# Patient Record
Sex: Male | Born: 1990
Health system: Southern US, Community
[De-identification: ages and names within clinical notes are randomized; demographics above are authoritative.]

## PROBLEM LIST (undated history)

## (undated) DIAGNOSIS — E785 Hyperlipidemia, unspecified: Secondary | ICD-10-CM

## (undated) DIAGNOSIS — Z7982 Long term (current) use of aspirin: Secondary | ICD-10-CM

## (undated) DIAGNOSIS — E119 Type 2 diabetes mellitus without complications: Secondary | ICD-10-CM

## (undated) DIAGNOSIS — J45909 Unspecified asthma, uncomplicated: Secondary | ICD-10-CM

## (undated) DIAGNOSIS — I213 ST elevation (STEMI) myocardial infarction of unspecified site: Secondary | ICD-10-CM

## (undated) DIAGNOSIS — I514 Myocarditis, unspecified: Secondary | ICD-10-CM

## (undated) DIAGNOSIS — I428 Other cardiomyopathies: Secondary | ICD-10-CM

## (undated) DIAGNOSIS — N184 Chronic kidney disease, stage 4 (severe): Secondary | ICD-10-CM

## (undated) DIAGNOSIS — I5032 Chronic diastolic (congestive) heart failure: Secondary | ICD-10-CM

## (undated) DIAGNOSIS — D509 Iron deficiency anemia, unspecified: Secondary | ICD-10-CM

## (undated) DIAGNOSIS — I4510 Unspecified right bundle-branch block: Secondary | ICD-10-CM

## (undated) DIAGNOSIS — I5020 Unspecified systolic (congestive) heart failure: Secondary | ICD-10-CM

## (undated) DIAGNOSIS — Z6841 Body Mass Index (BMI) 40.0 and over, adult: Secondary | ICD-10-CM

## (undated) DIAGNOSIS — N182 Chronic kidney disease, stage 2 (mild): Secondary | ICD-10-CM

## (undated) DIAGNOSIS — I472 Ventricular tachycardia, unspecified: Secondary | ICD-10-CM

## (undated) DIAGNOSIS — I2510 Atherosclerotic heart disease of native coronary artery without angina pectoris: Secondary | ICD-10-CM

## (undated) DIAGNOSIS — I10 Essential (primary) hypertension: Secondary | ICD-10-CM

## (undated) DIAGNOSIS — Z8709 Personal history of other diseases of the respiratory system: Secondary | ICD-10-CM

## (undated) HISTORY — DX: Chronic diastolic (congestive) heart failure: I50.32

## (undated) HISTORY — DX: Body Mass Index (BMI) 40.0 and over, adult: Z684

## (undated) HISTORY — DX: Atherosclerotic heart disease of native coronary artery without angina pectoris: I25.10

## (undated) HISTORY — PX: CARDIAC CATHETERIZATION: SHX172

## (undated) HISTORY — DX: Unspecified systolic (congestive) heart failure: I50.20

## (undated) HISTORY — DX: Ventricular tachycardia, unspecified: I47.20

## (undated) HISTORY — DX: Iron deficiency anemia, unspecified: D50.9

## (undated) HISTORY — DX: Other cardiomyopathies: I42.8

## (undated) HISTORY — DX: Chronic kidney disease, stage 2 (mild): N18.2

## (undated) HISTORY — DX: Chronic kidney disease, stage 4 (severe): N18.4

## (undated) HISTORY — DX: Morbid (severe) obesity due to excess calories: E66.01

## (undated) HISTORY — DX: Myocarditis, unspecified: I51.4

## (undated) HISTORY — DX: Personal history of other diseases of the respiratory system: Z87.09

---
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
## 2021-01-12 ENCOUNTER — Encounter: Payer: Self-pay | Admitting: Emergency Medicine

## 2021-01-12 ENCOUNTER — Inpatient Hospital Stay: Payer: HRSA Program

## 2021-01-12 ENCOUNTER — Emergency Department: Payer: HRSA Program

## 2021-01-12 ENCOUNTER — Inpatient Hospital Stay
Admission: EM | Admit: 2021-01-12 | Discharge: 2021-02-03 | DRG: 207 | Disposition: A | Discharge status: Rehabilitation Facility | Payer: HRSA Program | Attending: Internal Medicine | Admitting: Internal Medicine

## 2021-01-12 DIAGNOSIS — J15211 Pneumonia due to Methicillin susceptible Staphylococcus aureus: Secondary | ICD-10-CM | POA: Diagnosis present

## 2021-01-12 DIAGNOSIS — R7989 Other specified abnormal findings of blood chemistry: Secondary | ICD-10-CM

## 2021-01-12 DIAGNOSIS — R34 Anuria and oliguria: Secondary | ICD-10-CM | POA: Diagnosis not present

## 2021-01-12 DIAGNOSIS — E870 Hyperosmolality and hypernatremia: Secondary | ICD-10-CM | POA: Diagnosis present

## 2021-01-12 DIAGNOSIS — J1282 Pneumonia due to coronavirus disease 2019: Secondary | ICD-10-CM | POA: Diagnosis present

## 2021-01-12 DIAGNOSIS — E66813 Obesity, class 3: Secondary | ICD-10-CM | POA: Diagnosis present

## 2021-01-12 DIAGNOSIS — I2119 ST elevation (STEMI) myocardial infarction involving other coronary artery of inferior wall: Secondary | ICD-10-CM | POA: Diagnosis not present

## 2021-01-12 DIAGNOSIS — D649 Anemia, unspecified: Secondary | ICD-10-CM | POA: Diagnosis not present

## 2021-01-12 DIAGNOSIS — E1110 Type 2 diabetes mellitus with ketoacidosis without coma: Secondary | ICD-10-CM | POA: Diagnosis present

## 2021-01-12 DIAGNOSIS — I472 Ventricular tachycardia, unspecified: Secondary | ICD-10-CM

## 2021-01-12 DIAGNOSIS — U071 COVID-19: Principal | ICD-10-CM | POA: Diagnosis present

## 2021-01-12 DIAGNOSIS — K567 Ileus, unspecified: Secondary | ICD-10-CM

## 2021-01-12 DIAGNOSIS — J80 Acute respiratory distress syndrome: Secondary | ICD-10-CM | POA: Diagnosis present

## 2021-01-12 DIAGNOSIS — G9341 Metabolic encephalopathy: Secondary | ICD-10-CM | POA: Diagnosis present

## 2021-01-12 DIAGNOSIS — I5031 Acute diastolic (congestive) heart failure: Secondary | ICD-10-CM | POA: Diagnosis present

## 2021-01-12 DIAGNOSIS — J9690 Respiratory failure, unspecified, unspecified whether with hypoxia or hypercapnia: Secondary | ICD-10-CM

## 2021-01-12 DIAGNOSIS — Z452 Encounter for adjustment and management of vascular access device: Secondary | ICD-10-CM

## 2021-01-12 DIAGNOSIS — E785 Hyperlipidemia, unspecified: Secondary | ICD-10-CM | POA: Diagnosis present

## 2021-01-12 DIAGNOSIS — G928 Other toxic encephalopathy: Secondary | ICD-10-CM | POA: Diagnosis present

## 2021-01-12 DIAGNOSIS — E1100 Type 2 diabetes mellitus with hyperosmolarity without nonketotic hyperglycemic-hyperosmolar coma (NKHHC): Secondary | ICD-10-CM

## 2021-01-12 DIAGNOSIS — J45901 Unspecified asthma with (acute) exacerbation: Secondary | ICD-10-CM | POA: Diagnosis present

## 2021-01-12 DIAGNOSIS — J9601 Acute respiratory failure with hypoxia: Secondary | ICD-10-CM

## 2021-01-12 DIAGNOSIS — I213 ST elevation (STEMI) myocardial infarction of unspecified site: Secondary | ICD-10-CM

## 2021-01-12 DIAGNOSIS — A4189 Other specified sepsis: Secondary | ICD-10-CM | POA: Diagnosis present

## 2021-01-12 DIAGNOSIS — Z833 Family history of diabetes mellitus: Secondary | ICD-10-CM | POA: Diagnosis not present

## 2021-01-12 DIAGNOSIS — N179 Acute kidney failure, unspecified: Secondary | ICD-10-CM

## 2021-01-12 DIAGNOSIS — A4101 Sepsis due to Methicillin susceptible Staphylococcus aureus: Secondary | ICD-10-CM | POA: Diagnosis present

## 2021-01-12 DIAGNOSIS — E1111 Type 2 diabetes mellitus with ketoacidosis with coma: Secondary | ICD-10-CM

## 2021-01-12 DIAGNOSIS — R6521 Severe sepsis with septic shock: Secondary | ICD-10-CM | POA: Diagnosis present

## 2021-01-12 DIAGNOSIS — A419 Sepsis, unspecified organism: Secondary | ICD-10-CM

## 2021-01-12 DIAGNOSIS — Z4659 Encounter for fitting and adjustment of other gastrointestinal appliance and device: Secondary | ICD-10-CM

## 2021-01-12 DIAGNOSIS — R778 Other specified abnormalities of plasma proteins: Secondary | ICD-10-CM

## 2021-01-12 DIAGNOSIS — E1010 Type 1 diabetes mellitus with ketoacidosis without coma: Secondary | ICD-10-CM | POA: Diagnosis present

## 2021-01-12 DIAGNOSIS — E669 Obesity, unspecified: Secondary | ICD-10-CM

## 2021-01-12 DIAGNOSIS — Z6841 Body Mass Index (BMI) 40.0 and over, adult: Secondary | ICD-10-CM | POA: Diagnosis not present

## 2021-01-12 DIAGNOSIS — R269 Unspecified abnormalities of gait and mobility: Secondary | ICD-10-CM | POA: Diagnosis not present

## 2021-01-12 DIAGNOSIS — E876 Hypokalemia: Secondary | ICD-10-CM | POA: Diagnosis not present

## 2021-01-12 DIAGNOSIS — E1165 Type 2 diabetes mellitus with hyperglycemia: Secondary | ICD-10-CM

## 2021-01-12 DIAGNOSIS — R1915 Other abnormal bowel sounds: Secondary | ICD-10-CM

## 2021-01-12 DIAGNOSIS — E0811 Diabetes mellitus due to underlying condition with ketoacidosis with coma: Secondary | ICD-10-CM

## 2021-01-12 HISTORY — DX: Unspecified asthma, uncomplicated: J45.909

## 2021-01-12 HISTORY — DX: COVID-19: U07.1

## 2021-01-12 LAB — BASIC METABOLIC PANEL
Anion gap: 28 — ABNORMAL HIGH (ref 5–15)
Anion gap: 27 — ABNORMAL HIGH (ref 5–15)
Anion gap: 15 (ref 5–15)
BUN: 79 mg/dL — ABNORMAL HIGH (ref 6–20)
BUN: 78 mg/dL — ABNORMAL HIGH (ref 6–20)
BUN: 75 mg/dL — ABNORMAL HIGH (ref 6–20)
CO2: 16 mmol/L — ABNORMAL LOW (ref 22–32)
CO2: 17 mmol/L — ABNORMAL LOW (ref 22–32)
CO2: 24 mmol/L (ref 22–32)
Calcium: 8.7 mg/dL — ABNORMAL LOW (ref 8.9–10.3)
Calcium: 8.4 mg/dL — ABNORMAL LOW (ref 8.9–10.3)
Calcium: 8.2 mg/dL — ABNORMAL LOW (ref 8.9–10.3)
Chloride: 112 mmol/L — ABNORMAL HIGH (ref 98–111)
Chloride: 111 mmol/L (ref 98–111)
Chloride: 119 mmol/L — ABNORMAL HIGH (ref 98–111)
Creatinine, Ser: 3.11 mg/dL — ABNORMAL HIGH (ref 0.61–1.24)
Creatinine, Ser: 3.18 mg/dL — ABNORMAL HIGH (ref 0.61–1.24)
Creatinine, Ser: 3.42 mg/dL — ABNORMAL HIGH (ref 0.61–1.24)
GFR, Estimated: 27 mL/min — ABNORMAL LOW (ref >60)
GFR, Estimated: 26 mL/min — ABNORMAL LOW (ref >60)
GFR, Estimated: 24 mL/min — ABNORMAL LOW (ref >60)
Glucose, Bld: 790 mg/dL (ref 70–99)
Glucose, Bld: 755 mg/dL (ref 70–99)
Glucose, Bld: 336 mg/dL — ABNORMAL HIGH (ref 70–99)
Potassium: 5.8 mmol/L — ABNORMAL HIGH (ref 3.5–5.1)
Potassium: 5.3 mmol/L — ABNORMAL HIGH (ref 3.5–5.1)
Potassium: 4.6 mmol/L (ref 3.5–5.1)
Sodium: 156 mmol/L — ABNORMAL HIGH (ref 135–145)
Sodium: 155 mmol/L — ABNORMAL HIGH (ref 135–145)
Sodium: 158 mmol/L — ABNORMAL HIGH (ref 135–145)

## 2021-01-12 LAB — CBC WITH DIFFERENTIAL/PLATELET
Abs Immature Granulocytes: 0.13 10*3/uL — ABNORMAL HIGH (ref 0.00–0.07)
Abs Immature Granulocytes: 0.15 10*3/uL — ABNORMAL HIGH (ref 0.00–0.07)
Basophils Absolute: 0 10*3/uL (ref 0.0–0.1)
Basophils Absolute: 0 10*3/uL (ref 0.0–0.1)
Basophils Relative: 0 %
Basophils Relative: 0 %
Eosinophils Absolute: 0 10*3/uL (ref 0.0–0.5)
Eosinophils Absolute: 0 10*3/uL (ref 0.0–0.5)
Eosinophils Relative: 0 %
Eosinophils Relative: 0 %
HCT: 53.9 % — ABNORMAL HIGH (ref 39.0–52.0)
HCT: 53.5 % — ABNORMAL HIGH (ref 39.0–52.0)
Hemoglobin: 15.8 g/dL (ref 13.0–17.0)
Hemoglobin: 15.3 g/dL (ref 13.0–17.0)
Immature Granulocytes: 1 %
Immature Granulocytes: 1 %
Lymphocytes Relative: 6 %
Lymphocytes Relative: 5 %
Lymphs Abs: 1 10*3/uL (ref 0.7–4.0)
Lymphs Abs: 0.9 10*3/uL (ref 0.7–4.0)
MCH: 25.5 pg — ABNORMAL LOW (ref 26.0–34.0)
MCH: 25.4 pg — ABNORMAL LOW (ref 26.0–34.0)
MCHC: 29.3 g/dL — ABNORMAL LOW (ref 30.0–36.0)
MCHC: 28.6 g/dL — ABNORMAL LOW (ref 30.0–36.0)
MCV: 87.1 fL (ref 80.0–100.0)
MCV: 88.9 fL (ref 80.0–100.0)
Monocytes Absolute: 2.1 10*3/uL — ABNORMAL HIGH (ref 0.1–1.0)
Monocytes Absolute: 2 10*3/uL — ABNORMAL HIGH (ref 0.1–1.0)
Monocytes Relative: 12 %
Monocytes Relative: 11 %
Neutro Abs: 14.1 10*3/uL — ABNORMAL HIGH (ref 1.7–7.7)
Neutro Abs: 14.9 10*3/uL — ABNORMAL HIGH (ref 1.7–7.7)
Neutrophils Relative %: 81 %
Neutrophils Relative %: 83 %
Platelets: 447 10*3/uL — ABNORMAL HIGH (ref 150–400)
Platelets: 441 10*3/uL — ABNORMAL HIGH (ref 150–400)
RBC: 6.19 MIL/uL — ABNORMAL HIGH (ref 4.22–5.81)
RBC: 6.02 MIL/uL — ABNORMAL HIGH (ref 4.22–5.81)
RDW: 13.8 % (ref 11.5–15.5)
RDW: 13.9 % (ref 11.5–15.5)
WBC: 17.3 10*3/uL — ABNORMAL HIGH (ref 4.0–10.5)
WBC: 17.9 10*3/uL — ABNORMAL HIGH (ref 4.0–10.5)
nRBC: 0 % (ref 0.0–0.2)
nRBC: 0 % (ref 0.0–0.2)

## 2021-01-12 LAB — COMPREHENSIVE METABOLIC PANEL
ALT: 14 U/L (ref 0–44)
AST: 18 U/L (ref 15–41)
Albumin: 2.5 g/dL — ABNORMAL LOW (ref 3.5–5.0)
Alkaline Phosphatase: 78 U/L (ref 38–126)
Anion gap: 29 — ABNORMAL HIGH (ref 5–15)
BUN: 74 mg/dL — ABNORMAL HIGH (ref 6–20)
CO2: 15 mmol/L — ABNORMAL LOW (ref 22–32)
Calcium: 8.6 mg/dL — ABNORMAL LOW (ref 8.9–10.3)
Chloride: 108 mmol/L (ref 98–111)
Creatinine, Ser: 3.06 mg/dL — ABNORMAL HIGH (ref 0.61–1.24)
GFR, Estimated: 27 mL/min — ABNORMAL LOW (ref >60)
Glucose, Bld: 802 mg/dL (ref 70–99)
Potassium: 5.7 mmol/L — ABNORMAL HIGH (ref 3.5–5.1)
Sodium: 152 mmol/L — ABNORMAL HIGH (ref 135–145)
Total Bilirubin: 1.8 mg/dL — ABNORMAL HIGH (ref 0.3–1.2)
Total Protein: 7.5 g/dL (ref 6.5–8.1)

## 2021-01-12 LAB — GLUCOSE, CAPILLARY
Glucose-Capillary: 402 mg/dL — ABNORMAL HIGH (ref 70–99)
Glucose-Capillary: 432 mg/dL — ABNORMAL HIGH (ref 70–99)
Glucose-Capillary: 375 mg/dL — ABNORMAL HIGH (ref 70–99)
Glucose-Capillary: 444 mg/dL — ABNORMAL HIGH (ref 70–99)
Glucose-Capillary: 324 mg/dL — ABNORMAL HIGH (ref 70–99)
Glucose-Capillary: 274 mg/dL — ABNORMAL HIGH (ref 70–99)
Glucose-Capillary: 229 mg/dL — ABNORMAL HIGH (ref 70–99)
Glucose-Capillary: 230 mg/dL — ABNORMAL HIGH (ref 70–99)

## 2021-01-12 LAB — BLOOD GAS, ARTERIAL
Acid-base deficit: 3.2 mmol/L — ABNORMAL HIGH (ref 0.0–2.0)
Bicarbonate: 19.9 mmol/L — ABNORMAL LOW (ref 20.0–28.0)
FIO2: 0.8
MECHVT: 550 mL
O2 Saturation: 90.9 %
PEEP: 5 cmH2O
Patient temperature: 37
RATE: 25 resp/min
pCO2 arterial: 30 mmHg — ABNORMAL LOW (ref 32.0–48.0)
pH, Arterial: 7.43 (ref 7.350–7.450)
pO2, Arterial: 59 mmHg — ABNORMAL LOW (ref 83.0–108.0)

## 2021-01-12 LAB — CBG MONITORING, ED
Glucose-Capillary: >600 mg/dL (ref 70–99)
Glucose-Capillary: >600 mg/dL (ref 70–99)
Glucose-Capillary: >600 mg/dL (ref 70–99)
Glucose-Capillary: >600 mg/dL (ref 70–99)
Glucose-Capillary: >600 mg/dL (ref 70–99)
Glucose-Capillary: >600 mg/dL (ref 70–99)
Glucose-Capillary: 502 mg/dL (ref 70–99)
Glucose-Capillary: 523 mg/dL (ref 70–99)

## 2021-01-12 LAB — LACTIC ACID, PLASMA
Lactic Acid, Venous: 3.3 mmol/L (ref 0.5–1.9)
Lactic Acid, Venous: 3.6 mmol/L (ref 0.5–1.9)
Lactic Acid, Venous: 3.9 mmol/L (ref 0.5–1.9)

## 2021-01-12 LAB — C-REACTIVE PROTEIN: CRP: 11.1 mg/dL — ABNORMAL HIGH (ref <1.0)

## 2021-01-12 LAB — PROTIME-INR
INR: 1.2 (ref 0.8–1.2)
Prothrombin Time: 14.3 seconds (ref 11.4–15.2)

## 2021-01-12 LAB — HEMOGLOBIN A1C
Hgb A1c MFr Bld: 13.6 % — ABNORMAL HIGH (ref 4.8–5.6)
Mean Plasma Glucose: 343.62 mg/dL

## 2021-01-12 LAB — BLOOD GAS, VENOUS
Acid-base deficit: 12.7 mmol/L — ABNORMAL HIGH (ref 0.0–2.0)
Bicarbonate: 13.9 mmol/L — ABNORMAL LOW (ref 20.0–28.0)
O2 Saturation: 84.6 %
Patient temperature: 37
pCO2, Ven: 34 mmHg — ABNORMAL LOW (ref 44.0–60.0)
pH, Ven: 7.22 — ABNORMAL LOW (ref 7.250–7.430)
pO2, Ven: 60 mmHg — ABNORMAL HIGH (ref 32.0–45.0)

## 2021-01-12 LAB — OSMOLALITY
Osmolality: 404 mOsm/kg (ref 275–295)
Osmolality: 402 mOsm/kg (ref 275–295)

## 2021-01-12 LAB — POC SARS CORONAVIRUS 2 AG -  ED: SARS Coronavirus 2 Ag: NEGATIVE

## 2021-01-12 LAB — BETA-HYDROXYBUTYRIC ACID
Beta-Hydroxybutyric Acid: 6.49 mmol/L — ABNORMAL HIGH (ref 0.05–0.27)
Beta-Hydroxybutyric Acid: 0.47 mmol/L — ABNORMAL HIGH (ref 0.05–0.27)

## 2021-01-12 LAB — SARS CORONAVIRUS 2 BY RT PCR (HOSPITAL ORDER, PERFORMED IN ~~LOC~~ HOSPITAL LAB): SARS Coronavirus 2: POSITIVE — AB

## 2021-01-12 LAB — MRSA PCR SCREENING: MRSA by PCR: NEGATIVE

## 2021-01-12 LAB — PROCALCITONIN: Procalcitonin: 0.73 ng/mL

## 2021-01-12 LAB — APTT: aPTT: 28 seconds (ref 24–36)

## 2021-01-12 LAB — FIBRIN DERIVATIVES D-DIMER (ARMC ONLY): Fibrin derivatives D-dimer (ARMC): 771.18 ng/mL (FEU) — ABNORMAL HIGH (ref 0.00–499.00)

## 2021-01-12 MED ORDER — INSULIN ASPART 100 UNIT/ML ~~LOC~~ SOLN
20.0000 [IU] | Freq: Once | SUBCUTANEOUS | Status: AC
  Administered 2021-01-12: 20 [IU] via SUBCUTANEOUS
  Filled 2021-01-12: qty 1

## 2021-01-12 MED ORDER — ONDANSETRON HCL 4 MG/2ML IJ SOLN
4.0000 mg | Freq: Four times a day (QID) | INTRAMUSCULAR | Status: DC | PRN
  Administered 2021-01-12: 4 mg via INTRAVENOUS
  Filled 2021-01-12: qty 2

## 2021-01-12 MED ORDER — HYDROCOD POLST-CPM POLST ER 10-8 MG/5ML PO SUER: 5.0000 mL | Freq: Two times a day (BID) | ORAL | Status: DC | PRN

## 2021-01-12 MED ORDER — INSULIN REGULAR(HUMAN) IN NACL 100-0.9 UT/100ML-% IV SOLN
INTRAVENOUS | Status: DC
  Administered 2021-01-12: 15 [IU]/h via INTRAVENOUS

## 2021-01-12 MED ORDER — ETOMIDATE 2 MG/ML IV SOLN
40.0000 mg | Freq: Once | INTRAVENOUS | Status: AC
  Administered 2021-01-12: 40 mg via INTRAVENOUS

## 2021-01-12 MED ORDER — ONDANSETRON HCL 4 MG/2ML IJ SOLN
4.0000 mg | Freq: Four times a day (QID) | INTRAMUSCULAR | Status: DC | PRN
  Administered 2021-01-18: 4 mg via INTRAVENOUS

## 2021-01-12 MED ORDER — GUAIFENESIN-DM 100-10 MG/5ML PO SYRP: 10.0000 mL | ORAL_SOLUTION | ORAL | Status: DC | PRN

## 2021-01-12 MED ORDER — SODIUM CHLORIDE 0.9 % IV SOLN: 1.0000 g | INTRAVENOUS | Status: DC

## 2021-01-12 MED ORDER — ZINC SULFATE 220 (50 ZN) MG PO CAPS
220.0000 mg | ORAL_CAPSULE | Freq: Every day | ORAL | Status: DC
  Filled 2021-01-12: qty 1

## 2021-01-12 MED ORDER — INSULIN REGULAR BOLUS VIA INFUSION
20.0000 [IU] | Freq: Once | INTRAVENOUS | Status: DC
  Filled 2021-01-12: qty 20

## 2021-01-12 MED ORDER — DOCUSATE SODIUM 50 MG/5ML PO LIQD
100.0000 mg | Freq: Two times a day (BID) | ORAL | Status: DC
  Administered 2021-01-12 – 2021-01-19 (×6): 100 mg
  Filled 2021-01-12 (×11): qty 10

## 2021-01-12 MED ORDER — PROPOFOL 1000 MG/100ML IV EMUL
INTRAVENOUS | Status: AC
  Administered 2021-01-12: 10 ug/kg/min via INTRAVENOUS
  Filled 2021-01-12: qty 100

## 2021-01-12 MED ORDER — POLYETHYLENE GLYCOL 3350 17 G PO PACK
17.0000 g | PACK | Freq: Every day | ORAL | Status: DC
  Administered 2021-01-13 – 2021-01-21 (×5): 17 g
  Filled 2021-01-12 (×5): qty 1

## 2021-01-12 MED ORDER — DEXTROSE IN LACTATED RINGERS 5 % IV SOLN: INTRAVENOUS | Status: DC

## 2021-01-12 MED ORDER — ASCORBIC ACID 500 MG PO TABS
500.0000 mg | ORAL_TABLET | Freq: Every day | ORAL | Status: DC
  Administered 2021-01-12: 500 mg via ORAL
  Filled 2021-01-12: qty 1

## 2021-01-12 MED ORDER — ONDANSETRON HCL 4 MG PO TABS
4.0000 mg | ORAL_TABLET | Freq: Four times a day (QID) | ORAL | Status: DC | PRN
Start: 2021-01-12 — End: 2021-02-03

## 2021-01-12 MED ORDER — ACETAMINOPHEN 325 MG PO TABS: 325.0000 mg | ORAL_TABLET | Freq: Four times a day (QID) | ORAL | Status: AC | PRN

## 2021-01-12 MED ORDER — FAMOTIDINE IN NACL 20-0.9 MG/50ML-% IV SOLN
20.0000 mg | Freq: Two times a day (BID) | INTRAVENOUS | Status: DC
  Filled 2021-01-12: qty 50

## 2021-01-12 MED ORDER — FENTANYL BOLUS VIA INFUSION
50.0000 ug | INTRAVENOUS | Status: DC | PRN
  Administered 2021-01-14 – 2021-01-20 (×6): 50 ug via INTRAVENOUS
  Filled 2021-01-12: qty 50

## 2021-01-12 MED ORDER — MIDAZOLAM HCL 2 MG/2ML IJ SOLN
2.0000 mg | INTRAMUSCULAR | Status: DC | PRN
  Administered 2021-01-14: 16:00:00 2 mg via INTRAVENOUS
  Filled 2021-01-12 (×3): qty 2

## 2021-01-12 MED ORDER — DEXAMETHASONE SODIUM PHOSPHATE 10 MG/ML IJ SOLN
6.0000 mg | INTRAMUSCULAR | Status: DC
  Filled 2021-01-12: qty 0.6

## 2021-01-12 MED ORDER — DEXTROSE 50 % IV SOLN: 0.0000 mL | INTRAVENOUS | Status: DC | PRN

## 2021-01-12 MED ORDER — SODIUM CHLORIDE 0.9 % IV SOLN
250.0000 mL | INTRAVENOUS | Status: DC | PRN
  Administered 2021-01-17 – 2021-01-19 (×3): 250 mL via INTRAVENOUS

## 2021-01-12 MED ORDER — SODIUM CHLORIDE 0.9% FLUSH: 3.0000 mL | INTRAVENOUS | Status: DC | PRN

## 2021-01-12 MED ORDER — METHYLPREDNISOLONE SODIUM SUCC 40 MG IJ SOLR
40.0000 mg | Freq: Two times a day (BID) | INTRAMUSCULAR | Status: DC
  Administered 2021-01-12 – 2021-01-17 (×11): 40 mg via INTRAVENOUS
  Filled 2021-01-12 (×12): qty 1

## 2021-01-12 MED ORDER — LACTATED RINGERS IV BOLUS: 3000.0000 mL | Freq: Once | INTRAVENOUS | Status: DC

## 2021-01-12 MED ORDER — INSULIN REGULAR HUMAN 100 UNIT/ML IJ SOLN: 20.0000 [IU] | INTRAMUSCULAR | Status: DC

## 2021-01-12 MED ORDER — SODIUM CHLORIDE 0.9 % IV SOLN: 100.0000 mg | Freq: Every day | INTRAVENOUS | Status: DC

## 2021-01-12 MED ORDER — LACTATED RINGERS IV SOLN: INTRAVENOUS | Status: DC

## 2021-01-12 MED ORDER — FREE WATER
200.0000 mL | Status: DC
  Administered 2021-01-13 (×2): 200 mL

## 2021-01-12 MED ORDER — SODIUM CHLORIDE 0.9 % IV SOLN: Freq: Once | INTRAVENOUS | Status: AC

## 2021-01-12 MED ORDER — FENTANYL 2500MCG IN NS 250ML (10MCG/ML) PREMIX INFUSION
0.0000 ug/h | INTRAVENOUS | Status: DC
  Administered 2021-01-12: 50 ug/h via INTRAVENOUS
  Administered 2021-01-13 – 2021-01-15 (×6): 200 ug/h via INTRAVENOUS
  Administered 2021-01-16: 20:00:00 350 ug/h via INTRAVENOUS
  Administered 2021-01-16: 03:00:00 200 ug/h via INTRAVENOUS
  Administered 2021-01-16: 350 ug/h via INTRAVENOUS
  Administered 2021-01-17: 19:00:00 325 ug/h via INTRAVENOUS
  Administered 2021-01-17 – 2021-01-18 (×5): 350 ug/h via INTRAVENOUS
  Administered 2021-01-19: 325 ug/h via INTRAVENOUS
  Administered 2021-01-19: 12:00:00 200 ug/h via INTRAVENOUS
  Administered 2021-01-19: 325 ug/h via INTRAVENOUS
  Administered 2021-01-20: 350 ug/h via INTRAVENOUS
  Administered 2021-01-20: 300 ug/h via INTRAVENOUS
  Administered 2021-01-20: 325 ug/h via INTRAVENOUS
  Administered 2021-01-21 – 2021-01-22 (×5): 350 ug/h via INTRAVENOUS
  Administered 2021-01-23: 300 ug/h via INTRAVENOUS
  Administered 2021-01-23: 350 ug/h via INTRAVENOUS
  Filled 2021-01-12 (×30): qty 250

## 2021-01-12 MED ORDER — ONDANSETRON HCL 4 MG PO TABS: 4.0000 mg | ORAL_TABLET | Freq: Four times a day (QID) | ORAL | Status: DC | PRN

## 2021-01-12 MED ORDER — HEPARIN SODIUM (PORCINE) 5000 UNIT/ML IJ SOLN
5000.0000 [IU] | Freq: Three times a day (TID) | INTRAMUSCULAR | Status: DC
  Administered 2021-01-12: 5000 [IU] via SUBCUTANEOUS
  Filled 2021-01-12: qty 1

## 2021-01-12 MED ORDER — ORAL CARE MOUTH RINSE
15.0000 mL | OROMUCOSAL | Status: DC
  Administered 2021-01-12 – 2021-01-23 (×104): 15 mL via OROMUCOSAL

## 2021-01-12 MED ORDER — SODIUM CHLORIDE 0.9% FLUSH
3.0000 mL | Freq: Two times a day (BID) | INTRAVENOUS | Status: DC
  Administered 2021-01-12 – 2021-01-22 (×20): 3 mL via INTRAVENOUS

## 2021-01-12 MED ORDER — LACTATED RINGERS IV BOLUS
1000.0000 mL | Freq: Once | INTRAVENOUS | Status: AC
  Administered 2021-01-12: 1000 mL via INTRAVENOUS

## 2021-01-12 MED ORDER — METHYLPREDNISOLONE SODIUM SUCC 1000 MG IJ SOLR
1.0000 mg/kg | Freq: Two times a day (BID) | INTRAMUSCULAR | Status: DC
  Filled 2021-01-12 (×3): qty 1.28

## 2021-01-12 MED ORDER — SODIUM CHLORIDE 0.9 % IV SOLN
500.0000 mg | INTRAVENOUS | Status: DC
  Administered 2021-01-12: 500 mg via INTRAVENOUS
  Filled 2021-01-12: qty 500

## 2021-01-12 MED ORDER — CHLORHEXIDINE GLUCONATE 0.12% ORAL RINSE (MEDLINE KIT)
15.0000 mL | Freq: Two times a day (BID) | OROMUCOSAL | Status: DC
  Administered 2021-01-12 – 2021-01-23 (×21): 15 mL via OROMUCOSAL

## 2021-01-12 MED ORDER — ROCURONIUM BROMIDE 50 MG/5ML IV SOLN
150.0000 mg | Freq: Once | INTRAVENOUS | Status: AC
  Administered 2021-01-12: 150 mg via INTRAVENOUS
  Filled 2021-01-12: qty 15

## 2021-01-12 MED ORDER — ACETAMINOPHEN 325 MG PO TABS
650.0000 mg | ORAL_TABLET | ORAL | Status: DC | PRN
  Administered 2021-01-12 – 2021-01-14 (×5): 650 mg via ORAL
  Filled 2021-01-12 (×5): qty 2

## 2021-01-12 MED ORDER — HEPARIN SODIUM (PORCINE) 5000 UNIT/ML IJ SOLN: 5000.0000 [IU] | Freq: Three times a day (TID) | INTRAMUSCULAR | Status: DC

## 2021-01-12 MED ORDER — DOCUSATE SODIUM 100 MG PO CAPS
100.0000 mg | ORAL_CAPSULE | Freq: Two times a day (BID) | ORAL | Status: DC | PRN
  Administered 2021-01-20: 100 mg via ORAL
  Filled 2021-01-12: qty 1

## 2021-01-12 MED ORDER — FAMOTIDINE IN NACL 20-0.9 MG/50ML-% IV SOLN
20.0000 mg | Freq: Two times a day (BID) | INTRAVENOUS | Status: DC
  Administered 2021-01-12 – 2021-01-14 (×4): 20 mg via INTRAVENOUS
  Filled 2021-01-12 (×5): qty 50

## 2021-01-12 MED ORDER — CHLORHEXIDINE GLUCONATE CLOTH 2 % EX PADS
6.0000 | MEDICATED_PAD | Freq: Every day | CUTANEOUS | Status: DC
  Administered 2021-01-13 – 2021-02-03 (×21): 6 via TOPICAL

## 2021-01-12 MED ORDER — FENTANYL CITRATE (PF) 100 MCG/2ML IJ SOLN
50.0000 ug | Freq: Once | INTRAMUSCULAR | Status: AC
  Administered 2021-01-12: 50 ug via INTRAVENOUS

## 2021-01-12 MED ORDER — INSULIN REGULAR(HUMAN) IN NACL 100-0.9 UT/100ML-% IV SOLN
INTRAVENOUS | Status: DC
  Administered 2021-01-12: 5 [IU]/h via INTRAVENOUS
  Filled 2021-01-12: qty 100

## 2021-01-12 MED ORDER — SODIUM CHLORIDE 0.9 % IV SOLN
2.0000 g | INTRAVENOUS | Status: AC
  Administered 2021-01-13 – 2021-01-16 (×4): 2 g via INTRAVENOUS
  Filled 2021-01-12 (×4): qty 2

## 2021-01-12 MED ORDER — DEXTROSE 50 % IV SOLN
0.0000 mL | INTRAVENOUS | Status: DC | PRN
Start: 2021-01-12 — End: 2021-01-12

## 2021-01-12 MED ORDER — INSULIN REGULAR(HUMAN) IN NACL 100-0.9 UT/100ML-% IV SOLN: INTRAVENOUS | Status: DC

## 2021-01-12 MED ORDER — SODIUM CHLORIDE 0.9 % IV SOLN
200.0000 mg | Freq: Once | INTRAVENOUS | Status: AC
  Administered 2021-01-12: 200 mg via INTRAVENOUS
  Filled 2021-01-12: qty 200

## 2021-01-12 MED ORDER — POLYETHYLENE GLYCOL 3350 17 G PO PACK: 17.0000 g | PACK | Freq: Every day | ORAL | Status: DC | PRN

## 2021-01-12 MED ORDER — ACETAMINOPHEN 325 MG PO TABS: 325.0000 mg | ORAL_TABLET | Freq: Four times a day (QID) | ORAL | Status: DC | PRN

## 2021-01-12 MED ORDER — PREDNISONE 20 MG PO TABS: 50.0000 mg | ORAL_TABLET | Freq: Every day | ORAL | Status: DC

## 2021-01-12 MED ORDER — INSULIN REGULAR(HUMAN) IN NACL 100-0.9 UT/100ML-% IV SOLN
INTRAVENOUS | Status: DC
  Administered 2021-01-13: 4.8 [IU]/h via INTRAVENOUS
  Administered 2021-01-13: 15 [IU]/h via INTRAVENOUS
  Administered 2021-01-14: 13 [IU]/h via INTRAVENOUS
  Administered 2021-01-14: 10 [IU]/h via INTRAVENOUS
  Administered 2021-01-14: 14 [IU]/h via INTRAVENOUS
  Administered 2021-01-15: 15 [IU]/h via INTRAVENOUS
  Filled 2021-01-12 (×8): qty 100

## 2021-01-12 MED ORDER — PROPOFOL 1000 MG/100ML IV EMUL
0.0000 ug/kg/min | INTRAVENOUS | Status: DC
  Administered 2021-01-12 – 2021-01-13 (×4): 50 ug/kg/min via INTRAVENOUS
  Administered 2021-01-13: 35 ug/kg/min via INTRAVENOUS
  Administered 2021-01-13 (×2): 50 ug/kg/min via INTRAVENOUS
  Administered 2021-01-13: 40 ug/kg/min via INTRAVENOUS
  Administered 2021-01-13: 50 ug/kg/min via INTRAVENOUS
  Administered 2021-01-13: 35 ug/kg/min via INTRAVENOUS
  Administered 2021-01-13: 45 ug/kg/min via INTRAVENOUS
  Administered 2021-01-13 – 2021-01-14 (×3): 35 ug/kg/min via INTRAVENOUS
  Administered 2021-01-14: 45 ug/kg/min via INTRAVENOUS
  Administered 2021-01-14: 35 ug/kg/min via INTRAVENOUS
  Administered 2021-01-14: 50 ug/kg/min via INTRAVENOUS
  Administered 2021-01-14: 6 ug/kg/min via INTRAVENOUS
  Administered 2021-01-14: 35 ug/kg/min via INTRAVENOUS
  Administered 2021-01-14: 50 ug/kg/min via INTRAVENOUS
  Administered 2021-01-15 (×2): 45 ug/kg/min via INTRAVENOUS
  Administered 2021-01-15: 40 ug/kg/min via INTRAVENOUS
  Administered 2021-01-15 (×2): 45 ug/kg/min via INTRAVENOUS
  Administered 2021-01-15: 50 ug/kg/min via INTRAVENOUS
  Administered 2021-01-15 (×2): 45 ug/kg/min via INTRAVENOUS
  Administered 2021-01-16: 50 ug/kg/min via INTRAVENOUS
  Administered 2021-01-16: 40 ug/kg/min via INTRAVENOUS
  Administered 2021-01-16 (×2): 50 ug/kg/min via INTRAVENOUS
  Administered 2021-01-16: 10 ug/kg/min via INTRAVENOUS
  Administered 2021-01-16: 50 ug/kg/min via INTRAVENOUS
  Filled 2021-01-12 (×2): qty 100
  Filled 2021-01-12: qty 200
  Filled 2021-01-12 (×12): qty 100
  Filled 2021-01-12 (×2): qty 200
  Filled 2021-01-12 (×2): qty 100
  Filled 2021-01-12: qty 200
  Filled 2021-01-12 (×11): qty 100

## 2021-01-12 MED ORDER — NOREPINEPHRINE 4 MG/250ML-% IV SOLN
0.0000 ug/min | INTRAVENOUS | Status: DC
  Administered 2021-01-12: 2 ug/min via INTRAVENOUS

## 2021-01-12 MED ORDER — LACTATED RINGERS IV BOLUS
20.0000 mL/kg | Freq: Once | INTRAVENOUS | Status: AC
  Administered 2021-01-12: 1000 mL via INTRAVENOUS

## 2021-01-12 MED ORDER — SODIUM CHLORIDE 0.9 % IV SOLN
2.0000 g | INTRAVENOUS | Status: DC
  Administered 2021-01-12: 2 g via INTRAVENOUS
  Filled 2021-01-12: qty 20

## 2021-01-12 MED ORDER — ALBUTEROL SULFATE HFA 108 (90 BASE) MCG/ACT IN AERS
2.0000 | INHALATION_SPRAY | RESPIRATORY_TRACT | Status: DC
  Filled 2021-01-12: qty 6.7

## 2021-01-12 MED ORDER — SODIUM CHLORIDE 0.9 % IV SOLN
500.0000 mg | INTRAVENOUS | Status: AC
  Administered 2021-01-13 – 2021-01-16 (×4): 500 mg via INTRAVENOUS
  Filled 2021-01-12 (×3): qty 500
  Filled 2021-01-12: qty 131.58

## 2021-01-12 MED ORDER — MIDAZOLAM HCL 2 MG/2ML IJ SOLN: 2.0000 mg | INTRAMUSCULAR | Status: DC | PRN

## 2021-01-12 MED ORDER — ONDANSETRON HCL 4 MG/2ML IJ SOLN
4.0000 mg | Freq: Four times a day (QID) | INTRAMUSCULAR | Status: DC | PRN
  Administered 2021-01-24 – 2021-01-27 (×2): 4 mg via INTRAVENOUS
  Filled 2021-01-12 (×3): qty 2

## 2021-01-12 MED ORDER — LACTATED RINGERS IV BOLUS
3000.0000 mL | Freq: Once | INTRAVENOUS | Status: AC
  Administered 2021-01-12: 3000 mL via INTRAVENOUS

## 2021-01-12 MED ORDER — HEPARIN SODIUM (PORCINE) 5000 UNIT/ML IJ SOLN
5000.0000 [IU] | Freq: Three times a day (TID) | INTRAMUSCULAR | Status: DC
  Administered 2021-01-12 – 2021-01-19 (×22): 5000 [IU] via SUBCUTANEOUS
  Filled 2021-01-12 (×22): qty 1

## 2021-01-12 MED ORDER — DEXTROSE 50 % IV SOLN
0.0000 mL | INTRAVENOUS | Status: DC | PRN
  Filled 2021-01-12: qty 50

## 2021-01-12 NOTE — ED Notes (Signed)
fsbs 402

## 2021-01-12 NOTE — ED Notes (Signed)
No rate change of insulin per Endo tool

## 2021-01-12 NOTE — ED Notes (Signed)
CBG reading high, continue with 5units/hr per endo tool.
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
## 2021-01-12 NOTE — ED Notes (Signed)
Blood glucose per lab report is 790. Dr. Sedalia Muta aware.

## 2021-01-12 NOTE — ED Notes (Signed)
md in with pt again.  medsin fusing  Sinus tach on monitor.  og in place  Foley in place.

## 2021-01-12 NOTE — ED Notes (Signed)
Report called to sarah rn icu nurse 

## 2021-01-12 NOTE — ED Notes (Signed)
Per Stephaine RN, CBG at 1334  Was HH and endo tool ordered 5U which was no change

## 2021-01-12 NOTE — ED Notes (Signed)
Dr cox in room with pt.  Verbal order of insulin 15u/hr stat on insulin drip now.  Rate changed with robin rn to confirm

## 2021-01-12 NOTE — Progress Notes (Signed)
eLink Physician-Brief Progress Note Patient Name: WHITLEY STRYCHARZ DOB: January 13, 1991 MRN: [id number]   Date of Service  01/12/2021  HPI/Events of Note  NEW admit evaluation:  Notes, labs, meds reviewed.  30 yo morbidly obese male with ASTHMA now with progressive multiorgan failure with COVID 19 pneumonia and ARDS with severe acidosis and severe hypoxia with ARDS with acute renal failure. New onset DKA. AKI. Hypernatremia. Encephalopathy.  2/1 Emergently intubated earlier.  Camera: Febrile 101.  MAP 69, sinus tachy 115, 96% sats. Appear comfortable on Vent. Fent 200/propofol 50, Insuline 7 units/hr.  In synchrony on Vent. PIP < 30. No auto peep.  WBC 17.9 K, pro cal low 0.7, LA 3.6 PH 7.22 ( VBG).  CxR film seen: rotated film. ET in Place. Bi basal air space. Left > right. Hypernatremic. Cr 3.18,    eICU Interventions  - Lung protective ventilation, wean VT and rate as tolerated to prevent auto peep and barotrauma - VAP bundle. - BG goals < 180. Follow DKA  protocol on Insuline gtt - follow LA. - cytokine markers: 771. INR 1.2. on SQ Heparin for now. If worsening consider CTPA if Creatinine normalizes. - consider Baricitnib or Toci if worsening. ID help as needed. - on steroids/nebs/antibiotics. Anti virals.      Intervention Category Major Interventions: Respiratory failure - evaluation and management Evaluation Type: New Patient Evaluation  Ranee Gosselin 01/12/2021, 7:31 PM

## 2021-01-12 NOTE — ED Notes (Signed)
No change  Per END tool for insulin administration.

## 2021-01-12 NOTE — ED Notes (Signed)
fsbs 432

## 2021-01-12 NOTE — ED Notes (Signed)
Dr. Sedalia Muta at bedside trouble shooting Endo tool. Endo Too was changed to DKA, and still ordered 5 Units. Endo tool was ended and started again with the DKA. Mertie Moores RN at bedside and took report.

## 2021-01-12 NOTE — ED Provider Notes (Signed)
[facility] Emergency Department Provider Note    Event Date/Time   First MD Initiated Contact with Patient 01/12/21 [id number]     (approximate)  I have reviewed the triage vital signs and the nursing notes.   HISTORY  Chief Complaint Shortness of Breath and Hyperglycemia  Level V Caveat:  AMS - critically ill  HPI Erik [name] is a 30 y.o. male reported history of diabetes but does not see primary care doctor presents to the ER for several days of generalized malaise worsening shortness of breath fevers chills decreased p.o. intake.  Patient arrives ill-appearing hypoxic on room air down to the mid 80s requiring supplemental oxygen.  CBG 600.  Patient states he does not take insulin.  Does appear to be having coo small respirations.    Past Medical History:  Diagnosis Date  . Asthma    No family history on file.  There are no problems to display for this patient.     Prior to Admission medications   Not on File    Allergies Patient has no known allergies.    Social History Social History   Tobacco Use  . Smoking status: Never Smoker  . Smokeless tobacco: Never Used  Substance Use Topics  . Alcohol use: Never  . Drug use: Never    Review of Systems Patient denies headaches, rhinorrhea, blurry vision, numbness, shortness of breath, chest pain, edema, cough, abdominal pain, nausea, vomiting, diarrhea, dysuria, fevers, rashes or hallucinations unless otherwise stated above in HPI. ____________________________________________   PHYSICAL EXAM:  VITAL SIGNS: Vitals:   01/12/21 0923 01/12/21 1045  BP:  112/72  Pulse: (!) 124 (!) 125  Resp: (!) 27 (!) 30  Temp:    SpO2: 93% 94%    Constitutional: ill appearing, drowsy, protecting airway Eyes: Conjunctivae are normal.  Head: Atraumatic. Nose: No congestion/rhinnorhea. Mouth/Throat: Mucous membranes are dry.   Neck: No stridor. Painless ROM.  Cardiovascular: tachycardic rate,  regular rhythm. Grossly normal heart sounds.  Good peripheral circulation. Respiratory:  Tachypnea with diminished BS throughout 2/2 body habitus Gastrointestinal: Soft and nontender. No distention. No abdominal bruits. No CVA tenderness. Genitourinary: deferred Musculoskeletal: No lower extremity tenderness nor edema.  No joint effusions. Neurologic:   No gross focal neurologic deficits are appreciated. No facial droop Skin:  Skin is warm, dry and intact. No rash noted. Psychiatric: unable to assess  ____________________________________________   LABS (all labs ordered are listed, but only abnormal results are displayed)  Results for orders placed or performed during the hospital encounter of 01/12/21 (from the past 24 hour(s))  CBG monitoring, ED     Status: Abnormal   Collection Time: 01/12/21  8:28 AM  Result Value Ref Range   Glucose-Capillary >600 (HH) 70 - 99 mg/dL   Comment 1 Notify RN    Comment 2 Document in Chart   CBC with Differential     Status: Abnormal   Collection Time: 01/12/21  8:39 AM  Result Value Ref Range   WBC 17.3 (H) 4.0 - 10.5 K/uL   RBC 6.19 (H) 4.22 - 5.81 MIL/uL   Hemoglobin 15.8 13.0 - 17.0 g/dL   HCT 14.4 (H) 81.8 - 56.3 %   MCV 87.1 80.0 - 100.0 fL   MCH 25.5 (L) 26.0 - 34.0 pg   MCHC 29.3 (L) 30.0 - 36.0 g/dL   RDW 14.9 70.2 - 63.7 %   Platelets 447 (H) 150 - 400 K/uL   nRBC 0.0 0.0 - 0.2 %  Neutrophils Relative % 81 %   Neutro Abs 14.1 (H) 1.7 - 7.7 K/uL   Lymphocytes Relative 6 %   Lymphs Abs 1.0 0.7 - 4.0 K/uL   Monocytes Relative 12 %   Monocytes Absolute 2.1 (H) 0.1 - 1.0 K/uL   Eosinophils Relative 0 %   Eosinophils Absolute 0.0 0.0 - 0.5 K/uL   Basophils Relative 0 %   Basophils Absolute 0.0 0.0 - 0.1 K/uL   Immature Granulocytes 1 %   Abs Immature Granulocytes 0.13 (H) 0.00 - 0.07 K/uL  Blood gas, venous     Status: Abnormal   Collection Time: 01/12/21  8:39 AM  Result Value Ref Range   pH, Ven 7.22 (L) 7.250 - 7.430   pCO2,  Ven 34 (L) 44.0 - 60.0 mmHg   pO2, Ven 60.0 (H) 32.0 - 45.0 mmHg   Bicarbonate 13.9 (L) 20.0 - 28.0 mmol/L   Acid-base deficit 12.7 (H) 0.0 - 2.0 mmol/L   O2 Saturation 84.6 %   Patient temperature 37.0    Collection site VENOUS    Sample type VENOUS   Lactic acid, plasma     Status: Abnormal   Collection Time: 01/12/21  9:02 AM  Result Value Ref Range   Lactic Acid, Venous 3.3 (HH) 0.5 - 1.9 mmol/L  Protime-INR     Status: None   Collection Time: 01/12/21  9:02 AM  Result Value Ref Range   Prothrombin Time SPECIMEN CLOTTED 11.4 - 15.2 seconds   INR SPECIMEN CLOTTED 0.8 - 1.2  POC SARS Coronavirus 2 Ag-ED - Nasal Swab     Status: None   Collection Time: 01/12/21  9:17 AM  Result Value Ref Range   SARS Coronavirus 2 Ag Negative Negative  SARS Coronavirus 2 by RT PCR (hospital order, performed in [facility] hospital lab) Nasopharyngeal Nasopharyngeal Swab     Status: Abnormal   Collection Time: 01/12/21  9:36 AM   Specimen: Nasopharyngeal Swab  Result Value Ref Range   SARS Coronavirus 2 POSITIVE (A) NEGATIVE  Comprehensive metabolic panel     Status: Abnormal   Collection Time: 01/12/21 10:09 AM  Result Value Ref Range   Sodium 152 (H) 135 - 145 mmol/L   Potassium 5.7 (H) 3.5 - 5.1 mmol/L   Chloride 108 98 - 111 mmol/L   CO2 15 (L) 22 - 32 mmol/L   Glucose, Bld 802 (HH) 70 - 99 mg/dL   BUN 74 (H) 6 - 20 mg/dL   Creatinine, Ser 1.61 (H) 0.61 - 1.24 mg/dL   Calcium 8.6 (L) 8.9 - 10.3 mg/dL   Total Protein 7.5 6.5 - 8.1 g/dL   Albumin 2.5 (L) 3.5 - 5.0 g/dL   AST 18 15 - 41 U/L   ALT 14 0 - 44 U/L   Alkaline Phosphatase 78 38 - 126 U/L   Total Bilirubin 1.8 (H) 0.3 - 1.2 mg/dL   GFR, Estimated 27 (L) >60 mL/min   Anion gap 29 (H) 5 - 15  Protime-INR     Status: None   Collection Time: 01/12/21 10:09 AM  Result Value Ref Range   Prothrombin Time 14.3 11.4 - 15.2 seconds   INR 1.2 0.8 - 1.2  APTT     Status: None   Collection Time: 01/12/21 10:09 AM  Result Value Ref  Range   aPTT 28 24 - 36 seconds   ____________________________________________  EKG My review and personal interpretation at Time: 8:27   Indication: ams  Rate: 120  Rhythm:  sinus Axis: rightward Other: nonspecific st abn, poor r wave progerssion ____________________________________________  RADIOLOGY  I personally reviewed all radiographic images ordered to evaluate for the above acute complaints and reviewed radiology reports and findings.  These findings were personally discussed with the patient.  Please see medical record for radiology report.  ____________________________________________   PROCEDURES  Procedure(s) performed:  .Critical Care Performed by: Willy Eddy, MD Authorized by: Willy Eddy, MD   Critical care provider statement:    Critical care time (minutes):  55   Critical care time was exclusive of:  Separately billable procedures and treating other patients   Critical care was necessary to treat or prevent imminent or life-threatening deterioration of the following conditions:  Respiratory failure, sepsis, renal failure, circulatory failure and endocrine crisis   Critical care was time spent personally by me on the following activities:  Development of treatment plan with patient or surrogate, discussions with consultants, evaluation of patient's response to treatment, examination of patient, obtaining history from patient or surrogate, ordering and performing treatments and interventions, ordering and review of laboratory studies, ordering and review of radiographic studies, pulse oximetry, re-evaluation of patient's condition and review of old charts      Critical Care performed: yes ____________________________________________   INITIAL IMPRESSION / ASSESSMENT AND PLAN / ED COURSE  Pertinent labs & imaging results that were available during my care of the patient were reviewed by me and considered in my medical decision making (see chart for  details).   DDX: covid 19, dka, Asthma, copd, CHF, pna, ptx, malignancy, Pe, anemia   Erik [name] is a 30 y.o. who presents to the ED with presentation as described above.  Patient is critically ill-appearing somewhat stuporous but protecting his airway at this time.  Found to be hypoxic requiring supplemental oxygen.  Patient appears to be having kusmal resp. with CBG greater than 600 concern for diabetic crisis.  Will give IV fluids.  Will check blood work.  The patient will be placed on continuous pulse oximetry and telemetry for monitoring.  Laboratory evaluation will be sent to evaluate for the above complaints.     Clinical Course as of 01/12/21 1150  Tue Jan 12, 2021  1008 Called lab to inquire about why his metabolic panel has not resulted.  They state that it was hemolyzed and will need to be redrawn.  Went to check on patient.  Mentation is somewhat improving but he pulled the nasal cannula off again.  Will give additional IV hydration [PR]  1113 Received call from lab the patient is Covid positive however still have not released metabolic panel. [PR]  1123 Have called lab for the fourth time asking about metabolic panel results.  Apparently they are having issues with the chemistry machine. [PR]  1127 Lab called to confirm patient's potassium is greater than 5.  Glucose greater than 800.  Presentation concerning for hyperglycemic hyperosmolar state.  Insulin infusion will be initiated. [PR]    Clinical Course User Index [PR] Willy Eddy, MD    The patient was evaluated in Emergency Department today for the symptoms described in the history of present illness. He/she was evaluated in the context of the global COVID-19 pandemic, which necessitated consideration that the patient might be at risk for infection with the SARS-CoV-2 virus that causes COVID-19. Institutional protocols and algorithms that pertain to the evaluation of patients at risk for COVID-19 are in a state of rapid  change based on information released by regulatory bodies including the  CDC and federal and [employer]. These policies and algorithms were followed during the patient's care in the ED.  As part of my medical decision making, I reviewed the following data within the electronic MEDICAL RECORD NUMBER Nursing notes reviewed and incorporated, Labs reviewed, notes from prior ED visits and [place] Controlled Substance Database   ____________________________________________   FINAL CLINICAL IMPRESSION(S) / ED DIAGNOSES  Final diagnoses:  Acute respiratory failure with hypoxia (HCC)  Pneumonia due to COVID-19 virus  Hyperosmolar hyperglycemic state (HHS) (HCC)      NEW MEDICATIONS STARTED DURING THIS VISIT:  New Prescriptions   No medications on file     Note:  This document was prepared using Dragon voice recognition software and may include unintentional dictation errors.    Willy Eddy, MD 01/12/21 1150

## 2021-01-12 NOTE — Procedures (Signed)
Central Venous Catheter Insertion Procedure Note  Erik [name]  [id number]  December 22, 1990  Date:01/12/21  Time:11:16 PM   Provider Performing:[facility]   Procedure: Insertion of Non-tunneled Central Venous Catheter(36556)with US guidance (62863)    Indication(s) Medication administration and Hemodialysis  Consent Risks of the procedure as well as the alternatives and risks of each were explained to the patient and/or caregiver.  Consent for the procedure was obtained and is signed in the bedside chart  Anesthesia Topical only with 1% lidocaine   Timeout Verified patient identification, verified procedure, site/side was marked, verified correct patient position, special equipment/implants available, medications/allergies/relevant history reviewed, required imaging and test results available.  Sterile Technique Maximal sterile technique including full sterile barrier drape, hand hygiene, sterile gown, sterile gloves, mask, hair covering, sterile ultrasound probe cover (if used).  Procedure Description Area of catheter insertion was cleaned with chlorhexidine and draped in sterile fashion.   With real-time ultrasound guidance a HD catheter was placed into the right internal jugular vein.  Nonpulsatile blood flow and easy flushing noted in all ports.  The catheter was sutured in place and sterile dressing applied.  Complications/Tolerance None; patient tolerated the procedure well. Chest X-ray is ordered to verify placement for internal jugular or subclavian cannulation.  Chest x-ray is not ordered for femoral cannulation.  EBL Minimal  Specimen(s) None  Sonda Rumble, AGNP  Pulmonary/Critical Care Pager [phone] (please enter 7 digits) PCCM Consult Pager [phone] (please enter 7 digits)

## 2021-01-12 NOTE — ED Notes (Signed)
Attempted to call patient's mother to give an update per her request, but phone was not answered.

## 2021-01-12 NOTE — ED Notes (Signed)
Dr Larinda Buttery and rt in with pt to intubate.  Dr cox in with pt again.  Sinus tach on monitor.   High flow oxygen in place.

## 2021-01-12 NOTE — ED Triage Notes (Signed)
Pt from home via [facility], has been sick x 2 wks per mom, high fevers earlier in sickness. callout today was for sob. EMS said sats 86% on RA when they arrived. Currently on 6LNC sats 92%. CBG reading >600, tachy in 120's. Has not had covid test.

## 2021-01-12 NOTE — ED Notes (Signed)
Patient found with his O2 off and b/p cuff, pulse ox and leads off. Patient 's pule ox was 85 % on room. All were replaced. O2 was increased to 6L via [place]. Pulse ox was at 90%. Dr. Roxan Hockey aware, RT called fo high-flow,

## 2021-01-12 NOTE — ED Notes (Signed)
meds given for intubation   Dr Larinda Buttery and rt at bedside.  8.0 ett used to intubate at 26 at the lip with good color change.

## 2021-01-12 NOTE — ED Notes (Signed)
fsbs 502  Dr cox aware

## 2021-01-12 NOTE — Consult Note (Signed)
Name: Erik [name] MRN: [id number] DOB: Jul 14, 1991     CONSULTATION DATE: 01/12/2021  REFERRING MD :  COX  CHIEF COMPLAINT: COVID 19 pneumonia and ARDS  STUDIES:  The CXR was Independently Reviewed By Me Today CXR reviewed-b/l interstitial infiltrates     HISTORY OF PRESENT ILLNESS:   30 y.o. male with medical history significant for asthma  And morbid obesity  Presents with concerns of weakness, poor p.o. intake, lethargy that has been ongoing for the last 2 weeks, 12/27/2020.    endorses nausea and vomiting.  He has had poor p.o. intake but has been drinking apple juice and orange juice.  never diagnosed with diabetes or any other medical diagnosis.  He has not been to a provider since teenager.  She states that since he is a grown man he has declined to see a primary care provider patient with severe DKA FSBS >700  lives with mother. He does not work and per patient, patient does not have a disability or learning disability.   Vaccinations: not vaccinated as patient refused to get vaccinated per mother  ROS: Unable to obtain due to metabolic encephalopathy  ED Course: Discussed with ED provider, patient required hospitalization due to metabolic encephalopathy, DKA, acute hypoxic respiratory failure secondary to COVID-19 infection.  I was contacted for consultation and I advised patient to be emergently intubated for multiorgan failure and ARDS.  Patient was emergently intubated by ER doc 8.0 ETT  SYNOPSIS 30 yo morbidly obese male with ASTHMA now with progressive multiorgan failure with COVID 19 pneumonia and ARDS with severe acidosis and severe hypoxia with ARDS with acute renal failure  2/1 Emergently intubated     PAST MEDICAL HISTORY :   has a past medical history of Asthma.  has no past surgical history on file. Prior to Admission medications   Not on File   No Known Allergies  FAMILY HISTORY:  family history is not on file. SOCIAL HISTORY:   reports that he has never smoked. He has never used smokeless tobacco. He reports that he does not drink alcohol and does not use drugs.  REVIEW OF SYSTEMS:   Unable to obtain due to critical illness      Estimated body mass index is 47.72 kg/m as calculated from the following:   Height as of this encounter: 6' (1.829 m).   Weight as of this encounter: 159.6 kg.    VITAL SIGNS: Temp:  [98.4 F (36.9 C)] 98.4 F (36.9 C) (02/01 0845) Pulse Rate:  [122-145] 135 (02/01 1715) Resp:  [25-38] 35 (02/01 1715) BP: (81-139)/(36-119) 124/82 (02/01 1700) SpO2:  [87 %-98 %] 87 % (02/01 1715) Weight:  [159.6 kg] 159.6 kg (02/01 1205)   No intake/output data recorded. Total I/O In: 2600 [IV Piggyback:2600] Out: -    SpO2: (!) 87 % O2 Flow Rate (L/min): 7 L/min   Physical Examination:  GENERAL:critically ill appearing, +resp distress HEAD: Normocephalic, atraumatic.  EYES: Pupils equal, round, reactive to light.  No scleral icterus.  MOUTH: Moist mucosal membrane. NECK: Supple. No JVD.  PULMONARY: +rhonchi, +wheezing CARDIOVASCULAR: S1 and S2. Regular rate and rhythm. No murmurs, rubs, or gallops.  GASTROINTESTINAL: Soft, nontender, -distended.  Positive bowel sounds.  MUSCULOSKELETAL: No swelling, clubbing, or edema.  NEUROLOGIC: obtunded SKIN:intact,warm,dry   MEDICATIONS: I have reviewed all medications and confirmed regimen as documented   CULTURE RESULTS   Recent Results (from the past 240 hour(s))  SARS Coronavirus 2 by RT PCR (hospital order, performed in  [facility] hospital lab) Nasopharyngeal Nasopharyngeal Swab     Status: Abnormal   Collection Time: 01/12/21  9:36 AM   Specimen: Nasopharyngeal Swab  Result Value Ref Range Status   SARS Coronavirus 2 POSITIVE (A) NEGATIVE Final    Comment: RESULT CALLED TO, READ BACK BY AND VERIFIED WITH: ANNA JASPER ON 01/12/21 AT 1111 SDR (NOTE) SARS-CoV-2 target nucleic acids are DETECTED  SARS-CoV-2 RNA is generally  detectable in upper respiratory specimens  during the acute phase of infection.  Positive results are indicative  of the presence of the identified virus, but do not rule out bacterial infection or co-infection with other pathogens not detected by the test.  Clinical correlation with patient history and  other diagnostic information is necessary to determine patient infection status.  The expected result is negative.  Fact Sheet for Patients:   BoilerBrush.com.cy   Fact Sheet for Healthcare Providers:   https://pope.com/    This test is not yet approved or cleared by the [place] FDA and  has been authorized for detection and/or diagnosis of SARS-CoV-2 by FDA under an Emergency Use Authorization (EUA).  This EUA will remain in effect (meaning this tes t can be used) for the duration of  the COVID-19 declaration under Section 564(b)(1) of the Act, 21 U.S.C. section 360-bbb-3(b)(1), unless the authorization is terminated or revoked sooner.  Performed at [facility], [address], [place], [place] [address]           IMAGING    DG Chest Portable 1 View  Result Date: 01/12/2021 CLINICAL DATA:  Shortness of breath. EXAM: PORTABLE CHEST 1 VIEW COMPARISON:  No prior. FINDINGS: Cardiomegaly. Low lung volumes. Bilateral pulmonary infiltrates/edema. No prominent pleural effusion. No pneumothorax. No acute bony abnormality. IMPRESSION: 1. Cardiomegaly. 2. Low lung volumes. Bilateral pulmonary infiltrates/edema. Electronically Signed   By: Maisie Fus  Register   On: 01/12/2021 09:16     Nutrition Status:           Indwelling Urinary Catheter continued, requirement due to   Reason to continue Indwelling Urinary Catheter strict Intake/Output monitoring for hemodynamic instability         Ventilator continued, requirement due to severe respiratory failure   Ventilator Sedation RASS 0 to -2      ASSESSMENT AND  PLAN SYNOPSIS 30 yo morbidly obese male with ASTHMA now with progressive multiorgan failure with COVID 19 pneumonia and ARDS with severe acidosis and severe hypoxia with ARDS with acute renal failure   Acute hypoxemic respiratory failure due to COVID-19 pneumonia/ARDS Mechanical ventilation via ARDS protocol, target PRVC 6 cc/kg Wean PEEP and FiO2 as able Goal plateau pressure less than 30, driving pressure less than 15 Paralytics if necessary for vent synchrony, gas exchange Cycle prone positioning if necessary for oxygenation Deep sedation per PAD protocol diuresis as tolerated based on Kidney function VAP prevention order set Follow inflammatory markers as needed with CRP Vitamin C, zinc as indicated Plan to repeat and check resp cultures if needed  Severe ACUTE Hypoxic and Hypercapnic Respiratory Failure -continue Full MV support -continue Bronchodilator Therapy -Wean Fio2 and PEEP as tolerated -VAP/VENT bundle implementation   SEVERE ASTHMA EXACERBATION -continue IV steroids as prescribed -continue NEB THERAPY as prescribed -morphine as needed -wean fio2 as needed and tolerated    Morbid obesity, possible OSA.   Will certainly impact respiratory mechanics, ventilator weaning Suspect will need to consider additional PEEP   ACUTE KIDNEY INJURY/Renal Failure -continue Foley Catheter-assess need -Avoid nephrotoxic agents -Follow urine  output, BMP -Ensure adequate renal perfusion, optimize oxygenation -Renal dose medications     NEUROLOGY Acute toxic metabolic encephalopathy, need for sedation Goal RASS -2 to -3   SHOCK-SEPSIS due to COVID 19 infection/pneumonia -use vasopressors to keep MAP>65 -follow ABG and LA -follow up cultures -emperic ABX  CARDIAC ICU monitoring  ID -continue IV abx as prescibed -follow up cultures  GI GI PROPHYLAXIS as indicated  NUTRITIONAL STATUS DIET-->TF's as tolerated Constipation protocol as indicated   ENDO -  will use ICU hypoglycemic\Hyperglycemia protocol if needed SEVERE DKA-new onset DM   ELECTROLYTES -follow labs as needed -replace as needed -pharmacy consultation and following    DVT/GI PRX ordered and assessed TRANSFUSIONS AS NEEDED MONITOR FSBS I Assessed the need for Labs I Assessed the need for Foley I Assessed the need for Central Venous Line Family Discussion when available I Assessed the need for Mobilization I made an Assessment of medications to be adjusted accordingly Safety Risk assessment Completed  CASE DISCUSSED IN MULTIDISCIPLINARY ROUNDS WITH ICU TEAM   Critical Care Time devoted to patient care services described in this note is 75  minutes.   Overall, patient is critically ill, prognosis is guarded.  Patient with Multiorgan failure and at high risk for cardiac arrest and death.    Lucie Leather, M.D.  [facility]  Medical Director [facility] Medical Director [facility] Cardio-Pulmonary Department

## 2021-01-12 NOTE — ED Provider Notes (Signed)
-----------------------------------------   5:37 PM on 01/12/2021 -----------------------------------------  Patient previously admitted to the hospitalist for hypoxic respiratory failure secondary to COVID-19 along with DKA.  Patient encephalopathic and frequently taking off his oxygen, becoming severely hypoxic.  I was asked by the hospitalist and intensivist to intubate the patient, which was done on the first attempt.  Patient did have precipitous drop in O2 sats during intubation to the mid 70s, however this rapidly improved with bagging once ET tube in place.  X-ray reviewed by me and shows appropriate positioning of ET tube.  Patient has been accepted to the ICU for admission.  Procedure Name: Intubation Date/Time: 01/12/2021 5:39 PM Performed by: Chesley Noon, MD Pre-anesthesia Checklist: Patient identified, Patient being monitored, Emergency Drugs available, Timeout performed and Suction available Oxygen Delivery Method: Non-rebreather mask Preoxygenation: Pre-oxygenation with 100% oxygen Induction Type: Rapid sequence Ventilation: Mask ventilation without difficulty Laryngoscope Size: Glidescope and 4 Grade View: Grade I Tube size: 8.0 mm Number of attempts: 1 Placement Confirmation: ETT inserted through vocal cords under direct vision,  CO2 detector and Breath sounds checked- equal and bilateral Secured at: 26 cm Tube secured with: ETT holder         Chesley Noon, MD 01/12/21 1818

## 2021-01-12 NOTE — ED Notes (Signed)
Resumed care from sonjia rn.  Pt with nausea  meds given.  Sinus tach on monitor.   High flow oxygen in place at 10 liters

## 2021-01-13 ENCOUNTER — Inpatient Hospital Stay: Payer: HRSA Program

## 2021-01-13 LAB — GLUCOSE, CAPILLARY
Glucose-Capillary: 159 mg/dL — ABNORMAL HIGH (ref 70–99)
Glucose-Capillary: 167 mg/dL — ABNORMAL HIGH (ref 70–99)
Glucose-Capillary: 169 mg/dL — ABNORMAL HIGH (ref 70–99)
Glucose-Capillary: 182 mg/dL — ABNORMAL HIGH (ref 70–99)
Glucose-Capillary: 187 mg/dL — ABNORMAL HIGH (ref 70–99)
Glucose-Capillary: 192 mg/dL — ABNORMAL HIGH (ref 70–99)
Glucose-Capillary: 201 mg/dL — ABNORMAL HIGH (ref 70–99)
Glucose-Capillary: 201 mg/dL — ABNORMAL HIGH (ref 70–99)
Glucose-Capillary: 201 mg/dL — ABNORMAL HIGH (ref 70–99)
Glucose-Capillary: 207 mg/dL — ABNORMAL HIGH (ref 70–99)
Glucose-Capillary: 208 mg/dL — ABNORMAL HIGH (ref 70–99)
Glucose-Capillary: 214 mg/dL — ABNORMAL HIGH (ref 70–99)
Glucose-Capillary: 216 mg/dL — ABNORMAL HIGH (ref 70–99)
Glucose-Capillary: 221 mg/dL — ABNORMAL HIGH (ref 70–99)
Glucose-Capillary: 237 mg/dL — ABNORMAL HIGH (ref 70–99)
Glucose-Capillary: 238 mg/dL — ABNORMAL HIGH (ref 70–99)
Glucose-Capillary: 243 mg/dL — ABNORMAL HIGH (ref 70–99)
Glucose-Capillary: 256 mg/dL — ABNORMAL HIGH (ref 70–99)
Glucose-Capillary: 257 mg/dL — ABNORMAL HIGH (ref 70–99)
Glucose-Capillary: 272 mg/dL — ABNORMAL HIGH (ref 70–99)
Glucose-Capillary: 283 mg/dL — ABNORMAL HIGH (ref 70–99)

## 2021-01-13 LAB — BASIC METABOLIC PANEL
Anion gap: 17 — ABNORMAL HIGH (ref 5–15)
Anion gap: 17 — ABNORMAL HIGH (ref 5–15)
Anion gap: 14 (ref 5–15)
BUN: 74 mg/dL — ABNORMAL HIGH (ref 6–20)
BUN: 71 mg/dL — ABNORMAL HIGH (ref 6–20)
BUN: 75 mg/dL — ABNORMAL HIGH (ref 6–20)
CO2: 23 mmol/L (ref 22–32)
CO2: 21 mmol/L — ABNORMAL LOW (ref 22–32)
CO2: 20 mmol/L — ABNORMAL LOW (ref 22–32)
Calcium: 8.4 mg/dL — ABNORMAL LOW (ref 8.9–10.3)
Calcium: 8.1 mg/dL — ABNORMAL LOW (ref 8.9–10.3)
Calcium: 7.9 mg/dL — ABNORMAL LOW (ref 8.9–10.3)
Chloride: 117 mmol/L — ABNORMAL HIGH (ref 98–111)
Chloride: 115 mmol/L — ABNORMAL HIGH (ref 98–111)
Chloride: 119 mmol/L — ABNORMAL HIGH (ref 98–111)
Creatinine, Ser: 4.23 mg/dL — ABNORMAL HIGH (ref 0.61–1.24)
Creatinine, Ser: 4.98 mg/dL — ABNORMAL HIGH (ref 0.61–1.24)
Creatinine, Ser: 5.41 mg/dL — ABNORMAL HIGH (ref 0.61–1.24)
GFR, Estimated: 19 mL/min — ABNORMAL LOW (ref >60)
GFR, Estimated: 15 mL/min — ABNORMAL LOW (ref >60)
GFR, Estimated: 14 mL/min — ABNORMAL LOW (ref >60)
Glucose, Bld: 183 mg/dL — ABNORMAL HIGH (ref 70–99)
Glucose, Bld: 282 mg/dL — ABNORMAL HIGH (ref 70–99)
Glucose, Bld: 254 mg/dL — ABNORMAL HIGH (ref 70–99)
Potassium: 4.6 mmol/L (ref 3.5–5.1)
Potassium: 4.8 mmol/L (ref 3.5–5.1)
Potassium: 5.7 mmol/L — ABNORMAL HIGH (ref 3.5–5.1)
Sodium: 157 mmol/L — ABNORMAL HIGH (ref 135–145)
Sodium: 153 mmol/L — ABNORMAL HIGH (ref 135–145)
Sodium: 153 mmol/L — ABNORMAL HIGH (ref 135–145)

## 2021-01-13 LAB — COMPREHENSIVE METABOLIC PANEL
ALT: 14 U/L (ref 0–44)
AST: 22 U/L (ref 15–41)
Albumin: 2.2 g/dL — ABNORMAL LOW (ref 3.5–5.0)
Alkaline Phosphatase: 71 U/L (ref 38–126)
Anion gap: 16 — ABNORMAL HIGH (ref 5–15)
BUN: 73 mg/dL — ABNORMAL HIGH (ref 6–20)
CO2: 25 mmol/L (ref 22–32)
Calcium: 8.3 mg/dL — ABNORMAL LOW (ref 8.9–10.3)
Chloride: 117 mmol/L — ABNORMAL HIGH (ref 98–111)
Creatinine, Ser: 3.66 mg/dL — ABNORMAL HIGH (ref 0.61–1.24)
GFR, Estimated: 22 mL/min — ABNORMAL LOW (ref >60)
Glucose, Bld: 253 mg/dL — ABNORMAL HIGH (ref 70–99)
Potassium: 4.7 mmol/L (ref 3.5–5.1)
Sodium: 158 mmol/L — ABNORMAL HIGH (ref 135–145)
Total Bilirubin: 0.4 mg/dL (ref 0.3–1.2)
Total Protein: 6.8 g/dL (ref 6.5–8.1)

## 2021-01-13 LAB — URINALYSIS, ROUTINE W REFLEX MICROSCOPIC
Bilirubin Urine: NEGATIVE
Glucose, UA: 50 mg/dL — AB
Ketones, ur: 5 mg/dL — AB
Nitrite: NEGATIVE
Protein, ur: 30 mg/dL — AB
Specific Gravity, Urine: 1.027 (ref 1.005–1.030)
pH: 5 (ref 5.0–8.0)

## 2021-01-13 LAB — CBC WITH DIFFERENTIAL/PLATELET
Abs Immature Granulocytes: 0.16 10*3/uL — ABNORMAL HIGH (ref 0.00–0.07)
Basophils Absolute: 0 10*3/uL (ref 0.0–0.1)
Basophils Relative: 0 %
Eosinophils Absolute: 0 10*3/uL (ref 0.0–0.5)
Eosinophils Relative: 0 %
HCT: 51.1 % (ref 39.0–52.0)
Hemoglobin: 15.3 g/dL (ref 13.0–17.0)
Immature Granulocytes: 1 %
Lymphocytes Relative: 14 %
Lymphs Abs: 2.1 10*3/uL (ref 0.7–4.0)
MCH: 26 pg (ref 26.0–34.0)
MCHC: 29.9 g/dL — ABNORMAL LOW (ref 30.0–36.0)
MCV: 86.8 fL (ref 80.0–100.0)
Monocytes Absolute: 2 10*3/uL — ABNORMAL HIGH (ref 0.1–1.0)
Monocytes Relative: 14 %
Neutro Abs: 10.7 10*3/uL — ABNORMAL HIGH (ref 1.7–7.7)
Neutrophils Relative %: 71 %
Platelets: 403 10*3/uL — ABNORMAL HIGH (ref 150–400)
RBC: 5.89 MIL/uL — ABNORMAL HIGH (ref 4.22–5.81)
RDW: 13.9 % (ref 11.5–15.5)
WBC: 15 10*3/uL — ABNORMAL HIGH (ref 4.0–10.5)
nRBC: 0 % (ref 0.0–0.2)

## 2021-01-13 LAB — PROTEIN / CREATININE RATIO, URINE
Creatinine, Urine: 441 mg/dL
Protein Creatinine Ratio: 0.24 mg/mg{Cre} — ABNORMAL HIGH (ref 0.00–0.15)
Total Protein, Urine: 108 mg/dL

## 2021-01-13 LAB — BETA-HYDROXYBUTYRIC ACID: Beta-Hydroxybutyric Acid: 0.08 mmol/L (ref 0.05–0.27)

## 2021-01-13 LAB — FIBRIN DERIVATIVES D-DIMER (ARMC ONLY): Fibrin derivatives D-dimer (ARMC): 866.78 ng/mL (FEU) — ABNORMAL HIGH (ref 0.00–499.00)

## 2021-01-13 LAB — C-REACTIVE PROTEIN: CRP: 16.9 mg/dL — ABNORMAL HIGH (ref <1.0)

## 2021-01-13 LAB — HIV ANTIBODY (ROUTINE TESTING W REFLEX): HIV Screen 4th Generation wRfx: NONREACTIVE

## 2021-01-13 LAB — PHOSPHORUS: Phosphorus: 5.6 mg/dL — ABNORMAL HIGH (ref 2.5–4.6)

## 2021-01-13 LAB — TRIGLYCERIDES: Triglycerides: 493 mg/dL — ABNORMAL HIGH (ref <150)

## 2021-01-13 LAB — MAGNESIUM: Magnesium: 3.9 mg/dL — ABNORMAL HIGH (ref 1.7–2.4)

## 2021-01-13 LAB — LACTIC ACID, PLASMA: Lactic Acid, Venous: 3.4 mmol/L (ref 0.5–1.9)

## 2021-01-13 MED ORDER — VITAL HIGH PROTEIN PO LIQD: 1000.0000 mL | ORAL | Status: DC

## 2021-01-13 MED ORDER — NOREPINEPHRINE 16 MG/250ML-% IV SOLN
0.0000 ug/min | INTRAVENOUS | Status: DC
  Administered 2021-01-13: 10 ug/min via INTRAVENOUS
  Administered 2021-01-14: 11:00:00 6 ug/min via INTRAVENOUS
  Administered 2021-01-16: 01:00:00 7 ug/min via INTRAVENOUS
  Filled 2021-01-13 (×3): qty 250

## 2021-01-13 MED ORDER — SODIUM CHLORIDE 0.9 % IV SOLN
100.0000 mg | Freq: Every day | INTRAVENOUS | Status: AC
  Administered 2021-01-13 – 2021-01-16 (×4): 100 mg via INTRAVENOUS
  Filled 2021-01-13 (×4): qty 100

## 2021-01-13 MED ORDER — FREE WATER
200.0000 mL | Status: DC
  Administered 2021-01-13 – 2021-01-15 (×26): 200 mL

## 2021-01-13 MED ORDER — VITAL 1.5 CAL PO LIQD
1000.0000 mL | ORAL | Status: DC
  Administered 2021-01-13 – 2021-01-14 (×2): 1000 mL

## 2021-01-13 MED ORDER — PROSOURCE TF PO LIQD
90.0000 mL | Freq: Four times a day (QID) | ORAL | Status: DC
  Administered 2021-01-13 – 2021-01-19 (×22): 90 mL
  Filled 2021-01-13 (×27): qty 90

## 2021-01-13 NOTE — Consult Note (Signed)
[address] [place], [place] [address] Phone [phone]. Fax [phone]  Date: 01/13/2021                  Patient Name:  Erik [name]  MRN: [id number]  DOB: 1991-07-09  Age / Sex: 30 y.o., male         PCP: Pcp, No                 Service Requesting Consult: IM/ Flora Lipps, MD                 Reason for Consult: ARF            History of Present Illness: Patient is a 30 y.o. male was admitted to [facility] on 01/12/2021  Present to ER via home for feeling sick x 2 weeks, fever, shortness of breath Was tachycardic Required high flow O2 supplementation Was taking OTC mucinex  Dx with DKA and admitted for further evaluation Nephrology consult for evaluation Patient is intubated and sedated  Sedated with propofol and fentanyl Vent assisted, Fio2 90% Sinus tach No TF at present Foley in place Febrile, Cooling blanket in place  fam hx- frandmothermother, aunts have DM Mother- takes takes dialysis @ [facility] [address]   Medications: Outpatient medications: No medications prior to admission.    Current medications: Current Facility-Administered Medications  Medication Dose Route Frequency Provider Last Rate Last Admin  . 0.9 %  sodium chloride infusion  250 mL Intravenous PRN Flora Lipps, MD      . acetaminophen (TYLENOL) tablet 325 mg  325 mg Oral Q6H PRN Cox, Amy N, DO      . acetaminophen (TYLENOL) tablet 650 mg  650 mg Oral Q4H PRN Flora Lipps, MD   650 mg at 01/13/21 0630  . azithromycin (ZITHROMAX) 500 mg in sodium chloride 0.9 % 250 mL IVPB  500 mg Intravenous Q24H Cox, Amy N, DO      . cefTRIAXone (ROCEPHIN) 2 g in sodium chloride 0.9 % 100 mL IVPB  2 g Intravenous Q24H Benita Gutter, RPH      . chlorhexidine gluconate (MEDLINE KIT) (PERIDEX) 0.12 % solution 15 mL  15 mL Mouth Rinse BID Flora Lipps, MD   15 mL at 01/12/21 2045  . Chlorhexidine Gluconate Cloth 2 % PADS 6 each  6 each Topical Daily Kasa, Kurian, MD      . dextrose 5 % in lactated  ringers infusion   Intravenous Continuous Cox, Amy N, DO 125 mL/hr at 01/13/21 0820 New Bag at 01/13/21 0820  . dextrose 50 % solution 0-50 mL  0-50 mL Intravenous PRN Cox, Amy N, DO      . docusate (COLACE) 50 MG/5ML liquid 100 mg  100 mg Per Tube BID Flora Lipps, MD   100 mg at 01/12/21 2226  . docusate sodium (COLACE) capsule 100 mg  100 mg Oral BID PRN Flora Lipps, MD      . famotidine (PEPCID) IVPB 20 mg premix  20 mg Intravenous Q12H Flora Lipps, MD 100 mL/hr at 01/12/21 2225 20 mg at 01/12/21 2225  . fentaNYL (SUBLIMAZE) bolus via infusion 50 mcg  50 mcg Intravenous Q15 min PRN Flora Lipps, MD      . fentaNYL 2569mg in NS 2528m(1028mml) infusion-PREMIX  50-200 mcg/hr Intravenous Continuous KasFlora LippsD 20 mL/hr at 01/13/21 0358 200 mcg/hr at 01/13/21 0358  . free water 200 mL  200 mL Per Tube Q2H BlaAwilda BillP  200 mL at 01/13/21 0526  . heparin injection 5,000 Units  5,000 Units Subcutaneous Q8H Flora Lipps, MD   5,000 Units at 01/13/21 0526  . insulin regular bolus via infusion 20 Units  20 Units Intravenous Once Benita Gutter, RPH      . insulin regular, human (MYXREDLIN) 100 units/ 100 mL infusion   Intravenous Continuous Cox, Amy N, DO 6.5 mL/hr at 01/13/21 0657 6.5 Units/hr at 01/13/21 0657  . lactated ringers bolus 3,000 mL  3,000 mL Intravenous Once Cox, Amy N, DO   Held at 01/12/21 1818  . lactated ringers infusion   Intravenous Continuous Cox, Amy N, DO   Stopped at 01/13/21 0029  . MEDLINE mouth rinse  15 mL Mouth Rinse 10 times per day Flora Lipps, MD   15 mL at 01/13/21 0526  . methylPREDNISolone sodium succinate (SOLU-MEDROL) 40 mg/mL injection 40 mg  40 mg Intravenous Q12H Flora Lipps, MD   40 mg at 01/12/21 2226  . midazolam (VERSED) injection 2 mg  2 mg Intravenous Q15 min PRN Flora Lipps, MD      . midazolam (VERSED) injection 2 mg  2 mg Intravenous Q2H PRN Flora Lipps, MD      . norepinephrine (LEVOPHED) 16 mg in 277m premix infusion  0-40  mcg/min Intravenous Titrated BAwilda Bill NP 9.38 mL/hr at 01/13/21 0219 10 mcg/min at 01/13/21 0219  . ondansetron (ZOFRAN) tablet 4 mg  4 mg Oral Q6H PRN Cox, Amy N, DO       Or  . ondansetron (ZOFRAN) injection 4 mg  4 mg Intravenous Q6H PRN Cox, Amy N, DO      . ondansetron (ZOFRAN) injection 4 mg  4 mg Intravenous Q6H PRN KMortimer Fries Kurian, MD      . polyethylene glycol (MIRALAX / GLYCOLAX) packet 17 g  17 g Oral Daily PRN KFlora Lipps MD      . polyethylene glycol (MIRALAX / GLYCOLAX) packet 17 g  17 g Per Tube Daily Kasa, Kurian, MD      . propofol (DIPRIVAN) 1000 MG/100ML infusion  0-50 mcg/kg/min Intravenous Continuous KFlora Lipps MD 47.9 mL/hr at 01/13/21 0820 50 mcg/kg/min at 01/13/21 0820  . sodium chloride flush (NS) 0.9 % injection 3 mL  3 mL Intravenous Q12H KFlora Lipps MD   3 mL at 01/12/21 2229  . sodium chloride flush (NS) 0.9 % injection 3 mL  3 mL Intravenous PRN KFlora Lipps MD          Allergies: No Known Allergies    Past Medical History: Past Medical History:  Diagnosis Date  . Asthma      Past Surgical History: History reviewed. No pertinent surgical history.   Family History: No family history on file.   Social History: Social History   Socioeconomic History  . Marital status: Single    Spouse name: Not on file  . Number of children: Not on file  . Years of education: Not on file  . Highest education level: Not on file  Occupational History  . Not on file  Tobacco Use  . Smoking status: Never Smoker  . Smokeless tobacco: Never Used  Substance and Sexual Activity  . Alcohol use: Never  . Drug use: Never  . Sexual activity: Not on file  Other Topics Concern  . Not on file  Social History Narrative  . Not on file   Social Determinants of Health   Financial Resource Strain: Not on file  Food Insecurity: Not on  file  Transportation Needs: Not on file  Physical Activity: Not on file  Stress: Not on file  Social Connections: Not  on file  Intimate Partner Violence: Not on file     Review of Systems: not available Gen:  HEENT:  CV:  Resp:  GI: GU :  MS:  Derm:    Psych: Heme:  Neuro:  Endocrine  Vital Signs: Blood pressure (!) 83/58, pulse (!) 110, temperature (!) 102.8 F (39.3 C), temperature source Bladder, resp. rate (!) 25, height 6' (1.829 m), weight (!) 164.9 kg, SpO2 95 %.   Intake/Output Summary (Last 24 hours) at 01/13/2021 0916 Last data filed at 01/13/2021 0552 Gross per 24 hour  Intake 5094.18 ml  Output 660 ml  Net 4434.18 ml    Weight trends: Filed Weights   01/12/21 1205 01/13/21 0402  Weight: (!) 159.6 kg (!) 164.9 kg    Physical Exam: General:  ill appearing  HEENT ETT in place,  Neck:  rt IJ temp cath  Lungs: Vent assited, coarse  Heart:: Tachycardic, regular   Abdomen: soft  Extremities:  no edema  Neurologic: sedated  Skin: warm  Access: Rt IJ temp cath  Foley: present       Lab results: Basic Metabolic Panel: Recent Labs  Lab 01/12/21 1459 01/12/21 2135 01/13/21 0319  NA 155* 158* 158*  K 5.3* 4.6 4.7  CL 111 119* 117*  CO2 17* 24 25  GLUCOSE 755* 336* 253*  BUN 78* 75* 73*  CREATININE 3.18* 3.42* 3.66*  CALCIUM 8.4* 8.2* 8.3*  MG  --   --  3.9*  PHOS  --   --  5.6*    Liver Function Tests: Recent Labs  Lab 01/13/21 0319  AST 22  ALT 14  ALKPHOS 71  BILITOT 0.4  PROT 6.8  ALBUMIN 2.2*   No results for input(s): LIPASE, AMYLASE in the last 168 hours. No results for input(s): AMMONIA in the last 168 hours.  CBC: Recent Labs  Lab 01/12/21 1308 01/13/21 0319  WBC 17.9* 15.0*  NEUTROABS 14.9* 10.7*  HGB 15.3 15.3  HCT 53.5* 51.1  MCV 88.9 86.8  PLT 441* 403*    Cardiac Enzymes: No results for input(s): CKTOTAL, TROPONINI in the last 168 hours.  BNP: Invalid input(s): POCBNP  CBG: Recent Labs  Lab 01/13/21 0437 01/13/21 0548 01/13/21 0650 01/13/21 0804 01/13/21 0904  GLUCAP 238* 201* 187* 182* 167*     Microbiology: Recent Results (from the past 720 hour(s))  Blood culture (routine single)     Status: None (Preliminary result)   Collection Time: 01/12/21  9:05 AM   Specimen: BLOOD  Result Value Ref Range Status   Specimen Description BLOOD LEFT AC  Final   Special Requests   Final    BOTTLES DRAWN AEROBIC ONLY Blood Culture results may not be optimal due to an inadequate volume of blood received in culture bottles   Culture   Final    NO GROWTH < 24 HOURS Performed at [facility], [address], [place], [place] [address]    Report Status PENDING  Incomplete  SARS Coronavirus 2 by RT PCR (hospital order, performed in [facility] hospital lab) Nasopharyngeal Nasopharyngeal Swab     Status: Abnormal   Collection Time: 01/12/21  9:36 AM   Specimen: Nasopharyngeal Swab  Result Value Ref Range Status   SARS Coronavirus 2 POSITIVE (A) NEGATIVE Final    Comment: RESULT CALLED TO, READ BACK BY AND VERIFIED WITH: ANNA  JASPER ON 01/12/21 AT 20 SDR (NOTE) SARS-CoV-2 target nucleic acids are DETECTED  SARS-CoV-2 RNA is generally detectable in upper respiratory specimens  during the acute phase of infection.  Positive results are indicative  of the presence of the identified virus, but do not rule out bacterial infection or co-infection with other pathogens not detected by the test.  Clinical correlation with patient history and  other diagnostic information is necessary to determine patient infection status.  The expected result is negative.  Fact Sheet for Patients:   StrictlyIdeas.no   Fact Sheet for Healthcare Providers:   BankingDealers.co.za    This test is not yet approved or cleared by the [place] FDA and  has been authorized for detection and/or diagnosis of SARS-CoV-2 by FDA under an Emergency Use Authorization (EUA).  This EUA will remain in effect (meaning this tes t can be used) for the duration of  the  COVID-19 declaration under Section 564(b)(1) of the Act, 21 U.S.C. section 360-bbb-3(b)(1), unless the authorization is terminated or revoked sooner.  Performed at [facility], [address] [place], [place] [address]   Culture, blood (single)     Status: None (Preliminary result)   Collection Time: 01/12/21  1:08 PM   Specimen: BLOOD  Result Value Ref Range Status   Specimen Description BLOOD BLOOD RIGHT HAND  Final   Special Requests   Final    BOTTLES DRAWN AEROBIC AND ANAEROBIC Blood Culture adequate volume   Culture   Final    NO GROWTH < 24 HOURS Performed at [facility], [address] [place], [place] [address]    Report Status PENDING  Incomplete  MRSA PCR Screening     Status: None   Collection Time: 01/12/21  7:22 PM   Specimen: Nasopharyngeal  Result Value Ref Range Status   MRSA by PCR NEGATIVE NEGATIVE Final    Comment:        The GeneXpert MRSA Assay (FDA approved for NASAL specimens only), is one component of a comprehensive MRSA colonization surveillance program. It is not intended to diagnose MRSA infection nor to guide or monitor treatment for MRSA infections. Performed at [facility], [address] [place], [place] [address]      Coagulation Studies: Recent Labs    01/12/21 0902 01/12/21 1009  LABPROT SPECIMEN CLOTTED 14.3  INR SPECIMEN CLOTTED 1.2    Urinalysis: No results for input(s): COLORURINE, LABSPEC, PHURINE, GLUCOSEU, HGBUR, BILIRUBINUR, KETONESUR, PROTEINUR, UROBILINOGEN, NITRITE, LEUKOCYTESUR in the last 72 hours.  Invalid input(s): APPERANCEUR      Imaging: DG Abdomen 1 View  Result Date: 01/12/2021 CLINICAL DATA:  NG tube placement. EXAM: ABDOMEN - 1 VIEW COMPARISON:  None. FINDINGS: Tip of the enteric tube is below the diaphragm in the stomach, the side port is in the distal esophagus. Advancement of at least 7 cm is recommended for optimal placement. Normal bowel gas pattern in the  included upper abdomen. IMPRESSION: Tip of the enteric tube below the diaphragm in the stomach, the side port in the distal esophagus. Advancement of at least 7 cm recommended for optimal placement. Electronically Signed   By: Keith Rake M.D.   On: 01/12/2021 18:03   DG Chest Port 1 View  Result Date: 01/12/2021 CLINICAL DATA:  Central line placement. EXAM: PORTABLE CHEST 1 VIEW COMPARISON:  Earlier today. FINDINGS: Right-sided central line tip projects over the mid SVC. No pneumothorax. Endotracheal tube appears low in positioning 9 mm from the carina. Enteric tube in  place with side-port now in the stomach below the diaphragm. Persistent volume loss at the left lung base with dense airspace opacity. Patchy opacity in the left perihilar region and right mid lung are similar. IMPRESSION: 1. Tip of the right central line projects over the mid SVC. No pneumothorax. 2. Endotracheal tube appears low in positioning 9 mm from the carina. Recommend retraction of 1-2 cm. 3. Enteric tube in place with side-port now in the stomach below the diaphragm. 4. Dense retrocardiac opacity, similar to prior. Additional patchy opacities throughout both lungs not significantly changed. Electronically Signed   By: Keith Rake M.D.   On: 01/12/2021 23:36   DG Chest Portable 1 View  Result Date: 01/12/2021 CLINICAL DATA:  Intubation. EXAM: PORTABLE CHEST 1 VIEW COMPARISON:  Chest radiograph earlier today. FINDINGS: Endotracheal tube tip is 2.4 cm from the carina. Enteric tube is in place tip below the diaphragm, side-port in the distal esophagus, this is been slightly advanced on subsequent abdominal radiograph. Patient is rotated. Increasing volume loss and opacity at the left lung base. Streaky opacities in the right lower lung zone. Heart size grossly normal, not well assessed. No pneumothorax. Limited assessment for pleural effusion on the current exam. IMPRESSION: 1. Endotracheal tube tip 2.4 cm from the carina. 2.  Enteric tube tip below the diaphragm, side-port in the distal esophagus. 3. Increasing volume loss and opacity at the left lung base, likely atelectasis and or lobar collapse/mucous plugging. Progressive is also considered. Streaky opacities in the right lower lung zone may be atelectasis or pneumonia. Electronically Signed   By: Keith Rake M.D.   On: 01/12/2021 18:02   DG Chest Portable 1 View  Result Date: 01/12/2021 CLINICAL DATA:  Shortness of breath. EXAM: PORTABLE CHEST 1 VIEW COMPARISON:  No prior. FINDINGS: Cardiomegaly. Low lung volumes. Bilateral pulmonary infiltrates/edema. No prominent pleural effusion. No pneumothorax. No acute bony abnormality. IMPRESSION: 1. Cardiomegaly. 2. Low lung volumes. Bilateral pulmonary infiltrates/edema. Electronically Signed   By: Marcello Moores  Register   On: 01/12/2021 09:16      Assessment & Plan: Pt is a 30 y.o.   male with h/o Asthma, no recent medical care , was admitted on 01/12/2021 with ARDS (adult respiratory distress syndrome) ([place]) [J80] Acute respiratory failure with hypoxia ([place]) [J96.01] Acute hypoxemic respiratory failure due to COVID-19 ([place]) [U07.1, J96.01] Hyperosmolar hyperglycemic state (HHS) ([place]) [E11.00, E11.65] Pneumonia due to COVID-19 virus [U07.1, J12.82]    # AKI Baseline creatinine is unknown Will obtain urinalysis and renal imaging UOP is poor Lab Results  Component Value Date   CREATININE 4.23 (H) 01/13/2021   CREATININE 3.66 (H) 01/13/2021   CREATININE 3.42 (H) 01/12/2021   02/01 0701 - 02/02 0700 In: 5094.2 [I.V.:2444.2; IV Piggyback:2650] Out: 660 [Urine:660]  Electrolytes and volume status are acceptable No acute indication for HD at present although may need this admission Fluid intake ~ 80 cc (with meds) Avoid maintenance fluids Pressors for hypotension  # New Dx of Diabetes, controlled Lab Results  Component Value Date   HGBA1C 13.6 (H) 01/12/2021   Will send C-peptide and antiGAD abs  # Acute  respiratory failure # Covid 19-infection  Management as per ICU team Currently Vent dependent    LOS: 1 Kelsay Haggard 2/2/20229:16 AM    Note: This note was prepared with Dragon dictation. Any transcription errors are unintentional

## 2021-01-13 NOTE — Progress Notes (Signed)
CRITICAL CARE NOTE   SYNOPSIS 30 yo morbidly obese male with ASTHMA now with progressive multiorgan failure with COVID 19 pneumonia and ARDS with severe acidosis and severe hypoxia with ARDS with acute renal failure  2/1 Emergently intubated 2/2 severe resp failure, vasc cath placed  CC  follow up respiratory failure  SUBJECTIVE Patient remains critically ill Prognosis is guarded  Vent Mode: PRVC FiO2 (%):  [80 %-100 %] 100 % Set Rate:  [25 bmp] 25 bmp Vt Set:  [550 mL] 550 mL PEEP:  [5 cmH20] 5 cmH20  CBC    Component Value Date/Time   WBC 15.0 (H) 01/13/2021 0319   RBC 5.89 (H) 01/13/2021 0319   HGB 15.3 01/13/2021 0319   HCT 51.1 01/13/2021 0319   PLT 403 (H) 01/13/2021 0319   MCV 86.8 01/13/2021 0319   MCH 26.0 01/13/2021 0319   MCHC 29.9 (L) 01/13/2021 0319   RDW 13.9 01/13/2021 0319   LYMPHSABS 2.1 01/13/2021 0319   MONOABS 2.0 (H) 01/13/2021 0319   EOSABS 0.0 01/13/2021 0319   BASOSABS 0.0 01/13/2021 0319   BMP Latest Ref Rng & Units 01/13/2021 01/12/2021 01/12/2021  Glucose 70 - 99 mg/dL 563(S) 937(D) 428(JG)  BUN 6 - 20 mg/dL 81(L) 57(W) 62(M)  Creatinine 0.61 - 1.24 mg/dL 3.55(H) 7.41(U) 3.84(T)  Sodium 135 - 145 mmol/L 158(H) 158(H) 155(H)  Potassium 3.5 - 5.1 mmol/L 4.7 4.6 5.3(H)  Chloride 98 - 111 mmol/L 117(H) 119(H) 111  CO2 22 - 32 mmol/L 25 24 17(L)  Calcium 8.9 - 10.3 mg/dL 8.3(L) 8.2(L) 8.4(L)    BP (!) 112/49   Pulse (!) 120   Temp (!) 102.8 F (39.3 C) (Bladder)   Resp (!) 25   Ht 6' (1.829 m)   Wt (!) 164.9 kg   SpO2 96%   BMI 49.30 kg/m    I/O last 3 completed shifts: In: 5094.2 [I.V.:2444.2; IV Piggyback:2650] Out: 660 [Urine:660] No intake/output data recorded.  SpO2: 96 % O2 Flow Rate (L/min): 7 L/min FiO2 (%): 100 %  Estimated body mass index is 49.3 kg/m as calculated from the following:   Height as of this encounter: 6' (1.829 m).   Weight as of this encounter: 164.9 kg.  SIGNIFICANT EVENTS   REVIEW OF  SYSTEMS  PATIENT IS UNABLE TO PROVIDE COMPLETE REVIEW OF SYSTEMS DUE TO SEVERE CRITICAL ILLNESS       PHYSICAL EXAMINATION: GENERAL:critically ill appearing, +resp distress NECK: Supple.  PULMONARY: +rhonchi, +wheezing CARDIOVASCULAR: S1 and S2.  GASTROINTESTINAL: Soft, nontender, +Positive bowel sounds.  MUSCULOSKELETAL: No swelling, clubbing, or edema.  NEUROLOGIC: obtunded, GCS<8 SKIN:intact,warm,dry     MEDICATIONS: I have reviewed all medications and confirmed regimen as documented   CULTURE RESULTS   Recent Results (from the past 240 hour(s))  Blood culture (routine single)     Status: None (Preliminary result)   Collection Time: 01/12/21  9:05 AM   Specimen: BLOOD  Result Value Ref Range Status   Specimen Description BLOOD LEFT AC  Final   Special Requests   Final    BOTTLES DRAWN AEROBIC ONLY Blood Culture results may not be optimal due to an inadequate volume of blood received in culture bottles   Culture   Final    NO GROWTH < 24 HOURS Performed at [facility], [address] [place], [place] [address]    Report Status PENDING  Incomplete  SARS Coronavirus 2 by RT PCR (hospital order, performed in [facility] hospital lab) Nasopharyngeal Nasopharyngeal Swab  Status: Abnormal   Collection Time: 01/12/21  9:36 AM   Specimen: Nasopharyngeal Swab  Result Value Ref Range Status   SARS Coronavirus 2 POSITIVE (A) NEGATIVE Final    Comment: RESULT CALLED TO, READ BACK BY AND VERIFIED WITH: ANNA JASPER ON 01/12/21 AT 1111 SDR (NOTE) SARS-CoV-2 target nucleic acids are DETECTED  SARS-CoV-2 RNA is generally detectable in upper respiratory specimens  during the acute phase of infection.  Positive results are indicative  of the presence of the identified virus, but do not rule out bacterial infection or co-infection with other pathogens not detected by the test.  Clinical correlation with patient history and  other diagnostic information is necessary to  determine patient infection status.  The expected result is negative.  Fact Sheet for Patients:   BoilerBrush.com.cy   Fact Sheet for Healthcare Providers:   https://pope.com/    This test is not yet approved or cleared by the [place] FDA and  has been authorized for detection and/or diagnosis of SARS-CoV-2 by FDA under an Emergency Use Authorization (EUA).  This EUA will remain in effect (meaning this tes t can be used) for the duration of  the COVID-19 declaration under Section 564(b)(1) of the Act, 21 U.S.C. section 360-bbb-3(b)(1), unless the authorization is terminated or revoked sooner.  Performed at [facility], [address] [place], [place] [address]   Culture, blood (single)     Status: None (Preliminary result)   Collection Time: 01/12/21  1:08 PM   Specimen: BLOOD  Result Value Ref Range Status   Specimen Description BLOOD BLOOD RIGHT HAND  Final   Special Requests   Final    BOTTLES DRAWN AEROBIC AND ANAEROBIC Blood Culture adequate volume   Culture   Final    NO GROWTH < 24 HOURS Performed at [facility], [address], [place], [place] [address]    Report Status PENDING  Incomplete  MRSA PCR Screening     Status: None   Collection Time: 01/12/21  7:22 PM   Specimen: Nasopharyngeal  Result Value Ref Range Status   MRSA by PCR NEGATIVE NEGATIVE Final    Comment:        The GeneXpert MRSA Assay (FDA approved for NASAL specimens only), is one component of a comprehensive MRSA colonization surveillance program. It is not intended to diagnose MRSA infection nor to guide or monitor treatment for MRSA infections. Performed at [facility], [address] [place], [place] [address]           IMAGING    DG Abdomen 1 View  Result Date: 01/12/2021 CLINICAL DATA:  NG tube placement. EXAM: ABDOMEN - 1 VIEW COMPARISON:  None. FINDINGS: Tip of the enteric tube is below the  diaphragm in the stomach, the side port is in the distal esophagus. Advancement of at least 7 cm is recommended for optimal placement. Normal bowel gas pattern in the included upper abdomen. IMPRESSION: Tip of the enteric tube below the diaphragm in the stomach, the side port in the distal esophagus. Advancement of at least 7 cm recommended for optimal placement. Electronically Signed   By: Narda Rutherford M.D.   On: 01/12/2021 18:03   DG Chest Port 1 View  Result Date: 01/12/2021 CLINICAL DATA:  Central line placement. EXAM: PORTABLE CHEST 1 VIEW COMPARISON:  Earlier today. FINDINGS: Right-sided central line tip projects over the mid SVC. No pneumothorax. Endotracheal tube appears low in positioning 9 mm from the carina. Enteric tube in place with  side-port now in the stomach below the diaphragm. Persistent volume loss at the left lung base with dense airspace opacity. Patchy opacity in the left perihilar region and right mid lung are similar. IMPRESSION: 1. Tip of the right central line projects over the mid SVC. No pneumothorax. 2. Endotracheal tube appears low in positioning 9 mm from the carina. Recommend retraction of 1-2 cm. 3. Enteric tube in place with side-port now in the stomach below the diaphragm. 4. Dense retrocardiac opacity, similar to prior. Additional patchy opacities throughout both lungs not significantly changed. Electronically Signed   By: Narda Rutherford M.D.   On: 01/12/2021 23:36   DG Chest Portable 1 View  Result Date: 01/12/2021 CLINICAL DATA:  Intubation. EXAM: PORTABLE CHEST 1 VIEW COMPARISON:  Chest radiograph earlier today. FINDINGS: Endotracheal tube tip is 2.4 cm from the carina. Enteric tube is in place tip below the diaphragm, side-port in the distal esophagus, this is been slightly advanced on subsequent abdominal radiograph. Patient is rotated. Increasing volume loss and opacity at the left lung base. Streaky opacities in the right lower lung zone. Heart size grossly  normal, not well assessed. No pneumothorax. Limited assessment for pleural effusion on the current exam. IMPRESSION: 1. Endotracheal tube tip 2.4 cm from the carina. 2. Enteric tube tip below the diaphragm, side-port in the distal esophagus. 3. Increasing volume loss and opacity at the left lung base, likely atelectasis and or lobar collapse/mucous plugging. Progressive is also considered. Streaky opacities in the right lower lung zone may be atelectasis or pneumonia. Electronically Signed   By: Narda Rutherford M.D.   On: 01/12/2021 18:02   DG Chest Portable 1 View  Result Date: 01/12/2021 CLINICAL DATA:  Shortness of breath. EXAM: PORTABLE CHEST 1 VIEW COMPARISON:  No prior. FINDINGS: Cardiomegaly. Low lung volumes. Bilateral pulmonary infiltrates/edema. No prominent pleural effusion. No pneumothorax. No acute bony abnormality. IMPRESSION: 1. Cardiomegaly. 2. Low lung volumes. Bilateral pulmonary infiltrates/edema. Electronically Signed   By: Maisie Fus  Register   On: 01/12/2021 09:16     Nutrition Status:           Indwelling Urinary Catheter continued, requirement due to   Reason to continue Indwelling Urinary Catheter strict Intake/Output monitoring for hemodynamic instability   Central Line/ continued, requirement due to  Reason to continue [employer] Monitoring of central venous pressure or other hemodynamic parameters and poor IV access   Ventilator continued, requirement due to severe respiratory failure   Ventilator Sedation RASS 0 to -2      ASSESSMENT AND PLAN SYNOPSIS  30 yo morbidly obese male with ASTHMA now with progressive multiorgan failure with COVID 19 pneumonia and ARDS with severe acidosis and severe hypoxia with ARDS with acute renal failure   Acute hypoxemic respiratory failure due to COVID-19 pneumonia/ARDS Mechanical ventilation via ARDS protocol, target PRVC 6 cc/kg Wean PEEP and FiO2 as able Goal plateau pressure less than 30, driving pressure less than  15 Paralytics if necessary for vent synchrony, gas exchange Cycle prone positioning if necessary for oxygenation Deep sedation per PAD protocol Diuresis as tolerated based on Kidney function VAP prevention order set Remdesivir Therapy IV STEROIDS Therapy Follow inflammatory markers as needed with CRP Vitamin C, zinc Plan to repeat and check resp cultures as needed   Severe ACUTE Hypoxic and Hypercapnic Respiratory Failure -continue Full MV support -continue Bronchodilator Therapy -Wean Fio2 and PEEP as tolerated -VAP/VENT bundle implementation  ACUTE DIASTOLIC CARDIAC FAILURE-  -oxygen as needed -Lasix as tolerated   Morbid  obesity, possible OSA.   Will certainly impact respiratory mechanics, ventilator weaning Suspect will need to consider additional PEEP   ACUTE KIDNEY INJURY/Renal Failure -continue Foley Catheter-assess need -Avoid nephrotoxic agents -Follow urine output, BMP -Ensure adequate renal perfusion, optimize oxygenation -Renal dose medications     NEUROLOGY Acute toxic metabolic encephalopathy, need for sedation Goal RASS -2 to -3  SHOCK-SEPSIS -use vasopressors to keep MAP>65  CARDIAC ICU monitoring  ID -continue IV abx as prescibed -follow up cultures  GI GI PROPHYLAXIS as indicated   DIET-->TF's as tolerated Constipation protocol as indicated  ENDO - will use ICU hypoglycemic\Hyperglycemia protocol if indicated    ELECTROLYTES -follow labs as needed -replace as needed -pharmacy consultation and following   DVT/GI PRX ordered and assessed TRANSFUSIONS AS NEEDED MONITOR FSBS I Assessed the need for Labs I Assessed the need for Foley I Assessed the need for Central Venous Line Family Discussion when available I Assessed the need for Mobilization I made an Assessment of medications to be adjusted accordingly Safety Risk assessment completed   CASE DISCUSSED IN MULTIDISCIPLINARY ROUNDS WITH ICU TEAM  Critical Care Time  devoted to patient care services described in this note is 65 minutes.   Overall, patient is critically ill, prognosis is guarded.  Patient with Multiorgan failure and at high risk for cardiac arrest and death.    Lucie Leather, M.D.  [facility]  Medical Director [facility] Medical Director [facility] Cardio-Pulmonary Department

## 2021-01-13 NOTE — Progress Notes (Signed)
Initial Nutrition Assessment  DOCUMENTATION CODES:   Morbid obesity  INTERVENTION:  Initiate Vital 1.5 Cal at 20 mL/hr and advance by 15 mL/hr every 12 hours to goal rate of 50 mL/hr (1200 mL goal daily volume). Also provide PROSource TF 90 mL QID per tube. Provides 2120 kcal, 169 grams of protein, 912 mL H2O daily. With current propofol rate provides 2648 kcal daily.  Monitor magnesium, potassium, and phosphorus daily for at least 3 days, MD to replete as needed, as pt is at risk for refeeding syndrome.  NUTRITION DIAGNOSIS:   Inadequate oral intake related to inability to eat as evidenced by NPO status.  GOAL:   Patient will meet greater than or equal to 90% of their needs  MONITOR:   Vent status,Labs,Weight trends,TF tolerance,I & O's  REASON FOR ASSESSMENT:   Ventilator,Consult Assessment of nutrition requirement/status,Enteral/tube feeding initiation and management  ASSESSMENT:   30 year old male with PMHx of asthma admitted with COVID-19 PNA and AKI.   2/1 intubated  Patient is currently intubated on ventilator support MV: 12.9 L/min Temp (24hrs), Avg:101.7 F (38.7 C), Min:100.9 F (38.3 C), Max:102.8 F (39.3 C)  Propofol: 20 ml/hr (528 kcal daily)  Medications reviewed and include: Colace 100 mg BID, free water 200 mL Q2hrs, Solu-medrol 40 mg Q12hrs IV, Miralax, azithromycin, ceftriaxone, D5-LR at 125 mL/hr, famotidine, fentanyl gtt, regular insulin gtt, norepinephrine gtt at 10 mcg/min, propofol gtt, remdesvir.  Labs reviewed: CBG 159-169, Sodium 157, Chloride 117, BUN 74, Creatinine 4.23.  I/O: 660 mL UOP overnight  Enteral Access: OGT; per repeat abdominal x-ray 2/2 terminates in distal aspect of gastric body  Discussed on rounds. Plan is to initiate tube feeds. Plan for repeat abdominal x-ray. Per admission note patient had poor PO intake PTA.  NUTRITION - FOCUSED PHYSICAL EXAM:  Deferred.  Diet Order:   Diet Order            Diet NPO time  specified  Diet effective now                EDUCATION NEEDS:   No education needs have been identified at this time  Skin:  Skin Assessment: Reviewed RN Assessment  Last BM:  Unknown/PTA  Height:   Ht Readings from Last 1 Encounters:  01/12/21 6' (1.829 m)   Weight:   Wt Readings from Last 1 Encounters:  01/13/21 (!) 164.9 kg   Ideal Body Weight:  80.9 kg  BMI:  Body mass index is 49.3 kg/m.  Estimated Nutritional Needs:   Kcal:  2250  Protein:  160-170 grams  Fluid:  >/= 2 L/day  Felix Pacini, MS, RD, LDN Pager number available on Amion

## 2021-01-13 NOTE — Progress Notes (Signed)
PHARMACY CONSULT NOTE - FOLLOW UP  Pharmacy Consult for Electrolyte Monitoring and Replacement   Recent Labs: Potassium (mmol/L)  Date Value  01/13/2021 4.6   Magnesium (mg/dL)  Date Value  16/09/9603 3.9 (H)   Calcium (mg/dL)  Date Value  54/08/8118 8.4 (L)   Albumin (g/dL)  Date Value  14/78/2956 2.2 (L)   Phosphorus (mg/dL)  Date Value  21/30/8657 5.6 (H)   Sodium (mmol/L)  Date Value  01/13/2021 157 (H)     Assessment: 30 year old male admitted with Covid and DKA. Started on an insulin drip. Pharmacy to manage electrolytes.  Goal of Therapy:  Electrolytes WNL  Plan:  Next BMP pending. Follow potassium closely while on insulin drip. BMP q6h for now.  Pricilla Riffle ,PharmD Clinical Pharmacist 01/13/2021 3:14 PM

## 2021-01-13 NOTE — Progress Notes (Signed)
PHARMACY CONSULT NOTE   Pharmacy Consult for Electrolyte Monitoring and Replacement   Recent Labs: Potassium (mmol/L)  Date Value  01/13/2021 5.7 (H)   Magnesium (mg/dL)  Date Value  53/74/8270 3.9 (H)   Calcium (mg/dL)  Date Value  78/67/5449 7.9 (L)   Albumin (g/dL)  Date Value  20/09/711 2.2 (L)   Phosphorus (mg/dL)  Date Value  19/75/8832 5.6 (H)   Sodium (mmol/L)  Date Value  01/13/2021 153 (H)    Assessment: 30 year old male admitted with COVID-19 pneumonia and DKA. Started on an insulin drip. Patient with hypoxic respiratory failure requiring intubation 2/1. Patient hypotensive and requiring blood pressure support with Levophed. Patient remains intubated sedated and on mechanical ventilation in the ICU. Pharmacy to manage electrolytes.  Nutrition: Tube feeds + free water flushes 200 mL q2h (2.4 L/day)  MIVF: D5LR at 125 mL/hr  Patient with worsening AKI since admission despite fluid resuscitation (+8.4L). Nephrology following and assessing for RRT. Insulin infusion last documented at 15 units / hr. Hypernatremia improving.  Goal of Therapy:  Electrolytes WNL  Plan:  2/2 at 2016 Na 153, K 4.8>5.7, Co2 21 >20, Scr 4.98 >5.41 --No electrolyte replacement warranted at this time --K+ rising while on insulin drip --Scr continues to rise --unknown baseline, nephrology following for possible RRT plans  --Continue to monitor BMP q6h while on insulin infusion. Next at 0400  Sharen Hones 01/13/2021 10:47 PM

## 2021-01-13 NOTE — Progress Notes (Signed)
Inpatient Diabetes Program Recommendations  AACE/ADA: New Consensus Statement on Inpatient Glycemic Control (2015)  Target Ranges:  Prepandial:   less than 140 mg/dL      Peak postprandial:   less than 180 mg/dL (1-2 hours)      Critically ill patients:  140 - 180 mg/dL   Results for Erik [name], Erik [name] (MRN [id number]) as of 01/13/2021 06:45  Ref. Range 01/12/2021 10:09  Sodium Latest Ref Range: 135 - 145 mmol/L 152 (H)  Potassium Latest Ref Range: 3.5 - 5.1 mmol/L 5.7 (H)  Chloride Latest Ref Range: 98 - 111 mmol/L 108  CO2 Latest Ref Range: 22 - 32 mmol/L 15 (L)  Glucose Latest Ref Range: 70 - 99 mg/dL 784 (HH)  BUN Latest Ref Range: 6 - 20 mg/dL 74 (H)  Creatinine Latest Ref Range: 0.61 - 1.24 mg/dL 6.96 (H)  Calcium Latest Ref Range: 8.9 - 10.3 mg/dL 8.6 (L)  Anion gap Latest Ref Range: 5 - 15  29 (H)   Results for Erik [name], Erik [name] (MRN [id number]) as of 01/13/2021 06:45  Ref. Range 01/12/2021 13:08  Hemoglobin A1C Latest Ref Range: 4.8 - 5.6 % 13.6 (H)    Admit with: DKA/ New Diagnosis DM/ COVID+/ ARDS/ Acute Renal Failure   Current Orders: IV Insulin Drip      Solumedrol 40 mg BID    MD- Note 3:19am BMET shows the following:  Glucose 253 mg/dl Anion Gap= 16 CO2 Level= 25  Note that Creatinine >3  Please leave on the IV Insulin Drip until CBGs are more stable.    When patient ready to transition to SQ Insulin, may consider using the Transition piece of the COVID-19 Glycemic Control Order set (choose transition from IV to SQ)    --Will follow patient during hospitalization--  Ambrose Finland RN, MSN, CDE Diabetes Coordinator Inpatient Glycemic Control Team Team Pager: [phone] (8a-5p)

## 2021-01-13 NOTE — Progress Notes (Signed)
PHARMACY CONSULT NOTE   Pharmacy Consult for Electrolyte Monitoring and Replacement   Recent Labs: Potassium (mmol/L)  Date Value  01/13/2021 4.8   Magnesium (mg/dL)  Date Value  24/40/1027 3.9 (H)   Calcium (mg/dL)  Date Value  25/36/6440 8.1 (L)   Albumin (g/dL)  Date Value  34/74/2595 2.2 (L)   Phosphorus (mg/dL)  Date Value  63/87/5643 5.6 (H)   Sodium (mmol/L)  Date Value  01/13/2021 153 (H)    Assessment: 30 year old male admitted with COVID-19 pneumonia and DKA. Started on an insulin drip. Patient with hypoxic respiratory failure requiring intubation 2/1. Patient hypotensive and requiring blood pressure support with Levophed. Patient remains intubated sedated and on mechanical ventilation in the ICU. Pharmacy to manage electrolytes.  Nutrition: Tube feeds + free water flushes 200 mL q2h (2.4 L/day)  MIVF: D5LR at 125 mL/hr  Patient with worsening AKI since admission despite fluid resuscitation (+8.4L). Nephrology following and assessing for RRT. Insulin infusion last documented at 15 units / hr. Hypernatremia improving.  Goal of Therapy:  Electrolytes WNL  Plan:  2/2 at 1504 Na 153, K 4.8, Co2 21, Scr 4.98 --No electrolyte replacement warranted at this time --Monitor closely for development of hyperkalemia given worsening renal function --Continue to monitor BMP q6h while on insulin infusion. Next at 2100  Tressie Ellis 01/13/2021 4:50 PM

## 2021-01-13 NOTE — Progress Notes (Signed)
GOALS OF CARE DISCUSSION  The Clinical status was relayed to family in detail. With Mother    Updated and notified of patients medical condition.  Patient remains unresponsive and will not open eyes to command.    Patient is having a weak cough and struggling to remove secretions.   Patient with increased WOB and using accessory muscles to breathe Explained to family course of therapy and the modalities     Patient with Progressive multiorgan failure with a very high probablity of a very minimal chance of meaningful recovery despite all aggressive and optimal medical therapy.   Family understands the situation.  Patient remains FULL CODE  Family are satisfied with Plan of action and management. All questions answered  Additional CC time 32 mins   Rei Contee Santiago Glad, M.D.  [facility]  Medical Director [facility] Medical Director [facility] Cardio-Pulmonary Department

## 2021-01-14 ENCOUNTER — Inpatient Hospital Stay: Payer: HRSA Program

## 2021-01-14 LAB — MAGNESIUM: Magnesium: 3.5 mg/dL — ABNORMAL HIGH (ref 1.7–2.4)

## 2021-01-14 LAB — GLUCOSE, CAPILLARY
Glucose-Capillary: 133 mg/dL — ABNORMAL HIGH (ref 70–99)
Glucose-Capillary: 136 mg/dL — ABNORMAL HIGH (ref 70–99)
Glucose-Capillary: 139 mg/dL — ABNORMAL HIGH (ref 70–99)
Glucose-Capillary: 143 mg/dL — ABNORMAL HIGH (ref 70–99)
Glucose-Capillary: 143 mg/dL — ABNORMAL HIGH (ref 70–99)
Glucose-Capillary: 147 mg/dL — ABNORMAL HIGH (ref 70–99)
Glucose-Capillary: 148 mg/dL — ABNORMAL HIGH (ref 70–99)
Glucose-Capillary: 155 mg/dL — ABNORMAL HIGH (ref 70–99)
Glucose-Capillary: 156 mg/dL — ABNORMAL HIGH (ref 70–99)
Glucose-Capillary: 166 mg/dL — ABNORMAL HIGH (ref 70–99)
Glucose-Capillary: 167 mg/dL — ABNORMAL HIGH (ref 70–99)
Glucose-Capillary: 168 mg/dL — ABNORMAL HIGH (ref 70–99)
Glucose-Capillary: 179 mg/dL — ABNORMAL HIGH (ref 70–99)
Glucose-Capillary: 180 mg/dL — ABNORMAL HIGH (ref 70–99)
Glucose-Capillary: 190 mg/dL — ABNORMAL HIGH (ref 70–99)

## 2021-01-14 LAB — COMPREHENSIVE METABOLIC PANEL
ALT: 11 U/L (ref 0–44)
AST: 26 U/L (ref 15–41)
Albumin: 1.9 g/dL — ABNORMAL LOW (ref 3.5–5.0)
Alkaline Phosphatase: 65 U/L (ref 38–126)
Anion gap: 13 (ref 5–15)
BUN: 75 mg/dL — ABNORMAL HIGH (ref 6–20)
CO2: 23 mmol/L (ref 22–32)
Calcium: 8.1 mg/dL — ABNORMAL LOW (ref 8.9–10.3)
Chloride: 119 mmol/L — ABNORMAL HIGH (ref 98–111)
Creatinine, Ser: 5.86 mg/dL — ABNORMAL HIGH (ref 0.61–1.24)
GFR, Estimated: 13 mL/min — ABNORMAL LOW (ref >60)
Glucose, Bld: 134 mg/dL — ABNORMAL HIGH (ref 70–99)
Potassium: 4.8 mmol/L (ref 3.5–5.1)
Sodium: 155 mmol/L — ABNORMAL HIGH (ref 135–145)
Total Bilirubin: 0.5 mg/dL (ref 0.3–1.2)
Total Protein: 6.3 g/dL — ABNORMAL LOW (ref 6.5–8.1)

## 2021-01-14 LAB — CBC WITH DIFFERENTIAL/PLATELET
Abs Immature Granulocytes: 0.18 10*3/uL — ABNORMAL HIGH (ref 0.00–0.07)
Basophils Absolute: 0 10*3/uL (ref 0.0–0.1)
Basophils Relative: 0 %
Eosinophils Absolute: 0 10*3/uL (ref 0.0–0.5)
Eosinophils Relative: 0 %
HCT: 43.9 % (ref 39.0–52.0)
Hemoglobin: 12.5 g/dL — ABNORMAL LOW (ref 13.0–17.0)
Immature Granulocytes: 1 %
Lymphocytes Relative: 7 %
Lymphs Abs: 1.1 10*3/uL (ref 0.7–4.0)
MCH: 25.6 pg — ABNORMAL LOW (ref 26.0–34.0)
MCHC: 28.5 g/dL — ABNORMAL LOW (ref 30.0–36.0)
MCV: 89.8 fL (ref 80.0–100.0)
Monocytes Absolute: 2 10*3/uL — ABNORMAL HIGH (ref 0.1–1.0)
Monocytes Relative: 13 %
Neutro Abs: 12.8 10*3/uL — ABNORMAL HIGH (ref 1.7–7.7)
Neutrophils Relative %: 79 %
Platelets: 274 10*3/uL (ref 150–400)
RBC: 4.89 MIL/uL (ref 4.22–5.81)
RDW: 14.1 % (ref 11.5–15.5)
Smear Review: NORMAL
WBC: 16 10*3/uL — ABNORMAL HIGH (ref 4.0–10.5)
nRBC: 0 % (ref 0.0–0.2)

## 2021-01-14 LAB — BASIC METABOLIC PANEL
Anion gap: 14 (ref 5–15)
Anion gap: 14 (ref 5–15)
BUN: 77 mg/dL — ABNORMAL HIGH (ref 6–20)
BUN: 78 mg/dL — ABNORMAL HIGH (ref 6–20)
CO2: 23 mmol/L (ref 22–32)
CO2: 19 mmol/L — ABNORMAL LOW (ref 22–32)
Calcium: 7.9 mg/dL — ABNORMAL LOW (ref 8.9–10.3)
Calcium: 7.6 mg/dL — ABNORMAL LOW (ref 8.9–10.3)
Chloride: 116 mmol/L — ABNORMAL HIGH (ref 98–111)
Chloride: 115 mmol/L — ABNORMAL HIGH (ref 98–111)
Creatinine, Ser: 6.48 mg/dL — ABNORMAL HIGH (ref 0.61–1.24)
Creatinine, Ser: 6.4 mg/dL — ABNORMAL HIGH (ref 0.61–1.24)
GFR, Estimated: 11 mL/min — ABNORMAL LOW (ref >60)
GFR, Estimated: 11 mL/min — ABNORMAL LOW (ref >60)
Glucose, Bld: 181 mg/dL — ABNORMAL HIGH (ref 70–99)
Glucose, Bld: 200 mg/dL — ABNORMAL HIGH (ref 70–99)
Potassium: 4.5 mmol/L (ref 3.5–5.1)
Potassium: 5.1 mmol/L (ref 3.5–5.1)
Sodium: 153 mmol/L — ABNORMAL HIGH (ref 135–145)
Sodium: 148 mmol/L — ABNORMAL HIGH (ref 135–145)

## 2021-01-14 LAB — C-PEPTIDE: C-Peptide: 1 ng/mL — ABNORMAL LOW (ref 1.1–4.4)

## 2021-01-14 LAB — PHOSPHORUS
Phosphorus: 5.7 mg/dL — ABNORMAL HIGH (ref 2.5–4.6)
Phosphorus: 6.3 mg/dL — ABNORMAL HIGH (ref 2.5–4.6)

## 2021-01-14 LAB — GLUTAMIC ACID DECARBOXYLASE AUTO ABS: Glutamic Acid Decarb Ab: <5 U/mL (ref 0.0–5.0)

## 2021-01-14 LAB — FIBRIN DERIVATIVES D-DIMER (ARMC ONLY): Fibrin derivatives D-dimer (ARMC): 2198.38 ng/mL (FEU) — ABNORMAL HIGH (ref 0.00–499.00)

## 2021-01-14 LAB — ALBUMIN: Albumin: 1.6 g/dL — ABNORMAL LOW (ref 3.5–5.0)

## 2021-01-14 LAB — C-REACTIVE PROTEIN: CRP: 20.6 mg/dL — ABNORMAL HIGH (ref <1.0)

## 2021-01-14 LAB — TRIGLYCERIDES: Triglycerides: 410 mg/dL — ABNORMAL HIGH (ref <150)

## 2021-01-14 LAB — CK: Total CK: 588 U/L — ABNORMAL HIGH (ref 49–397)

## 2021-01-14 MED ORDER — PRISMASOL BGK 0/2.5 32-2.5 MEQ/L EC SOLN
Status: DC
  Filled 2021-01-14: qty 5000

## 2021-01-14 MED ORDER — SODIUM CHLORIDE 0.9 % FOR CRRT
INTRAVENOUS_CENTRAL | Status: DC | PRN
  Filled 2021-01-14: qty 1000

## 2021-01-14 MED ORDER — IBUPROFEN 100 MG/5ML PO SUSP
200.0000 mg | Freq: Three times a day (TID) | ORAL | Status: DC | PRN
  Filled 2021-01-14 (×2): qty 10

## 2021-01-14 MED ORDER — HEPARIN (PORCINE) IN NACL 2-0.9 UNITS/ML
INTRAMUSCULAR | Status: DC
  Filled 2021-01-14 (×11): qty 2000

## 2021-01-14 MED ORDER — HEPARIN (PORCINE) IN NACL 2-0.9 UNITS/ML
Freq: Once | INTRAMUSCULAR | Status: AC
  Filled 2021-01-14 (×7): qty 1000

## 2021-01-14 MED ORDER — HEPARIN SODIUM (PORCINE) 1000 UNIT/ML DIALYSIS
1000.0000 [IU] | INTRAMUSCULAR | Status: DC | PRN
  Administered 2021-01-15 – 2021-01-20 (×2): 3600 [IU] via INTRAVENOUS_CENTRAL
  Filled 2021-01-14: qty 3
  Filled 2021-01-14: qty 4
  Filled 2021-01-14: qty 6
  Filled 2021-01-14: qty 3
  Filled 2021-01-14 (×2): qty 1
  Filled 2021-01-14: qty 4
  Filled 2021-01-14: qty 1
  Filled 2021-01-14: qty 3
  Filled 2021-01-14 (×2): qty 6
  Filled 2021-01-14: qty 3

## 2021-01-14 MED ORDER — ACETAMINOPHEN 160 MG/5ML PO SOLN
650.0000 mg | ORAL | Status: DC | PRN
  Administered 2021-01-14 (×2): 650 mg
  Filled 2021-01-14 (×3): qty 20.3

## 2021-01-14 MED ORDER — FAMOTIDINE IN NACL 20-0.9 MG/50ML-% IV SOLN
20.0000 mg | INTRAVENOUS | Status: DC
  Administered 2021-01-15 – 2021-01-26 (×12): 20 mg via INTRAVENOUS
  Filled 2021-01-14 (×12): qty 50

## 2021-01-14 MED ORDER — HEPARIN (PORCINE) IN NACL 2-0.9 UNITS/ML
INTRAMUSCULAR | Status: AC
  Filled 2021-01-14 (×5): qty 1000

## 2021-01-14 NOTE — Progress Notes (Signed)
PHARMACY CONSULT NOTE   Pharmacy Consult for Electrolyte Monitoring and Replacement   Recent Labs: Potassium (mmol/L)  Date Value  01/14/2021 4.8   Magnesium (mg/dL)  Date Value  15/17/6160 3.5 (H)   Calcium (mg/dL)  Date Value  73/71/0626 8.1 (L)   Albumin (g/dL)  Date Value  94/85/4627 1.9 (L)   Phosphorus (mg/dL)  Date Value  03/50/0938 5.7 (H)   Sodium (mmol/L)  Date Value  01/14/2021 155 (H)    Assessment: 30 year old male admitted with COVID-19 pneumonia and DKA. Started on an insulin drip. Patient with hypoxic respiratory failure requiring intubation 2/1. Patient hypotensive and requiring blood pressure support with Levophed. Patient remains intubated sedated and on mechanical ventilation in the ICU. Pharmacy to manage electrolytes.  Nutrition: Tube feeds + free water flushes 200 mL q2h (2.4 L/day)  MIVF: D5LR at 125 mL/hr  Patient with worsening AKI since admission despite fluid resuscitation (+8.4L). Nephrology following and assessing for RRT. Insulin infusion last documented at 15 units / hr. Hypernatremia improving.  Goal of Therapy:  Electrolytes WNL  Plan:  2/3:  All electrolytes WNL @ 0230.   No additional electrolyte supplementation needed at this time.   Will recheck electrolytes on 2/3 @ ~ 0630.   Marsha Hillman D 01/14/2021 4:16 AM

## 2021-01-14 NOTE — Progress Notes (Signed)
BP dropped with starting HD Cathter with poor BFR  Plan ICU team to exchange cathter Start CVVHD

## 2021-01-14 NOTE — Progress Notes (Signed)
[facility], [place] 01/14/21  Subjective:   Hospital day # 2 Neuro: sedated with fentanyl, propofol  cvs: requiring pressors (levophed) Pulm: vent assisted; fio2 35% (improving) Gi: TF started; insulin drip is being weaned off Renal:  02/02 0701 - 02/03 0700 In: 8467.9 [I.V.:4453.4; KG/MW:1027.2; IV Piggyback:500.2] Out: 67 [Urine:67] Lab Results  Component Value Date   CREATININE 5.86 (H) 01/14/2021   CREATININE 5.41 (H) 01/13/2021   CREATININE 4.98 (H) 01/13/2021   S Creatinine and uop are worse  Objective:  Vital signs in last 24 hours:  Temp:  [101.48 F (38.6 C)-102.4 F (39.1 C)] 101.66 F (38.7 C) (02/03 0900) Pulse Rate:  [84-107] 89 (02/03 0900) Resp:  [12-26] 25 (02/03 0900) BP: (86-132)/(45-83) 101/63 (02/03 0900) SpO2:  [92 %-100 %] 100 % (02/03 0900) FiO2 (%):  [35 %-70 %] 35 % (02/03 0800)  Weight change:  Filed Weights   01/12/21 1205 01/13/21 0402  Weight: (!) 159.6 kg (!) 164.9 kg    Intake/Output:    Intake/Output Summary (Last 24 hours) at 01/14/2021 0907 Last data filed at 01/14/2021 5366 Gross per 24 hour  Intake 9169.8 ml  Output 72 ml  Net 9097.8 ml    Physical Exam: General:  ill appearing  HEENT ETT in place,  Neck:  rt IJ temp cath  Lungs: Vent assited, coarse  Heart:: Tachycardic, regular   Abdomen: soft  Extremities:  no edema  Neurologic: sedated  Skin: warm  Access: Rt IJ temp cath  Foley: present      Basic Metabolic Panel:  Recent Labs  Lab 01/13/21 0319 01/13/21 0851 01/13/21 1504 01/13/21 2016 01/14/21 0230  NA 158* 157* 153* 153* 155*  K 4.7 4.6 4.8 5.7* 4.8  CL 117* 117* 115* 119* 119*  CO2 25 23 21* 20* 23  GLUCOSE 253* 183* 282* 254* 134*  BUN 73* 74* 71* 75* 75*  CREATININE 3.66* 4.23* 4.98* 5.41* 5.86*  CALCIUM 8.3* 8.4* 8.1* 7.9* 8.1*  MG 3.9*  --   --   --  3.5*  PHOS 5.6*  --   --   --  5.7*     CBC: Recent Labs  Lab 01/12/21 0839 01/12/21 1308  01/13/21 0319 01/14/21 0230  WBC 17.3* 17.9* 15.0* 16.0*  NEUTROABS 14.1* 14.9* 10.7* 12.8*  HGB 15.8 15.3 15.3 12.5*  HCT 53.9* 53.5* 51.1 43.9  MCV 87.1 88.9 86.8 89.8  PLT 447* 441* 403* 274     No results found for: HEPBSAG, HEPBSAB, HEPBIGM    Microbiology:  Recent Results (from the past 240 hour(s))  Blood culture (routine single)     Status: None (Preliminary result)   Collection Time: 01/12/21  9:05 AM   Specimen: BLOOD  Result Value Ref Range Status   Specimen Description BLOOD LEFT AC  Final   Special Requests   Final    BOTTLES DRAWN AEROBIC ONLY Blood Culture results may not be optimal due to an inadequate volume of blood received in culture bottles   Culture   Final    NO GROWTH 2 DAYS Performed at [facility], [address], [place], [place] [address]    Report Status PENDING  Incomplete  SARS Coronavirus 2 by RT PCR (hospital order, performed in [place] hospital lab) Nasopharyngeal Nasopharyngeal Swab     Status: Abnormal   Collection Time: 01/12/21  9:36 AM   Specimen: Nasopharyngeal Swab  Result Value Ref Range Status   SARS Coronavirus 2 POSITIVE (A) NEGATIVE Final  Comment: RESULT CALLED TO, READ BACK BY AND VERIFIED WITH: ANNA JASPER ON 01/12/21 AT 16 SDR (NOTE) SARS-CoV-2 target nucleic acids are DETECTED  SARS-CoV-2 RNA is generally detectable in upper respiratory specimens  during the acute phase of infection.  Positive results are indicative  of the presence of the identified virus, but do not rule out bacterial infection or co-infection with other pathogens not detected by the test.  Clinical correlation with patient history and  other diagnostic information is necessary to determine patient infection status.  The expected result is negative.  Fact Sheet for Patients:   StrictlyIdeas.no   Fact Sheet for Healthcare Providers:   BankingDealers.co.za    This test is not yet  approved or cleared by the [place] FDA and  has been authorized for detection and/or diagnosis of SARS-CoV-2 by FDA under an Emergency Use Authorization (EUA).  This EUA will remain in effect (meaning this tes t can be used) for the duration of  the COVID-19 declaration under Section 564(b)(1) of the Act, 21 U.S.C. section 360-bbb-3(b)(1), unless the authorization is terminated or revoked sooner.  Performed at [facility], [address] [place], [place] [address]   Culture, blood (single)     Status: None (Preliminary result)   Collection Time: 01/12/21  1:08 PM   Specimen: BLOOD  Result Value Ref Range Status   Specimen Description BLOOD BLOOD RIGHT HAND  Final   Special Requests   Final    BOTTLES DRAWN AEROBIC AND ANAEROBIC Blood Culture adequate volume   Culture   Final    NO GROWTH 2 DAYS Performed at [facility], [address], [place], [place] [address]    Report Status PENDING  Incomplete  MRSA PCR Screening     Status: None   Collection Time: 01/12/21  7:22 PM   Specimen: Nasopharyngeal  Result Value Ref Range Status   MRSA by PCR NEGATIVE NEGATIVE Final    Comment:        The GeneXpert MRSA Assay (FDA approved for NASAL specimens only), is one component of a comprehensive MRSA colonization surveillance program. It is not intended to diagnose MRSA infection nor to guide or monitor treatment for MRSA infections. Performed at [facility], [address] [place], [place] [address]   Culture, respiratory (non-expectorated)     Status: None (Preliminary result)   Collection Time: 01/13/21  3:04 PM   Specimen: Tracheal Aspirate; Respiratory  Result Value Ref Range Status   Specimen Description   Final    TRACHEAL ASPIRATE Performed at [facility], [address] [place], [place] [address]    Special Requests   Final    NONE Performed at [facility], [address] [place], [place] [address]     Gram Stain   Final    ABUNDANT WBC PRESENT, PREDOMINANTLY PMN MODERATE GRAM POSITIVE COCCI Performed at [facility], [address], [place], [place] [address]    Culture PENDING  Incomplete   Report Status PENDING  Incomplete    Coagulation Studies: Recent Labs    01/12/21 0902 01/12/21 1009  LABPROT SPECIMEN CLOTTED 14.3  INR SPECIMEN CLOTTED 1.2    Urinalysis: Recent Labs    01/13/21 1046  COLORURINE AMBER*  LABSPEC 1.027  PHURINE 5.0  GLUCOSEU 50*  HGBUR SMALL*  BILIRUBINUR NEGATIVE  KETONESUR 5*  PROTEINUR 30*  NITRITE NEGATIVE  LEUKOCYTESUR TRACE*      Imaging: DG Abd 1 View  Result Date: 01/13/2021 CLINICAL  DATA:  Orogastric tube placement EXAM: ABDOMEN - 1 VIEW COMPARISON:  01/12/2021 FINDINGS: Limited view of the lower chest and upper abdomen was performed for the purposes of enteric tube localization. Enteric tube courses below the diaphragm with distal tip extending into the distal aspect of the gastric body. Multiple overlying cardiac leads. Cardiomegaly. IMPRESSION: Enteric tube courses below the diaphragm with distal tip extending into the distal aspect of the gastric body. Electronically Signed   By: Davina Poke D.O.   On: 01/13/2021 11:31   DG Abdomen 1 View  Result Date: 01/12/2021 CLINICAL DATA:  NG tube placement. EXAM: ABDOMEN - 1 VIEW COMPARISON:  None. FINDINGS: Tip of the enteric tube is below the diaphragm in the stomach, the side port is in the distal esophagus. Advancement of at least 7 cm is recommended for optimal placement. Normal bowel gas pattern in the included upper abdomen. IMPRESSION: Tip of the enteric tube below the diaphragm in the stomach, the side port in the distal esophagus. Advancement of at least 7 cm recommended for optimal placement. Electronically Signed   By: Keith Rake M.D.   On: 01/12/2021 18:03   US RENAL  Result Date: 01/13/2021 CLINICAL DATA:  Acute kidney injury. EXAM: RENAL / URINARY TRACT ULTRASOUND  COMPLETE COMPARISON:  None. FINDINGS: Right Kidney: Renal measurements: 13.7 x 6.6 x 7.6 cm = volume: 367 mL. No hydronephrosis. Grossly normal renal parenchymal echogenicity. Normal renal cortical thickness. No obvious focal renal lesion. Left Kidney: Renal measurements: 12.6 x 7.0 x 6.3 cm = volume: 290 mL. No hydronephrosis. Grossly normal renal parenchymal echogenicity. Normal renal cortical thickness. No obvious focal lesion. Bladder: Decompressed by Foley catheter. Other: Incidental note of hepatic steatosis. Technically limited exam due to patient body habitus and medical status on ventilator. IMPRESSION: Unremarkable renal ultrasound.  No obstructive uropathy. Electronically Signed   By: Keith Rake M.D.   On: 01/13/2021 19:30   DG Chest Port 1 View  Result Date: 01/12/2021 CLINICAL DATA:  Central line placement. EXAM: PORTABLE CHEST 1 VIEW COMPARISON:  Earlier today. FINDINGS: Right-sided central line tip projects over the mid SVC. No pneumothorax. Endotracheal tube appears low in positioning 9 mm from the carina. Enteric tube in place with side-port now in the stomach below the diaphragm. Persistent volume loss at the left lung base with dense airspace opacity. Patchy opacity in the left perihilar region and right mid lung are similar. IMPRESSION: 1. Tip of the right central line projects over the mid SVC. No pneumothorax. 2. Endotracheal tube appears low in positioning 9 mm from the carina. Recommend retraction of 1-2 cm. 3. Enteric tube in place with side-port now in the stomach below the diaphragm. 4. Dense retrocardiac opacity, similar to prior. Additional patchy opacities throughout both lungs not significantly changed. Electronically Signed   By: Keith Rake M.D.   On: 01/12/2021 23:36   DG Chest Portable 1 View  Result Date: 01/12/2021 CLINICAL DATA:  Intubation. EXAM: PORTABLE CHEST 1 VIEW COMPARISON:  Chest radiograph earlier today. FINDINGS: Endotracheal tube tip is 2.4 cm from  the carina. Enteric tube is in place tip below the diaphragm, side-port in the distal esophagus, this is been slightly advanced on subsequent abdominal radiograph. Patient is rotated. Increasing volume loss and opacity at the left lung base. Streaky opacities in the right lower lung zone. Heart size grossly normal, not well assessed. No pneumothorax. Limited assessment for pleural effusion on the current exam. IMPRESSION: 1. Endotracheal tube tip 2.4 cm from the carina. 2.  Enteric tube tip below the diaphragm, side-port in the distal esophagus. 3. Increasing volume loss and opacity at the left lung base, likely atelectasis and or lobar collapse/mucous plugging. Progressive is also considered. Streaky opacities in the right lower lung zone may be atelectasis or pneumonia. Electronically Signed   By: Keith Rake M.D.   On: 01/12/2021 18:02   DG Chest Portable 1 View  Result Date: 01/12/2021 CLINICAL DATA:  Shortness of breath. EXAM: PORTABLE CHEST 1 VIEW COMPARISON:  No prior. FINDINGS: Cardiomegaly. Low lung volumes. Bilateral pulmonary infiltrates/edema. No prominent pleural effusion. No pneumothorax. No acute bony abnormality. IMPRESSION: 1. Cardiomegaly. 2. Low lung volumes. Bilateral pulmonary infiltrates/edema. Electronically Signed   By: Marcello Moores  Register   On: 01/12/2021 09:16     Medications:   . sodium chloride    . azithromycin Stopped (01/13/21 1034)  . cefTRIAXone (ROCEPHIN)  IV Stopped (01/13/21 1004)  . dextrose 5% lactated ringers 50 mL/hr at 01/14/21 0903  . famotidine (PEPCID) IV Stopped (01/13/21 2306)  . feeding supplement (VITAL 1.5 CAL) 35 mL/hr at 01/14/21 0300  . fentaNYL infusion INTRAVENOUS 100 mcg/hr (01/14/21 0903)  . insulin 10 mL/hr at 01/14/21 0903  . norepinephrine (LEVOPHED) Adult infusion 7 mcg/min (01/13/21 2016)  . propofol (DIPRIVAN) infusion 15 mcg/kg/min (01/14/21 0903)  . remdesivir 100 mg in NS 100 mL 200 mL/hr at 01/14/21 0903   . chlorhexidine  gluconate (MEDLINE KIT)  15 mL Mouth Rinse BID  . Chlorhexidine Gluconate Cloth  6 each Topical Daily  . docusate  100 mg Per Tube BID  . feeding supplement (PROSource TF)  90 mL Per Tube QID  . free water  200 mL Per Tube Q2H  . heparin  5,000 Units Subcutaneous Q8H  . mouth rinse  15 mL Mouth Rinse 10 times per day  . methylPREDNISolone (SOLU-MEDROL) injection  40 mg Intravenous Q12H  . polyethylene glycol  17 g Per Tube Daily  . sodium chloride flush  3 mL Intravenous Q12H   sodium chloride, acetaminophen (TYLENOL) oral liquid 160 mg/5 mL, acetaminophen, dextrose, docusate sodium, fentaNYL, midazolam, midazolam, ondansetron **OR** ondansetron (ZOFRAN) IV, ondansetron (ZOFRAN) IV, polyethylene glycol, sodium chloride flush  Assessment/ Plan:  30 y.o. male with h/o Asthma, no recent medical care , was  admitted on 01/12/2021 for ARDS (adult respiratory distress syndrome) ([place]) [J80] Acute respiratory failure with hypoxia ([place]) [J96.01] Acute hypoxemic respiratory failure due to COVID-19 ([facility]) [U07.1, J96.01] Hyperosmolar hyperglycemic state (HHS) ([place]) [E11.00, E11.65] Pneumonia due to COVID-19 virus [U07.1, J12.82]  # AKI Baseline creatinine is unknown U/a- 6-10 RBCs, 11-20 WBCs, UPC of 0.24 US renal - unremarkable, no obstruction UOP is poor S Creatinine trends are worsening High BUN Patient would benefit from hemodialysis for metabolic optimization discussed with mother by phone- consent obtained She has agreed to proceed - orders prepared Will continue to reassess for need of HD daily  # hypernatremia - getting free water flushed  # New Dx of Diabetes, uncontrolled Lab Results  Component Value Date   HGBA1C 13.6 (H) 01/12/2021   requiring insulin drip C-peptide and antiGAD abs results pending  # Acute respiratory failure # Covid 19-infection  Management as per ICU team Currently Vent dependent    LOS: [place] 2/3/20229:07 AM  [facility], [place]  Note: This note was prepared with Dragon dictation. Any transcription errors are unintentional

## 2021-01-14 NOTE — Progress Notes (Signed)
Brief Pharmacy Note  Patient to start on CRRT per Nephrology. Chart reviewed. No medication adjustments at this time. Continue to monitor closely.  Laureen Ochs, PharmD

## 2021-01-14 NOTE — Progress Notes (Addendum)
CRRT initiated at 1845 by day shift.   CRRT had to be stopped at 2000 due to [facility] error. Blood was unable to be returned.  CRRT restarted at 2100 without issue.   CRRT had to be stopped at 2300 due to increased filter pressures. Blood was returned. Awaiting heparin from pharmacy to prime the filter and restart therapy.  CRRT restarted at [place] without issue. It appears that the filter pressure is slowly increasing. Will continue to monitor and assess for need to return blood.  CRRT stopped at 0220 due to increased filter pressures and extremely negative return pressures. Blood was unable to be returned. Upon flushing return line, it was discovered that there was no blood return, indicating a potential clot in the line. Cathflo inserted into the return line to try and remedy this issue. Access line heparin locked. Cathflo will remain in the line for two hours, and therapy will resume at 0445.   CRRT initiated once again at 0500 and was immediately met with access alarms. Therapy unable to continue due to blood flow issues. Blood was returned and Hinton Dyer, NP was notified of the issue. A new temporary HD line was placed in the left IJ. Awaiting heparin infusion for CRRT priming and new Oxiris filter before attempting therapy. New lines heparin locked during the wait. Will continue to monitor.  Cameron Ali, RN

## 2021-01-14 NOTE — Progress Notes (Addendum)
PHARMACY CONSULT NOTE   Pharmacy Consult for Electrolyte Monitoring and Replacement   Recent Labs: Potassium (mmol/L)  Date Value  01/14/2021 5.1   Magnesium (mg/dL)  Date Value  73/42/8768 3.5 (H)   Calcium (mg/dL)  Date Value  11/57/2620 7.6 (L)   Albumin (g/dL)  Date Value  35/59/7416 1.6 (L)   Phosphorus (mg/dL)  Date Value  38/45/3646 6.3 (H)   Sodium (mmol/L)  Date Value  01/14/2021 148 (H)    Assessment: 30 year old male admitted with COVID-19 pneumonia and DKA. Started on an insulin drip. Patient with hypoxic respiratory failure requiring intubation 2/1. Patient hypotensive and requiring blood pressure support with Levophed. Patient remains intubated sedated and on mechanical ventilation in the ICU. Pharmacy to manage electrolytes.  Nutrition: Tube feeds + free water flushes 200 mL q2h (2.4 L/day)  MIVF: D5LR at 125 mL/hr  Patient with worsening AKI since admission despite fluid resuscitation. Nephrology following. Starting CRRT 2/3.  Goal of Therapy:  Electrolytes WNL  Plan:  Potassium has remained on the upper end of normal today. Patient is starting on CRRT today. Plan to keep on insulin drip until rate more stable. Do not expect patient will need potassium supplementation. Labs currently ordered BID per CRRT. Do not see need for checking more frequently at this time. Continue to follow along.  Pricilla Riffle, PharmD 01/14/2021 3:00 PM

## 2021-01-14 NOTE — Progress Notes (Signed)
CRITICAL CARE NOTE 30 yo morbidly obese male with ASTHMA now with progressive multiorgan failure with COVID 19 pneumonia and ARDS with severe acidosis and severe hypoxia with ARDS with acute renal failure  2/1 Emergently intubated 2/2 severe resp failure, vasc cath placed 2/3 severe resp failure   CC  follow up respiratory failure  SUBJECTIVE Patient remains critically ill Prognosis is guarded  Vent Mode: PRVC FiO2 (%):  [50 %-90 %] 50 % Set Rate:  [25 bmp] 25 bmp Vt Set:  [550 mL] 550 mL PEEP:  [5 cmH20-10 cmH20] 10 cmH20 Plateau Pressure:  [18 cmH20-20 cmH20] 20 cmH20 CBC    Component Value Date/Time   WBC 16.0 (H) 01/14/2021 0230   RBC 4.89 01/14/2021 0230   HGB 12.5 (L) 01/14/2021 0230   HCT 43.9 01/14/2021 0230   PLT 274 01/14/2021 0230   MCV 89.8 01/14/2021 0230   MCH 25.6 (L) 01/14/2021 0230   MCHC 28.5 (L) 01/14/2021 0230   RDW 14.1 01/14/2021 0230   LYMPHSABS 1.1 01/14/2021 0230   MONOABS 2.0 (H) 01/14/2021 0230   EOSABS 0.0 01/14/2021 0230   BASOSABS 0.0 01/14/2021 0230   BMP Latest Ref Rng & Units 01/14/2021 01/13/2021 01/13/2021  Glucose 70 - 99 mg/dL 884(Z) 660(Y) 301(S)  BUN 6 - 20 mg/dL 01(U) 93(A) 35(T)  Creatinine 0.61 - 1.24 mg/dL 7.32(K) 0.25(K) 2.70(W)  Sodium 135 - 145 mmol/L 155(H) 153(H) 153(H)  Potassium 3.5 - 5.1 mmol/L 4.8 5.7(H) 4.8  Chloride 98 - 111 mmol/L 119(H) 119(H) 115(H)  CO2 22 - 32 mmol/L 23 20(L) 21(L)  Calcium 8.9 - 10.3 mg/dL 8.1(L) 7.9(L) 8.1(L)    BP 116/74   Pulse 87   Temp (!) 101.8 F (38.8 C)   Resp (!) 25   Ht 6' (1.829 m)   Wt (!) 164.9 kg   SpO2 100%   BMI 49.30 kg/m    I/O last 3 completed shifts: In: 10962.1 [I.V.:6897.5; CB/JS:2831.5; IV Piggyback:550.2] Out: 727 [Urine:727] No intake/output data recorded.  SpO2: 100 % O2 Flow Rate (L/min): 7 L/min FiO2 (%): 50 %  Estimated body mass index is 49.3 kg/m as calculated from the following:   Height as of this encounter: 6' (1.829 m).   Weight as of  this encounter: 164.9 kg.  SIGNIFICANT EVENTS   REVIEW OF SYSTEMS  PATIENT IS UNABLE TO PROVIDE COMPLETE REVIEW OF SYSTEMS DUE TO SEVERE CRITICAL ILLNESS       PHYSICAL EXAMINATION: GENERAL:critically ill appearing, +resp distress NECK: Supple.  PULMONARY: +rhonchi, +wheezing CARDIOVASCULAR: S1 and S2.  GASTROINTESTINAL: Soft, nontender, +Positive bowel sounds.  MUSCULOSKELETAL: No swelling, clubbing, or edema.  NEUROLOGIC: obtunded, GCS<8 SKIN:intact,warm,dry     MEDICATIONS: I have reviewed all medications and confirmed regimen as documented   CULTURE RESULTS   Recent Results (from the past 240 hour(s))  Blood culture (routine single)     Status: None (Preliminary result)   Collection Time: 01/12/21  9:05 AM   Specimen: BLOOD  Result Value Ref Range Status   Specimen Description BLOOD LEFT AC  Final   Special Requests   Final    BOTTLES DRAWN AEROBIC ONLY Blood Culture results may not be optimal due to an inadequate volume of blood received in culture bottles   Culture   Final    NO GROWTH 2 DAYS Performed at [facility], [address], [place], [place] [address]    Report Status PENDING  Incomplete  SARS Coronavirus 2 by RT PCR (hospital order, performed in [facility] hospital lab) Nasopharyngeal Nasopharyngeal Swab     Status: Abnormal   Collection Time: 01/12/21  9:36 AM   Specimen: Nasopharyngeal Swab  Result Value Ref Range Status   SARS Coronavirus 2 POSITIVE (A) NEGATIVE Final    Comment: RESULT CALLED TO, READ BACK BY AND VERIFIED WITH: ANNA JASPER ON 01/12/21 AT 1111 SDR (NOTE) SARS-CoV-2 target nucleic acids are DETECTED  SARS-CoV-2 RNA is generally detectable in upper respiratory specimens  during the acute phase of infection.  Positive results are indicative  of the presence of the identified virus, but do not rule out bacterial infection or co-infection with other pathogens not detected by the test.  Clinical correlation with patient  history and  other diagnostic information is necessary to determine patient infection status.  The expected result is negative.  Fact Sheet for Patients:   BoilerBrush.com.cy   Fact Sheet for Healthcare Providers:   https://pope.com/    This test is not yet approved or cleared by the [place] FDA and  has been authorized for detection and/or diagnosis of SARS-CoV-2 by FDA under an Emergency Use Authorization (EUA).  This EUA will remain in effect (meaning this tes t can be used) for the duration of  the COVID-19 declaration under Section 564(b)(1) of the Act, 21 U.S.C. section 360-bbb-3(b)(1), unless the authorization is terminated or revoked sooner.  Performed at [facility], [address] [place], [place] [address]   Culture, blood (single)     Status: None (Preliminary result)   Collection Time: 01/12/21  1:08 PM   Specimen: BLOOD  Result Value Ref Range Status   Specimen Description BLOOD BLOOD RIGHT HAND  Final   Special Requests   Final    BOTTLES DRAWN AEROBIC AND ANAEROBIC Blood Culture adequate volume   Culture   Final    NO GROWTH 2 DAYS Performed at [facility], [address], [place], [place] [address]    Report Status PENDING  Incomplete  MRSA PCR Screening     Status: None   Collection Time: 01/12/21  7:22 PM   Specimen: Nasopharyngeal  Result Value Ref Range Status   MRSA by PCR NEGATIVE NEGATIVE Final    Comment:        The GeneXpert MRSA Assay (FDA approved for NASAL specimens only), is one component of a comprehensive MRSA colonization surveillance program. It is not intended to diagnose MRSA infection nor to guide or monitor treatment for MRSA infections. Performed at [facility], [address] [place], [place] [address]   Culture, respiratory (non-expectorated)     Status: None (Preliminary result)   Collection Time: 01/13/21  3:04 PM   Specimen: Tracheal  Aspirate; Respiratory  Result Value Ref Range Status   Specimen Description   Final    TRACHEAL ASPIRATE Performed at [facility], [address], [place], [place] [address]    Special Requests   Final    NONE Performed at [facility], [address] [place], [place] [address]    Gram Stain   Final    ABUNDANT WBC PRESENT, PREDOMINANTLY PMN MODERATE GRAM POSITIVE COCCI Performed at [facility], [address], [place], [place] [address]    Culture PENDING  Incomplete   Report Status PENDING  Incomplete          IMAGING    DG Abd 1 View  Result Date: 01/13/2021 CLINICAL DATA:  Orogastric tube placement EXAM: ABDOMEN - 1 VIEW COMPARISON:  01/12/2021 FINDINGS: Limited  view of the lower chest and upper abdomen was performed for the purposes of enteric tube localization. Enteric tube courses below the diaphragm with distal tip extending into the distal aspect of the gastric body. Multiple overlying cardiac leads. Cardiomegaly. IMPRESSION: Enteric tube courses below the diaphragm with distal tip extending into the distal aspect of the gastric body. Electronically Signed   By: Duanne Guess D.O.   On: 01/13/2021 11:31   US RENAL  Result Date: 01/13/2021 CLINICAL DATA:  Acute kidney injury. EXAM: RENAL / URINARY TRACT ULTRASOUND COMPLETE COMPARISON:  None. FINDINGS: Right Kidney: Renal measurements: 13.7 x 6.6 x 7.6 cm = volume: 367 mL. No hydronephrosis. Grossly normal renal parenchymal echogenicity. Normal renal cortical thickness. No obvious focal renal lesion. Left Kidney: Renal measurements: 12.6 x 7.0 x 6.3 cm = volume: 290 mL. No hydronephrosis. Grossly normal renal parenchymal echogenicity. Normal renal cortical thickness. No obvious focal lesion. Bladder: Decompressed by Foley catheter. Other: Incidental note of hepatic steatosis. Technically limited exam due to patient body habitus and medical status on ventilator. IMPRESSION: Unremarkable renal  ultrasound.  No obstructive uropathy. Electronically Signed   By: Narda Rutherford M.D.   On: 01/13/2021 19:30     Nutrition Status: Nutrition Problem: Inadequate oral intake Etiology: inability to eat Signs/Symptoms: NPO status Interventions: Tube feeding,Prostat     Indwelling Urinary Catheter continued, requirement due to   Reason to continue Indwelling Urinary Catheter strict Intake/Output monitoring for hemodynamic instability   Central Line/ continued, requirement due to  Reason to continue [employer] Monitoring of central venous pressure or other hemodynamic parameters and poor IV access   Ventilator continued, requirement due to severe respiratory failure   Ventilator Sedation RASS 0 to -2      ASSESSMENT AND PLAN SYNOPSIS 30 yo morbidly obese male with ASTHMA now with progressive multiorgan failure with COVID 19 pneumonia and ARDS with severe acidosis and severe hypoxia with ARDS with acute renal failure    Acute hypoxemic respiratory failure due to COVID-19 pneumonia/ARDS Mechanical ventilation via ARDS protocol, target PRVC 6 cc/kg Wean PEEP and FiO2 as able Goal plateau pressure less than 30, driving pressure less than 15 Paralytics if necessary for vent synchrony, gas exchange Cycle prone positioning if necessary for oxygenation Deep sedation per PAD protocol VAP prevention order set Remdesivir Therapy IV STEROIDS Therapy Follow inflammatory markers as needed with CRP Vitamin C, zinc Plan to repeat and check resp cultures as needed   Severe ACUTE Hypoxic and Hypercapnic Respiratory Failure -continue Full MV support -continue Bronchodilator Therapy -Wean Fio2 and PEEP as tolerated -VAP/VENT bundle implementation  ACUTE DIASTOLIC CARDIAC FAILURE-  -oxygen as needed -Lasix as tolerated   Morbid obesity, possible OSA.   Will certainly impact respiratory mechanics, ventilator weaning Suspect will need to consider additional PEEP   ACUTE KIDNEY  INJURY/Renal Failure -continue Foley Catheter-assess need -Avoid nephrotoxic agents -Follow urine output, BMP -Ensure adequate renal perfusion, optimize oxygenation -Renal dose medications     NEUROLOGY Acute toxic metabolic encephalopathy, need for sedation Goal RASS -2 to -3  SHOCK-SEPSIS -use vasopressors to keep MAP>65 as needed -follow ABG and LA -follow up cultures -emperic ABX  CARDIAC ICU monitoring  ID -continue IV abx as prescibed -follow up cultures  GI GI PROPHYLAXIS as indicated   DIET-->TF's as tolerated Constipation protocol as indicated  ENDO - will use ICU hypoglycemic\Hyperglycemia protocol if indicated    ELECTROLYTES -follow labs as needed -replace as needed -pharmacy consultation and following   DVT/GI PRX ordered and  assessed TRANSFUSIONS AS NEEDED MONITOR FSBS I Assessed the need for Labs I Assessed the need for Foley I Assessed the need for Central Venous Line Family Discussion when available I Assessed the need for Mobilization I made an Assessment of medications to be adjusted accordingly Safety Risk assessment completed   CASE DISCUSSED IN MULTIDISCIPLINARY ROUNDS WITH ICU TEAM  Critical Care Time devoted to patient care services described in this note is 49 minutes.   Overall, patient is critically ill, prognosis is guarded.  Patient with Multiorgan failure and at high risk for cardiac arrest and death.    Lucie Leather, M.D.  [facility]  Medical Director [facility] Medical Director [facility] Cardio-Pulmonary Department

## 2021-01-14 NOTE — Procedures (Signed)
Central Venous Catheter Insertion Procedure Note  Erik [name]  [id number]  [birth year]  Date:01/14/21  Time:2:03 PM   Provider Performing:Jivan Symanski D Elvina Sidle   Procedure: Insertion of Non-tunneled Central Venous Catheter(36556)with US guidance (13244)    Indication(s) Hemodialysis  Consent Unable to obtain consent due to emergent nature of procedure.  Anesthesia Topical only with 1% lidocaine   Timeout Verified patient identification, verified procedure, site/side was marked, verified correct patient position, special equipment/implants available, medications/allergies/relevant history reviewed, required imaging and test results available.  Sterile Technique Maximal sterile technique including full sterile barrier drape, hand hygiene, sterile gown, sterile gloves, mask, hair covering, sterile ultrasound probe cover (if used).  Procedure Description Area of catheter insertion was cleaned with chlorhexidine and draped in sterile fashion.   With real-time ultrasound guidance a HD catheter was placed into the right femoral vein.  Nonpulsatile blood flow and easy flushing noted in all ports.  The catheter was sutured in place and sterile dressing applied.  Complications/Tolerance None; patient tolerated the procedure well. Chest X-ray is ordered to verify placement for internal jugular or subclavian cannulation.  Chest x-ray is not ordered for femoral cannulation.  EBL Minimal  Specimen(s) None   Line secured at the 30 cm mark.  BIOPATCH applied to the insertion site.      Harlon Ditty, AGACNP-BC [facility] Pager: [phone]

## 2021-01-15 ENCOUNTER — Inpatient Hospital Stay: Payer: HRSA Program

## 2021-01-15 ENCOUNTER — Other Ambulatory Visit: Payer: Self-pay

## 2021-01-15 LAB — FIBRIN DERIVATIVES D-DIMER (ARMC ONLY): Fibrin derivatives D-dimer (ARMC): 3676.89 ng/mL (FEU) — ABNORMAL HIGH (ref 0.00–499.00)

## 2021-01-15 LAB — CBC WITH DIFFERENTIAL/PLATELET
Abs Immature Granulocytes: 0.62 10*3/uL — ABNORMAL HIGH (ref 0.00–0.07)
Basophils Absolute: 0 10*3/uL (ref 0.0–0.1)
Basophils Relative: 0 %
Eosinophils Absolute: 0 10*3/uL (ref 0.0–0.5)
Eosinophils Relative: 0 %
HCT: 39.3 % (ref 39.0–52.0)
Hemoglobin: 11.5 g/dL — ABNORMAL LOW (ref 13.0–17.0)
Immature Granulocytes: 4 %
Lymphocytes Relative: 7 %
Lymphs Abs: 1.1 10*3/uL (ref 0.7–4.0)
MCH: 25.7 pg — ABNORMAL LOW (ref 26.0–34.0)
MCHC: 29.3 g/dL — ABNORMAL LOW (ref 30.0–36.0)
MCV: 87.9 fL (ref 80.0–100.0)
Monocytes Absolute: 1.2 10*3/uL — ABNORMAL HIGH (ref 0.1–1.0)
Monocytes Relative: 8 %
Neutro Abs: 12.1 10*3/uL — ABNORMAL HIGH (ref 1.7–7.7)
Neutrophils Relative %: 81 %
Platelets: 205 10*3/uL (ref 150–400)
RBC: 4.47 MIL/uL (ref 4.22–5.81)
RDW: 14.3 % (ref 11.5–15.5)
WBC: 15 10*3/uL — ABNORMAL HIGH (ref 4.0–10.5)
nRBC: 0.1 % (ref 0.0–0.2)

## 2021-01-15 LAB — GLUCOSE, CAPILLARY
Glucose-Capillary: 128 mg/dL — ABNORMAL HIGH (ref 70–99)
Glucose-Capillary: 129 mg/dL — ABNORMAL HIGH (ref 70–99)
Glucose-Capillary: 138 mg/dL — ABNORMAL HIGH (ref 70–99)
Glucose-Capillary: 138 mg/dL — ABNORMAL HIGH (ref 70–99)
Glucose-Capillary: 139 mg/dL — ABNORMAL HIGH (ref 70–99)
Glucose-Capillary: 141 mg/dL — ABNORMAL HIGH (ref 70–99)
Glucose-Capillary: 144 mg/dL — ABNORMAL HIGH (ref 70–99)
Glucose-Capillary: 146 mg/dL — ABNORMAL HIGH (ref 70–99)
Glucose-Capillary: 156 mg/dL — ABNORMAL HIGH (ref 70–99)
Glucose-Capillary: 159 mg/dL — ABNORMAL HIGH (ref 70–99)
Glucose-Capillary: 161 mg/dL — ABNORMAL HIGH (ref 70–99)
Glucose-Capillary: 164 mg/dL — ABNORMAL HIGH (ref 70–99)
Glucose-Capillary: 164 mg/dL — ABNORMAL HIGH (ref 70–99)
Glucose-Capillary: 187 mg/dL — ABNORMAL HIGH (ref 70–99)
Glucose-Capillary: 195 mg/dL — ABNORMAL HIGH (ref 70–99)
Glucose-Capillary: 221 mg/dL — ABNORMAL HIGH (ref 70–99)
Glucose-Capillary: 247 mg/dL — ABNORMAL HIGH (ref 70–99)
Glucose-Capillary: 98 mg/dL (ref 70–99)

## 2021-01-15 LAB — COMPREHENSIVE METABOLIC PANEL
ALT: 17 U/L (ref 0–44)
AST: 30 U/L (ref 15–41)
Albumin: 1.8 g/dL — ABNORMAL LOW (ref 3.5–5.0)
Alkaline Phosphatase: 66 U/L (ref 38–126)
Anion gap: 8 (ref 5–15)
BUN: 80 mg/dL — ABNORMAL HIGH (ref 6–20)
CO2: 23 mmol/L (ref 22–32)
Calcium: 7.5 mg/dL — ABNORMAL LOW (ref 8.9–10.3)
Chloride: 113 mmol/L — ABNORMAL HIGH (ref 98–111)
Creatinine, Ser: 6.56 mg/dL — ABNORMAL HIGH (ref 0.61–1.24)
GFR, Estimated: 11 mL/min — ABNORMAL LOW (ref >60)
Glucose, Bld: 213 mg/dL — ABNORMAL HIGH (ref 70–99)
Potassium: 4.5 mmol/L (ref 3.5–5.1)
Sodium: 144 mmol/L (ref 135–145)
Total Bilirubin: 0.5 mg/dL (ref 0.3–1.2)
Total Protein: 5.8 g/dL — ABNORMAL LOW (ref 6.5–8.1)

## 2021-01-15 LAB — RENAL FUNCTION PANEL
Albumin: 1.7 g/dL — ABNORMAL LOW (ref 3.5–5.0)
Anion gap: 12 (ref 5–15)
BUN: 81 mg/dL — ABNORMAL HIGH (ref 6–20)
CO2: 20 mmol/L — ABNORMAL LOW (ref 22–32)
Calcium: 7.4 mg/dL — ABNORMAL LOW (ref 8.9–10.3)
Chloride: 111 mmol/L (ref 98–111)
Creatinine, Ser: 6.52 mg/dL — ABNORMAL HIGH (ref 0.61–1.24)
GFR, Estimated: 11 mL/min — ABNORMAL LOW (ref >60)
Glucose, Bld: 217 mg/dL — ABNORMAL HIGH (ref 70–99)
Phosphorus: 7.4 mg/dL — ABNORMAL HIGH (ref 2.5–4.6)
Potassium: 4.3 mmol/L (ref 3.5–5.1)
Sodium: 143 mmol/L (ref 135–145)

## 2021-01-15 LAB — C-REACTIVE PROTEIN: CRP: 15.2 mg/dL — ABNORMAL HIGH (ref <1.0)

## 2021-01-15 LAB — PHOSPHORUS: Phosphorus: 7.8 mg/dL — ABNORMAL HIGH (ref 2.5–4.6)

## 2021-01-15 LAB — TRIGLYCERIDES: Triglycerides: 390 mg/dL — ABNORMAL HIGH (ref <150)

## 2021-01-15 LAB — MAGNESIUM: Magnesium: 2.8 mg/dL — ABNORMAL HIGH (ref 1.7–2.4)

## 2021-01-15 LAB — TROPONIN I (HIGH SENSITIVITY)
Troponin I (High Sensitivity): 50 ng/L — ABNORMAL HIGH (ref <18)
Troponin I (High Sensitivity): 42 ng/L — ABNORMAL HIGH (ref <18)

## 2021-01-15 MED ORDER — MIDAZOLAM HCL 2 MG/2ML IJ SOLN
2.0000 mg | Freq: Once | INTRAMUSCULAR | Status: AC
  Administered 2021-01-15: 2 mg via INTRAVENOUS

## 2021-01-15 MED ORDER — DEXTROSE 10 % IV SOLN: INTRAVENOUS | Status: DC

## 2021-01-15 MED ORDER — INSULIN DETEMIR 100 UNIT/ML ~~LOC~~ SOLN
35.0000 [IU] | Freq: Every day | SUBCUTANEOUS | Status: DC
  Administered 2021-01-15 – 2021-01-17 (×3): 35 [IU] via SUBCUTANEOUS
  Filled 2021-01-15 (×5): qty 0.35

## 2021-01-15 MED ORDER — HEPARIN (PORCINE) IN NACL 2-0.9 UNITS/ML
Freq: Once | INTRAMUSCULAR | Status: AC
  Filled 2021-01-15 (×4): qty 1000

## 2021-01-15 MED ORDER — HEPARIN (PORCINE) 2000 UNITS/L FOR CRRT
INTRAVENOUS_CENTRAL | Status: DC | PRN
  Administered 2021-01-15: 2000 mL via INTRAVENOUS_CENTRAL
  Filled 2021-01-15 (×6): qty 1000

## 2021-01-15 MED ORDER — INSULIN ASPART 100 UNIT/ML ~~LOC~~ SOLN
3.0000 [IU] | SUBCUTANEOUS | Status: DC
  Administered 2021-01-16 – 2021-01-18 (×14): 3 [IU] via SUBCUTANEOUS
  Filled 2021-01-15 (×14): qty 1

## 2021-01-15 MED ORDER — DEXTROSE IN LACTATED RINGERS 5 % IV SOLN: INTRAVENOUS | Status: DC

## 2021-01-15 MED ORDER — INSULIN ASPART 100 UNIT/ML ~~LOC~~ SOLN
0.0000 [IU] | SUBCUTANEOUS | Status: DC
  Administered 2021-01-16: 3 [IU] via SUBCUTANEOUS
  Administered 2021-01-16 (×2): 5 [IU] via SUBCUTANEOUS
  Administered 2021-01-16: 3 [IU] via SUBCUTANEOUS
  Administered 2021-01-16 (×2): 5 [IU] via SUBCUTANEOUS
  Filled 2021-01-15 (×6): qty 1

## 2021-01-15 MED ORDER — ADULT MULTIVITAMIN LIQUID CH
15.0000 mL | Freq: Every day | ORAL | Status: DC
  Administered 2021-01-16 – 2021-01-19 (×4): 15 mL
  Filled 2021-01-15 (×4): qty 15

## 2021-01-15 MED ORDER — ALTEPLASE 2 MG IJ SOLR
2.0000 mg | Freq: Once | INTRAMUSCULAR | Status: AC
  Administered 2021-01-15: 2 mg

## 2021-01-15 MED ORDER — VITAL HIGH PROTEIN PO LIQD
1000.0000 mL | ORAL | Status: DC
  Administered 2021-01-15 – 2021-01-17 (×3): 1000 mL

## 2021-01-15 NOTE — Progress Notes (Signed)
This RN noted the philips monitor alarming for ST elevation. An EKG was performed. The results indicated potential lateral infarct. Annabelle Harman, NP was notified and a STAT troponin was drawn along with morning labs.   Lab was notified that they will need to come collect labs after CRRT is restarted because the electrolyte-containing prismasol replacement solution will be infusing. Will continue to monitor.  Carmel Sacramento, RN

## 2021-01-15 NOTE — Progress Notes (Signed)
PHARMACY CONSULT NOTE   Pharmacy Consult for Electrolyte Monitoring and Replacement   Recent Labs: Potassium (mmol/L)  Date Value  01/15/2021 4.5   Magnesium (mg/dL)  Date Value  50/27/7412 2.8 (H)   Calcium (mg/dL)  Date Value  87/86/7672 7.5 (L)   Albumin (g/dL)  Date Value  09/47/0962 1.8 (L)   Phosphorus (mg/dL)  Date Value  83/66/2947 7.8 (H)   Sodium (mmol/L)  Date Value  01/15/2021 144    Assessment: 30 year old male admitted with COVID-19 pneumonia and DKA. Started on an insulin drip. Patient with hypoxic respiratory failure requiring intubation 2/1. Patient hypotensive and requiring blood pressure support with Levophed. Patient remains intubated sedated and on mechanical ventilation in the ICU. Pharmacy to manage electrolytes.  Nutrition: Tube feeds + free water flushes 200 mL q2h (2.4 L/day)  MIVF: D5LR at 125 mL/hr  Patient with worsening AKI since admission despite fluid resuscitation. Nephrology following. Starting CRRT 2/3.  Goal of Therapy:  Electrolytes WNL  Plan:  Patient remains on CRRT and insulin drip. Plan to keep on insulin drip until rate more stable. Do not expect patient will need potassium supplementation. Labs currently ordered BID per CRRT. Do not see need for checking more frequently at this time. Continue to follow along.  Pricilla Riffle, PharmD 01/15/2021 2:22 PM

## 2021-01-15 NOTE — Progress Notes (Signed)
Pt moved to Sizewise bed approx 1300 this shift. CRRT ran fairly well until 12:30.  Filter pressures began to increase.  After pt was moved to new bed return pressures began to increase as well.  CRRT paused at 1330 for filter change.  Machine would not allow blood to be returned to the patient.  Return lumen did not draw back blood but flushed well.  Upon withdrawal of heparin at 1530 return line did draw back some blood, but almost immediately upon initiation of CRRT with new Oxiris filter the return pressure alarm began sounding.  Return line flushed repeatedly but the filter would not run.  Machine would not allow blood to be returned to the patient d/t high return pressure.  Both lumens packed with alteplase at 1530.  At 1755 alteplase withdrawn and excellent blood return was observed from both lumens.  CRRT resumed at 1755. Pt's mother attempted an [place] chat with him but called the desk later and stated she could not get her microphone or camera to work.  I did update her via phone earlier in the shift

## 2021-01-15 NOTE — Procedures (Signed)
Central Venous Catheter Insertion Procedure Note  EATON FOLMAR  [id number]  Sep 27, 1991  Date:01/15/21  Time:6:14 AM   Provider Performing:Jaishawn Witzke [facility]   Procedure: Insertion of Non-tunneled Central Venous Catheter(36556)with US guidance (74259)    Indication(s) Medication administration and Hemodialysis  Consent Risks of the procedure as well as the alternatives and risks of each were explained to the patient and/or caregiver.  Consent for the procedure was obtained and is signed in the bedside chart  Anesthesia Topical only with 1% lidocaine   Timeout Verified patient identification, verified procedure, site/side was marked, verified correct patient position, special equipment/implants available, medications/allergies/relevant history reviewed, required imaging and test results available.  Sterile Technique Maximal sterile technique including full sterile barrier drape, hand hygiene, sterile gown, sterile gloves, mask, hair covering, sterile ultrasound probe cover (if used).  Procedure Description Area of catheter insertion was cleaned with chlorhexidine and draped in sterile fashion.   With real-time ultrasound guidance a HD catheter was placed into the left internal jugular vein.  Nonpulsatile blood flow and easy flushing noted in all ports.  The catheter was sutured in place and sterile dressing applied.  Complications/Tolerance None; patient tolerated the procedure well. Chest X-ray is ordered to verify placement for internal jugular or subclavian cannulation.  Chest x-ray is not ordered for femoral cannulation.  EBL Minimal  Specimen(s) None  Sonda Rumble, AGNP  Pulmonary/Critical Care Pager [phone] (please enter 7 digits) PCCM Consult Pager [phone] (please enter 7 digits)

## 2021-01-15 NOTE — Progress Notes (Signed)
Initial Nutrition Assessment  DOCUMENTATION CODES:   Morbid obesity  INTERVENTION:   Change to Vital HP @40ml /hr + ProSource TF 59ml QID via tube   Propofol: 42.9 ml/hr- provides 1134kcal/day   Free water flushes 23ml q4 hours to maintain tube patency   Regimen provides 2414kcal/day, 172g/day protein and 914ml/day free water   Liquid MVI daily via tube   NUTRITION DIAGNOSIS:   Inadequate oral intake related to inability to eat as evidenced by NPO status.  GOAL:   Patient will meet greater than or equal to 90% of their needs  MONITOR:   Vent status,Labs,Weight trends,TF tolerance,I & O's  REASON FOR ASSESSMENT:   Ventilator,Consult Assessment of nutrition requirement/status,Enteral/tube feeding initiation and management  ASSESSMENT:   30 year old male with PMHx of asthma admitted with COVID-19 PNA and AKI.   2/1 intubated  Pt remains sedated and ventilated. Pt tolerating tube feeds at goal rate. RD will adjust tube feeds in setting of new propofol.   Per chart, pt up ~30lbs since admit if weight are correct.   Medications reviewed and include: colace, heparin, solu-medrol, miralax, azithromycin, ceftriaxone, pepcid, fentanyl, insulin, levophed, propofol  Labs reviewed: K 4.5 wnl, BUN 80(H), creat 6.56(H), P 7.8(H), Mg 2.8(H) Wbc- 15.0(H)  Patient is currently intubated on ventilator support MV: 14.33 L/min Temp (24hrs), Avg:100.2 F (37.9 C), Min:99.32 F (37.4 C), Max:101.12 F (38.4 C)  Propofol: 42.9 ml/hr- provides 1134kcal/day   MAP- >18mmHg  UOP- 72m  Diet Order:   Diet Order            Diet NPO time specified  Diet effective now                EDUCATION NEEDS:   No education needs have been identified at this time  Skin:  Skin Assessment: Reviewed RN Assessment  Last BM:  2/4- type 7  Height:   Ht Readings from Last 1 Encounters:  01/12/21 6' (1.829 m)   Weight:   Wt Readings from Last 1 Encounters:  01/15/21 (!) 174.2 kg    Ideal Body Weight:  80.9 kg  BMI:  Body mass index is 52.09 kg/m.  Estimated Nutritional Needs:   Kcal:  2250  Protein:  160-170 grams  Fluid:  >/= 2 L/day  2251 MS, RD, LDN Please refer to [facility] for RD and/or RD on-call/weekend/after hours pager

## 2021-01-15 NOTE — Progress Notes (Signed)
CRRT restarted with no complications.  °

## 2021-01-15 NOTE — Progress Notes (Signed)
CRITICAL CARE NOTE 30 yo morbidly obese male with ASTHMA now with progressive multiorgan failure with COVID 19 pneumonia and ARDS with severe acidosis and severe hypoxia with ARDS with acute renal failure  2/1 Emergently intubated 2/2 severe resp failure, vasc cath placed 2/3 severe resp failure 01/15/2021- patient is critically Ill, CRRT continued.  Borderline low blood glucose today.    CC    SUBJECTIVE Patient remains critically ill Prognosis is guarded  Vent Mode: PRVC FiO2 (%):  [24 %-35 %] 24 % Set Rate:  [25 bmp] 25 bmp Vt Set:  [550 mL] 550 mL PEEP:  [5 cmH20] 5 cmH20 Plateau Pressure:  [22 cmH20] 22 cmH20 CBC    Component Value Date/Time   WBC 15.0 (H) 01/15/2021 0402   RBC 4.47 01/15/2021 0402   HGB 11.5 (L) 01/15/2021 0402   HCT 39.3 01/15/2021 0402   PLT 205 01/15/2021 0402   MCV 87.9 01/15/2021 0402   MCH 25.7 (L) 01/15/2021 0402   MCHC 29.3 (L) 01/15/2021 0402   RDW 14.3 01/15/2021 0402   LYMPHSABS 1.1 01/15/2021 0402   MONOABS 1.2 (H) 01/15/2021 0402   EOSABS 0.0 01/15/2021 0402   BASOSABS 0.0 01/15/2021 0402   BMP Latest Ref Rng & Units 01/15/2021 01/14/2021 01/14/2021  Glucose 70 - 99 mg/dL 412(I) 786(V) 672(C)  BUN 6 - 20 mg/dL 94(B) 09(G) 28(Z)  Creatinine 0.61 - 1.24 mg/dL 6.62(H) 4.76(L) 4.65(K)  Sodium 135 - 145 mmol/L 144 148(H) 153(H)  Potassium 3.5 - 5.1 mmol/L 4.5 5.1 4.5  Chloride 98 - 111 mmol/L 113(H) 115(H) 116(H)  CO2 22 - 32 mmol/L 23 19(L) 23  Calcium 8.9 - 10.3 mg/dL 7.5(L) 7.6(L) 7.9(L)    BP (!) 89/44   Pulse 86   Temp (!) 100.58 F (38.1 C)   Resp (!) 25   Ht 6' (1.829 m)   Wt (!) 174.2 kg   SpO2 98%   BMI 52.09 kg/m    I/O last 3 completed shifts: In: 11928.3 [I.V.:5831; NG/GT:5534.1; IV Piggyback:563.2] Out: 864 [Urine:190; Other:674] Total I/O In: 773.1 [I.V.:373.1; NG/GT:350; IV Piggyback:50] Out: 254 [Urine:41; Other:213]  SpO2: 98 % O2 Flow Rate (L/min): 7 L/min FiO2 (%): 24 %  Estimated body mass index is  52.09 kg/m as calculated from the following:   Height as of this encounter: 6' (1.829 m).   Weight as of this encounter: 174.2 kg.  SIGNIFICANT EVENTS   REVIEW OF SYSTEMS  PATIENT IS UNABLE TO PROVIDE COMPLETE REVIEW OF SYSTEMS DUE TO SEVERE CRITICAL ILLNESS       PHYSICAL EXAMINATION: GENERAL:obee age appropriate in NAD sedated on MV NECK: Supple.  PULMONARY: +rhonchi with mV sounds CARDIOVASCULAR: S1 and S2.  GASTROINTESTINAL: Soft, nontender, +Positive bowel sounds.  MUSCULOSKELETAL: No swelling, clubbing, or edema. HD-access  NEUROLOGIC:  GCS4t on MV SKIN:grossly warm, edematous     MEDICATIONS: I have reviewed all medications and confirmed regimen as documented   CULTURE RESULTS   Recent Results (from the past 240 hour(s))  Blood culture (routine single)     Status: None (Preliminary result)   Collection Time: 01/12/21  9:05 AM   Specimen: BLOOD  Result Value Ref Range Status   Specimen Description BLOOD LEFT AC  Final   Special Requests   Final    BOTTLES DRAWN AEROBIC ONLY Blood Culture results may not be optimal due to an inadequate volume of blood received in culture bottles   Culture   Final    NO GROWTH 3 DAYS Performed at [facility], [address] [place], [place] [address]    Report Status PENDING  Incomplete  SARS Coronavirus 2 by RT PCR (hospital order, performed in [facility] hospital lab) Nasopharyngeal Nasopharyngeal Swab     Status: Abnormal   Collection Time: 01/12/21  9:36 AM   Specimen: Nasopharyngeal Swab  Result Value Ref Range Status   SARS Coronavirus 2 POSITIVE (A) NEGATIVE Final    Comment: RESULT CALLED TO, READ BACK BY AND VERIFIED WITH: ANNA JASPER ON 01/12/21 AT 1111 SDR (NOTE) SARS-CoV-2 target nucleic acids are DETECTED  SARS-CoV-2 RNA is generally detectable in upper respiratory specimens  during the acute phase of infection.  Positive results are indicative  of the presence of the identified virus, but do not  rule out bacterial infection or co-infection with other pathogens not detected by the test.  Clinical correlation with patient history and  other diagnostic information is necessary to determine patient infection status.  The expected result is negative.  Fact Sheet for Patients:   BoilerBrush.com.cy   Fact Sheet for Healthcare Providers:   https://pope.com/    This test is not yet approved or cleared by the [place] FDA and  has been authorized for detection and/or diagnosis of SARS-CoV-2 by FDA under an Emergency Use Authorization (EUA).  This EUA will remain in effect (meaning this tes t can be used) for the duration of  the COVID-19 declaration under Section 564(b)(1) of the Act, 21 U.S.C. section 360-bbb-3(b)(1), unless the authorization is terminated or revoked sooner.  Performed at [facility], [address] [place], [place] [address]   Culture, blood (single)     Status: None (Preliminary result)   Collection Time: 01/12/21  1:08 PM   Specimen: BLOOD  Result Value Ref Range Status   Specimen Description BLOOD BLOOD RIGHT HAND  Final   Special Requests   Final    BOTTLES DRAWN AEROBIC AND ANAEROBIC Blood Culture adequate volume   Culture   Final    NO GROWTH 3 DAYS Performed at [facility], [address] [place], [place] [address]    Report Status PENDING  Incomplete  MRSA PCR Screening     Status: None   Collection Time: 01/12/21  7:22 PM   Specimen: Nasopharyngeal  Result Value Ref Range Status   MRSA by PCR NEGATIVE NEGATIVE Final    Comment:        The GeneXpert MRSA Assay (FDA approved for NASAL specimens only), is one component of a comprehensive MRSA colonization surveillance program. It is not intended to diagnose MRSA infection nor to guide or monitor treatment for MRSA infections. Performed at [facility], [address] [place], [place] [address]   Culture,  respiratory (non-expectorated)     Status: None (Preliminary result)   Collection Time: 01/13/21  3:04 PM   Specimen: Tracheal Aspirate; Respiratory  Result Value Ref Range Status   Specimen Description   Final    TRACHEAL ASPIRATE Performed at [facility], [address] [place], [place] [address]    Special Requests   Final    NONE Performed at [facility], [address] [place], [place] [address]    Gram Stain   Final    ABUNDANT WBC PRESENT, PREDOMINANTLY PMN MODERATE GRAM POSITIVE COCCI    Culture   Final    FEW STAPHYLOCOCCUS AUREUS SUSCEPTIBILITIES TO FOLLOW Performed at [facility], [address] [place], [place] [address]    Report Status PENDING  Incomplete          IMAGING    DG Chest Port 1 View  Result Date: 01/15/2021 CLINICAL DATA:  Central line placement EXAM: PORTABLE CHEST 1 VIEW COMPARISON:  Radiograph 01/14/2021 FINDINGS: Endotracheal tube tip terminates 4.1 cm from the carina. Transesophageal tube tip and side port terminate below the GE junction, beyond the margins of imaging. Dual lumen left IJ approach central venous catheter tip terminates within the right atrium. Telemetry leads and external devices overlie the chest. Patchy heterogeneous opacities are present in both lungs, with a mid to lower lung predominance. No pneumothorax. No effusion. Stable cardiomediastinal contours. No acute osseous or soft tissue abnormality. IMPRESSION: 1. Endotracheal tube tip terminates 4.1 cm from the carina. 2. Transesophageal tube tip and side port terminate below the GE junction, beyond the margins of imaging. 3. Persistent heterogeneous bilateral opacities, compatible with COVID-19 pneumonia. Electronically Signed   By: Kreg Shropshire M.D.   On: 01/15/2021 06:37   DG Chest Port 1 View  Result Date: 01/14/2021 CLINICAL DATA:  Respiratory failure.  COVID pneumonia. EXAM: PORTABLE CHEST 1 VIEW COMPARISON:  Radiograph yesterday. FINDINGS:  Endotracheal tube tip 3.9 cm from the carina. Enteric tube in place with tip below the diaphragm not included in the field of view. Previous right internal jugular central line is not definitively seen. Improved retrocardiac opacity from prior exam. Background patchy heterogeneous opacities persist, not significantly changed. Stable heart size and mediastinal contours. No pneumothorax or large pleural effusion. IMPRESSION: 1. Improved retrocardiac opacity. Additional heterogeneous bilateral lung opacities are not significantly changed, typical of COVID pneumonia. 2. Endotracheal tube tip 3.9 cm from the carina. Enteric tube in place. Electronically Signed   By: Narda Rutherford M.D.   On: 01/14/2021 17:40     Nutrition Status: Nutrition Problem: Inadequate oral intake Etiology: inability to eat Signs/Symptoms: NPO status Interventions: Tube feeding,Prostat     Indwelling Urinary Catheter continued, requirement due to   Reason to continue Indwelling Urinary Catheter strict Intake/Output monitoring for hemodynamic instability   Central Line/ continued, requirement due to  Reason to continue [employer] Monitoring of central venous pressure or other hemodynamic parameters and poor IV access   Ventilator continued, requirement due to severe respiratory failure   Ventilator Sedation RASS 0 to -2      ASSESSMENT AND PLAN SYNOPSIS 30 yo morbidly obese male with ASTHMA now with progressive multiorgan failure with COVID 19 pneumonia and ARDS with severe acidosis and severe hypoxia with ARDS with acute renal failure    Acute hypoxemic respiratory failure due to COVID-19 pneumonia/ARDS Mechanical ventilation via ARDS protocol, target PRVC 6 cc/kg Wean PEEP and FiO2 as able Goal plateau pressure less than 30, driving pressure less than 15 Paralytics if necessary for vent synchrony, gas exchange -difficulty with prone positioning due to CRRT , and morbid body habitus Deep sedation per PAD  protocol VAP prevention order set Remdesivir Therapy IV STEROIDS Therapy Follow inflammatory markers as needed with CRP Vitamin C, zinc Plan to repeat and check resp cultures as needed   Severe ACUTE Hypoxic and Hypercapnic Respiratory Failure -continue Full MV support -continue Bronchodilator Therapy -Wean Fio2 and PEEP as tolerated -VAP/VENT bundle implementation  ACUTE DIASTOLIC CARDIAC FAILURE-  -oxygen as needed -Lasix as tolerated   Morbid obesity, possible OSA.   Will certainly impact respiratory mechanics, ventilator weaning Suspect will need to consider additional PEEP   ACUTE KIDNEY INJURY/Renal Failure -continue Foley Catheter-assess need -Avoid nephrotoxic agents -Follow urine output, BMP -Ensure adequate  renal perfusion, optimize oxygenation -Renal dose medications     NEUROLOGY Acute toxic metabolic encephalopathy, need for sedation Goal RASS -2 to -3  SHOCK-SEPSIS -use vasopressors to keep MAP>65 as needed -follow ABG and LA -follow up cultures -emperic ABX  CARDIAC ICU monitoring  ID -continue IV abx as prescibed -follow up cultures  GI GI PROPHYLAXIS as indicated   DIET-->TF's as tolerated Constipation protocol as indicated  ENDO - will use ICU hypoglycemic\Hyperglycemia protocol if indicated    ELECTROLYTES -follow labs as needed -replace as needed -pharmacy consultation and following   DVT/GI PRX ordered and assessed TRANSFUSIONS AS NEEDED MONITOR FSBS I Assessed the need for Labs I Assessed the need for Foley I Assessed the need for Central Venous Line Family Discussion when available I Assessed the need for Mobilization I made an Assessment of medications to be adjusted accordingly Safety Risk assessment completed   CASE DISCUSSED IN MULTIDISCIPLINARY ROUNDS WITH ICU TEAM  Critical Care Time devoted to patient care services described in this note is 33 minutes.   Overall, patient is critically ill, prognosis is  guarded.  Patient with Multiorgan failure and at high risk for cardiac arrest and death.     Vida Rigger, M.D.  Pulmonary & Critical Care Medicine  [facility]

## 2021-01-15 NOTE — Progress Notes (Signed)
[place], [place] 01/15/21  Subjective:   Hospital day # 3 Neuro: sedated with fentanyl, propofol  cvs: requiring pressors (levophed) Pulm: vent assisted; fio2 25% (improving) Gi: TF @ 50; insulin drip is being weaned off maintenance fluid LR '@500'  c/hr Renal:  02/03 0701 - 02/04 0700 In: 6306.9 [I.V.:2688.9; NG/GT:3054.8; IV Piggyback:563.2] Out: 834 [Urine:160] Lab Results  Component Value Date   CREATININE 6.56 (H) 01/15/2021   CREATININE 6.40 (H) 01/14/2021   CREATININE 6.48 (H) 01/14/2021      Objective:  Vital signs in last 24 hours:  Temp:  [99.32 F (37.4 C)-101.3 F (38.5 C)] 100.58 F (38.1 C) (02/04 0900) Pulse Rate:  [77-115] 86 (02/04 0900) Resp:  [13-25] 25 (02/04 0900) BP: (89-152)/(44-120) 89/44 (02/04 0900) SpO2:  [96 %-100 %] 98 % (02/04 0900) FiO2 (%):  [24 %-35 %] 24 % (02/04 0826) Weight:  [174.2 kg] 174.2 kg (02/04 0350)  Weight change:  Filed Weights   01/12/21 1205 01/13/21 0402 01/15/21 0350  Weight: (!) 159.6 kg (!) 164.9 kg (!) 174.2 kg    Intake/Output:    Intake/Output Summary (Last 24 hours) at 01/15/2021 0937 Last data filed at 01/15/2021 0400 Gross per 24 hour  Intake 5604.98 ml  Output 829 ml  Net 4775.98 ml    Physical Exam: General:  ill appearing  HEENT ETT in place,  Lungs: Vent assited, coarse  Heart:: Tachycardic, regular   Abdomen: soft  Extremities:  no edema  Neurologic: sedated  Skin: warm  Access: left IJ temp cath  Foley: present      Basic Metabolic Panel:  Recent Labs  Lab 01/13/21 0319 01/13/21 0851 01/13/21 2016 01/14/21 0230 01/14/21 0931 01/14/21 1307 01/15/21 0402  NA 158*   < > 153* 155* 153* 148* 144  K 4.7   < > 5.7* 4.8 4.5 5.1 4.5  CL 117*   < > 119* 119* 116* 115* 113*  CO2 25   < > 20* 23 23 19* 23  GLUCOSE 253*   < > 254* 134* 181* 200* 213*  BUN 73*   < > 75* 75* 77* 78* 80*  CREATININE 3.66*   < > 5.41* 5.86* 6.48* 6.40* 6.56*  CALCIUM 8.3*   <  > 7.9* 8.1* 7.9* 7.6* 7.5*  MG 3.9*  --   --  3.5*  --   --  2.8*  PHOS 5.6*  --   --  5.7*  --  6.3* 7.8*   < > = values in this interval not displayed.     CBC: Recent Labs  Lab 01/12/21 0839 01/12/21 1308 01/13/21 0319 01/14/21 0230 01/15/21 0402  WBC 17.3* 17.9* 15.0* 16.0* 15.0*  NEUTROABS 14.1* 14.9* 10.7* 12.8* 12.1*  HGB 15.8 15.3 15.3 12.5* 11.5*  HCT 53.9* 53.5* 51.1 43.9 39.3  MCV 87.1 88.9 86.8 89.8 87.9  PLT 447* 441* 403* 274 205     No results found for: HEPBSAG, HEPBSAB, HEPBIGM    Microbiology:  Recent Results (from the past 240 hour(s))  Blood culture (routine single)     Status: None (Preliminary result)   Collection Time: 01/12/21  9:05 AM   Specimen: BLOOD  Result Value Ref Range Status   Specimen Description BLOOD LEFT AC  Final   Special Requests   Final    BOTTLES DRAWN AEROBIC ONLY Blood Culture results may not be optimal due to an inadequate volume of blood received in culture bottles   Culture   Final  NO GROWTH 3 DAYS Performed at [facility], [address] [place], [place] [address]    Report Status PENDING  Incomplete  SARS Coronavirus 2 by RT PCR (hospital order, performed in [facility] hospital lab) Nasopharyngeal Nasopharyngeal Swab     Status: Abnormal   Collection Time: 01/12/21  9:36 AM   Specimen: Nasopharyngeal Swab  Result Value Ref Range Status   SARS Coronavirus 2 POSITIVE (A) NEGATIVE Final    Comment: RESULT CALLED TO, READ BACK BY AND VERIFIED WITH: ANNA JASPER ON 01/12/21 AT 54 SDR (NOTE) SARS-CoV-2 target nucleic acids are DETECTED  SARS-CoV-2 RNA is generally detectable in upper respiratory specimens  during the acute phase of infection.  Positive results are indicative  of the presence of the identified virus, but do not rule out bacterial infection or co-infection with other pathogens not detected by the test.  Clinical correlation with patient history and  other diagnostic information is  necessary to determine patient infection status.  The expected result is negative.  Fact Sheet for Patients:   StrictlyIdeas.no   Fact Sheet for Healthcare Providers:   BankingDealers.co.za    This test is not yet approved or cleared by the [place] FDA and  has been authorized for detection and/or diagnosis of SARS-CoV-2 by FDA under an Emergency Use Authorization (EUA).  This EUA will remain in effect (meaning this tes t can be used) for the duration of  the COVID-19 declaration under Section 564(b)(1) of the Act, 21 U.S.C. section 360-bbb-3(b)(1), unless the authorization is terminated or revoked sooner.  Performed at [facility], [address] [place], [place] [address]   Culture, blood (single)     Status: None (Preliminary result)   Collection Time: 01/12/21  1:08 PM   Specimen: BLOOD  Result Value Ref Range Status   Specimen Description BLOOD BLOOD RIGHT HAND  Final   Special Requests   Final    BOTTLES DRAWN AEROBIC AND ANAEROBIC Blood Culture adequate volume   Culture   Final    NO GROWTH 3 DAYS Performed at [facility], [address], [place], [place] [address]    Report Status PENDING  Incomplete  MRSA PCR Screening     Status: None   Collection Time: 01/12/21  7:22 PM   Specimen: Nasopharyngeal  Result Value Ref Range Status   MRSA by PCR NEGATIVE NEGATIVE Final    Comment:        The GeneXpert MRSA Assay (FDA approved for NASAL specimens only), is one component of a comprehensive MRSA colonization surveillance program. It is not intended to diagnose MRSA infection nor to guide or monitor treatment for MRSA infections. Performed at [facility], [address] [place], [place] [address]   Culture, respiratory (non-expectorated)     Status: None (Preliminary result)   Collection Time: 01/13/21  3:04 PM   Specimen: Tracheal Aspirate; Respiratory  Result Value Ref Range  Status   Specimen Description   Final    TRACHEAL ASPIRATE Performed at [facility], [address] [place], [place] [address]    Special Requests   Final    NONE Performed at [facility], [address] [place], [place] [address]    Gram Stain   Final    ABUNDANT WBC PRESENT, PREDOMINANTLY PMN MODERATE GRAM POSITIVE COCCI    Culture   Final    TOO YOUNG TO READ Performed at [facility], [place] [address] [place],  [address]  Report Status PENDING  Incomplete    Coagulation Studies: Recent Labs    01/12/21 1009  LABPROT 14.3  INR 1.2    Urinalysis: Recent Labs    01/13/21 1046  COLORURINE AMBER*  LABSPEC 1.027  PHURINE 5.0  GLUCOSEU 50*  HGBUR SMALL*  BILIRUBINUR NEGATIVE  KETONESUR 5*  PROTEINUR 30*  NITRITE NEGATIVE  LEUKOCYTESUR TRACE*      Imaging: DG Abd 1 View  Result Date: 01/13/2021 CLINICAL DATA:  Orogastric tube placement EXAM: ABDOMEN - 1 VIEW COMPARISON:  01/12/2021 FINDINGS: Limited view of the lower chest and upper abdomen was performed for the purposes of enteric tube localization. Enteric tube courses below the diaphragm with distal tip extending into the distal aspect of the gastric body. Multiple overlying cardiac leads. Cardiomegaly. IMPRESSION: Enteric tube courses below the diaphragm with distal tip extending into the distal aspect of the gastric body. Electronically Signed   By: Davina Poke D.O.   On: 01/13/2021 11:31   US RENAL  Result Date: 01/13/2021 CLINICAL DATA:  Acute kidney injury. EXAM: RENAL / URINARY TRACT ULTRASOUND COMPLETE COMPARISON:  None. FINDINGS: Right Kidney: Renal measurements: 13.7 x 6.6 x 7.6 cm = volume: 367 mL. No hydronephrosis. Grossly normal renal parenchymal echogenicity. Normal renal cortical thickness. No obvious focal renal lesion. Left Kidney: Renal measurements: 12.6 x 7.0 x 6.3 cm = volume: 290 mL. No hydronephrosis. Grossly normal renal parenchymal echogenicity.  Normal renal cortical thickness. No obvious focal lesion. Bladder: Decompressed by Foley catheter. Other: Incidental note of hepatic steatosis. Technically limited exam due to patient body habitus and medical status on ventilator. IMPRESSION: Unremarkable renal ultrasound.  No obstructive uropathy. Electronically Signed   By: Keith Rake M.D.   On: 01/13/2021 19:30   DG Chest Port 1 View  Result Date: 01/15/2021 CLINICAL DATA:  Central line placement EXAM: PORTABLE CHEST 1 VIEW COMPARISON:  Radiograph 01/14/2021 FINDINGS: Endotracheal tube tip terminates 4.1 cm from the carina. Transesophageal tube tip and side port terminate below the GE junction, beyond the margins of imaging. Dual lumen left IJ approach central venous catheter tip terminates within the right atrium. Telemetry leads and external devices overlie the chest. Patchy heterogeneous opacities are present in both lungs, with a mid to lower lung predominance. No pneumothorax. No effusion. Stable cardiomediastinal contours. No acute osseous or soft tissue abnormality. IMPRESSION: 1. Endotracheal tube tip terminates 4.1 cm from the carina. 2. Transesophageal tube tip and side port terminate below the GE junction, beyond the margins of imaging. 3. Persistent heterogeneous bilateral opacities, compatible with COVID-19 pneumonia. Electronically Signed   By: Lovena Le M.D.   On: 01/15/2021 06:37   DG Chest Port 1 View  Result Date: 01/14/2021 CLINICAL DATA:  Respiratory failure.  COVID pneumonia. EXAM: PORTABLE CHEST 1 VIEW COMPARISON:  Radiograph yesterday. FINDINGS: Endotracheal tube tip 3.9 cm from the carina. Enteric tube in place with tip below the diaphragm not included in the field of view. Previous right internal jugular central line is not definitively seen. Improved retrocardiac opacity from prior exam. Background patchy heterogeneous opacities persist, not significantly changed. Stable heart size and mediastinal contours. No pneumothorax  or large pleural effusion. IMPRESSION: 1. Improved retrocardiac opacity. Additional heterogeneous bilateral lung opacities are not significantly changed, typical of COVID pneumonia. 2. Endotracheal tube tip 3.9 cm from the carina. Enteric tube in place. Electronically Signed   By: Keith Rake M.D.   On: 01/14/2021 17:40     Medications:   . sodium chloride    .  azithromycin Stopped (01/14/21 1304)  . cefTRIAXone (ROCEPHIN)  IV Stopped (01/14/21 1159)  . dextrose 5% lactated ringers 50 mL/hr at 01/15/21 0200  . famotidine (PEPCID) IV 20 mg (01/15/21 0851)  . feeding supplement (VITAL 1.5 CAL) 50 mL/hr at 01/15/21 0422  . fentaNYL infusion INTRAVENOUS 200 mcg/hr (01/15/21 0432)  . insulin 15 Units/hr (01/15/21 0559)  . norepinephrine (LEVOPHED) Adult infusion 4 mcg/min (01/15/21 0200)  . prismasol BGK 2/2.5 dialysis solution 1,000 mL/hr at 01/14/21 2120  . prismasol BGK 2/2.5 replacement solution 200 mL/hr at 01/14/21 2120  . prismasol BGK 2/2.5 replacement solution 200 mL/hr at 01/14/21 2120  . propofol (DIPRIVAN) infusion 50 mcg/kg/min (01/15/21 0730)  . remdesivir 100 mg in NS 100 mL Stopped (01/14/21 0945)   . chlorhexidine gluconate (MEDLINE KIT)  15 mL Mouth Rinse BID  . Chlorhexidine Gluconate Cloth  6 each Topical Daily  . docusate  100 mg Per Tube BID  . feeding supplement (PROSource TF)  90 mL Per Tube QID  . free water  200 mL Per Tube Q2H  . heparin  5,000 Units Subcutaneous Q8H  . mouth rinse  15 mL Mouth Rinse 10 times per day  . methylPREDNISolone (SOLU-MEDROL) injection  40 mg Intravenous Q12H  . polyethylene glycol  17 g Per Tube Daily  . sodium chloride flush  3 mL Intravenous Q12H   sodium chloride, acetaminophen (TYLENOL) oral liquid 160 mg/5 mL, acetaminophen, dextrose, docusate sodium, fentaNYL, heparin, ibuprofen, midazolam, midazolam, ondansetron **OR** ondansetron (ZOFRAN) IV, ondansetron (ZOFRAN) IV, polyethylene glycol, sodium chloride, sodium chloride  flush  Assessment/ Plan:  30 y.o. male with h/o Asthma, no recent medical care , was  admitted on 01/12/2021 for ARDS (adult respiratory distress syndrome) ([facility]) [J80] Acute respiratory failure with hypoxia ([place]) [J96.01] Acute hypoxemic respiratory failure due to COVID-19 ([facility]) [U07.1, J96.01] Hyperosmolar hyperglycemic state (HHS) ([facility]) [E11.00, E11.65] Pneumonia due to COVID-19 virus [U07.1, J12.82]  # AKI Baseline creatinine is unknown U/a- 6-10 RBCs, 11-20 WBCs, UPC of 0.24 US renal - unremarkable, no obstruction UOP remains poor Getting CRRT  Dialyzer clotted last night - restarted using Oxiris dialyzer Plan to continue CRRT for metabolic optimization.  Bicarb level has improved.  BUN/creatinine, phosphorus levels remain high We will continue to monitor closely  # hypernatremia -  free water flushes volume can be decreased to 30 cc every 2 hours as usually required for tube feeds   # New Dx of Diabetes, type 1 uncontrolled Lab Results  Component Value Date   HGBA1C 13.6 (H) 01/12/2021   requiring insulin drip C-peptide and antiGAD abs results pending  # Acute respiratory failure # Covid 19-infection Currently Vent dependent Management as per ICU team     LOS: [place] 2/4/20229:37 AM  [facility] [place], [place]  Note: This note was prepared with Dragon dictation. Any transcription errors are unintentional

## 2021-01-16 LAB — CULTURE, RESPIRATORY W GRAM STAIN

## 2021-01-16 LAB — CBC WITH DIFFERENTIAL/PLATELET
Abs Immature Granulocytes: 0.86 10*3/uL — ABNORMAL HIGH (ref 0.00–0.07)
Basophils Absolute: 0 10*3/uL (ref 0.0–0.1)
Basophils Relative: 0 %
Eosinophils Absolute: 0 10*3/uL (ref 0.0–0.5)
Eosinophils Relative: 0 %
HCT: 35 % — ABNORMAL LOW (ref 39.0–52.0)
Hemoglobin: 10.6 g/dL — ABNORMAL LOW (ref 13.0–17.0)
Immature Granulocytes: 6 %
Lymphocytes Relative: 9 %
Lymphs Abs: 1.3 10*3/uL (ref 0.7–4.0)
MCH: 25.9 pg — ABNORMAL LOW (ref 26.0–34.0)
MCHC: 30.3 g/dL (ref 30.0–36.0)
MCV: 85.6 fL (ref 80.0–100.0)
Monocytes Absolute: 1.4 10*3/uL — ABNORMAL HIGH (ref 0.1–1.0)
Monocytes Relative: 10 %
Neutro Abs: 10.7 10*3/uL — ABNORMAL HIGH (ref 1.7–7.7)
Neutrophils Relative %: 75 %
Platelets: 143 10*3/uL — ABNORMAL LOW (ref 150–400)
RBC: 4.09 MIL/uL — ABNORMAL LOW (ref 4.22–5.81)
RDW: 14.6 % (ref 11.5–15.5)
WBC: 14.3 10*3/uL — ABNORMAL HIGH (ref 4.0–10.5)
nRBC: 0.2 % (ref 0.0–0.2)

## 2021-01-16 LAB — RENAL FUNCTION PANEL
Albumin: 1.7 g/dL — ABNORMAL LOW (ref 3.5–5.0)
Anion gap: 10 (ref 5–15)
BUN: 71 mg/dL — ABNORMAL HIGH (ref 6–20)
CO2: 21 mmol/L — ABNORMAL LOW (ref 22–32)
Calcium: 7.8 mg/dL — ABNORMAL LOW (ref 8.9–10.3)
Chloride: 105 mmol/L (ref 98–111)
Creatinine, Ser: 5.15 mg/dL — ABNORMAL HIGH (ref 0.61–1.24)
GFR, Estimated: 15 mL/min — ABNORMAL LOW (ref >60)
Glucose, Bld: 304 mg/dL — ABNORMAL HIGH (ref 70–99)
Phosphorus: 6.7 mg/dL — ABNORMAL HIGH (ref 2.5–4.6)
Potassium: 4.2 mmol/L (ref 3.5–5.1)
Sodium: 136 mmol/L (ref 135–145)

## 2021-01-16 LAB — COMPREHENSIVE METABOLIC PANEL
ALT: 20 U/L (ref 0–44)
AST: 30 U/L (ref 15–41)
Albumin: 1.7 g/dL — ABNORMAL LOW (ref 3.5–5.0)
Alkaline Phosphatase: 71 U/L (ref 38–126)
Anion gap: 12 (ref 5–15)
BUN: 74 mg/dL — ABNORMAL HIGH (ref 6–20)
CO2: 22 mmol/L (ref 22–32)
Calcium: 7.8 mg/dL — ABNORMAL LOW (ref 8.9–10.3)
Chloride: 105 mmol/L (ref 98–111)
Creatinine, Ser: 5.83 mg/dL — ABNORMAL HIGH (ref 0.61–1.24)
GFR, Estimated: 13 mL/min — ABNORMAL LOW (ref >60)
Glucose, Bld: 256 mg/dL — ABNORMAL HIGH (ref 70–99)
Potassium: 4.4 mmol/L (ref 3.5–5.1)
Sodium: 139 mmol/L (ref 135–145)
Total Bilirubin: 0.9 mg/dL (ref 0.3–1.2)
Total Protein: 5.3 g/dL — ABNORMAL LOW (ref 6.5–8.1)

## 2021-01-16 LAB — TRIGLYCERIDES: Triglycerides: 546 mg/dL — ABNORMAL HIGH (ref <150)

## 2021-01-16 LAB — GLUCOSE, CAPILLARY
Glucose-Capillary: 196 mg/dL — ABNORMAL HIGH (ref 70–99)
Glucose-Capillary: 227 mg/dL — ABNORMAL HIGH (ref 70–99)
Glucose-Capillary: 231 mg/dL — ABNORMAL HIGH (ref 70–99)
Glucose-Capillary: 235 mg/dL — ABNORMAL HIGH (ref 70–99)
Glucose-Capillary: 244 mg/dL — ABNORMAL HIGH (ref 70–99)
Glucose-Capillary: 250 mg/dL — ABNORMAL HIGH (ref 70–99)

## 2021-01-16 LAB — FIBRIN DERIVATIVES D-DIMER (ARMC ONLY): Fibrin derivatives D-dimer (ARMC): 2561.11 ng/mL (FEU) — ABNORMAL HIGH (ref 0.00–499.00)

## 2021-01-16 LAB — PHOSPHORUS: Phosphorus: 7.4 mg/dL — ABNORMAL HIGH (ref 2.5–4.6)

## 2021-01-16 LAB — MAGNESIUM: Magnesium: 2.4 mg/dL (ref 1.7–2.4)

## 2021-01-16 MED ORDER — DEXMEDETOMIDINE HCL IN NACL 400 MCG/100ML IV SOLN
0.4000 ug/kg/h | INTRAVENOUS | Status: DC
  Administered 2021-01-16: 0.6 ug/kg/h via INTRAVENOUS
  Administered 2021-01-16: 0.4 ug/kg/h via INTRAVENOUS
  Administered 2021-01-16 (×3): 0.6 ug/kg/h via INTRAVENOUS
  Administered 2021-01-17: 0.5 ug/kg/h via INTRAVENOUS
  Administered 2021-01-17 (×2): 0.6 ug/kg/h via INTRAVENOUS
  Filled 2021-01-16 (×9): qty 100

## 2021-01-16 MED ORDER — INSULIN ASPART 100 UNIT/ML ~~LOC~~ SOLN
0.0000 [IU] | SUBCUTANEOUS | Status: DC
  Administered 2021-01-16: 7 [IU] via SUBCUTANEOUS
  Administered 2021-01-17: 4 [IU] via SUBCUTANEOUS
  Administered 2021-01-17 (×4): 7 [IU] via SUBCUTANEOUS
  Administered 2021-01-17: 23:00:00 4 [IU] via SUBCUTANEOUS
  Administered 2021-01-18: 12:00:00 11 [IU] via SUBCUTANEOUS
  Administered 2021-01-18 (×2): 7 [IU] via SUBCUTANEOUS
  Administered 2021-01-18: 4 [IU] via SUBCUTANEOUS
  Administered 2021-01-18: 03:00:00 11 [IU] via SUBCUTANEOUS
  Administered 2021-01-18: 7 [IU] via SUBCUTANEOUS
  Administered 2021-01-19: 16:00:00 4 [IU] via SUBCUTANEOUS
  Administered 2021-01-19: 3 [IU] via SUBCUTANEOUS
  Administered 2021-01-19: 4 [IU] via SUBCUTANEOUS
  Administered 2021-01-19: 3 [IU] via SUBCUTANEOUS
  Administered 2021-01-19 – 2021-01-20 (×3): 4 [IU] via SUBCUTANEOUS
  Administered 2021-01-20 (×2): 7 [IU] via SUBCUTANEOUS
  Administered 2021-01-21 (×2): 4 [IU] via SUBCUTANEOUS
  Administered 2021-01-21 (×2): 3 [IU] via SUBCUTANEOUS
  Administered 2021-01-21: 4 [IU] via SUBCUTANEOUS
  Administered 2021-01-22 (×2): 7 [IU] via SUBCUTANEOUS
  Administered 2021-01-22 (×3): 3 [IU] via SUBCUTANEOUS
  Administered 2021-01-23 (×3): 7 [IU] via SUBCUTANEOUS
  Administered 2021-01-23: 4 [IU] via SUBCUTANEOUS
  Administered 2021-01-23 (×2): 7 [IU] via SUBCUTANEOUS
  Administered 2021-01-24: 4 [IU] via SUBCUTANEOUS
  Administered 2021-01-24 (×2): 3 [IU] via SUBCUTANEOUS
  Administered 2021-01-24: 7 [IU] via SUBCUTANEOUS
  Administered 2021-01-24: 3 [IU] via SUBCUTANEOUS
  Administered 2021-01-24: 4 [IU] via SUBCUTANEOUS
  Administered 2021-01-25: 20 [IU] via SUBCUTANEOUS
  Administered 2021-01-25 (×2): 3 [IU] via SUBCUTANEOUS
  Administered 2021-01-25: 15 [IU] via SUBCUTANEOUS
  Administered 2021-01-26 (×2): 3 [IU] via SUBCUTANEOUS
  Administered 2021-01-26: 7 [IU] via SUBCUTANEOUS
  Administered 2021-01-27: 3 [IU] via SUBCUTANEOUS
  Administered 2021-01-27: 7 [IU] via SUBCUTANEOUS
  Administered 2021-01-27: 4 [IU] via SUBCUTANEOUS
  Administered 2021-01-28 (×3): 7 [IU] via SUBCUTANEOUS
  Administered 2021-01-28 – 2021-01-29 (×3): 3 [IU] via SUBCUTANEOUS
  Filled 2021-01-16 (×63): qty 1

## 2021-01-16 MED ORDER — MIDAZOLAM 50MG/50ML (1MG/ML) PREMIX INFUSION
0.5000 mg/h | INTRAVENOUS | Status: DC
  Administered 2021-01-16: 08:00:00 0.5 mg/h via INTRAVENOUS
  Administered 2021-01-17: 1 mg/h via INTRAVENOUS
  Administered 2021-01-19 – 2021-01-23 (×5): 2 mg/h via INTRAVENOUS
  Filled 2021-01-16 (×7): qty 50

## 2021-01-16 MED ORDER — HEPARIN (PORCINE) IN NACL 2-0.9 UNITS/ML
Freq: Once | INTRAMUSCULAR | Status: DC
  Filled 2021-01-16: qty 1000

## 2021-01-16 MED ORDER — SODIUM CHLORIDE 0.9 % IV SOLN
100.0000 mg | Freq: Two times a day (BID) | INTRAVENOUS | Status: DC
  Administered 2021-01-16 – 2021-01-19 (×6): 100 mg via INTRAVENOUS
  Filled 2021-01-16 (×8): qty 100

## 2021-01-16 MED ORDER — VECURONIUM BROMIDE 10 MG IV SOLR
10.0000 mg | Freq: Once | INTRAVENOUS | Status: AC
  Administered 2021-01-16: 10 mg via INTRAVENOUS
  Filled 2021-01-16: qty 10

## 2021-01-16 MED ORDER — HEPARIN (PORCINE) 2000 UNITS/L FOR CRRT
INTRAVENOUS_CENTRAL | Status: DC | PRN
  Filled 2021-01-16 (×8): qty 1000

## 2021-01-16 NOTE — Progress Notes (Signed)
CRITICAL CARE NOTE 30 yo morbidly obese male with ASTHMA now with progressive multiorgan failure with COVID 19 pneumonia and ARDS with severe acidosis and severe hypoxia with ARDS with acute renal failure  2/1 Emergently intubated 2/2 severe resp failure, vasc cath placed 2/3 severe resp failure 01/15/2021- patient is critically Ill, CRRT continued.  Borderline low blood glucose today.   01/16/2021- patient with thickened heavy ETT secretions.  RN reporting inadequate sedation with patient making motions to remove airway, we have advanced RASS to range -3 to -4.  Remains critically ill with ongoing renal replacement therapy and COVID treatment.    CC    SUBJECTIVE Patient remains critically ill Prognosis is guarded  Vent Mode: PRVC FiO2 (%):  [24 %] 24 % Set Rate:  [25 bmp] 25 bmp Vt Set:  [550 mL] 550 mL PEEP:  [5 cmH20] 5 cmH20 Plateau Pressure:  [26 cmH20] 26 cmH20 CBC    Component Value Date/Time   WBC 14.3 (H) 01/16/2021 0419   RBC 4.09 (L) 01/16/2021 0419   HGB 10.6 (L) 01/16/2021 0419   HCT 35.0 (L) 01/16/2021 0419   PLT 143 (L) 01/16/2021 0419   MCV 85.6 01/16/2021 0419   MCH 25.9 (L) 01/16/2021 0419   MCHC 30.3 01/16/2021 0419   RDW 14.6 01/16/2021 0419   LYMPHSABS 1.3 01/16/2021 0419   MONOABS 1.4 (H) 01/16/2021 0419   EOSABS 0.0 01/16/2021 0419   BASOSABS 0.0 01/16/2021 0419   BMP Latest Ref Rng & Units 01/16/2021 01/15/2021 01/15/2021  Glucose 70 - 99 mg/dL 357(S) 177(L) 390(Z)  BUN 6 - 20 mg/dL 00(P) 23(R) 00(T)  Creatinine 0.61 - 1.24 mg/dL 6.22(Q) 3.33(L) 4.56(Y)  Sodium 135 - 145 mmol/L 139 143 144  Potassium 3.5 - 5.1 mmol/L 4.4 4.3 4.5  Chloride 98 - 111 mmol/L 105 111 113(H)  CO2 22 - 32 mmol/L 22 20(L) 23  Calcium 8.9 - 10.3 mg/dL 7.8(L) 7.4(L) 7.5(L)    BP (!) 110/54   Pulse 67   Temp (!) 97.34 F (36.3 C)   Resp (!) 25   Ht 6' (1.829 m)   Wt (!) 174.2 kg   SpO2 96%   BMI 52.09 kg/m    I/O last 3 completed shifts: In: 8445.9 [I.V.:3961.6;  BW/LS:9373.4; IV Piggyback:500] Out: 5268 [Urine:174; KAJGO:1157; Stool:900] Total I/O In: 1098.3 [I.V.:438.6; NG/GT:160; IV Piggyback:499.7] Out: 1477 [Urine:64; Other:1088; Stool:325]  SpO2: 96 % O2 Flow Rate (L/min): 7 L/min FiO2 (%): 24 %  Estimated body mass index is 52.09 kg/m as calculated from the following:   Height as of this encounter: 6' (1.829 m).   Weight as of this encounter: 174.2 kg.  SIGNIFICANT EVENTS   REVIEW OF SYSTEMS  PATIENT IS UNABLE TO PROVIDE COMPLETE REVIEW OF SYSTEMS DUE TO SEVERE CRITICAL ILLNESS       PHYSICAL EXAMINATION: GENERAL:obee age appropriate in NAD sedated on MV NECK: Supple.  PULMONARY: +rhonchi with mV sounds CARDIOVASCULAR: S1 and S2.  GASTROINTESTINAL: Soft, nontender, +Positive bowel sounds.  MUSCULOSKELETAL: No swelling, clubbing, or edema. +HD-access  NEUROLOGIC:  GCS4t on MV SKIN:grossly warm, edematous     MEDICATIONS: I have reviewed all medications and confirmed regimen as documented   CULTURE RESULTS   Recent Results (from the past 240 hour(s))  Blood culture (routine single)     Status: None (Preliminary result)   Collection Time: 01/12/21  9:05 AM   Specimen: BLOOD  Result Value Ref Range Status   Specimen Description BLOOD LEFT [facility]  Final   Special  Requests   Final    BOTTLES DRAWN AEROBIC ONLY Blood Culture results may not be optimal due to an inadequate volume of blood received in culture bottles   Culture   Final    NO GROWTH 4 DAYS Performed at [facility], [address] [place], [place] [address]    Report Status PENDING  Incomplete  SARS Coronavirus 2 by RT PCR (hospital order, performed in [facility] hospital lab) Nasopharyngeal Nasopharyngeal Swab     Status: Abnormal   Collection Time: 01/12/21  9:36 AM   Specimen: Nasopharyngeal Swab  Result Value Ref Range Status   SARS Coronavirus 2 POSITIVE (A) NEGATIVE Final    Comment: RESULT CALLED TO, READ BACK BY AND VERIFIED WITH: ANNA  JASPER ON 01/12/21 AT 1111 SDR (NOTE) SARS-CoV-2 target nucleic acids are DETECTED  SARS-CoV-2 RNA is generally detectable in upper respiratory specimens  during the acute phase of infection.  Positive results are indicative  of the presence of the identified virus, but do not rule out bacterial infection or co-infection with other pathogens not detected by the test.  Clinical correlation with patient history and  other diagnostic information is necessary to determine patient infection status.  The expected result is negative.  Fact Sheet for Patients:   BoilerBrush.com.cy   Fact Sheet for Healthcare Providers:   https://pope.com/    This test is not yet approved or cleared by the [place] FDA and  has been authorized for detection and/or diagnosis of SARS-CoV-2 by FDA under an Emergency Use Authorization (EUA).  This EUA will remain in effect (meaning this tes t can be used) for the duration of  the COVID-19 declaration under Section 564(b)(1) of the Act, 21 U.S.C. section 360-bbb-3(b)(1), unless the authorization is terminated or revoked sooner.  Performed at [facility], [address] [place], [place] [address]   Culture, blood (single)     Status: None (Preliminary result)   Collection Time: 01/12/21  1:08 PM   Specimen: BLOOD  Result Value Ref Range Status   Specimen Description BLOOD BLOOD RIGHT HAND  Final   Special Requests   Final    BOTTLES DRAWN AEROBIC AND ANAEROBIC Blood Culture adequate volume   Culture   Final    NO GROWTH 4 DAYS Performed at [facility], [address], [place], [place] [address]    Report Status PENDING  Incomplete  MRSA PCR Screening     Status: None   Collection Time: 01/12/21  7:22 PM   Specimen: Nasopharyngeal  Result Value Ref Range Status   MRSA by PCR NEGATIVE NEGATIVE Final    Comment:        The GeneXpert MRSA Assay (FDA approved for NASAL  specimens only), is one component of a comprehensive MRSA colonization surveillance program. It is not intended to diagnose MRSA infection nor to guide or monitor treatment for MRSA infections. Performed at [facility], [address] [place], [place] [address]   Culture, respiratory (non-expectorated)     Status: None   Collection Time: 01/13/21  3:04 PM   Specimen: Tracheal Aspirate; Respiratory  Result Value Ref Range Status   Specimen Description   Final    TRACHEAL ASPIRATE Performed at [facility], [address], [place], [place] [address]    Special Requests   Final    NONE Performed at [facility], [address] [place], [place] [address]    Gram Stain   Final    ABUNDANT  WBC PRESENT, PREDOMINANTLY PMN MODERATE GRAM POSITIVE COCCI Performed at [facility], [address], [place], [place] [address]    Culture FEW STAPHYLOCOCCUS AUREUS  Final   Report Status 01/16/2021 FINAL  Final   Organism ID, Bacteria STAPHYLOCOCCUS AUREUS  Final      Susceptibility   Staphylococcus aureus - MIC*    CIPROFLOXACIN <=0.5 SENSITIVE Sensitive     ERYTHROMYCIN <=0.25 SENSITIVE Sensitive     GENTAMICIN <=0.5 SENSITIVE Sensitive     OXACILLIN 0.5 SENSITIVE Sensitive     TETRACYCLINE <=1 SENSITIVE Sensitive     VANCOMYCIN 1 SENSITIVE Sensitive     TRIMETH/SULFA <=10 SENSITIVE Sensitive     CLINDAMYCIN <=0.25 SENSITIVE Sensitive     RIFAMPIN <=0.5 SENSITIVE Sensitive     Inducible Clindamycin NEGATIVE Sensitive     * FEW STAPHYLOCOCCUS AUREUS          IMAGING    No results found.   Nutrition Status: Nutrition Problem: Inadequate oral intake Etiology: inability to eat Signs/Symptoms: NPO status Interventions: Tube feeding,Prostat     Indwelling Urinary Catheter continued, requirement due to   Reason to continue Indwelling Urinary Catheter strict Intake/Output monitoring for hemodynamic instability   Central Line/ continued,  requirement due to  Reason to continue [employer] Monitoring of central venous pressure or other hemodynamic parameters and poor IV access   Ventilator continued, requirement due to severe respiratory failure   Ventilator Sedation RASS 0 to -2      ASSESSMENT AND PLAN SYNOPSIS 30 yo morbidly obese male with ASTHMA now with progressive multiorgan failure with COVID 19 pneumonia and ARDS with severe acidosis and severe hypoxia with ARDS with acute renal failure    Acute hypoxemic respiratory failure due to COVID-19 pneumonia/ARDS Mechanical ventilation via ARDS protocol, target PRVC 6 cc/kg Wean PEEP and FiO2 as able Goal plateau pressure less than 30, driving pressure less than 15 Paralytics if necessary for vent synchrony, gas exchange -difficulty with prone positioning due to CRRT , and morbid body habitus Deep sedation per PAD protocol VAP prevention order set Remdesivir Therapy IV STEROIDS Therapy Follow inflammatory markers as needed with CRP Vitamin C, zinc Plan to repeat and check resp cultures as needed    -Minimal settings on PRVC - FiO2 down to 24%, heavy thickened secretions today. Tracheal aspirate ordered today. Will continue with negative fluid balance via CRRT -continue Full MV support -continue Bronchodilator Therapy -Wean Fio2 and PEEP as tolerated -VAP/VENT bundle implementation     Morbid obesity, possible OSA.   Will certainly impact respiratory mechanics, ventilator weaning Suspect will need to consider additional PEEP   ACUTE KIDNEY INJURY/Renal Failure-stage 5 -continue Foley Catheter-assess need -Avoid nephrotoxic agents -Follow urine output, BMP -Ensure adequate renal perfusion, optimize oxygenation -Renal dose medications           -CRRT per nephro    NEUROLOGY Acute toxic metabolic encephalopathy, need for sedation Goal RASS - -3 to -4  SEPSIS due to staph aureus pneumonia        - present on admission - due to post Viral pneumonia  with COVID19 -use vasopressors to keep MAP>65 as needed -follow ABG and LA -follow up cultures -emperic ABX-doxy bid IV 100           MRSA PCR ordered  CARDIAC ICU monitoring  ID -continue IV abx as prescibed -follow up cultures  GI GI PROPHYLAXIS as indicated   DIET-->TF's as tolerated Constipation protocol as indicated  ENDO - will use ICU  hypoglycemic\Hyperglycemia protocol if indicated    ELECTROLYTES -follow labs as needed -replace as needed -pharmacy consultation and following   DVT/GI PRX ordered and assessed TRANSFUSIONS AS NEEDED MONITOR FSBS I Assessed the need for Labs I Assessed the need for Foley I Assessed the need for Central Venous Line Family Discussion when available I Assessed the need for Mobilization I made an Assessment of medications to be adjusted accordingly Safety Risk assessment completed   CASE DISCUSSED IN MULTIDISCIPLINARY ROUNDS WITH ICU TEAM  Critical Care Time devoted to patient care services described in this note is 33 minutes.   Overall, patient is critically ill, prognosis is guarded.  Patient with Multiorgan failure and at high risk for cardiac arrest and death.     Vida Rigger, M.D.  Pulmonary & Critical Care Medicine  [facility], [place]

## 2021-01-16 NOTE — Progress Notes (Signed)
[place], [place] 01/16/21  Subjective:   Hospital day # 4 Patient remains intubated and sedated and on propofol.  However triglycerides noted to be high. Appears to be tolerating CRRT well. Urine output appears to be in the oliguric range at 134 cc over the preceding 24 hours. Renal:  02/04 0701 - 02/05 0700 In: 4243.6 [I.V.:2275.1; ZM/CE:0223.3; IV Piggyback:500] Out: 6122 [Urine:134; Stool:900] Lab Results  Component Value Date   CREATININE 5.83 (H) 01/16/2021   CREATININE 6.52 (H) 01/15/2021   CREATININE 6.56 (H) 01/15/2021      Objective:  Vital signs in last 24 hours:  Temp:  [97.7 F (36.5 C)-100.58 F (38.1 C)] 97.88 F (36.6 C) (02/05 0745) Pulse Rate:  [74-103] 93 (02/05 0745) Resp:  [22-26] 25 (02/05 0745) BP: (89-135)/(44-70) 118/60 (02/05 0745) SpO2:  [88 %-99 %] 96 % (02/05 0802) FiO2 (%):  [24 %] 24 % (02/05 0802)  Weight change:  Filed Weights   01/12/21 1205 01/13/21 0402 01/15/21 0350  Weight: (!) 159.6 kg (!) 164.9 kg (!) 174.2 kg    Intake/Output:    Intake/Output Summary (Last 24 hours) at 01/16/2021 0846 Last data filed at 01/16/2021 0800 Gross per 24 hour  Intake 3988.58 ml  Output 4806 ml  Net -817.42 ml    Physical Exam: General: Critically ill-appearing  HEENT ETT in place  Lungs: Vent assited  Heart:: Regular  Abdomen: Nondistended  Extremities: no edema  Neurologic: sedated  Skin: No acute rash  Access: left IJ temp cath  Foley: Present      Basic Metabolic Panel:  Recent Labs  Lab 01/13/21 0319 01/13/21 0851 01/14/21 0230 01/14/21 0931 01/14/21 1307 01/15/21 0402 01/15/21 1549 01/16/21 0419  NA 158*   < > 155* 153* 148* 144 143 139  K 4.7   < > 4.8 4.5 5.1 4.5 4.3 4.4  CL 117*   < > 119* 116* 115* 113* 111 105  CO2 25   < > 23 23 19* 23 20* 22  GLUCOSE 253*   < > 134* 181* 200* 213* 217* 256*  BUN 73*   < > 75* 77* 78* 80* 81* 74*  CREATININE 3.66*   < > 5.86* 6.48* 6.40* 6.56*  6.52* 5.83*  CALCIUM 8.3*   < > 8.1* 7.9* 7.6* 7.5* 7.4* 7.8*  MG 3.9*  --  3.5*  --   --  2.8*  --  2.4  PHOS 5.6*  --  5.7*  --  6.3* 7.8* 7.4* 7.4*   < > = values in this interval not displayed.     CBC: Recent Labs  Lab 01/12/21 1308 01/13/21 0319 01/14/21 0230 01/15/21 0402 01/16/21 0419  WBC 17.9* 15.0* 16.0* 15.0* 14.3*  NEUTROABS 14.9* 10.7* 12.8* 12.1* 10.7*  HGB 15.3 15.3 12.5* 11.5* 10.6*  HCT 53.5* 51.1 43.9 39.3 35.0*  MCV 88.9 86.8 89.8 87.9 85.6  PLT 441* 403* 274 205 143*     No results found for: HEPBSAG, HEPBSAB, HEPBIGM    Microbiology:  Recent Results (from the past 240 hour(s))  Blood culture (routine single)     Status: None (Preliminary result)   Collection Time: 01/12/21  9:05 AM   Specimen: BLOOD  Result Value Ref Range Status   Specimen Description BLOOD LEFT AC  Final   Special Requests   Final    BOTTLES DRAWN AEROBIC ONLY Blood Culture results may not be optimal due to an inadequate volume of blood received in culture bottles  Culture   Final    NO GROWTH 4 DAYS Performed at [facility], [address] [place], [place] [address]    Report Status PENDING  Incomplete  SARS Coronavirus 2 by RT PCR (hospital order, performed in [facility] hospital lab) Nasopharyngeal Nasopharyngeal Swab     Status: Abnormal   Collection Time: 01/12/21  9:36 AM   Specimen: Nasopharyngeal Swab  Result Value Ref Range Status   SARS Coronavirus 2 POSITIVE (A) NEGATIVE Final    Comment: RESULT CALLED TO, READ BACK BY AND VERIFIED WITH: ANNA JASPER ON 01/12/21 AT 15 SDR (NOTE) SARS-CoV-2 target nucleic acids are DETECTED  SARS-CoV-2 RNA is generally detectable in upper respiratory specimens  during the acute phase of infection.  Positive results are indicative  of the presence of the identified virus, but do not rule out bacterial infection or co-infection with other pathogens not detected by the test.  Clinical correlation with patient history  and  other diagnostic information is necessary to determine patient infection status.  The expected result is negative.  Fact Sheet for Patients:   StrictlyIdeas.no   Fact Sheet for Healthcare Providers:   BankingDealers.co.za    This test is not yet approved or cleared by the [place] FDA and  has been authorized for detection and/or diagnosis of SARS-CoV-2 by FDA under an Emergency Use Authorization (EUA).  This EUA will remain in effect (meaning this tes t can be used) for the duration of  the COVID-19 declaration under Section 564(b)(1) of the Act, 21 U.S.C. section 360-bbb-3(b)(1), unless the authorization is terminated or revoked sooner.  Performed at [facility], [address] [place], [place] [address]   Culture, blood (single)     Status: None (Preliminary result)   Collection Time: 01/12/21  1:08 PM   Specimen: BLOOD  Result Value Ref Range Status   Specimen Description BLOOD BLOOD RIGHT HAND  Final   Special Requests   Final    BOTTLES DRAWN AEROBIC AND ANAEROBIC Blood Culture adequate volume   Culture   Final    NO GROWTH 4 DAYS Performed at [facility], [address], [place], [place] [address]    Report Status PENDING  Incomplete  MRSA PCR Screening     Status: None   Collection Time: 01/12/21  7:22 PM   Specimen: Nasopharyngeal  Result Value Ref Range Status   MRSA by PCR NEGATIVE NEGATIVE Final    Comment:        The GeneXpert MRSA Assay (FDA approved for NASAL specimens only), is one component of a comprehensive MRSA colonization surveillance program. It is not intended to diagnose MRSA infection nor to guide or monitor treatment for MRSA infections. Performed at [facility], [address] [place], [place] [address]   Culture, respiratory (non-expectorated)     Status: None (Preliminary result)   Collection Time: 01/13/21  3:04 PM   Specimen: Tracheal Aspirate;  Respiratory  Result Value Ref Range Status   Specimen Description   Final    TRACHEAL ASPIRATE Performed at [facility], [address] [place], [place] [address]    Special Requests   Final    NONE Performed at [facility], [address] [place],  [address]    Gram Stain   Final    ABUNDANT WBC PRESENT, PREDOMINANTLY PMN MODERATE GRAM POSITIVE COCCI    Culture   Final    FEW STAPHYLOCOCCUS AUREUS SUSCEPTIBILITIES TO FOLLOW Performed at [facility], [address] [place], [place] [address]    Report Status PENDING  Incomplete    Coagulation Studies: No results for input(s): LABPROT, INR in the last 72 hours.  Urinalysis: Recent Labs    01/13/21 1046  COLORURINE AMBER*  LABSPEC 1.027  PHURINE 5.0  GLUCOSEU 50*  HGBUR SMALL*  BILIRUBINUR NEGATIVE  KETONESUR 5*  PROTEINUR 30*  NITRITE NEGATIVE  LEUKOCYTESUR TRACE*      Imaging: DG Chest Port 1 View  Result Date: 01/15/2021 CLINICAL DATA:  Central line placement EXAM: PORTABLE CHEST 1 VIEW COMPARISON:  Radiograph 01/14/2021 FINDINGS: Endotracheal tube tip terminates 4.1 cm from the carina. Transesophageal tube tip and side port terminate below the GE junction, beyond the margins of imaging. Dual lumen left IJ approach central venous catheter tip terminates within the right atrium. Telemetry leads and external devices overlie the chest. Patchy heterogeneous opacities are present in both lungs, with a mid to lower lung predominance. No pneumothorax. No effusion. Stable cardiomediastinal contours. No acute osseous or soft tissue abnormality. IMPRESSION: 1. Endotracheal tube tip terminates 4.1 cm from the carina. 2. Transesophageal tube tip and side port terminate below the GE junction, beyond the margins of imaging. 3. Persistent heterogeneous bilateral opacities, compatible with COVID-19 pneumonia. Electronically Signed   By: Lovena Le M.D.   On: 01/15/2021 06:37   DG Chest Port 1  View  Result Date: 01/14/2021 CLINICAL DATA:  Respiratory failure.  COVID pneumonia. EXAM: PORTABLE CHEST 1 VIEW COMPARISON:  Radiograph yesterday. FINDINGS: Endotracheal tube tip 3.9 cm from the carina. Enteric tube in place with tip below the diaphragm not included in the field of view. Previous right internal jugular central line is not definitively seen. Improved retrocardiac opacity from prior exam. Background patchy heterogeneous opacities persist, not significantly changed. Stable heart size and mediastinal contours. No pneumothorax or large pleural effusion. IMPRESSION: 1. Improved retrocardiac opacity. Additional heterogeneous bilateral lung opacities are not significantly changed, typical of COVID pneumonia. 2. Endotracheal tube tip 3.9 cm from the carina. Enteric tube in place. Electronically Signed   By: Keith Rake M.D.   On: 01/14/2021 17:40     Medications:   . sodium chloride 10 mL/hr at 01/15/21 1128  . azithromycin Stopped (01/15/21 1153)  . cefTRIAXone (ROCEPHIN)  IV Stopped (01/15/21 1158)  . dexmedetomidine (PRECEDEX) IV infusion 0.4 mcg/kg/hr (01/16/21 0828)  . dextrose    . famotidine (PEPCID) IV 20 mg (01/16/21 0832)  . fentaNYL infusion INTRAVENOUS 300 mcg/hr (01/16/21 0831)  . midazolam 0.5 mg/hr (01/16/21 0826)  . norepinephrine (LEVOPHED) Adult infusion 10 mcg/min (01/16/21 0800)  . prismasol BGK 2/2.5 dialysis solution 1,000 mL/hr at 01/16/21 0604  . prismasol BGK 2/2.5 replacement solution 200 mL/hr at 01/16/21 0749  . prismasol BGK 2/2.5 replacement solution 200 mL/hr at 01/16/21 0606  . propofol (DIPRIVAN) infusion 40 mcg/kg/min (01/16/21 0822)  . remdesivir 100 mg in NS 100 mL Stopped (01/15/21 1245)   . chlorhexidine gluconate (MEDLINE KIT)  15 mL Mouth Rinse BID  . Chlorhexidine Gluconate Cloth  6 each Topical Daily  . docusate  100 mg Per Tube BID  . feeding supplement (PROSource TF)  90 mL Per Tube QID  . feeding supplement (VITAL HIGH PROTEIN)   1,000 mL Per Tube Q24H  . heparin  5,000 Units Subcutaneous Q8H  . insulin aspart  0-15 Units Subcutaneous Q4H  . insulin aspart  3 Units Subcutaneous Q4H  . insulin detemir  35 Units Subcutaneous Daily  . mouth rinse  15  mL Mouth Rinse 10 times per day  . methylPREDNISolone (SOLU-MEDROL) injection  40 mg Intravenous Q12H  . multivitamin  15 mL Per Tube Daily  . polyethylene glycol  17 g Per Tube Daily  . sodium chloride flush  3 mL Intravenous Q12H   sodium chloride, acetaminophen (TYLENOL) oral liquid 160 mg/5 mL, dextrose, docusate sodium, fentaNYL, heparin, heparin, ibuprofen, midazolam, midazolam, ondansetron **OR** ondansetron (ZOFRAN) IV, ondansetron (ZOFRAN) IV, polyethylene glycol, sodium chloride, sodium chloride flush  Assessment/ Plan:  30 y.o. male with h/o Asthma, no recent medical care , was  admitted on 01/12/2021 for ARDS (adult respiratory distress syndrome) ([place]) [J80] Acute respiratory failure with hypoxia ([place]) [J96.01] Acute hypoxemic respiratory failure due to COVID-19 ([facility]) [U07.1, J96.01] Hyperosmolar hyperglycemic state (HHS) ([place]) [E11.00, E11.65] Pneumonia due to COVID-19 virus [U07.1, J12.82]  # AKI Baseline creatinine is unknown U/a- 6-10 RBCs, 11-20 WBCs, UPC of 0.24 US renal - unremarkable, no obstruction -Urine output remains in the oliguric range.  Therefore we will continue the patient on CRRT at this time with current parameters.  Continue to monitor serum electrolytes.  # hypernatremia -Sodium now down to 139.  Continue to monitor.   # New Dx of Diabetes, type 1 uncontrolled Lab Results  Component Value Date   HGBA1C 13.6 (H) 01/12/2021  Markedly elevated hemoglobin A1c as documented above.  Insulin management as per pulmonary/critical care.  # Acute respiratory failure # Covid 19-infection Patient remains ventilator dependent at this time.  Propofol likely to be switched to alternative sedation.  Continue current vent support as per  pulmonary/critical care.     LOS: 4 Olando Willems 2/5/20228:46 AM  [place], [place]  Note: This note was prepared with Dragon dictation. Any transcription errors are unintentional

## 2021-01-16 NOTE — Plan of Care (Signed)
Patient has remained sedated and on vent this shift. Levo weaned from to . Remains on CRRT, CRRT running well without issues.

## 2021-01-16 NOTE — Progress Notes (Signed)
PHARMACY CONSULT NOTE   Pharmacy Consult for Electrolyte Monitoring and Replacement   Recent Labs: Potassium (mmol/L)  Date Value  01/16/2021 4.4   Magnesium (mg/dL)  Date Value  26/83/4196 2.4   Calcium (mg/dL)  Date Value  22/29/7989 7.8 (L)   Albumin (g/dL)  Date Value  21/19/4174 1.7 (L)   Phosphorus (mg/dL)  Date Value  07/25/4817 7.4 (H)   Sodium (mmol/L)  Date Value  01/16/2021 139    Assessment: 30 year old male admitted with COVID-19 pneumonia and DKA. Started on an insulin drip. Patient with hypoxic respiratory failure requiring intubation 2/1. Patient hypotensive and requiring blood pressure support with Levophed. Patient remains intubated sedated and on mechanical ventilation in the ICU. Pharmacy to manage electrolytes.  Nutrition: Tube feeds + free water flushes 200 mL q2h (2.4 L/day)  MIVF: D5LR at 125 mL/hr switched to D10 @40  mL/hr.  Patient with worsening AKI since admission despite fluid resuscitation. Nephrology following. Starting CRRT 2/3. Scr starting to trend down.   Goal of Therapy:  Electrolytes WNL  Plan:  Patient remains on CRRT. Insulin infusion d/c'ed and switched over to Sq insulin. Labs currently ordered BID per CRRT. Do not see need for checking more frequently at this time. No replacement needed at this time.  Continue to follow along.  , PharmD, BCPS 01/16/2021 8:55 AM

## 2021-01-17 ENCOUNTER — Encounter: Payer: Self-pay | Admitting: Internal Medicine

## 2021-01-17 LAB — CBC WITH DIFFERENTIAL/PLATELET
Abs Immature Granulocytes: 0.7 10*3/uL — ABNORMAL HIGH (ref 0.00–0.07)
Basophils Absolute: 0 10*3/uL (ref 0.0–0.1)
Basophils Relative: 0 %
Eosinophils Absolute: 0 10*3/uL (ref 0.0–0.5)
Eosinophils Relative: 0 %
HCT: 30.6 % — ABNORMAL LOW (ref 39.0–52.0)
Hemoglobin: 9.9 g/dL — ABNORMAL LOW (ref 13.0–17.0)
Immature Granulocytes: 7 %
Lymphocytes Relative: 14 %
Lymphs Abs: 1.5 10*3/uL (ref 0.7–4.0)
MCH: 26.3 pg (ref 26.0–34.0)
MCHC: 32.4 g/dL (ref 30.0–36.0)
MCV: 81.4 fL (ref 80.0–100.0)
Monocytes Absolute: 1 10*3/uL (ref 0.1–1.0)
Monocytes Relative: 10 %
Neutro Abs: 7.2 10*3/uL (ref 1.7–7.7)
Neutrophils Relative %: 69 %
Platelets: 111 10*3/uL — ABNORMAL LOW (ref 150–400)
RBC: 3.76 MIL/uL — ABNORMAL LOW (ref 4.22–5.81)
RDW: 14.6 % (ref 11.5–15.5)
Smear Review: NORMAL
WBC: 10.4 10*3/uL (ref 4.0–10.5)
nRBC: 0.4 % — ABNORMAL HIGH (ref 0.0–0.2)

## 2021-01-17 LAB — GLUCOSE, CAPILLARY
Glucose-Capillary: 226 mg/dL — ABNORMAL HIGH (ref 70–99)
Glucose-Capillary: 242 mg/dL — ABNORMAL HIGH (ref 70–99)
Glucose-Capillary: 161 mg/dL — ABNORMAL HIGH (ref 70–99)
Glucose-Capillary: 203 mg/dL — ABNORMAL HIGH (ref 70–99)
Glucose-Capillary: 222 mg/dL — ABNORMAL HIGH (ref 70–99)
Glucose-Capillary: 207 mg/dL — ABNORMAL HIGH (ref 70–99)
Glucose-Capillary: 200 mg/dL — ABNORMAL HIGH (ref 70–99)

## 2021-01-17 LAB — COMPREHENSIVE METABOLIC PANEL
ALT: 21 U/L (ref 0–44)
AST: 26 U/L (ref 15–41)
Albumin: 1.7 g/dL — ABNORMAL LOW (ref 3.5–5.0)
Alkaline Phosphatase: 74 U/L (ref 38–126)
Anion gap: 12 (ref 5–15)
BUN: 64 mg/dL — ABNORMAL HIGH (ref 6–20)
CO2: 23 mmol/L (ref 22–32)
Calcium: 8.2 mg/dL — ABNORMAL LOW (ref 8.9–10.3)
Chloride: 102 mmol/L (ref 98–111)
Creatinine, Ser: 4.88 mg/dL — ABNORMAL HIGH (ref 0.61–1.24)
GFR, Estimated: 16 mL/min — ABNORMAL LOW (ref >60)
Glucose, Bld: 245 mg/dL — ABNORMAL HIGH (ref 70–99)
Potassium: 3.8 mmol/L (ref 3.5–5.1)
Sodium: 137 mmol/L (ref 135–145)
Total Bilirubin: 0.6 mg/dL (ref 0.3–1.2)
Total Protein: 5.4 g/dL — ABNORMAL LOW (ref 6.5–8.1)

## 2021-01-17 LAB — RENAL FUNCTION PANEL
Albumin: 1.7 g/dL — ABNORMAL LOW (ref 3.5–5.0)
Anion gap: 11 (ref 5–15)
BUN: 65 mg/dL — ABNORMAL HIGH (ref 6–20)
CO2: 21 mmol/L — ABNORMAL LOW (ref 22–32)
Calcium: 8 mg/dL — ABNORMAL LOW (ref 8.9–10.3)
Chloride: 104 mmol/L (ref 98–111)
Creatinine, Ser: 4.35 mg/dL — ABNORMAL HIGH (ref 0.61–1.24)
GFR, Estimated: 18 mL/min — ABNORMAL LOW (ref >60)
Glucose, Bld: 249 mg/dL — ABNORMAL HIGH (ref 70–99)
Phosphorus: 4.9 mg/dL — ABNORMAL HIGH (ref 2.5–4.6)
Potassium: 3.7 mmol/L (ref 3.5–5.1)
Sodium: 136 mmol/L (ref 135–145)

## 2021-01-17 LAB — TROPONIN I (HIGH SENSITIVITY)
Troponin I (High Sensitivity): 12 ng/L (ref <18)
Troponin I (High Sensitivity): 13 ng/L (ref <18)
Troponin I (High Sensitivity): 11 ng/L (ref <18)

## 2021-01-17 LAB — FIBRIN DERIVATIVES D-DIMER (ARMC ONLY): Fibrin derivatives D-dimer (ARMC): 1441.69 ng{FEU}/mL — ABNORMAL HIGH (ref 0.00–499.00)

## 2021-01-17 LAB — CULTURE, BLOOD (SINGLE)
Culture: NO GROWTH
Culture: NO GROWTH
Special Requests: ADEQUATE

## 2021-01-17 LAB — C-REACTIVE PROTEIN
CRP: 8.1 mg/dL — ABNORMAL HIGH (ref <1.0)
CRP: 7.7 mg/dL — ABNORMAL HIGH (ref <1.0)

## 2021-01-17 LAB — BRAIN NATRIURETIC PEPTIDE: B Natriuretic Peptide: 60.4 pg/mL (ref 0.0–100.0)

## 2021-01-17 LAB — TRIGLYCERIDES: Triglycerides: 483 mg/dL — ABNORMAL HIGH

## 2021-01-17 LAB — PHOSPHORUS: Phosphorus: 5.9 mg/dL — ABNORMAL HIGH (ref 2.5–4.6)

## 2021-01-17 MED ORDER — DEXMEDETOMIDINE HCL IN NACL 400 MCG/100ML IV SOLN
0.4000 ug/kg/h | INTRAVENOUS | Status: DC
  Administered 2021-01-17: 21:00:00 0.6 ug/kg/h via INTRAVENOUS
  Administered 2021-01-17: 0.5 ug/kg/h via INTRAVENOUS
  Administered 2021-01-18 (×2): 1 ug/kg/h via INTRAVENOUS
  Administered 2021-01-18: 19:00:00 0.8 ug/kg/h via INTRAVENOUS
  Administered 2021-01-18: 0.6 ug/kg/h via INTRAVENOUS
  Administered 2021-01-18: 0.4 ug/kg/h via INTRAVENOUS
  Administered 2021-01-18: 22:00:00 0.8 ug/kg/h via INTRAVENOUS
  Administered 2021-01-18: 1.2 ug/kg/h via INTRAVENOUS
  Administered 2021-01-18 – 2021-01-19 (×2): 0.6 ug/kg/h via INTRAVENOUS
  Administered 2021-01-19 (×6): 0.8 ug/kg/h via INTRAVENOUS
  Administered 2021-01-20: 0.6 ug/kg/h via INTRAVENOUS
  Administered 2021-01-20 – 2021-01-21 (×8): 0.8 ug/kg/h via INTRAVENOUS
  Administered 2021-01-21: 0.6 ug/kg/h via INTRAVENOUS
  Administered 2021-01-21 (×2): 0.8 ug/kg/h via INTRAVENOUS
  Administered 2021-01-21: 0.6 ug/kg/h via INTRAVENOUS
  Administered 2021-01-21: 0.8 ug/kg/h via INTRAVENOUS
  Administered 2021-01-21 – 2021-01-23 (×11): 0.6 ug/kg/h via INTRAVENOUS
  Filled 2021-01-17 (×2): qty 100
  Filled 2021-01-17: qty 200
  Filled 2021-01-17 (×5): qty 100
  Filled 2021-01-17: qty 200
  Filled 2021-01-17 (×11): qty 100
  Filled 2021-01-17: qty 200
  Filled 2021-01-17 (×17): qty 100
  Filled 2021-01-17: qty 200
  Filled 2021-01-17 (×2): qty 100

## 2021-01-17 NOTE — Plan of Care (Signed)
Pt has remained alert to voice/pain, RASS -3, afebrile, CPOT 0-2. Pt has remained on 24% FiO2 PRVC; moderate tan, thick secretions. No WUA/SBT per Aleskerov, MD. SpO2 > 90%, lung sounds with rhonchi to bilateral upper lobes, diminished in the bases. Pt has remained in NSR on cardiac monitor, HR WNL. BP remains supported with minimal dose quad strength Levo. Cardiac monitor alarming for STE->EKG, troponin x 3, BNP per Karna Christmas, MD.  Pt has remained on CRRT with no complications. Minimal UO -> bladder scan 7ml. Mother updated via phone.   Problem: Nutrition: Goal: Adequate nutrition will be maintained Outcome: Progressing   Problem: Pain Managment: Goal: General experience of comfort will improve Outcome: Progressing   Problem: Safety: Goal: Ability to remain free from injury will improve Outcome: Progressing   Problem: Skin Integrity: Goal: Risk for impaired skin integrity will decrease Outcome: Progressing

## 2021-01-17 NOTE — Progress Notes (Signed)
[place], [place] 01/17/21  Subjective:   Hospital day # 5 Remains critically ill.   Oliguric UOP.  Cr remains high at 4.88.   Renal:  02/05 0701 - 02/06 0700 In: 3805.3 [I.V.:1765.5; NG/GT:1040; IV Piggyback:999.8] Out: 1025 [Urine:310; Stool:575] Lab Results  Component Value Date   CREATININE 4.88 (H) 01/17/2021   CREATININE 5.15 (H) 01/16/2021   CREATININE 5.83 (H) 01/16/2021      Objective:  Vital signs in last 24 hours:  Temp:  [97.9 F (36.6 C)-99.3 F (37.4 C)] 98.6 F (37 C) (02/06 1400) Pulse Rate:  [64-80] 80 (02/06 1400) Resp:  [25] 25 (02/06 1400) BP: (89-127)/(48-65) 105/57 (02/06 1400) SpO2:  [93 %-99 %] 96 % (02/06 1400) FiO2 (%):  [24 %] 24 % (02/06 1400) Weight:  [195.5 kg] 195.5 kg (02/06 0500)  Weight change:  Filed Weights   01/13/21 0402 01/15/21 0350 01/17/21 0500  Weight: (!) 164.9 kg (!) 174.2 kg (!) 195.5 kg    Intake/Output:    Intake/Output Summary (Last 24 hours) at 01/17/2021 1419 Last data filed at 01/17/2021 1400 Gross per 24 hour  Intake 3276.19 ml  Output 3303 ml  Net -26.81 ml    Physical Exam: General: Critically ill-appearing  HEENT ETT in place  Lungs: Vent assisted  Heart:: Regular  Abdomen: Mild distension  Extremities: no edema  Neurologic: sedated  Skin: No acute rash  Access: left IJ temp cath  Foley: Present      Basic Metabolic Panel:  Recent Labs  Lab 01/13/21 0319 01/13/21 0851 01/14/21 0230 01/14/21 0931 01/15/21 0402 01/15/21 1549 01/16/21 0419 01/16/21 1621 01/17/21 0433  NA 158*   < > 155*   < > 144 143 139 136 137  K 4.7   < > 4.8   < > 4.5 4.3 4.4 4.2 3.8  CL 117*   < > 119*   < > 113* 111 105 105 102  CO2 25   < > 23   < > 23 20* 22 21* 23  GLUCOSE 253*   < > 134*   < > 213* 217* 256* 304* 245*  BUN 73*   < > 75*   < > 80* 81* 74* 71* 64*  CREATININE 3.66*   < > 5.86*   < > 6.56* 6.52* 5.83* 5.15* 4.88*  CALCIUM 8.3*   < > 8.1*   < > 7.5* 7.4* 7.8*  7.8* 8.2*  MG 3.9*  --  3.5*  --  2.8*  --  2.4  --   --   PHOS 5.6*  --  5.7*   < > 7.8* 7.4* 7.4* 6.7* 5.9*   < > = values in this interval not displayed.     CBC: Recent Labs  Lab 01/13/21 0319 01/14/21 0230 01/15/21 0402 01/16/21 0419 01/17/21 0433  WBC 15.0* 16.0* 15.0* 14.3* 10.4  NEUTROABS 10.7* 12.8* 12.1* 10.7* 7.2  HGB 15.3 12.5* 11.5* 10.6* 9.9*  HCT 51.1 43.9 39.3 35.0* 30.6*  MCV 86.8 89.8 87.9 85.6 81.4  PLT 403* 274 205 143* 111*     No results found for: HEPBSAG, HEPBSAB, HEPBIGM    Microbiology:  Recent Results (from the past 240 hour(s))  Blood culture (routine single)     Status: None   Collection Time: 01/12/21  9:05 AM   Specimen: BLOOD  Result Value Ref Range Status   Specimen Description BLOOD LEFT [facility]  Final   Special Requests   Final    BOTTLES  DRAWN AEROBIC ONLY Blood Culture results may not be optimal due to an inadequate volume of blood received in culture bottles   Culture   Final    NO GROWTH 5 DAYS Performed at [facility], [address] [place], [place] [address]    Report Status 01/17/2021 FINAL  Final  SARS Coronavirus 2 by RT PCR (hospital order, performed in [facility] hospital lab) Nasopharyngeal Nasopharyngeal Swab     Status: Abnormal   Collection Time: 01/12/21  9:36 AM   Specimen: Nasopharyngeal Swab  Result Value Ref Range Status   SARS Coronavirus 2 POSITIVE (A) NEGATIVE Final    Comment: RESULT CALLED TO, READ BACK BY AND VERIFIED WITH: ANNA JASPER ON 01/12/21 AT 1111 SDR (NOTE) SARS-CoV-2 target nucleic acids are DETECTED  SARS-CoV-2 RNA is generally detectable in upper respiratory specimens  during the acute phase of infection.  Positive results are indicative  of the presence of the identified virus, but do not rule out bacterial infection or co-infection with other pathogens not detected by the test.  Clinical correlation with patient history and  other diagnostic information is necessary to determine  patient infection status.  The expected result is negative.  Fact Sheet for Patients:   StrictlyIdeas.no   Fact Sheet for Healthcare Providers:   BankingDealers.co.za    This test is not yet approved or cleared by the [place] FDA and  has been authorized for detection and/or diagnosis of SARS-CoV-2 by FDA under an Emergency Use Authorization (EUA).  This EUA will remain in effect (meaning this tes t can be used) for the duration of  the COVID-19 declaration under Section 564(b)(1) of the Act, 21 U.S.C. section 360-bbb-3(b)(1), unless the authorization is terminated or revoked sooner.  Performed at [facility], [address] [place], [place] [address]   Culture, blood (single)     Status: None   Collection Time: 01/12/21  1:08 PM   Specimen: BLOOD  Result Value Ref Range Status   Specimen Description BLOOD BLOOD RIGHT HAND  Final   Special Requests   Final    BOTTLES DRAWN AEROBIC AND ANAEROBIC Blood Culture adequate volume   Culture   Final    NO GROWTH 5 DAYS Performed at [facility], [address] [place], [place] [address]    Report Status 01/17/2021 FINAL  Final  MRSA PCR Screening     Status: None   Collection Time: 01/12/21  7:22 PM   Specimen: Nasopharyngeal  Result Value Ref Range Status   MRSA by PCR NEGATIVE NEGATIVE Final    Comment:        The GeneXpert MRSA Assay (FDA approved for NASAL specimens only), is one component of a comprehensive MRSA colonization surveillance program. It is not intended to diagnose MRSA infection nor to guide or monitor treatment for MRSA infections. Performed at [facility], [address] [place], [place] [address]   Culture, respiratory (non-expectorated)     Status: None   Collection Time: 01/13/21  3:04 PM   Specimen: Tracheal Aspirate; Respiratory  Result Value Ref Range Status   Specimen Description   Final    TRACHEAL  ASPIRATE Performed at [facility], [address] [place], [place] [address]    Special Requests   Final    NONE Performed at [facility], [address] [place],  [address]    Gram Stain   Final    ABUNDANT WBC PRESENT, PREDOMINANTLY PMN MODERATE GRAM POSITIVE COCCI  Performed at [facility], [place] [address] [place], [place] [address]    Culture FEW STAPHYLOCOCCUS AUREUS  Final   Report Status 01/16/2021 FINAL  Final   Organism ID, Bacteria STAPHYLOCOCCUS AUREUS  Final      Susceptibility   Staphylococcus aureus - MIC*    CIPROFLOXACIN <=0.5 SENSITIVE Sensitive     ERYTHROMYCIN <=0.25 SENSITIVE Sensitive     GENTAMICIN <=0.5 SENSITIVE Sensitive     OXACILLIN 0.5 SENSITIVE Sensitive     TETRACYCLINE <=1 SENSITIVE Sensitive     VANCOMYCIN 1 SENSITIVE Sensitive     TRIMETH/SULFA <=10 SENSITIVE Sensitive     CLINDAMYCIN <=0.25 SENSITIVE Sensitive     RIFAMPIN <=0.5 SENSITIVE Sensitive     Inducible Clindamycin NEGATIVE Sensitive     * FEW STAPHYLOCOCCUS AUREUS    Coagulation Studies: No results for input(s): LABPROT, INR in the last 72 hours.  Urinalysis: No results for input(s): COLORURINE, LABSPEC, PHURINE, GLUCOSEU, HGBUR, BILIRUBINUR, KETONESUR, PROTEINUR, UROBILINOGEN, NITRITE, LEUKOCYTESUR in the last 72 hours.  Invalid input(s): APPERANCEUR    Imaging: No results found.   Medications:   . sodium chloride Stopped (01/17/21 1356)  . dexmedetomidine (PRECEDEX) IV infusion 0.5 mcg/kg/hr (01/17/21 1400)  . dextrose    . doxycycline (VIBRAMYCIN) IV 125 mL/hr at 01/17/21 1400  . famotidine (PEPCID) IV Stopped (01/17/21 0933)  . fentaNYL infusion INTRAVENOUS 350 mcg/hr (01/17/21 1400)  . midazolam 1 mg/hr (01/17/21 1400)  . norepinephrine (LEVOPHED) Adult infusion 1 mcg/min (01/17/21 1400)  . prismasol BGK 2/2.5 dialysis solution 1,000 mL/hr at 01/17/21 0911  . prismasol BGK 2/2.5 replacement solution 200 mL/hr at 01/17/21 0912   . prismasol BGK 2/2.5 replacement solution 200 mL/hr at 01/17/21 1414   . chlorhexidine gluconate (MEDLINE KIT)  15 mL Mouth Rinse BID  . Chlorhexidine Gluconate Cloth  6 each Topical Daily  . docusate  100 mg Per Tube BID  . feeding supplement (PROSource TF)  90 mL Per Tube QID  . feeding supplement (VITAL HIGH PROTEIN)  1,000 mL Per Tube Q24H  . heparin  5,000 Units Subcutaneous Q8H  . insulin aspart  0-20 Units Subcutaneous Q4H  . insulin aspart  3 Units Subcutaneous Q4H  . insulin detemir  35 Units Subcutaneous Daily  . mouth rinse  15 mL Mouth Rinse 10 times per day  . methylPREDNISolone (SOLU-MEDROL) injection  40 mg Intravenous Q12H  . multivitamin  15 mL Per Tube Daily  . polyethylene glycol  17 g Per Tube Daily  . sodium chloride flush  3 mL Intravenous Q12H   sodium chloride, acetaminophen (TYLENOL) oral liquid 160 mg/5 mL, dextrose, docusate sodium, fentaNYL, heparin, heparin, ibuprofen, midazolam, midazolam, ondansetron **OR** ondansetron (ZOFRAN) IV, ondansetron (ZOFRAN) IV, polyethylene glycol, sodium chloride, sodium chloride flush  Assessment/ Plan:  30 y.o. male with h/o Asthma, no recent medical care , was  admitted on 01/12/2021 for ARDS (adult respiratory distress syndrome) ([place]) [J80] Acute respiratory failure with hypoxia ([place]) [J96.01] Acute hypoxemic respiratory failure due to COVID-19 ([facility]) [U07.1, J96.01] Hyperosmolar hyperglycemic state (HHS) ([place]) [E11.00, E11.65] Pneumonia due to COVID-19 virus [U07.1, J12.82]  # AKI Baseline creatinine is unknown U/a- 6-10 RBCs, 11-20 WBCs, UPC of 0.24 US renal - unremarkable, no obstruction 02/05 0701 - 02/06 0700 In: 3805.3 [I.V.:1765.5; NG/GT:1040; IV Piggyback:999.8] Out: 7106 [Urine:310; Stool:575] - Continues to have oliguric acute renal failure, maintain the pt on CRRT at this time.    # hypernatremia -Resolved, serum Na now 137.    # New Dx  of Diabetes, type 1 uncontrolled Lab Results  Component Value  Date   HGBA1C 13.6 (H) 01/12/2021  Markedly elevated hemoglobin A1c as documented above.  Insulin management as per pulmonary/critical care.  # Acute respiratory failure # Covid 19-infection Continue vent support at this time, down to 24% fio2 on the vent.      LOS: 5 Ava Deguire 2/6/20222:19 PM  [place], [place]  Note: This note was prepared with Dragon dictation. Any transcription errors are unintentional

## 2021-01-17 NOTE — Progress Notes (Addendum)
Patient is intubated, sedated and continues to be on CRRT.  CRRT did run well until 2120 and then began to alarm, air found in blood.  Oxiris filter changed once this shift, due to "air in the blood"; the machine would not run or allow blood to be returned to patient. Filter replaced and CRRT resumed at 2310 with no further issues noted this shift. Patient continues to require Levophed, currently infusing at 36mcg/min.

## 2021-01-17 NOTE — Progress Notes (Signed)
Inpatient Diabetes Program Recommendations  AACE/ADA: New Consensus Statement on Inpatient Glycemic Control (2015)  Target Ranges:  Prepandial:   less than 140 mg/dL      Peak postprandial:   less than 180 mg/dL (1-2 hours)      Critically ill patients:  140 - 180 mg/dL   Lab Results  Component Value Date   GLUCAP 222 (H) 01/17/2021   HGBA1C 13.6 (H) 01/12/2021    Review of Glycemic Control  Diabetes history: New-onset DM Outpatient Diabetes medications: N/A Current orders for Inpatient glycemic control: Levemir 35 units QD, Novolog 0-20 units Q4H + 3 units Q4H  HgbA1C - 13.6% Needs increase in TF coverage  Inpatient Diabetes Program Recommendations:     Increase Novolog to 5 units Q4H for TF coverage.  Continue to follow. Will speak with pt regarding new diagnosis when appropriate.  Thank you. Ailene Ards, RD, LDN, CDE Inpatient Diabetes Coordinator [phone]

## 2021-01-17 NOTE — Progress Notes (Signed)
PHARMACY CONSULT NOTE   Pharmacy Consult for Electrolyte Monitoring and Replacement   Recent Labs: Potassium (mmol/L)  Date Value  01/17/2021 3.8   Magnesium (mg/dL)  Date Value  76/16/0737 2.4   Calcium (mg/dL)  Date Value  10/62/6948 8.2 (L)   Albumin (g/dL)  Date Value  54/62/7035 1.7 (L)   Phosphorus (mg/dL)  Date Value  00/93/8182 5.9 (H)   Sodium (mmol/L)  Date Value  01/17/2021 137    Assessment: 30 year old male admitted with COVID-19 pneumonia and DKA. Started on an insulin drip. Patient with hypoxic respiratory failure requiring intubation 2/1. Patient hypotensive and requiring blood pressure support with Levophed. Patient remains intubated sedated and on mechanical ventilation in the ICU. Pharmacy to manage electrolytes.  Nutrition: Tube feeds + free water flushes 200 mL q2h (2.4 L/day)  MIVF: D5LR at 125 mL/hr switched to D10 @40  mL/hr.  Patient with worsening AKI since admission despite fluid resuscitation. Nephrology following. Starting CRRT 2/3. Scr starting to trend down.   Goal of Therapy:  Electrolytes WNL  Plan:  Patient remains on CRRT. Insulin infusion d/c'ed and switched over to Sq insulin. Labs currently ordered BID per CRRT. Do not see need for checking more frequently at this time. No replacement needed at this time.  Continue to follow along.  , PharmD, BCPS 01/17/2021 7:32 AM

## 2021-01-17 NOTE — Progress Notes (Signed)
CRITICAL CARE NOTE 30 yo morbidly obese male with ASTHMA now with progressive multiorgan failure with COVID 19 pneumonia and ARDS with severe acidosis and severe hypoxia with ARDS with acute renal failure  2/1 Emergently intubated 2/2 severe resp failure, vasc cath placed 2/3 severe resp failure 01/15/2021- patient is critically Ill, CRRT continued.  Borderline low blood glucose today.   01/16/2021- patient with thickened heavy ETT secretions.  RN reporting inadequate sedation with patient making motions to remove airway, we have advanced RASS to range -3 to -4.  Remains critically ill with ongoing renal replacement therapy and COVID treatment.   01/17/2021- patient had thickened secretions again today, he was lavaged with saline by RT today with good response. We will try awakening trial again in AM.   CC    SUBJECTIVE Patient remains critically ill Prognosis is guarded  Vent Mode: PRVC FiO2 (%):  [24 %] 24 % Set Rate:  [25 bmp] 25 bmp Vt Set:  [550 mL] 550 mL PEEP:  [5 cmH20] 5 cmH20 Plateau Pressure:  [18 cmH20-20 cmH20] 18 cmH20 CBC    Component Value Date/Time   WBC 10.4 01/17/2021 0433   RBC 3.76 (L) 01/17/2021 0433   HGB 9.9 (L) 01/17/2021 0433   HCT 30.6 (L) 01/17/2021 0433   PLT 111 (L) 01/17/2021 0433   MCV 81.4 01/17/2021 0433   MCH 26.3 01/17/2021 0433   MCHC 32.4 01/17/2021 0433   RDW 14.6 01/17/2021 0433   LYMPHSABS 1.5 01/17/2021 0433   MONOABS 1.0 01/17/2021 0433   EOSABS 0.0 01/17/2021 0433   BASOSABS 0.0 01/17/2021 0433   BMP Latest Ref Rng & Units 01/17/2021 01/16/2021 01/16/2021  Glucose 70 - 99 mg/dL 294(T) 654(Y) 503(T)  BUN 6 - 20 mg/dL 46(F) 68(L) 27(N)  Creatinine 0.61 - 1.24 mg/dL 1.70(Y) 1.74(B) 4.49(Q)  Sodium 135 - 145 mmol/L 137 136 139  Potassium 3.5 - 5.1 mmol/L 3.8 4.2 4.4  Chloride 98 - 111 mmol/L 102 105 105  CO2 22 - 32 mmol/L 23 21(L) 22  Calcium 8.9 - 10.3 mg/dL 8.2(L) 7.8(L) 7.8(L)    BP (!) 107/56   Pulse 65   Temp 98.6 F (37 C)  (Bladder)   Resp (!) 25   Ht 6' (1.829 m)   Wt (!) 195.5 kg   SpO2 95%   BMI 58.45 kg/m    I/O last 3 completed shifts: In: 5316.1 [I.V.:2796.3; NG/GT:1520; IV Piggyback:999.8] Out: 6275 [Urine:350; Other:5050; Stool:875] Total I/O In: 522.3 [I.V.:282.3; NG/GT:190; IV Piggyback:50] Out: 472 [Urine:52; Other:420]  SpO2: 95 % O2 Flow Rate (L/min): 7 L/min FiO2 (%): 24 %  Estimated body mass index is 58.45 kg/m as calculated from the following:   Height as of this encounter: 6' (1.829 m).   Weight as of this encounter: 195.5 kg.  SIGNIFICANT EVENTS   REVIEW OF SYSTEMS  PATIENT IS UNABLE TO PROVIDE COMPLETE REVIEW OF SYSTEMS DUE TO SEVERE CRITICAL ILLNESS       PHYSICAL EXAMINATION: GENERAL:obee age appropriate in NAD sedated on MV NECK: Supple. +ETT PULMONARY: +rhonchi with mV sounds CARDIOVASCULAR: S1 and S2.  GASTROINTESTINAL: Soft, nontender, +Positive bowel sounds.  MUSCULOSKELETAL: No swelling, clubbing, or edema. +HD-access  NEUROLOGIC:  GCS4t on MV SKIN:grossly warm, edematous     MEDICATIONS: I have reviewed all medications and confirmed regimen as documented   CULTURE RESULTS   Recent Results (from the past 240 hour(s))  Blood culture (routine single)     Status: None   Collection Time: 01/12/21  9:05 AM  Specimen: BLOOD  Result Value Ref Range Status   Specimen Description BLOOD LEFT AC  Final   Special Requests   Final    BOTTLES DRAWN AEROBIC ONLY Blood Culture results may not be optimal due to an inadequate volume of blood received in culture bottles   Culture   Final    NO GROWTH 5 DAYS Performed at [facility], [address] [place], [place] [address]    Report Status 01/17/2021 FINAL  Final  SARS Coronavirus 2 by RT PCR (hospital order, performed in [facility] hospital lab) Nasopharyngeal Nasopharyngeal Swab     Status: Abnormal   Collection Time: 01/12/21  9:36 AM   Specimen: Nasopharyngeal Swab  Result Value Ref Range  Status   SARS Coronavirus 2 POSITIVE (A) NEGATIVE Final    Comment: RESULT CALLED TO, READ BACK BY AND VERIFIED WITH: ANNA JASPER ON 01/12/21 AT 1111 SDR (NOTE) SARS-CoV-2 target nucleic acids are DETECTED  SARS-CoV-2 RNA is generally detectable in upper respiratory specimens  during the acute phase of infection.  Positive results are indicative  of the presence of the identified virus, but do not rule out bacterial infection or co-infection with other pathogens not detected by the test.  Clinical correlation with patient history and  other diagnostic information is necessary to determine patient infection status.  The expected result is negative.  Fact Sheet for Patients:   BoilerBrush.com.cy   Fact Sheet for Healthcare Providers:   https://pope.com/    This test is not yet approved or cleared by the [place] FDA and  has been authorized for detection and/or diagnosis of SARS-CoV-2 by FDA under an Emergency Use Authorization (EUA).  This EUA will remain in effect (meaning this tes t can be used) for the duration of  the COVID-19 declaration under Section 564(b)(1) of the Act, 21 U.S.C. section 360-bbb-3(b)(1), unless the authorization is terminated or revoked sooner.  Performed at [facility], [address] [place], [place] [address]   Culture, blood (single)     Status: None   Collection Time: 01/12/21  1:08 PM   Specimen: BLOOD  Result Value Ref Range Status   Specimen Description BLOOD BLOOD RIGHT HAND  Final   Special Requests   Final    BOTTLES DRAWN AEROBIC AND ANAEROBIC Blood Culture adequate volume   Culture   Final    NO GROWTH 5 DAYS Performed at [facility], [address] [place], [place] [address]    Report Status 01/17/2021 FINAL  Final  MRSA PCR Screening     Status: None   Collection Time: 01/12/21  7:22 PM   Specimen: Nasopharyngeal  Result Value Ref Range Status   MRSA by PCR  NEGATIVE NEGATIVE Final    Comment:        The GeneXpert MRSA Assay (FDA approved for NASAL specimens only), is one component of a comprehensive MRSA colonization surveillance program. It is not intended to diagnose MRSA infection nor to guide or monitor treatment for MRSA infections. Performed at [facility], [address] [place], [place] [address]   Culture, respiratory (non-expectorated)     Status: None   Collection Time: 01/13/21  3:04 PM   Specimen: Tracheal Aspirate; Respiratory  Result Value Ref Range Status   Specimen Description   Final    TRACHEAL ASPIRATE Performed at [facility], [address], [place], [place] [address]    Special Requests   Final    NONE Performed at [facility], [address], [place], [place] [address]    Gram Stain   Final    ABUNDANT WBC PRESENT, PREDOMINANTLY PMN MODERATE GRAM POSITIVE COCCI Performed at [facility], [address] [place], [place] [address]    Culture FEW STAPHYLOCOCCUS AUREUS  Final   Report Status 01/16/2021 FINAL  Final   Organism ID, Bacteria STAPHYLOCOCCUS AUREUS  Final      Susceptibility   Staphylococcus aureus - MIC*    CIPROFLOXACIN <=0.5 SENSITIVE Sensitive     ERYTHROMYCIN <=0.25 SENSITIVE Sensitive     GENTAMICIN <=0.5 SENSITIVE Sensitive     OXACILLIN 0.5 SENSITIVE Sensitive     TETRACYCLINE <=1 SENSITIVE Sensitive     VANCOMYCIN 1 SENSITIVE Sensitive     TRIMETH/SULFA <=10 SENSITIVE Sensitive     CLINDAMYCIN <=0.25 SENSITIVE Sensitive     RIFAMPIN <=0.5 SENSITIVE Sensitive     Inducible Clindamycin NEGATIVE Sensitive     * FEW STAPHYLOCOCCUS AUREUS          IMAGING    No results found.   Nutrition Status: Nutrition Problem: Inadequate oral intake Etiology: inability to eat Signs/Symptoms: NPO status Interventions: Tube feeding,Prostat     Indwelling Urinary Catheter continued, requirement due to   Reason to continue Indwelling Urinary  Catheter strict Intake/Output monitoring for hemodynamic instability   Central Line/ continued, requirement due to  Reason to continue [employer] Monitoring of central venous pressure or other hemodynamic parameters and poor IV access   Ventilator continued, requirement due to severe respiratory failure   Ventilator Sedation RASS 0 to -2      ASSESSMENT AND PLAN SYNOPSIS 30 yo morbidly obese male with ASTHMA now with progressive multiorgan failure with COVID 19 pneumonia and ARDS with severe acidosis and severe hypoxia with ARDS with acute renal failure    Acute hypoxemic respiratory failure due to COVID-19 pneumonia/ARDS Mechanical ventilation via ARDS protocol, target PRVC 6 cc/kg Wean PEEP and FiO2 as able Goal plateau pressure less than 30, driving pressure less than 15 Paralytics if necessary for vent synchrony, gas exchange -difficulty with prone positioning due to CRRT , and morbid body habitus Deep sedation per PAD protocol VAP prevention order set Remdesivir Therapy IV STEROIDS Therapy Follow inflammatory markers as needed with CRP Vitamin C, zinc Plan to repeat and check resp cultures as needed    -Minimal settings on PRVC - FiO2 down to 24%, heavy thickened secretions today. Tracheal aspirate ordered today. Will continue with negative fluid balance via CRRT -continue Full MV support -continue Bronchodilator Therapy -Wean Fio2 and PEEP as tolerated -VAP/VENT bundle implementation     Morbid obesity, possible OSA.   Will certainly impact respiratory mechanics, ventilator weaning Suspect will need to consider additional PEEP   ACUTE KIDNEY INJURY/Renal Failure-stage 5 -continue Foley Catheter-assess need -Avoid nephrotoxic agents -Follow urine output, BMP -Ensure adequate renal perfusion, optimize oxygenation -Renal dose medications           -CRRT per nephro    NEUROLOGY Acute toxic metabolic encephalopathy, need for sedation Goal RASS - -3 to  -4  SEPSIS due to staph aureus pneumonia        - present on admission - due to post Viral pneumonia with COVID19 -use vasopressors to keep MAP>65 as needed -follow ABG and LA -follow up cultures -emperic ABX-doxy bid IV 100           MRSA PCR ordered  CARDIAC ICU monitoring  ID -continue IV abx as prescibed -follow up cultures  GI GI PROPHYLAXIS as indicated   DIET-->TF's as tolerated Constipation protocol as indicated  ENDO - will use ICU hypoglycemic\Hyperglycemia protocol if indicated    ELECTROLYTES -follow labs as needed -replace as needed -pharmacy consultation and following   DVT/GI PRX ordered and assessed TRANSFUSIONS AS NEEDED MONITOR FSBS I Assessed the need for Labs I Assessed the need for Foley I Assessed the need for Central Venous Line Family Discussion when available I Assessed the need for Mobilization I made an Assessment of medications to be adjusted accordingly Safety Risk assessment completed   CASE DISCUSSED IN MULTIDISCIPLINARY ROUNDS WITH ICU TEAM  Critical Care Time devoted to patient care services described in this note is 33 minutes.   Overall, patient is critically ill, prognosis is guarded.  Patient with Multiorgan failure and at high risk for cardiac arrest and death.     Vida Rigger, M.D.  Pulmonary & Critical Care Medicine  [facility]

## 2021-01-18 LAB — CBC
HCT: 32.2 % — ABNORMAL LOW (ref 39.0–52.0)
Hemoglobin: 10 g/dL — ABNORMAL LOW (ref 13.0–17.0)
MCH: 25.6 pg — ABNORMAL LOW (ref 26.0–34.0)
MCHC: 31.1 g/dL (ref 30.0–36.0)
MCV: 82.6 fL (ref 80.0–100.0)
Platelets: 123 10*3/uL — ABNORMAL LOW (ref 150–400)
RBC: 3.9 MIL/uL — ABNORMAL LOW (ref 4.22–5.81)
RDW: 14.4 % (ref 11.5–15.5)
WBC: 16.1 10*3/uL — ABNORMAL HIGH (ref 4.0–10.5)
nRBC: 0.6 % — ABNORMAL HIGH (ref 0.0–0.2)

## 2021-01-18 LAB — RENAL FUNCTION PANEL
Albumin: 2 g/dL — ABNORMAL LOW (ref 3.5–5.0)
Albumin: 2 g/dL — ABNORMAL LOW (ref 3.5–5.0)
Anion gap: 10 (ref 5–15)
Anion gap: 9 (ref 5–15)
BUN: 66 mg/dL — ABNORMAL HIGH (ref 6–20)
BUN: 68 mg/dL — ABNORMAL HIGH (ref 6–20)
CO2: 23 mmol/L (ref 22–32)
CO2: 24 mmol/L (ref 22–32)
Calcium: 8.3 mg/dL — ABNORMAL LOW (ref 8.9–10.3)
Calcium: 8.5 mg/dL — ABNORMAL LOW (ref 8.9–10.3)
Chloride: 102 mmol/L (ref 98–111)
Chloride: 102 mmol/L (ref 98–111)
Creatinine, Ser: 4.24 mg/dL — ABNORMAL HIGH (ref 0.61–1.24)
Creatinine, Ser: 3.94 mg/dL — ABNORMAL HIGH (ref 0.61–1.24)
GFR, Estimated: 18 mL/min — ABNORMAL LOW (ref >60)
GFR, Estimated: 20 mL/min — ABNORMAL LOW (ref >60)
Glucose, Bld: 269 mg/dL — ABNORMAL HIGH (ref 70–99)
Glucose, Bld: 289 mg/dL — ABNORMAL HIGH (ref 70–99)
Phosphorus: 5.7 mg/dL — ABNORMAL HIGH (ref 2.5–4.6)
Phosphorus: 5.9 mg/dL — ABNORMAL HIGH (ref 2.5–4.6)
Potassium: 3.6 mmol/L (ref 3.5–5.1)
Potassium: 3.7 mmol/L (ref 3.5–5.1)
Sodium: 135 mmol/L (ref 135–145)
Sodium: 135 mmol/L (ref 135–145)

## 2021-01-18 LAB — GLUCOSE, CAPILLARY
Glucose-Capillary: 253 mg/dL — ABNORMAL HIGH (ref 70–99)
Glucose-Capillary: 213 mg/dL — ABNORMAL HIGH (ref 70–99)
Glucose-Capillary: 163 mg/dL — ABNORMAL HIGH (ref 70–99)
Glucose-Capillary: 253 mg/dL — ABNORMAL HIGH (ref 70–99)
Glucose-Capillary: 250 mg/dL — ABNORMAL HIGH (ref 70–99)
Glucose-Capillary: 237 mg/dL — ABNORMAL HIGH (ref 70–99)
Glucose-Capillary: 197 mg/dL — ABNORMAL HIGH (ref 70–99)

## 2021-01-18 LAB — MAGNESIUM: Magnesium: 2 mg/dL (ref 1.7–2.4)

## 2021-01-18 MED ORDER — CHLORHEXIDINE GLUCONATE 0.12 % MT SOLN
OROMUCOSAL | Status: AC
  Administered 2021-01-18: 15 mL via OROMUCOSAL
  Filled 2021-01-18: qty 15

## 2021-01-18 MED ORDER — INSULIN ASPART 100 UNIT/ML ~~LOC~~ SOLN
5.0000 [IU] | SUBCUTANEOUS | Status: DC
  Administered 2021-01-18 – 2021-01-25 (×17): 5 [IU] via SUBCUTANEOUS
  Filled 2021-01-18 (×18): qty 1

## 2021-01-18 MED ORDER — INSULIN DETEMIR 100 UNIT/ML ~~LOC~~ SOLN
40.0000 [IU] | Freq: Every day | SUBCUTANEOUS | Status: DC
  Administered 2021-01-18 – 2021-01-22 (×5): 40 [IU] via SUBCUTANEOUS
  Filled 2021-01-18 (×6): qty 0.4

## 2021-01-18 MED ORDER — METHYLPREDNISOLONE SODIUM SUCC 40 MG IJ SOLR
40.0000 mg | INTRAMUSCULAR | Status: DC
  Administered 2021-01-18 – 2021-01-24 (×7): 40 mg via INTRAVENOUS
  Filled 2021-01-18 (×8): qty 1

## 2021-01-18 MED ORDER — INSULIN DETEMIR 100 UNIT/ML ~~LOC~~ SOLN
20.0000 [IU] | Freq: Every day | SUBCUTANEOUS | Status: DC
  Administered 2021-01-18 – 2021-01-21 (×4): 20 [IU] via SUBCUTANEOUS
  Filled 2021-01-18 (×5): qty 0.2

## 2021-01-18 MED ORDER — RENA-VITE PO TABS
1.0000 | ORAL_TABLET | Freq: Every day | ORAL | Status: DC
  Administered 2021-01-18 – 2021-01-20 (×3): 1
  Filled 2021-01-18 (×4): qty 1

## 2021-01-18 NOTE — Progress Notes (Signed)
Patient was in spontaneous trail for approximately 1 hr- 10/5 -tube feeds held during trial- patient vomited a large amount of emesis 3 times- brown in color- zofran given-vitals were stable- patient breathing 29/30 times a minute- Dr. Jayme Cloud notified and was ordered to place patient back on previous rate. Continuing to hold tube feeds at this time.

## 2021-01-18 NOTE — Progress Notes (Signed)
[place], [place] 01/18/21  Subjective:   Hospital day # 6 Patient continues to have minimal urine output. Remains on CRRT. Still on the ventilator for COVID-19 pneumonia.  Renal:  02/06 0701 - 02/07 0700 In: 3602.7 [I.V.:1715.9; NG/GT:1200.5; IV Piggyback:550.2] Out: 7262 [Urine:274; Stool:260] Lab Results  Component Value Date   CREATININE 4.24 (H) 01/18/2021   CREATININE 4.35 (H) 01/17/2021   CREATININE 4.88 (H) 01/17/2021      Objective:  Vital signs in last 24 hours:  Temp:  [98.3 F (36.8 C)-99.1 F (37.3 C)] 98.6 F (37 C) (02/07 1200) Pulse Rate:  [63-135] 72 (02/07 1400) Resp:  [20-26] 20 (02/07 1400) BP: (85-135)/(50-81) 115/71 (02/07 1400) SpO2:  [91 %-100 %] 99 % (02/07 1400) FiO2 (%):  [24 %] 24 % (02/07 1058) Weight:  [195 kg] 195 kg (02/07 0345)  Weight change: -0.454 kg Filed Weights   01/15/21 0350 01/17/21 0500 01/18/21 0345  Weight: (!) 174.2 kg (!) 195.5 kg (!) 195 kg    Intake/Output:    Intake/Output Summary (Last 24 hours) at 01/18/2021 1422 Last data filed at 01/18/2021 1400 Gross per 24 hour  Intake 3348.18 ml  Output 3652 ml  Net -303.82 ml    Physical Exam: General: Critically ill-appearing  HEENT ETT in place  Lungs: Vent assisted  Heart:: Regular  Abdomen: Mild distension  Extremities: no edema  Neurologic: sedated  Skin: No acute rash  Access: left IJ temp cath  Foley: Present      Basic Metabolic Panel:  Recent Labs  Lab 01/13/21 0319 01/13/21 0851 01/14/21 0230 01/14/21 0931 01/15/21 0402 01/15/21 1549 01/16/21 0419 01/16/21 1621 01/17/21 0433 01/17/21 1536 01/18/21 0346  NA 158*   < > 155*   < > 144   < > 139 136 137 136 135  K 4.7   < > 4.8   < > 4.5   < > 4.4 4.2 3.8 3.7 3.6  CL 117*   < > 119*   < > 113*   < > 105 105 102 104 102  CO2 25   < > 23   < > 23   < > 22 21* 23 21* 23  GLUCOSE 253*   < > 134*   < > 213*   < > 256* 304* 245* 249* 269*  BUN 73*   < > 75*   < >  80*   < > 74* 71* 64* 65* 66*  CREATININE 3.66*   < > 5.86*   < > 6.56*   < > 5.83* 5.15* 4.88* 4.35* 4.24*  CALCIUM 8.3*   < > 8.1*   < > 7.5*   < > 7.8* 7.8* 8.2* 8.0* 8.3*  MG 3.9*  --  3.5*  --  2.8*  --  2.4  --   --   --  2.0  PHOS 5.6*  --  5.7*   < > 7.8*   < > 7.4* 6.7* 5.9* 4.9* 5.7*   < > = values in this interval not displayed.     CBC: Recent Labs  Lab 01/13/21 0319 01/14/21 0230 01/15/21 0402 01/16/21 0419 01/17/21 0433 01/18/21 0346  WBC 15.0* 16.0* 15.0* 14.3* 10.4 16.1*  NEUTROABS 10.7* 12.8* 12.1* 10.7* 7.2  --   HGB 15.3 12.5* 11.5* 10.6* 9.9* 10.0*  HCT 51.1 43.9 39.3 35.0* 30.6* 32.2*  MCV 86.8 89.8 87.9 85.6 81.4 82.6  PLT 403* 274 205 143* 111* 123*     No  results found for: HEPBSAG, HEPBSAB, [place]    Microbiology:  Recent Results (from the past 240 hour(s))  Blood culture (routine single)     Status: None   Collection Time: 01/12/21  9:05 AM   Specimen: BLOOD  Result Value Ref Range Status   Specimen Description BLOOD LEFT AC  Final   Special Requests   Final    BOTTLES DRAWN AEROBIC ONLY Blood Culture results may not be optimal due to an inadequate volume of blood received in culture bottles   Culture   Final    NO GROWTH 5 DAYS Performed at [facility], [address] [place], [place] [address]    Report Status 01/17/2021 FINAL  Final  SARS Coronavirus 2 by RT PCR (hospital order, performed in [place] hospital lab) Nasopharyngeal Nasopharyngeal Swab     Status: Abnormal   Collection Time: 01/12/21  9:36 AM   Specimen: Nasopharyngeal Swab  Result Value Ref Range Status   SARS Coronavirus 2 POSITIVE (A) NEGATIVE Final    Comment: RESULT CALLED TO, READ BACK BY AND VERIFIED WITH: ANNA JASPER ON 01/12/21 AT 1111 SDR (NOTE) SARS-CoV-2 target nucleic acids are DETECTED  SARS-CoV-2 RNA is generally detectable in upper respiratory specimens  during the acute phase of infection.  Positive results are indicative  of the presence  of the identified virus, but do not rule out bacterial infection or co-infection with other pathogens not detected by the test.  Clinical correlation with patient history and  other diagnostic information is necessary to determine patient infection status.  The expected result is negative.  Fact Sheet for Patients:   StrictlyIdeas.no   Fact Sheet for Healthcare Providers:   BankingDealers.co.za    This test is not yet approved or cleared by the [place] FDA and  has been authorized for detection and/or diagnosis of SARS-CoV-2 by FDA under an Emergency Use Authorization (EUA).  This EUA will remain in effect (meaning this tes t can be used) for the duration of  the COVID-19 declaration under Section 564(b)(1) of the Act, 21 U.S.C. section 360-bbb-3(b)(1), unless the authorization is terminated or revoked sooner.  Performed at [facility], [address] [place], [place] [address]   Culture, blood (single)     Status: None   Collection Time: 01/12/21  1:08 PM   Specimen: BLOOD  Result Value Ref Range Status   Specimen Description BLOOD BLOOD RIGHT HAND  Final   Special Requests   Final    BOTTLES DRAWN AEROBIC AND ANAEROBIC Blood Culture adequate volume   Culture   Final    NO GROWTH 5 DAYS Performed at [facility], [address] [place], [place] [address]    Report Status 01/17/2021 FINAL  Final  MRSA PCR Screening     Status: None   Collection Time: 01/12/21  7:22 PM   Specimen: Nasopharyngeal  Result Value Ref Range Status   MRSA by PCR NEGATIVE NEGATIVE Final    Comment:        The GeneXpert MRSA Assay (FDA approved for NASAL specimens only), is one component of a comprehensive MRSA colonization surveillance program. It is not intended to diagnose MRSA infection nor to guide or monitor treatment for MRSA infections. Performed at [facility], [address] [place], [place]  [address]   Culture, respiratory (non-expectorated)     Status: None   Collection Time: 01/13/21  3:04 PM   Specimen: Tracheal Aspirate; Respiratory  Result Value Ref Range Status  Specimen Description   Final    TRACHEAL ASPIRATE Performed at [facility], [address], [place], [place] [address]    Special Requests   Final    NONE Performed at [facility], [place], [place] [address]    Gram Stain   Final    ABUNDANT WBC PRESENT, PREDOMINANTLY PMN MODERATE GRAM POSITIVE COCCI Performed at [facility], [place] [address] [place], [place] [address]    Culture FEW STAPHYLOCOCCUS AUREUS  Final   Report Status 01/16/2021 FINAL  Final   Organism ID, Bacteria STAPHYLOCOCCUS AUREUS  Final      Susceptibility   Staphylococcus aureus - MIC*    CIPROFLOXACIN <=0.5 SENSITIVE Sensitive     ERYTHROMYCIN <=0.25 SENSITIVE Sensitive     GENTAMICIN <=0.5 SENSITIVE Sensitive     OXACILLIN 0.5 SENSITIVE Sensitive     TETRACYCLINE <=1 SENSITIVE Sensitive     VANCOMYCIN 1 SENSITIVE Sensitive     TRIMETH/SULFA <=10 SENSITIVE Sensitive     CLINDAMYCIN <=0.25 SENSITIVE Sensitive     RIFAMPIN <=0.5 SENSITIVE Sensitive     Inducible Clindamycin NEGATIVE Sensitive     * FEW STAPHYLOCOCCUS AUREUS    Coagulation Studies: No results for input(s): LABPROT, INR in the last 72 hours.  Urinalysis: No results for input(s): COLORURINE, LABSPEC, PHURINE, GLUCOSEU, HGBUR, BILIRUBINUR, KETONESUR, PROTEINUR, UROBILINOGEN, NITRITE, LEUKOCYTESUR in the last 72 hours.  Invalid input(s): APPERANCEUR    Imaging: No results found.   Medications:   . sodium chloride 5 mL/hr at 01/18/21 0700  . dexmedetomidine (PRECEDEX) IV infusion 1 mcg/kg/hr (01/18/21 1408)  . dextrose    . doxycycline (VIBRAMYCIN) IV 100 mg (01/18/21 1409)  . famotidine (PEPCID) IV 20 mg (01/18/21 0914)  . fentaNYL infusion INTRAVENOUS 350 mcg/hr (01/18/21 1223)  . midazolam 1 mg/hr (01/18/21  0700)  . norepinephrine (LEVOPHED) Adult infusion 2 mcg/min (01/18/21 0700)  . prismasol BGK 2/2.5 dialysis solution 1,000 mL/hr at 01/18/21 1224  . prismasol BGK 2/2.5 replacement solution 200 mL/hr at 01/18/21 1224  . prismasol BGK 2/2.5 replacement solution 200 mL/hr at 01/18/21 1224   . chlorhexidine gluconate (MEDLINE KIT)  15 mL Mouth Rinse BID  . Chlorhexidine Gluconate Cloth  6 each Topical Daily  . docusate  100 mg Per Tube BID  . feeding supplement (PROSource TF)  90 mL Per Tube QID  . feeding supplement (VITAL HIGH PROTEIN)  1,000 mL Per Tube Q24H  . heparin  5,000 Units Subcutaneous Q8H  . insulin aspart  0-20 Units Subcutaneous Q4H  . insulin aspart  5 Units Subcutaneous Q4H  . insulin detemir  20 Units Subcutaneous QHS  . insulin detemir  40 Units Subcutaneous Daily  . mouth rinse  15 mL Mouth Rinse 10 times per day  . methylPREDNISolone (SOLU-MEDROL) injection  40 mg Intravenous Q24H  . multivitamin  1 tablet Per Tube QHS  . multivitamin  15 mL Per Tube Daily  . polyethylene glycol  17 g Per Tube Daily  . sodium chloride flush  3 mL Intravenous Q12H   sodium chloride, acetaminophen (TYLENOL) oral liquid 160 mg/5 mL, dextrose, docusate sodium, fentaNYL, heparin, heparin, midazolam, midazolam, ondansetron **OR** ondansetron (ZOFRAN) IV, ondansetron (ZOFRAN) IV, polyethylene glycol, sodium chloride, sodium chloride flush  Assessment/ Plan:  30 y.o. male with h/o Asthma, no recent medical care , was  admitted on 01/12/2021 for ARDS (adult respiratory distress syndrome) ([place]) [J80] Acute respiratory failure with hypoxia ([place]) [J96.01] Acute hypoxemic respiratory failure due to COVID-19 (  [place]) [U07.1, J96.01] Hyperosmolar hyperglycemic state (HHS) ([place]) [E11.00, E11.65] Pneumonia due to COVID-19 virus [U07.1, J12.82]  # AKI Baseline creatinine is unknown U/a- 6-10 RBCs, 11-20 WBCs, UPC of 0.24 US renal - unremarkable, no obstruction 02/06 0701 - 02/07 0700 In: 3602.7  [I.V.:1715.9; NG/GT:1200.5; IV Piggyback:550.2] Out: 2902 [Urine:274; Stool:260] -Oliguric acute kidney injury persist.  Therefore we will maintain the patient on CRRT at this time.  Continue to monitor serum electrolytes closely.  # hypernatremia -Continue to periodically monitor serum sodium.   # New Dx of Diabetes, type 1 uncontrolled Lab Results  Component Value Date   HGBA1C 13.6 (H) 01/12/2021  Markedly elevated hemoglobin A1c as documented above.  Insulin management as per pulmonary/critical care.  # Acute respiratory failure # Covid 19-infection Minimal vent settings noted.  Hopefully can be weaned from the ventilator in the relative near future.     LOS: 6 Kerrigan Gombos 2/7/20222:22 PM  [place], [place]  Note: This note was prepared with Dragon dictation. Any transcription errors are unintentional

## 2021-01-18 NOTE — Progress Notes (Signed)
PHARMACY CONSULT NOTE   Pharmacy Consult for Electrolyte Monitoring and Replacement   Recent Labs: Potassium (mmol/L)  Date Value  01/18/2021 3.6   Magnesium (mg/dL)  Date Value  16/06/3709 2.0   Calcium (mg/dL)  Date Value  62/69/4854 8.3 (L)   Albumin (g/dL)  Date Value  62/70/3500 2.0 (L)   Phosphorus (mg/dL)  Date Value  93/81/8299 5.7 (H)   Sodium (mmol/L)  Date Value  01/18/2021 135    Assessment: 30 year old male admitted with COVID-19 pneumonia and DKA. Started on an insulin drip. Patient with hypoxic respiratory failure requiring intubation 2/1. Patient hypotensive and requiring blood pressure support with Levophed. Patient remains intubated sedated and on mechanical ventilation in the ICU. Pharmacy to manage electrolytes.  Nutrition: Tube feeds + free water flushes 200 mL q2h (2.4 L/day)  MIVF: D5LR at 125 mL/hr switched to D10 @40  mL/hr.  Patient with worsening AKI since admission despite fluid resuscitation. Nephrology following. Starting CRRT 2/3.  Goal of Therapy:  Electrolytes WNL  Plan:  Patient remains on CRRT. Insulin drip transitioned to subcu regimen. Labs currently ordered BID per CRRT. Do not see need for checking more frequently at this time. No replacement needed at this time.  Continue to follow along.  , PharmD 01/18/2021 12:32 PM

## 2021-01-18 NOTE — Progress Notes (Signed)
Inpatient Diabetes Program Recommendations  AACE/ADA: New Consensus Statement on Inpatient Glycemic Control   Target Ranges:  Prepandial:   less than 140 mg/dL      Peak postprandial:   less than 180 mg/dL (1-2 hours)      Critically ill patients:  140 - 180 mg/dL   Results for SHRAGA, CUSTARD (MRN [id number]) as of 01/18/2021 07:10  Ref. Range 01/17/2021 07:26 01/17/2021 11:26 01/17/2021 15:42 01/17/2021 19:50 01/17/2021 23:01 01/18/2021 03:16  Glucose-Capillary Latest Ref Range: 70 - 99 mg/dL 944 (H) 967 (H) 591 (H) 207 (H) 200 (H) 253 (H)   Review of Glycemic Control  Diabetes history: No Outpatient Diabetes medications: NA Current orders for Inpatient glycemic control: Levemir 35 units daily, Novolog 0-20 units Q4H, Novolog 5 units Q4H; Solumedrol 40 mg Q12H, Vital @ 40 ml/hr  Inpatient Diabetes Program Recommendations:    Insulin: If steroids and tube feedings are continued as ordered please consider changing Levemir to 40 units QAM (to start this morning), and add Levemir 20 units QHS.  IV insulin: If changes are made with insulin and glucose remains consistently over 200 mg/dl, please consider discontinuing all insulin orders and order ICU Glycemic Control phase 2 IV insulin to get glucose under control and determine insulin needs.  NOTE: Per chart, patient remains on ventilator, CRRT, steroids and tube feedings at this time.   Thanks, Orlando Penner, RN, MSN, CDE Diabetes Coordinator Inpatient Diabetes Program [phone] (Team Pager from 8am to 5pm)

## 2021-01-18 NOTE — Progress Notes (Signed)
Assisted tele visit to patient with mother.  Latondra Gebhart M Sevilla Murtagh, RN   

## 2021-01-18 NOTE — Progress Notes (Signed)
CRITICAL CARE NOTE 30 yo morbidly obese male with ASTHMA now with progressive multiorgan failure with COVID 19 pneumonia and ARDS with severe acidosis and severe hypoxia with ARDS with acute renal failure  2/1 Emergently intubated 2/2 severe resp failure, vasc cath placed 2/3 severe resp failure 01/15/2021- patient is critically Ill, CRRT continued.  Borderline low blood glucose today.   01/16/2021- patient with thickened heavy ETT secretions.  RN reporting inadequate sedation with patient making motions to remove airway, we have advanced RASS to range -3 to -4.  Remains critically ill with ongoing renal replacement therapy and COVID treatment.   01/17/2021- patient had thickened secretions again today, he was lavaged with saline by RT today with good response. We will try awakening trial again in AM.  01/18/2021-remains on vent, on CRRT 01/19/2021-   SUBJECTIVE Patient remains critically ill Prognosis is guarded  Vent Mode: PRVC FiO2 (%):  [24 %] 24 % Set Rate:  [25 bmp] 25 bmp Vt Set:  [550 mL] 550 mL PEEP:  [5 cmH20] 5 cmH20 Pressure Support:  [10 cmH20] 10 cmH20 Plateau Pressure:  [15 cmH20-22 cmH20] 15 cmH20 CBC    Component Value Date/Time   WBC 16.1 (H) 01/18/2021 0346   RBC 3.90 (L) 01/18/2021 0346   HGB 10.0 (L) 01/18/2021 0346   HCT 32.2 (L) 01/18/2021 0346   PLT 123 (L) 01/18/2021 0346   MCV 82.6 01/18/2021 0346   MCH 25.6 (L) 01/18/2021 0346   MCHC 31.1 01/18/2021 0346   RDW 14.4 01/18/2021 0346   LYMPHSABS 1.5 01/17/2021 0433   MONOABS 1.0 01/17/2021 0433   EOSABS 0.0 01/17/2021 0433   BASOSABS 0.0 01/17/2021 0433   BMP Latest Ref Rng & Units 01/18/2021 01/18/2021 01/17/2021  Glucose 70 - 99 mg/dL 289(H) 269(H) 249(H)  BUN 6 - 20 mg/dL 68(H) 66(H) 65(H)  Creatinine 0.61 - 1.24 mg/dL 3.94(H) 4.24(H) 4.35(H)  Sodium 135 - 145 mmol/L 135 135 136  Potassium 3.5 - 5.1 mmol/L 3.7 3.6 3.7  Chloride 98 - 111 mmol/L 102 102 104  CO2 22 - 32 mmol/L 24 23 21(L)  Calcium  8.9 - 10.3 mg/dL 8.5(L) 8.3(L) 8.0(L)     REVIEW OF SYSTEMS  Unable to obtain ROS, patient intubated and mechanically ventilated  OBJECTIVE:  BP 123/71 (BP Location: Left Wrist)   Pulse 63   Temp (!) 97.3 F (36.3 C) (Bladder) Comment: Patient is on cooling/warming blanket  Resp (!) 25   Ht 6' (1.829 m)   Wt (!) 195 kg   SpO2 99%   BMI 58.32 kg/m    I/O last 3 completed shifts: In: 4960.3 [I.V.:2574.1; Other:136; NG/GT:1350; IV Piggyback:900.2] Out: 7672 [Urine:428; Emesis/NG output:625; CNOBS:9628; Stool:360] Total I/O In: 176.8 [I.V.:175.8; Other:1] Out: 366 [Urine:50; Emesis/NG output:125; Other:180]  SpO2: 99 % O2 Flow Rate (L/min): 7 L/min FiO2 (%): 24 %  Estimated body mass index is 58.32 kg/m as calculated from the following:   Height as of this encounter: 6' (1.829 m).   Weight as of this encounter: 195 kg.     PHYSICAL exam: Physical examination is limited due to need for PPE/CAPR GENERAL: Morbidly obese male, intubated, mechanically ventilated HEAD: Normocephalic, atraumatic.  EYES: Pupils equal, round, reactive to light.  No scleral icterus.  MOUTH: Orotracheally intubated, OG in place. NECK: Supple. No thyromegaly. Trachea midline. No JVD.  No adenopathy. PULMONARY: Good air entry bilaterally.  Rhonchi throughout. CARDIOVASCULAR: Monitor shows: Regular rate and rhythm.  ABDOMEN: Obese, no apparent distention, soft MUSCULOSKELETAL: No joint deformity,  no clubbing, no edema.  NEUROLOGIC: Sedated SKIN: Intact,warm,dry.     MEDICATIONS: Scheduled Meds: . chlorhexidine gluconate (MEDLINE KIT)  15 mL Mouth Rinse BID  . Chlorhexidine Gluconate Cloth  6 each Topical Daily  . docusate  100 mg Per Tube BID  . feeding supplement (PROSource TF)  90 mL Per Tube QID  . feeding supplement (VITAL HIGH PROTEIN)  1,000 mL Per Tube Q24H  . heparin  5,000 Units Subcutaneous Q8H  . insulin aspart  0-20 Units Subcutaneous Q4H  . insulin aspart  5 Units  Subcutaneous Q4H  . insulin detemir  20 Units Subcutaneous QHS  . insulin detemir  40 Units Subcutaneous Daily  . mouth rinse  15 mL Mouth Rinse 10 times per day  . methylPREDNISolone (SOLU-MEDROL) injection  40 mg Intravenous Q24H  . multivitamin  1 tablet Per Tube QHS  . multivitamin  15 mL Per Tube Daily  . polyethylene glycol  17 g Per Tube Daily  . sodium chloride flush  3 mL Intravenous Q12H   Continuous Infusions: . sodium chloride 5 mL/hr at 01/18/21 2100  . dexmedetomidine (PRECEDEX) IV infusion 0.8 mcg/kg/hr (01/18/21 2100)  . dextrose    . doxycycline (VIBRAMYCIN) IV 100 mg (01/18/21 1409)  . famotidine (PEPCID) IV 20 mg (01/18/21 0914)  . fentaNYL infusion INTRAVENOUS 350 mcg/hr (01/18/21 2100)  . midazolam 1 mg/hr (01/18/21 2100)  . norepinephrine (LEVOPHED) Adult infusion 2 mcg/min (01/18/21 2100)  . prismasol BGK 2/2.5 dialysis solution 1,000 mL/hr at 01/18/21 1714  . prismasol BGK 2/2.5 replacement solution 200 mL/hr at 01/18/21 1224  . prismasol BGK 2/2.5 replacement solution 200 mL/hr at 01/18/21 1224   PRN Meds:.sodium chloride, acetaminophen (TYLENOL) oral liquid 160 mg/5 mL, dextrose, docusate sodium, fentaNYL, heparin, heparin, midazolam, midazolam, ondansetron **OR** ondansetron (ZOFRAN) IV, ondansetron (ZOFRAN) IV, polyethylene glycol, sodium chloride, sodium chloride flush    CULTURE RESULTS   Recent Results (from the past 240 hour(s))  Blood culture (routine single)     Status: None   Collection Time: 01/12/21  9:05 AM   Specimen: BLOOD  Result Value Ref Range Status   Specimen Description BLOOD LEFT AC  Final   Special Requests   Final    BOTTLES DRAWN AEROBIC ONLY Blood Culture results may not be optimal due to an inadequate volume of blood received in culture bottles   Culture   Final    NO GROWTH 5 DAYS Performed at [facility], [address] [place], [place] [address]    Report Status 01/17/2021 FINAL  Final  SARS Coronavirus 2 by  RT PCR (hospital order, performed in [place] hospital lab) Nasopharyngeal Nasopharyngeal Swab     Status: Abnormal   Collection Time: 01/12/21  9:36 AM   Specimen: Nasopharyngeal Swab  Result Value Ref Range Status   SARS Coronavirus 2 POSITIVE (A) NEGATIVE Final    Comment: RESULT CALLED TO, READ BACK BY AND VERIFIED WITH: ANNA JASPER ON 01/12/21 AT 1111 SDR (NOTE) SARS-CoV-2 target nucleic acids are DETECTED  SARS-CoV-2 RNA is generally detectable in upper respiratory specimens  during the acute phase of infection.  Positive results are indicative  of the presence of the identified virus, but do not rule out bacterial infection or co-infection with other pathogens not detected by the test.  Clinical correlation with patient history and  other diagnostic information is necessary to determine patient infection status.  The expected result is negative.  Fact Sheet for Patients:   StrictlyIdeas.no   Fact Sheet  for Healthcare Providers:   BankingDealers.co.za    This test is not yet approved or cleared by the [place] and  has been authorized for detection and/or diagnosis of SARS-CoV-2 by FDA under an Emergency Use Authorization (EUA).  This EUA will remain in effect (meaning this tes t can be used) for the duration of  the COVID-19 declaration under Section 564(b)(1) of the Act, 21 U.S.C. section 360-bbb-3(b)(1), unless the authorization is terminated or revoked sooner.  Performed at [facility], [address] [place], [place] [address]   Culture, blood (single)     Status: None   Collection Time: 01/12/21  1:08 PM   Specimen: BLOOD  Result Value Ref Range Status   Specimen Description BLOOD BLOOD RIGHT HAND  Final   Special Requests   Final    BOTTLES DRAWN AEROBIC AND ANAEROBIC Blood Culture adequate volume   Culture   Final    NO GROWTH 5 DAYS Performed at [facility], [address]  [place], [place] [address]    Report Status 01/17/2021 FINAL  Final  MRSA PCR Screening     Status: None   Collection Time: 01/12/21  7:22 PM   Specimen: Nasopharyngeal  Result Value Ref Range Status   MRSA by PCR NEGATIVE NEGATIVE Final    Comment:        The GeneXpert MRSA Assay (FDA approved for NASAL specimens only), is one component of a comprehensive MRSA colonization surveillance program. It is not intended to diagnose MRSA infection nor to guide or monitor treatment for MRSA infections. Performed at [facility], [address] [place], [place] [address]   Culture, respiratory (non-expectorated)     Status: None   Collection Time: 01/13/21  3:04 PM   Specimen: Tracheal Aspirate; Respiratory  Result Value Ref Range Status   Specimen Description   Final    TRACHEAL ASPIRATE Performed at [facility], [address] [place], [place] [address]    Special Requests   Final    NONE Performed at [facility], [address] [place], [place] [address]    Gram Stain   Final    ABUNDANT WBC PRESENT, PREDOMINANTLY PMN MODERATE GRAM POSITIVE COCCI Performed at [facility], [place] [address], [place],  [address]    Culture FEW STAPHYLOCOCCUS AUREUS  Final   Report Status 01/16/2021 FINAL  Final   Organism ID, Bacteria STAPHYLOCOCCUS AUREUS  Final      Susceptibility   Staphylococcus aureus - MIC*    CIPROFLOXACIN <=0.5 SENSITIVE Sensitive     ERYTHROMYCIN <=0.25 SENSITIVE Sensitive     GENTAMICIN <=0.5 SENSITIVE Sensitive     OXACILLIN 0.5 SENSITIVE Sensitive     TETRACYCLINE <=1 SENSITIVE Sensitive     VANCOMYCIN 1 SENSITIVE Sensitive     TRIMETH/SULFA <=10 SENSITIVE Sensitive     CLINDAMYCIN <=0.25 SENSITIVE Sensitive     RIFAMPIN <=0.5 SENSITIVE Sensitive     Inducible Clindamycin NEGATIVE Sensitive     * FEW STAPHYLOCOCCUS AUREUS      Results for orders placed or performed during the hospital encounter of 01/12/21 (from the  past 24 hour(s))  Glucose, capillary     Status: Abnormal   Collection Time: 01/17/21 11:01 PM  Result Value Ref Range   Glucose-Capillary 200 (H) 70 - 99 mg/dL  Glucose, capillary     Status: Abnormal   Collection Time: 01/18/21  3:16 AM  Result Value Ref Range   Glucose-Capillary 253 (H)  70 - 99 mg/dL  Magnesium     Status: None   Collection Time: 01/18/21  3:46 AM  Result Value Ref Range   Magnesium 2.0 1.7 - 2.4 mg/dL  Renal function panel     Status: Abnormal   Collection Time: 01/18/21  3:46 AM  Result Value Ref Range   Sodium 135 135 - 145 mmol/L   Potassium 3.6 3.5 - 5.1 mmol/L   Chloride 102 98 - 111 mmol/L   CO2 23 22 - 32 mmol/L   Glucose, Bld 269 (H) 70 - 99 mg/dL   BUN 66 (H) 6 - 20 mg/dL   Creatinine, Ser 4.24 (H) 0.61 - 1.24 mg/dL   Calcium 8.3 (L) 8.9 - 10.3 mg/dL   Phosphorus 5.7 (H) 2.5 - 4.6 mg/dL   Albumin 2.0 (L) 3.5 - 5.0 g/dL   GFR, Estimated 18 (L) >60 mL/min   Anion gap 10 5 - 15  CBC     Status: Abnormal   Collection Time: 01/18/21  3:46 AM  Result Value Ref Range   WBC 16.1 (H) 4.0 - 10.5 K/uL   RBC 3.90 (L) 4.22 - 5.81 MIL/uL   Hemoglobin 10.0 (L) 13.0 - 17.0 g/dL   HCT 32.2 (L) 39.0 - 52.0 %   MCV 82.6 80.0 - 100.0 fL   MCH 25.6 (L) 26.0 - 34.0 pg   MCHC 31.1 30.0 - 36.0 g/dL   RDW 14.4 11.5 - 15.5 %   Platelets 123 (L) 150 - 400 K/uL   nRBC 0.6 (H) 0.0 - 0.2 %  Glucose, capillary     Status: Abnormal   Collection Time: 01/18/21  7:46 AM  Result Value Ref Range   Glucose-Capillary 213 (H) 70 - 99 mg/dL  Glucose, capillary     Status: Abnormal   Collection Time: 01/18/21 11:37 AM  Result Value Ref Range   Glucose-Capillary 253 (H) 70 - 99 mg/dL  Glucose, capillary     Status: Abnormal   Collection Time: 01/18/21  4:10 PM  Result Value Ref Range   Glucose-Capillary 250 (H) 70 - 99 mg/dL  Renal function panel (daily at 1600)     Status: Abnormal   Collection Time: 01/18/21  4:18 PM  Result Value Ref Range   Sodium 135 135 - 145 mmol/L    Potassium 3.7 3.5 - 5.1 mmol/L   Chloride 102 98 - 111 mmol/L   CO2 24 22 - 32 mmol/L   Glucose, Bld 289 (H) 70 - 99 mg/dL   BUN 68 (H) 6 - 20 mg/dL   Creatinine, Ser 3.94 (H) 0.61 - 1.24 mg/dL   Calcium 8.5 (L) 8.9 - 10.3 mg/dL   Phosphorus 5.9 (H) 2.5 - 4.6 mg/dL   Albumin 2.0 (L) 3.5 - 5.0 g/dL   GFR, Estimated 20 (L) >60 mL/min   Anion gap 9 5 - 15  Glucose, capillary     Status: Abnormal   Collection Time: 01/18/21  7:32 PM  Result Value Ref Range   Glucose-Capillary 237 (H) 70 - 99 mg/dL        IMAGING    No results found.   Nutrition Status: Nutrition Problem: Inadequate oral intake Etiology: inability to eat Signs/Symptoms: NPO status Interventions: Tube feeding,Prostat     Indwelling Urinary Catheter continued, requirement due to   Reason to continue Indwelling Urinary Catheter strict Intake/Output monitoring for hemodynamic instability   Central Line/ continued, requirement due to  Reason to continue [employer] of central venous pressure or  other hemodynamic parameters and poor IV access   Ventilator continued, requirement due to severe respiratory failure   Ventilator Sedation RASS 0 to -2      ASSESSMENT AND PLAN SYNOPSIS 30 yo morbidly obese male with ASTHMA now with progressive multiorgan failure with COVID 19 pneumonia and ARDS with severe acidosis and severe hypoxia with ARDS with acute renal failure    Acute hypoxemic respiratory failure due to COVID-19 pneumonia/ARDS Mechanical ventilation via ARDS protocol, target PRVC 6 cc/kg Wean PEEP and FiO2 as able Goal plateau pressure less than 30, driving pressure less than 15 Paralytics if necessary for vent synchrony, gas exchange Unable to prone position due to CRRT, and morbid body habitus Deep sedation per PAD protocol VAP prevention order set Remdesivir Therapy completed IV STEROIDS Therapy Follow inflammatory markers as needed with CRP Vitamin C, zinc Plan to repeat  and check resp cultures as needed Begin weaning as tolerated -continue Full MV support -continue Bronchodilator Therapy -Wean Fio2 and PEEP as tolerated -VAP/VENT bundle implementation   Morbid obesity, possible OSA.   Will certainly impact respiratory mechanics, ventilator weaning Suspect will need to consider additional PEEP   ACUTE KIDNEY INJURY/Renal Failure-stage 5 -continue Foley Catheter-assess need -Avoid nephrotoxic agents -Follow urine output, BMP -Urine output appears to be increasing today -Ensure adequate renal perfusion, optimize oxygenation -Renal dose medications           -CRRT per nephrology     NEUROLOGY Acute toxic metabolic encephalopathy, need for sedation Goal RASS - -3 to -4  SEPSIS due to staph aureus pneumonia        - present on admission - due to post Viral pneumonia with COVID19 -use vasopressors to keep MAP>65 as needed -follow ABG and LA -follow up cultures -emperic ABX-completed doxy           MRSA PCR ordered  CARDIAC ICU monitoring  ID -continue IV abx as prescibed -follow up cultures  GI GI PROPHYLAXIS as indicated   DIET-->TF's as tolerated Constipation protocol as indicated   ENDO - will use ICU hypoglycemic\Hyperglycemia protocol if indicated    ELECTROLYTES -follow labs as needed -replace as needed -pharmacy consultation and following   DVT/GI PRX ordered and assessed TRANSFUSIONS AS NEEDED MONITOR FSBS I Assessed the need for Labs I Assessed the need for Foley I Assessed the need for Central Venous Line Family Discussion when available I Assessed the need for Mobilization I made an Assessment of medications to be adjusted accordingly Safety Risk assessment completed   CASE DISCUSSED IN MULTIDISCIPLINARY ROUNDS WITH ICU TEAM  Critical Care Time devoted to patient care services described in this note is 40 minutes.   Overall, patient is critically ill, prognosis is guarded.  Patient with multiorgan  failure and at high risk for cardiac arrest and death.     Renold Don, MD [facility]   *This note was dictated using voice recognition software/Dragon.  Despite best efforts to proofread, errors can occur which can change the meaning.  Any change was purely unintentional.

## 2021-01-18 NOTE — Progress Notes (Signed)
CRRT filter replaced and CRRT resumed. Will continue to monitor.

## 2021-01-18 NOTE — Progress Notes (Signed)
CRRTalarming with increased pressures, was able to return of blood without any difficulty.

## 2021-01-19 ENCOUNTER — Inpatient Hospital Stay: Payer: HRSA Program

## 2021-01-19 LAB — GLUCOSE, CAPILLARY
Glucose-Capillary: 164 mg/dL — ABNORMAL HIGH (ref 70–99)
Glucose-Capillary: 113 mg/dL — ABNORMAL HIGH (ref 70–99)
Glucose-Capillary: 145 mg/dL — ABNORMAL HIGH (ref 70–99)
Glucose-Capillary: 178 mg/dL — ABNORMAL HIGH (ref 70–99)
Glucose-Capillary: 163 mg/dL — ABNORMAL HIGH (ref 70–99)
Glucose-Capillary: 128 mg/dL — ABNORMAL HIGH (ref 70–99)

## 2021-01-19 LAB — CBC WITH DIFFERENTIAL/PLATELET
Abs Immature Granulocytes: 2.07 10*3/uL — ABNORMAL HIGH (ref 0.00–0.07)
Basophils Absolute: 0.1 10*3/uL (ref 0.0–0.1)
Basophils Relative: 1 %
Eosinophils Absolute: 0.1 10*3/uL (ref 0.0–0.5)
Eosinophils Relative: 1 %
HCT: 28.8 % — ABNORMAL LOW (ref 39.0–52.0)
Hemoglobin: 9.3 g/dL — ABNORMAL LOW (ref 13.0–17.0)
Immature Granulocytes: 11 %
Lymphocytes Relative: 18 %
Lymphs Abs: 3.3 10*3/uL (ref 0.7–4.0)
MCH: 26.3 pg (ref 26.0–34.0)
MCHC: 32.3 g/dL (ref 30.0–36.0)
MCV: 81.6 fL (ref 80.0–100.0)
Monocytes Absolute: 2.1 10*3/uL — ABNORMAL HIGH (ref 0.1–1.0)
Monocytes Relative: 11 %
Neutro Abs: 10.6 10*3/uL — ABNORMAL HIGH (ref 1.7–7.7)
Neutrophils Relative %: 58 %
Platelets: 121 10*3/uL — ABNORMAL LOW (ref 150–400)
RBC: 3.53 MIL/uL — ABNORMAL LOW (ref 4.22–5.81)
RDW: 14.6 % (ref 11.5–15.5)
Smear Review: NORMAL
WBC: 18.3 10*3/uL — ABNORMAL HIGH (ref 4.0–10.5)
nRBC: 0.6 % — ABNORMAL HIGH (ref 0.0–0.2)

## 2021-01-19 LAB — FIBRIN DERIVATIVES D-DIMER (ARMC ONLY): Fibrin derivatives D-dimer (ARMC): 1861.23 ng/mL (FEU) — ABNORMAL HIGH (ref 0.00–499.00)

## 2021-01-19 LAB — RENAL FUNCTION PANEL
Albumin: 2.1 g/dL — ABNORMAL LOW (ref 3.5–5.0)
Anion gap: 13 (ref 5–15)
BUN: 57 mg/dL — ABNORMAL HIGH (ref 6–20)
CO2: 22 mmol/L (ref 22–32)
Calcium: 8.7 mg/dL — ABNORMAL LOW (ref 8.9–10.3)
Chloride: 102 mmol/L (ref 98–111)
Creatinine, Ser: 3.42 mg/dL — ABNORMAL HIGH (ref 0.61–1.24)
GFR, Estimated: 24 mL/min — ABNORMAL LOW (ref >60)
Glucose, Bld: 200 mg/dL — ABNORMAL HIGH (ref 70–99)
Phosphorus: 5.2 mg/dL — ABNORMAL HIGH (ref 2.5–4.6)
Potassium: 3.4 mmol/L — ABNORMAL LOW (ref 3.5–5.1)
Sodium: 137 mmol/L (ref 135–145)

## 2021-01-19 LAB — FERRITIN: Ferritin: 776 ng/mL — ABNORMAL HIGH (ref 24–336)

## 2021-01-19 LAB — MAGNESIUM: Magnesium: 1.9 mg/dL (ref 1.7–2.4)

## 2021-01-19 LAB — C-REACTIVE PROTEIN: CRP: 11.6 mg/dL — ABNORMAL HIGH (ref <1.0)

## 2021-01-19 MED ORDER — METOCLOPRAMIDE HCL 10 MG PO TABS
5.0000 mg | ORAL_TABLET | Freq: Three times a day (TID) | ORAL | Status: DC
  Administered 2021-01-19 – 2021-01-21 (×6): 5 mg
  Filled 2021-01-19 (×18): qty 1

## 2021-01-19 MED ORDER — PRISMASOL BGK 4/2.5 32-4-2.5 MEQ/L REPLACEMENT SOLN
Status: DC
  Filled 2021-01-19 (×2): qty 5000

## 2021-01-19 MED ORDER — VITAL 1.5 CAL PO LIQD
1000.0000 mL | ORAL | Status: DC
  Administered 2021-01-19: 1000 mL

## 2021-01-19 MED ORDER — PRISMASOL BGK 4/2.5 32-4-2.5 MEQ/L EC SOLN: Status: DC

## 2021-01-19 NOTE — Progress Notes (Signed)
Video call with family 

## 2021-01-19 NOTE — Progress Notes (Signed)
PHARMACY CONSULT NOTE   Pharmacy Consult for Electrolyte Monitoring and Replacement   Recent Labs: Potassium (mmol/L)  Date Value  01/18/2021 3.7   Magnesium (mg/dL)  Date Value  57/32/2025 1.9   Calcium (mg/dL)  Date Value  42/70/6237 8.5 (L)   Albumin (g/dL)  Date Value  62/83/1517 2.0 (L)   Phosphorus (mg/dL)  Date Value  61/60/7371 5.9 (H)   Sodium (mmol/L)  Date Value  01/18/2021 135    Assessment: 30 year old male admitted with COVID-19 pneumonia and DKA. Started on an insulin drip. Patient with hypoxic respiratory failure requiring intubation 2/1. Patient hypotensive and requiring blood pressure support with Levophed. Patient remains intubated sedated and on mechanical ventilation in the ICU. Pharmacy to manage electrolytes.  Nutrition: Tube feeds + free water flushes 200 mL q2h (2.4 L/day)  MIVF: D5LR at 125 mL/hr switched to D10 @40  mL/hr.  Patient with worsening AKI since admission despite fluid resuscitation. Nephrology following. Starting CRRT 2/3.  Goal of Therapy:  Electrolytes WNL  Plan:  Patient remains on CRRT. Insulin drip transitioned to subcu regimen. No replacement needed at this time. Continue to follow along.  , PharmD 01/19/2021 11:50 AM

## 2021-01-19 NOTE — Progress Notes (Signed)
[place], [place] 01/19/21  Subjective:   Hospital day # 7 Patient remains critically ill. Still on CRRT. Case discussed with nursing in depth today. We will add 50 cc of ultrafiltration per hour.  Renal:  02/07 0701 - 02/08 0700 In: 2694.3 [I.V.:1863.8; NG/GT:184.5; IV Piggyback:600] Out: 8182 [Urine:379; Emesis/NG output:1150; Stool:200] Lab Results  Component Value Date   CREATININE 3.94 (H) 01/18/2021   CREATININE 4.24 (H) 01/18/2021   CREATININE 4.35 (H) 01/17/2021      Objective:  Vital signs in last 24 hours:  Temp:  [97.3 F (36.3 C)-98.8 F (37.1 C)] 98.3 F (36.8 C) (02/08 1600) Pulse Rate:  [57-113] 95 (02/08 1600) Resp:  [11-25] 11 (02/08 1600) BP: (89-139)/(54-89) 123/72 (02/08 1600) SpO2:  [98 %-100 %] 99 % (02/08 1600) FiO2 (%):  [24 %] 24 % (02/08 1600) Weight:  [192.8 kg] 192.8 kg (02/08 0500)  Weight change: -2.268 kg Filed Weights   01/17/21 0500 01/18/21 0345 01/19/21 0500  Weight: (!) 195.5 kg (!) 195 kg (!) 192.8 kg    Intake/Output:    Intake/Output Summary (Last 24 hours) at 01/19/2021 1619 Last data filed at 01/19/2021 1600 Gross per 24 hour  Intake 2775.84 ml  Output 4547 ml  Net -1771.16 ml    Physical Exam: General: Critically ill-appearing  HEENT ETT in place  Lungs: Vent assisted  Heart:: Regular  Abdomen: Mild distension  Extremities: no edema  Neurologic: sedated  Skin: No acute rash  Access: left IJ temp cath  Foley: Present      Basic Metabolic Panel:  Recent Labs  Lab 01/14/21 0230 01/14/21 0931 01/15/21 0402 01/15/21 1549 01/16/21 0419 01/16/21 1621 01/17/21 0433 01/17/21 1536 01/18/21 0346 01/18/21 1618 01/19/21 0409  NA 155*   < > 144   < > 139 136 137 136 135 135  --   K 4.8   < > 4.5   < > 4.4 4.2 3.8 3.7 3.6 3.7  --   CL 119*   < > 113*   < > 105 105 102 104 102 102  --   CO2 23   < > 23   < > 22 21* 23 21* 23 24  --   GLUCOSE 134*   < > 213*   < > 256* 304* 245*  249* 269* 289*  --   BUN 75*   < > 80*   < > 74* 71* 64* 65* 66* 68*  --   CREATININE 5.86*   < > 6.56*   < > 5.83* 5.15* 4.88* 4.35* 4.24* 3.94*  --   CALCIUM 8.1*   < > 7.5*   < > 7.8* 7.8* 8.2* 8.0* 8.3* 8.5*  --   MG 3.5*  --  2.8*  --  2.4  --   --   --  2.0  --  1.9  PHOS 5.7*   < > 7.8*   < > 7.4* 6.7* 5.9* 4.9* 5.7* 5.9*  --    < > = values in this interval not displayed.     CBC: Recent Labs  Lab 01/14/21 0230 01/15/21 0402 01/16/21 0419 01/17/21 0433 01/18/21 0346 01/19/21 0409  WBC 16.0* 15.0* 14.3* 10.4 16.1* 18.3*  NEUTROABS 12.8* 12.1* 10.7* 7.2  --  10.6*  HGB 12.5* 11.5* 10.6* 9.9* 10.0* 9.3*  HCT 43.9 39.3 35.0* 30.6* 32.2* 28.8*  MCV 89.8 87.9 85.6 81.4 82.6 81.6  PLT 274 205 143* 111* 123* 121*     No  results found for: HEPBSAG, HEPBSAB, [place]    Microbiology:  Recent Results (from the past 240 hour(s))  Blood culture (routine single)     Status: None   Collection Time: 01/12/21  9:05 AM   Specimen: BLOOD  Result Value Ref Range Status   Specimen Description BLOOD LEFT AC  Final   Special Requests   Final    BOTTLES DRAWN AEROBIC ONLY Blood Culture results may not be optimal due to an inadequate volume of blood received in culture bottles   Culture   Final    NO GROWTH 5 DAYS Performed at [facility], [address] [place], [place] [address]    Report Status 01/17/2021 FINAL  Final  SARS Coronavirus 2 by RT PCR (hospital order, performed in [place] hospital lab) Nasopharyngeal Nasopharyngeal Swab     Status: Abnormal   Collection Time: 01/12/21  9:36 AM   Specimen: Nasopharyngeal Swab  Result Value Ref Range Status   SARS Coronavirus 2 POSITIVE (A) NEGATIVE Final    Comment: RESULT CALLED TO, READ BACK BY AND VERIFIED WITH: ANNA JASPER ON 01/12/21 AT 1111 SDR (NOTE) SARS-CoV-2 target nucleic acids are DETECTED  SARS-CoV-2 RNA is generally detectable in upper respiratory specimens  during the acute phase of infection.   Positive results are indicative  of the presence of the identified virus, but do not rule out bacterial infection or co-infection with other pathogens not detected by the test.  Clinical correlation with patient history and  other diagnostic information is necessary to determine patient infection status.  The expected result is negative.  Fact Sheet for Patients:   StrictlyIdeas.no   Fact Sheet for Healthcare Providers:   BankingDealers.co.za    This test is not yet approved or cleared by the [place] FDA and  has been authorized for detection and/or diagnosis of SARS-CoV-2 by FDA under an Emergency Use Authorization (EUA).  This EUA will remain in effect (meaning this tes t can be used) for the duration of  the COVID-19 declaration under Section 564(b)(1) of the Act, 21 U.S.C. section 360-bbb-3(b)(1), unless the authorization is terminated or revoked sooner.  Performed at [facility], [address] [place], [place] [address]   Culture, blood (single)     Status: None   Collection Time: 01/12/21  1:08 PM   Specimen: BLOOD  Result Value Ref Range Status   Specimen Description BLOOD BLOOD RIGHT HAND  Final   Special Requests   Final    BOTTLES DRAWN AEROBIC AND ANAEROBIC Blood Culture adequate volume   Culture   Final    NO GROWTH 5 DAYS Performed at [facility], [address] [place], [place] [address]    Report Status 01/17/2021 FINAL  Final  MRSA PCR Screening     Status: None   Collection Time: 01/12/21  7:22 PM   Specimen: Nasopharyngeal  Result Value Ref Range Status   MRSA by PCR NEGATIVE NEGATIVE Final    Comment:        The GeneXpert MRSA Assay (FDA approved for NASAL specimens only), is one component of a comprehensive MRSA colonization surveillance program. It is not intended to diagnose MRSA infection nor to guide or monitor treatment for MRSA infections. Performed at [facility], [address] [place], [place] [address]   Culture, respiratory (non-expectorated)     Status: None   Collection Time: 01/13/21  3:04 PM   Specimen: Tracheal Aspirate; Respiratory  Result Value Ref Range Status  Specimen Description   Final    TRACHEAL ASPIRATE Performed at [facility], [address] [place], [place] [address]    Special Requests   Final    NONE Performed at [facility], [place], [place] [address]    Gram Stain   Final    ABUNDANT WBC PRESENT, PREDOMINANTLY PMN MODERATE GRAM POSITIVE COCCI Performed at [facility], [place] [address], [place], [place] [address]    Culture FEW STAPHYLOCOCCUS AUREUS  Final   Report Status 01/16/2021 FINAL  Final   Organism ID, Bacteria STAPHYLOCOCCUS AUREUS  Final      Susceptibility   Staphylococcus aureus - MIC*    CIPROFLOXACIN <=0.5 SENSITIVE Sensitive     ERYTHROMYCIN <=0.25 SENSITIVE Sensitive     GENTAMICIN <=0.5 SENSITIVE Sensitive     OXACILLIN 0.5 SENSITIVE Sensitive     TETRACYCLINE <=1 SENSITIVE Sensitive     VANCOMYCIN 1 SENSITIVE Sensitive     TRIMETH/SULFA <=10 SENSITIVE Sensitive     CLINDAMYCIN <=0.25 SENSITIVE Sensitive     RIFAMPIN <=0.5 SENSITIVE Sensitive     Inducible Clindamycin NEGATIVE Sensitive     * FEW STAPHYLOCOCCUS AUREUS    Coagulation Studies: No results for input(s): LABPROT, INR in the last 72 hours.  Urinalysis: No results for input(s): COLORURINE, LABSPEC, PHURINE, GLUCOSEU, HGBUR, BILIRUBINUR, KETONESUR, PROTEINUR, UROBILINOGEN, NITRITE, LEUKOCYTESUR in the last 72 hours.  Invalid input(s): APPERANCEUR    Imaging: DG Abd 1 View  Result Date: 01/19/2021 CLINICAL DATA:  Ileus.  Reported COVID-19 pneumonitis EXAM: ABDOMEN - 1 VIEW COMPARISON:  January 13, 2021. FINDINGS: The nasogastric tube tip in region of pylorus. Right femoral catheter tip is at the L4 level. Apparent rectal thermometer present. There is a relative paucity  of bowel gas. No bowel dilatation or air-fluid levels. No free air evident on supine examination. Airspace opacity left base, incompletely visualized. IMPRESSION: Nasogastric tube tip at gastric pylorus level. Paucity of bowel gas may be indicative of ileus or enteritis. Bowel obstruction felt to be less likely. No free air evident. Electronically Signed   By: Lowella Grip III M.D.   On: 01/19/2021 11:31   DG Chest Port 1 View  Result Date: 01/19/2021 CLINICAL DATA:  Acute respiratory failure EXAM: PORTABLE CHEST 1 VIEW COMPARISON:  01/15/2021 FINDINGS: The endotracheal tube terminates above the carina. The left-sided dialysis catheter is well position. The enteric tube terminates below the left hemidiaphragm. The lung volumes are low. There is no pneumothorax. There are findings of volume overload/vascular congestion. There is bibasilar atelectasis with probable small bilateral pleural effusions. IMPRESSION: Lines and tubes as above. Low lung volumes with vascular congestion and bibasilar atelectasis. Electronically Signed   By: Constance Holster M.D.   On: 01/19/2021 01:36     Medications:   . sodium chloride 5 mL/hr at 01/19/21 1600  . dexmedetomidine (PRECEDEX) IV infusion 0.6 mcg/kg/hr (01/19/21 1600)  . dextrose    . famotidine (PEPCID) IV Stopped (01/19/21 0835)  . fentaNYL infusion INTRAVENOUS 325 mcg/hr (01/19/21 1600)  . midazolam 2 mg/hr (01/19/21 1600)  . norepinephrine (LEVOPHED) Adult infusion Stopped (01/19/21 1307)  . prismasol BGK 2/2.5 dialysis solution 1,000 mL/hr at 01/19/21 1407  . prismasol BGK 2/2.5 replacement solution 200 mL/hr at 01/19/21 1340  . prismasol BGK 2/2.5 replacement solution 200 mL/hr at 01/19/21 1310   . chlorhexidine gluconate (MEDLINE KIT)  15 mL Mouth Rinse BID  . Chlorhexidine Gluconate Cloth  6 each Topical Daily  . docusate  100 mg Per Tube BID  . feeding supplement (VITAL 1.5 CAL)  1,000 mL Per Tube Q24H  . heparin  5,000 Units Subcutaneous  Q8H  . insulin aspart  0-20 Units Subcutaneous Q4H  . insulin aspart  5 Units Subcutaneous Q4H  . insulin detemir  20 Units Subcutaneous QHS  . insulin detemir  40 Units Subcutaneous Daily  . mouth rinse  15 mL Mouth Rinse 10 times per day  . methylPREDNISolone (SOLU-MEDROL) injection  40 mg Intravenous Q24H  . metoCLOPramide  5 mg Per Tube Q8H  . multivitamin  1 tablet Per Tube QHS  . polyethylene glycol  17 g Per Tube Daily  . sodium chloride flush  3 mL Intravenous Q12H   sodium chloride, acetaminophen (TYLENOL) oral liquid 160 mg/5 mL, dextrose, docusate sodium, fentaNYL, heparin, heparin, midazolam, midazolam, ondansetron **OR** ondansetron (ZOFRAN) IV, ondansetron (ZOFRAN) IV, polyethylene glycol, sodium chloride, sodium chloride flush  Assessment/ Plan:  30 y.o. male with h/o Asthma, no recent medical care , was  admitted on 01/12/2021 for ARDS (adult respiratory distress syndrome) ([place]) [J80] Acute respiratory failure with hypoxia ([place]) [J96.01] Acute hypoxemic respiratory failure due to COVID-19 ([facility]) [U07.1, J96.01] Hyperosmolar hyperglycemic state (HHS) ([place]) [E11.00, E11.65] Pneumonia due to COVID-19 virus [U07.1, J12.82]  # AKI Baseline creatinine is unknown U/a- 6-10 RBCs, 11-20 WBCs, UPC of 0.24 US renal - unremarkable, no obstruction 02/07 0701 - 02/08 0700 In: 2694.3 [I.V.:1863.8; NG/GT:184.5; IV Piggyback:600] Out: 3403 [Urine:379; Emesis/NG output:1150; Stool:200] -Urine output only 379 cc over the preceding 24 hours.  We will plan to continue CRRT.  Add ultrafiltration of 50 cc/h.  Continue to monitor serum electrolytes..  # hypernatremia -Now resolved.   # New Dx of Diabetes, type 1 uncontrolled Lab Results  Component Value Date   HGBA1C 13.6 (H) 01/12/2021  Markedly elevated hemoglobin A1c as documented above.  Insulin management as per pulmonary/critical care.  # Acute respiratory failure # Covid 19-infection Continues to have some difficulties with  weaning from the ventilator.  Hopefully with adding some ultrafiltration we will achieve a dry lung strategy and thereby be able to assist him coming off the ventilator.     LOS: 7 Per [place]ag[place] 2/8/20224:19 PM  [place], [place]  Note: This note was prepared with Dragon dictation. Any transcription errors are unintentional

## 2021-01-19 NOTE — Progress Notes (Signed)
Nutrition Follow-up  DOCUMENTATION CODES:   Morbid obesity  INTERVENTION:  Initiate Vital 1.5 Cal at trickle rate of 15 mL/hr.  If patient tolerating tomorrow consider advancing by 10 mL/hr every 4 hours to goal regimen of Vital 1.5 Cal at 65 mL/hr (1560 mL goal daily volume) + PROSource TF 90 mL TID per tube. Goal regimen provides 2580 kcal, 171 grams of protein, 1186 mL H2O daily.  Continue Rena-vit QHS per tube.  NUTRITION DIAGNOSIS:   Inadequate oral intake related to inability to eat as evidenced by NPO status.  Ongoing.  GOAL:   Patient will meet greater than or equal to 90% of their needs  Met with TF regimen.  MONITOR:   Vent status,Labs,Weight trends,TF tolerance,I & O's  REASON FOR ASSESSMENT:   Ventilator,Consult Assessment of nutrition requirement/status,Enteral/tube feeding initiation and management  ASSESSMENT:   30 year old male with PMHx of asthma admitted with COVID-19 PNA and AKI.  2/1 intubated 2/7 emesis x3; tube feeds held  Patient is currently intubated on ventilator support MV: 12 L/min Temp (24hrs), Avg:98.1 F (36.7 C), Min:96.9 F (36.1 C), Max:98.8 F (37.1 C)  Medications reviewed and include: Colace 100 mg BID, Novolog 0-20 units Q4hrs, Novolog 5 units Q4hrs, Levemir 20 units QHS, Levemir 40 units daily, Solu-Medrol 40 mg Q24hrs IV, Rena-vit QHS, Miralax, liquid MVI, Precedex gtt, doxycycline, fentanyl gtt, Versed gtt, norepinephrine gtt at 2 mcg/min.  Labs reviewed: CBG 113-197. No chem profile available today. On 2/7 BUN 68, Creatinine 3.94, Phosphorus 5.9.  I/O: 379 mL UOP yesterday (0.1 mL/kg/hr); 1150 mL output from NGT yesterday + 3 occurrences emesis; 3066 mL removed from CRRT yesterday; 200 mL output from rectal tube; total of +16664.7 mL from admission  Weight trend: 192.8 kg on 2/8; +33.2 kg from 2/1  Enteral Access: OGT placed 2/1; terminates in distal aspect of the gastric body per abdominal x-ray 2/2  TF regimen:  Vital High Protein at 40 mL/hr + PROSource TF 90 mL QID per tube  Discussed with RN and on rounds. Propofol gtt now discontinued. Patient on CRRT. Tube feeds were held yesterday after emesis. Plan for abdominal x-ray. Discussed via secure chat in the afternoon and plan is for initiation of trickle feeds and prokinetic agent.  NUTRITION - FOCUSED PHYSICAL EXAM:  Flowsheet Row Most Recent Value  Orbital Region No depletion  Upper Arm Region No depletion  Thoracic and Lumbar Region No depletion  Buccal Region Unable to assess  Temple Region No depletion  Clavicle Bone Region No depletion  Clavicle and Acromion Bone Region No depletion  Scapular Bone Region Unable to assess  Dorsal Hand No depletion  Patellar Region No depletion  Anterior Thigh Region No depletion  Posterior Calf Region No depletion  Edema (RD Assessment) Mild  Hair Reviewed  Eyes Unable to assess  Mouth Unable to assess  Skin Reviewed  Nails Reviewed     Diet Order:   Diet Order            Diet NPO time specified  Diet effective now                EDUCATION NEEDS:   No education needs have been identified at this time  Skin:  Skin Assessment: Reviewed RN Assessment  Last BM:  01/19/2021 - 25 mL rectal tube  Height:   Ht Readings from Last 1 Encounters:  01/12/21 6' (1.829 m)   Weight:   Wt Readings from Last 1 Encounters:  01/19/21 (!) 192.8 kg  Ideal Body Weight:  80.9 kg  BMI:  Body mass index is 57.64 kg/m.  Estimated Nutritional Needs:   Kcal:  2600-2800  Protein:  160-170 grams  Fluid:  >/= 2 L/day  Jacklynn Barnacle, MS, RD, LDN Pager number available on Amion

## 2021-01-19 NOTE — Plan of Care (Signed)
Pt follows simple motor commands but some difficulty tracking observed.  CRRT running w/no issues, very minimal pressor requirements, PFR increased to 50 cc/hr per Dr Cherylann Ratel.  Pt w/near absent right sided breath sounds and near absent bowel sounds.  Intensivist aware, RT to do recruitment maneuvers.  ABD XR ordered. TF to be restarted this shift s/p ABD XR (see results)

## 2021-01-19 NOTE — Progress Notes (Signed)
CRRT filter replaced and CRRT resumed without complication. Will continue to monitor.

## 2021-01-19 NOTE — Progress Notes (Signed)
CRRT filter pressure continue to increase. 193 ml of blood returned without complications.

## 2021-01-20 ENCOUNTER — Inpatient Hospital Stay (HOSPITAL_COMMUNITY)
Admit: 2021-01-20 | Discharge: 2021-01-20 | Disposition: A | Discharge status: Home / Self Care | Payer: HRSA Program | Attending: Internal Medicine | Admitting: Internal Medicine

## 2021-01-20 ENCOUNTER — Encounter
Admission: EM | Disposition: A | Discharge status: Rehabilitation Facility | Payer: Self-pay | Source: Home / Self Care | Attending: Internal Medicine

## 2021-01-20 ENCOUNTER — Encounter: Payer: Self-pay | Admitting: Internal Medicine

## 2021-01-20 DIAGNOSIS — I472 Ventricular tachycardia: Secondary | ICD-10-CM

## 2021-01-20 DIAGNOSIS — I213 ST elevation (STEMI) myocardial infarction of unspecified site: Secondary | ICD-10-CM

## 2021-01-20 DIAGNOSIS — D6489 Other specified anemias: Secondary | ICD-10-CM

## 2021-01-20 DIAGNOSIS — N179 Acute kidney failure, unspecified: Secondary | ICD-10-CM

## 2021-01-20 HISTORY — PX: LEFT HEART CATH AND CORONARY ANGIOGRAPHY: CATH118249

## 2021-01-20 LAB — C-REACTIVE PROTEIN: CRP: 13.4 mg/dL — ABNORMAL HIGH (ref <1.0)

## 2021-01-20 LAB — CBC WITH DIFFERENTIAL/PLATELET
Abs Immature Granulocytes: 1.89 10*3/uL — ABNORMAL HIGH (ref 0.00–0.07)
Basophils Absolute: 0.1 10*3/uL (ref 0.0–0.1)
Basophils Relative: 0 %
Eosinophils Absolute: 0.1 10*3/uL (ref 0.0–0.5)
Eosinophils Relative: 1 %
HCT: 27.4 % — ABNORMAL LOW (ref 39.0–52.0)
Hemoglobin: 8.6 g/dL — ABNORMAL LOW (ref 13.0–17.0)
Immature Granulocytes: 13 %
Lymphocytes Relative: 17 %
Lymphs Abs: 2.5 10*3/uL (ref 0.7–4.0)
MCH: 25.9 pg — ABNORMAL LOW (ref 26.0–34.0)
MCHC: 31.4 g/dL (ref 30.0–36.0)
MCV: 82.5 fL (ref 80.0–100.0)
Monocytes Absolute: 1.6 10*3/uL — ABNORMAL HIGH (ref 0.1–1.0)
Monocytes Relative: 11 %
Neutro Abs: 8.7 10*3/uL — ABNORMAL HIGH (ref 1.7–7.7)
Neutrophils Relative %: 58 %
Platelets: 136 10*3/uL — ABNORMAL LOW (ref 150–400)
RBC: 3.32 MIL/uL — ABNORMAL LOW (ref 4.22–5.81)
RDW: 14.1 % (ref 11.5–15.5)
Smear Review: NORMAL
WBC: 15 10*3/uL — ABNORMAL HIGH (ref 4.0–10.5)
nRBC: 0.4 % — ABNORMAL HIGH (ref 0.0–0.2)

## 2021-01-20 LAB — ECHOCARDIOGRAM COMPLETE
AR max vel: 3.23 cm2
AV Area VTI: 3.59 cm2
AV Area mean vel: 3.44 cm2
AV Mean grad: 4 mmHg
AV Peak grad: 7.7 mmHg
Ao pk vel: 1.39 m/s
Area-P 1/2: 3.56 cm2
Height: 72 in
MV VTI: 2.67 cm2
S' Lateral: 2.9 cm
Weight: 6800 oz

## 2021-01-20 LAB — CBC
HCT: 28.6 % — ABNORMAL LOW (ref 39.0–52.0)
Hemoglobin: 9 g/dL — ABNORMAL LOW (ref 13.0–17.0)
MCH: 26 pg (ref 26.0–34.0)
MCHC: 31.5 g/dL (ref 30.0–36.0)
MCV: 82.7 fL (ref 80.0–100.0)
Platelets: 138 10*3/uL — ABNORMAL LOW (ref 150–400)
RBC: 3.46 MIL/uL — ABNORMAL LOW (ref 4.22–5.81)
RDW: 14.4 % (ref 11.5–15.5)
WBC: 18 10*3/uL — ABNORMAL HIGH (ref 4.0–10.5)
nRBC: 0.3 % — ABNORMAL HIGH (ref 0.0–0.2)

## 2021-01-20 LAB — CREATININE, SERUM
Creatinine, Ser: 3.59 mg/dL — ABNORMAL HIGH (ref 0.61–1.24)
GFR, Estimated: 23 mL/min — ABNORMAL LOW (ref >60)

## 2021-01-20 LAB — GLUCOSE, CAPILLARY
Glucose-Capillary: 101 mg/dL — ABNORMAL HIGH (ref 70–99)
Glucose-Capillary: 170 mg/dL — ABNORMAL HIGH (ref 70–99)
Glucose-Capillary: 189 mg/dL — ABNORMAL HIGH (ref 70–99)
Glucose-Capillary: 194 mg/dL — ABNORMAL HIGH (ref 70–99)
Glucose-Capillary: 205 mg/dL — ABNORMAL HIGH (ref 70–99)
Glucose-Capillary: 224 mg/dL — ABNORMAL HIGH (ref 70–99)

## 2021-01-20 LAB — MAGNESIUM
Magnesium: 2.1 mg/dL (ref 1.7–2.4)
Magnesium: 2.2 mg/dL (ref 1.7–2.4)

## 2021-01-20 LAB — FIBRIN DERIVATIVES D-DIMER (ARMC ONLY): Fibrin derivatives D-dimer (ARMC): 2432.17 ng/mL (FEU) — ABNORMAL HIGH (ref 0.00–499.00)

## 2021-01-20 LAB — RENAL FUNCTION PANEL
Albumin: 1.9 g/dL — ABNORMAL LOW (ref 3.5–5.0)
Anion gap: 12 (ref 5–15)
BUN: 51 mg/dL — ABNORMAL HIGH (ref 6–20)
CO2: 23 mmol/L (ref 22–32)
Calcium: 8.2 mg/dL — ABNORMAL LOW (ref 8.9–10.3)
Chloride: 103 mmol/L (ref 98–111)
Creatinine, Ser: 3.37 mg/dL — ABNORMAL HIGH (ref 0.61–1.24)
GFR, Estimated: 24 mL/min — ABNORMAL LOW (ref >60)
Glucose, Bld: 135 mg/dL — ABNORMAL HIGH (ref 70–99)
Phosphorus: 5 mg/dL — ABNORMAL HIGH (ref 2.5–4.6)
Potassium: 2.8 mmol/L — ABNORMAL LOW (ref 3.5–5.1)
Sodium: 138 mmol/L (ref 135–145)

## 2021-01-20 LAB — TROPONIN I (HIGH SENSITIVITY)
Troponin I (High Sensitivity): 47 ng/L — ABNORMAL HIGH (ref <18)
Troponin I (High Sensitivity): 4189 ng/L (ref <18)
Troponin I (High Sensitivity): 6440 ng/L (ref <18)

## 2021-01-20 LAB — HEPATITIS B SURFACE ANTIBODY,QUALITATIVE: Hep B S Ab: NONREACTIVE

## 2021-01-20 LAB — HEPATITIS C ANTIBODY: HCV Ab: NONREACTIVE

## 2021-01-20 LAB — POTASSIUM: Potassium: 4.1 mmol/L (ref 3.5–5.1)

## 2021-01-20 LAB — FERRITIN: Ferritin: 779 ng/mL — ABNORMAL HIGH (ref 24–336)

## 2021-01-20 LAB — HEPATITIS B CORE ANTIBODY, TOTAL: Hep B Core Total Ab: NONREACTIVE

## 2021-01-20 LAB — HEPATITIS B SURFACE ANTIGEN: Hepatitis B Surface Ag: NONREACTIVE

## 2021-01-20 SURGERY — LEFT HEART CATH AND CORONARY ANGIOGRAPHY
Anesthesia: Moderate Sedation

## 2021-01-20 MED ORDER — HYDRALAZINE HCL 20 MG/ML IJ SOLN: 10.0000 mg | INTRAMUSCULAR | Status: AC | PRN

## 2021-01-20 MED ORDER — MIDAZOLAM HCL 2 MG/2ML IJ SOLN
INTRAMUSCULAR | Status: AC
  Filled 2021-01-20: qty 2

## 2021-01-20 MED ORDER — HEPARIN SODIUM (PORCINE) 1000 UNIT/ML IJ SOLN
INTRAMUSCULAR | Status: DC | PRN
  Administered 2021-01-20: 5000 [IU] via INTRAVENOUS

## 2021-01-20 MED ORDER — HEPARIN BOLUS VIA INFUSION
4000.0000 [IU] | Freq: Once | INTRAVENOUS | Status: AC
  Administered 2021-01-20: 4000 [IU] via INTRAVENOUS
  Filled 2021-01-20: qty 4000

## 2021-01-20 MED ORDER — HEPARIN (PORCINE) 25000 UT/250ML-% IV SOLN
1600.0000 [IU]/h | INTRAVENOUS | Status: DC
  Administered 2021-01-20: 1600 [IU]/h via INTRAVENOUS
  Filled 2021-01-20: qty 250

## 2021-01-20 MED ORDER — IOHEXOL 300 MG/ML  SOLN
INTRAMUSCULAR | Status: DC | PRN
  Administered 2021-01-20: 50 mL

## 2021-01-20 MED ORDER — ASPIRIN 81 MG PO CHEW
81.0000 mg | CHEWABLE_TABLET | Freq: Every day | ORAL | Status: DC
  Administered 2021-01-20 – 2021-01-21 (×2): 81 mg
  Filled 2021-01-20 (×2): qty 1

## 2021-01-20 MED ORDER — LIDOCAINE HCL (PF) 1 % IJ SOLN
INTRAMUSCULAR | Status: AC
  Filled 2021-01-20: qty 30

## 2021-01-20 MED ORDER — HEPARIN SODIUM (PORCINE) 1000 UNIT/ML IJ SOLN
INTRAMUSCULAR | Status: AC
  Filled 2021-01-20: qty 1

## 2021-01-20 MED ORDER — HEPARIN (PORCINE) IN NACL 1000-0.9 UT/500ML-% IV SOLN
INTRAVENOUS | Status: DC | PRN
  Administered 2021-01-20: 500 mL

## 2021-01-20 MED ORDER — MIDAZOLAM HCL 2 MG/2ML IJ SOLN
INTRAMUSCULAR | Status: DC | PRN
  Administered 2021-01-20: 2 mg via INTRAVENOUS

## 2021-01-20 MED ORDER — HEPARIN SODIUM (PORCINE) 5000 UNIT/ML IJ SOLN: 5000.0000 [IU] | Freq: Three times a day (TID) | INTRAMUSCULAR | Status: DC

## 2021-01-20 MED ORDER — VERAPAMIL HCL 2.5 MG/ML IV SOLN
INTRAVENOUS | Status: AC
  Filled 2021-01-20: qty 2

## 2021-01-20 MED ORDER — SODIUM CHLORIDE 0.9% FLUSH
3.0000 mL | Freq: Two times a day (BID) | INTRAVENOUS | Status: DC
  Administered 2021-01-20 – 2021-02-03 (×28): 3 mL via INTRAVENOUS

## 2021-01-20 MED ORDER — HEPARIN (PORCINE) 25000 UT/250ML-% IV SOLN: 1600.0000 [IU]/h | INTRAVENOUS | Status: DC

## 2021-01-20 MED ORDER — LIDOCAINE HCL (PF) 1 % IJ SOLN
INTRAMUSCULAR | Status: DC | PRN
  Administered 2021-01-20: 2 mL

## 2021-01-20 MED ORDER — VERAPAMIL HCL 2.5 MG/ML IV SOLN
INTRAVENOUS | Status: DC | PRN
  Administered 2021-01-20: 2.5 mg via INTRA_ARTERIAL

## 2021-01-20 MED ORDER — ATORVASTATIN CALCIUM 80 MG PO TABS
80.0000 mg | ORAL_TABLET | Freq: Every day | ORAL | Status: DC
  Administered 2021-01-21: 80 mg
  Filled 2021-01-20: qty 4

## 2021-01-20 MED ORDER — ATORVASTATIN CALCIUM 20 MG PO TABS
80.0000 mg | ORAL_TABLET | Freq: Every day | ORAL | Status: DC
  Administered 2021-01-20: 80 mg via ORAL
  Filled 2021-01-20: qty 4

## 2021-01-20 MED ORDER — SODIUM CHLORIDE 0.9% FLUSH
3.0000 mL | INTRAVENOUS | Status: DC | PRN
  Administered 2021-02-01: 3 mL via INTRAVENOUS

## 2021-01-20 MED ORDER — FENTANYL CITRATE (PF) 100 MCG/2ML IJ SOLN
INTRAMUSCULAR | Status: AC
  Filled 2021-01-20: qty 2

## 2021-01-20 MED ORDER — SODIUM CHLORIDE 0.9 % IV SOLN: 250.0000 mL | INTRAVENOUS | Status: DC | PRN

## 2021-01-20 MED ORDER — POTASSIUM CHLORIDE 10 MEQ/50ML IV SOLN
10.0000 meq | INTRAVENOUS | Status: AC
  Administered 2021-01-20 (×5): 10 meq via INTRAVENOUS
  Filled 2021-01-20 (×6): qty 50

## 2021-01-20 SURGICAL SUPPLY — 10 items
CATH 5F 110X4 TIG (CATHETERS) ×2 IMPLANT
CATH INFINITI 5 FR MPA2 (CATHETERS) ×2 IMPLANT
DEVICE RAD COMP TR BAND LRG (VASCULAR PRODUCTS) ×2 IMPLANT
GLIDESHEATH SLEND SS 6F .021 (SHEATH) ×2 IMPLANT
GUIDEWIRE INQWIRE 1.5J.035X260 (WIRE) ×1 IMPLANT
INQWIRE 1.5J .035X260CM (WIRE) ×2
KIT ENCORE 26 ADVANTAGE (KITS) IMPLANT
KIT MANI 3VAL PERCEP (MISCELLANEOUS) ×2 IMPLANT
PACK CARDIAC CATH (CUSTOM PROCEDURE TRAY) ×2 IMPLANT
SHIELD X-DRAPE GOLD 12X17 (MISCELLANEOUS) ×2 IMPLANT

## 2021-01-20 NOTE — Progress Notes (Signed)
[place], [place] 01/20/21  Subjective:   Hospital day # 8 Critical illness persist. Urine output has trended up slightly at 530 cc over the preceding 24 hours. Remains on CRRT.  Renal:  02/08 0701 - 02/09 0700 In: 2201.7 [I.V.:1726.9; NG/GT:424.8; IV Piggyback:50] Out: 9242 [Urine:515; Emesis/NG output:300; Stool:100] Lab Results  Component Value Date   CREATININE 3.37 (H) 01/20/2021   CREATININE 3.42 (H) 01/19/2021   CREATININE 3.94 (H) 01/18/2021      Objective:  Vital signs in last 24 hours:  Temp:  [96.9 F (36.1 C)-98.3 F (36.8 C)] 96.9 F (36.1 C) (02/09 0400) Pulse Rate:  [57-113] 70 (02/09 0630) Resp:  [11-25] 25 (02/09 0630) BP: (94-201)/(57-145) 108/60 (02/09 0630) SpO2:  [97 %-100 %] 100 % (02/09 0732) FiO2 (%):  [24 %-60 %] 24 % (02/09 0732)  Weight change:  Filed Weights   01/17/21 0500 01/18/21 0345 01/19/21 0500  Weight: (!) 195.5 kg (!) 195 kg (!) 192.8 kg    Intake/Output:    Intake/Output Summary (Last 24 hours) at 01/20/2021 0945 Last data filed at 01/20/2021 0523 Gross per 24 hour  Intake 1875.54 ml  Output 2940 ml  Net -1064.46 ml    Physical Exam: General: Critically ill-appearing  HEENT ETT in place  Lungs: Vent assisted  Heart:: Regular  Abdomen: Mild distension  Extremities: no edema  Neurologic: sedated  Skin: No acute rash  Access: left IJ temp cath  Foley: Present      Basic Metabolic Panel:  Recent Labs  Lab 01/15/21 0402 01/15/21 1549 01/16/21 0419 01/16/21 1621 01/17/21 1536 01/18/21 0346 01/18/21 1618 01/19/21 0409 01/19/21 1551 01/20/21 0423 01/20/21 0424  NA 144   < > 139   < > 136 135 135  --  137  --  138  K 4.5   < > 4.4   < > 3.7 3.6 3.7  --  3.4*  --  2.8*  CL 113*   < > 105   < > 104 102 102  --  102  --  103  CO2 23   < > 22   < > 21* 23 24  --  22  --  23  GLUCOSE 213*   < > 256*   < > 249* 269* 289*  --  200*  --  135*  BUN 80*   < > 74*   < > 65* 66* 68*  --   57*  --  51*  CREATININE 6.56*   < > 5.83*   < > 4.35* 4.24* 3.94*  --  3.42*  --  3.37*  CALCIUM 7.5*   < > 7.8*   < > 8.0* 8.3* 8.5*  --  8.7*  --  8.2*  MG 2.8*  --  2.4  --   --  2.0  --  1.9  --  2.1  --   PHOS 7.8*   < > 7.4*   < > 4.9* 5.7* 5.9*  --  5.2*  --  5.0*   < > = values in this interval not displayed.     CBC: Recent Labs  Lab 01/15/21 0402 01/16/21 0419 01/17/21 0433 01/18/21 0346 01/19/21 0409 01/20/21 0423  WBC 15.0* 14.3* 10.4 16.1* 18.3* 15.0*  NEUTROABS 12.1* 10.7* 7.2  --  10.6* 8.7*  HGB 11.5* 10.6* 9.9* 10.0* 9.3* 8.6*  HCT 39.3 35.0* 30.6* 32.2* 28.8* 27.4*  MCV 87.9 85.6 81.4 82.6 81.6 82.5  PLT 205 143* 111* 123*  121* 136*     No results found for: HEPBSAG, HEPBSAB, HEPBIGM    Microbiology:  Recent Results (from the past 240 hour(s))  Blood culture (routine single)     Status: None   Collection Time: 01/12/21  9:05 AM   Specimen: BLOOD  Result Value Ref Range Status   Specimen Description BLOOD LEFT AC  Final   Special Requests   Final    BOTTLES DRAWN AEROBIC ONLY Blood Culture results may not be optimal due to an inadequate volume of blood received in culture bottles   Culture   Final    NO GROWTH 5 DAYS Performed at [facility], [address] [place], [place] [address]    Report Status 01/17/2021 FINAL  Final  SARS Coronavirus 2 by RT PCR (hospital order, performed in [place] hospital lab) Nasopharyngeal Nasopharyngeal Swab     Status: Abnormal   Collection Time: 01/12/21  9:36 AM   Specimen: Nasopharyngeal Swab  Result Value Ref Range Status   SARS Coronavirus 2 POSITIVE (A) NEGATIVE Final    Comment: RESULT CALLED TO, READ BACK BY AND VERIFIED WITH: ANNA JASPER ON 01/12/21 AT 1111 SDR (NOTE) SARS-CoV-2 target nucleic acids are DETECTED  SARS-CoV-2 RNA is generally detectable in upper respiratory specimens  during the acute phase of infection.  Positive results are indicative  of the presence of the identified  virus, but do not rule out bacterial infection or co-infection with other pathogens not detected by the test.  Clinical correlation with patient history and  other diagnostic information is necessary to determine patient infection status.  The expected result is negative.  Fact Sheet for Patients:   StrictlyIdeas.no   Fact Sheet for Healthcare Providers:   BankingDealers.co.za    This test is not yet approved or cleared by the [place] FDA and  has been authorized for detection and/or diagnosis of SARS-CoV-2 by FDA under an Emergency Use Authorization (EUA).  This EUA will remain in effect (meaning this tes t can be used) for the duration of  the COVID-19 declaration under Section 564(b)(1) of the Act, 21 U.S.C. section 360-bbb-3(b)(1), unless the authorization is terminated or revoked sooner.  Performed at [facility], [address] [place], [place] [address]   Culture, blood (single)     Status: None   Collection Time: 01/12/21  1:08 PM   Specimen: BLOOD  Result Value Ref Range Status   Specimen Description BLOOD BLOOD RIGHT HAND  Final   Special Requests   Final    BOTTLES DRAWN AEROBIC AND ANAEROBIC Blood Culture adequate volume   Culture   Final    NO GROWTH 5 DAYS Performed at [facility], [address] [place], [place] [address]    Report Status 01/17/2021 FINAL  Final  MRSA PCR Screening     Status: None   Collection Time: 01/12/21  7:22 PM   Specimen: Nasopharyngeal  Result Value Ref Range Status   MRSA by PCR NEGATIVE NEGATIVE Final    Comment:        The GeneXpert MRSA Assay (FDA approved for NASAL specimens only), is one component of a comprehensive MRSA colonization surveillance program. It is not intended to diagnose MRSA infection nor to guide or monitor treatment for MRSA infections. Performed at [facility], [address] [place], [place] [address]   Culture,  respiratory (non-expectorated)     Status: None   Collection Time: 01/13/21  3:04 PM   Specimen: Tracheal Aspirate; Respiratory  Result Value Ref Range Status   Specimen Description   Final    TRACHEAL ASPIRATE Performed at [facility], [address] [place], [place] [address]    Special Requests   Final    NONE Performed at [facility], [address] [place], [place] [address]    Gram Stain   Final    ABUNDANT WBC PRESENT, PREDOMINANTLY PMN MODERATE GRAM POSITIVE COCCI Performed at [facility], [place] [address] [place], [place] [address]    Culture FEW STAPHYLOCOCCUS AUREUS  Final   Report Status 01/16/2021 FINAL  Final   Organism ID, Bacteria STAPHYLOCOCCUS AUREUS  Final      Susceptibility   Staphylococcus aureus - MIC*    CIPROFLOXACIN <=0.5 SENSITIVE Sensitive     ERYTHROMYCIN <=0.25 SENSITIVE Sensitive     GENTAMICIN <=0.5 SENSITIVE Sensitive     OXACILLIN 0.5 SENSITIVE Sensitive     TETRACYCLINE <=1 SENSITIVE Sensitive     VANCOMYCIN 1 SENSITIVE Sensitive     TRIMETH/SULFA <=10 SENSITIVE Sensitive     CLINDAMYCIN <=0.25 SENSITIVE Sensitive     RIFAMPIN <=0.5 SENSITIVE Sensitive     Inducible Clindamycin NEGATIVE Sensitive     * FEW STAPHYLOCOCCUS AUREUS    Coagulation Studies: No results for input(s): LABPROT, INR in the last 72 hours.  Urinalysis: No results for input(s): COLORURINE, LABSPEC, PHURINE, GLUCOSEU, HGBUR, BILIRUBINUR, KETONESUR, PROTEINUR, UROBILINOGEN, NITRITE, LEUKOCYTESUR in the last 72 hours.  Invalid input(s): APPERANCEUR    Imaging: DG Abd 1 View  Result Date: 01/19/2021 CLINICAL DATA:  Ileus.  Reported COVID-19 pneumonitis EXAM: ABDOMEN - 1 VIEW COMPARISON:  January 13, 2021. FINDINGS: The nasogastric tube tip in region of pylorus. Right femoral catheter tip is at the L4 level. Apparent rectal thermometer present. There is a relative paucity of bowel gas. No bowel dilatation or air-fluid levels. No free air  evident on supine examination. Airspace opacity left base, incompletely visualized. IMPRESSION: Nasogastric tube tip at gastric pylorus level. Paucity of bowel gas may be indicative of ileus or enteritis. Bowel obstruction felt to be less likely. No free air evident. Electronically Signed   By: Lowella Grip III M.D.   On: 01/19/2021 11:31   CARDIAC CATHETERIZATION  Result Date: 01/20/2021 Conclusions: 1. Minimal coronary artery plaquing without angiographically significant coronary artery disease. 2. Moderately reduced left ventricular contraction with global hypokinesis. 3. Mildly elevated left ventricular filling pressure (LVEDP ~20 mmHg). Recommendations: 1. No obvious coronary artery lesion to explain ST segment changes and sustained ventricular tachycardia.  Possible causes include metabolic/electrolyte derangements and myocarditis in the setting of COVID-19. 2. Trend HS-TnI until it has peaked, then stop. 3. Obtain transthoracic echocardiogram. 4. If blood pressure tolerates, consider addition of low-dose beta-blocker. 5. If patient has recurrent VT, consider IV amiodarone. 6. Replete electrolytes to maintain K > 4.0 and Mg > 2.0. Nelva Bush, MD [facility]   DG Chest Port 1 View  Result Date: 01/19/2021 CLINICAL DATA:  Acute respiratory failure EXAM: PORTABLE CHEST 1 VIEW COMPARISON:  01/15/2021 FINDINGS: The endotracheal tube terminates above the carina. The left-sided dialysis catheter is well position. The enteric tube terminates below the left hemidiaphragm. The lung volumes are low. There is no pneumothorax. There are findings of volume overload/vascular congestion. There is bibasilar atelectasis with probable small bilateral pleural effusions. IMPRESSION: Lines and tubes as above. Low lung volumes with vascular congestion and bibasilar atelectasis. Electronically Signed   By: Constance Holster M.D.   On: 01/19/2021 01:36  Medications:   .  prismasol BGK 4/2.5 Stopped  (01/20/21 0530)  .  prismasol BGK 4/2.5 Stopped (01/20/21 0530)  . sodium chloride 5 mL/hr at 01/20/21 0523  . sodium chloride    . dexmedetomidine (PRECEDEX) IV infusion 0.8 mcg/kg/hr (01/20/21 0541)  . dextrose    . famotidine (PEPCID) IV 20 mg (01/20/21 0941)  . fentaNYL infusion INTRAVENOUS 325 mcg/hr (01/20/21 0523)  . midazolam 2 mg/hr (01/20/21 0174)  . norepinephrine (LEVOPHED) Adult infusion Stopped (01/20/21 0502)  . potassium chloride 10 mEq (01/20/21 0920)  . prismasol BGK 4/2.5 Stopped (01/20/21 0530)   . aspirin  81 mg Per Tube Daily  . [START ON 01/21/2021] atorvastatin  80 mg Per Tube Daily  . chlorhexidine gluconate (MEDLINE KIT)  15 mL Mouth Rinse BID  . Chlorhexidine Gluconate Cloth  6 each Topical Daily  . docusate  100 mg Per Tube BID  . feeding supplement (VITAL 1.5 CAL)  1,000 mL Per Tube Q24H  . heparin  5,000 Units Subcutaneous Q8H  . insulin aspart  0-20 Units Subcutaneous Q4H  . insulin aspart  5 Units Subcutaneous Q4H  . insulin detemir  20 Units Subcutaneous QHS  . insulin detemir  40 Units Subcutaneous Daily  . mouth rinse  15 mL Mouth Rinse 10 times per day  . methylPREDNISolone (SOLU-MEDROL) injection  40 mg Intravenous Q24H  . metoCLOPramide  5 mg Per Tube Q8H  . multivitamin  1 tablet Per Tube QHS  . polyethylene glycol  17 g Per Tube Daily  . sodium chloride flush  3 mL Intravenous Q12H  . sodium chloride flush  3 mL Intravenous Q12H   sodium chloride, sodium chloride, acetaminophen (TYLENOL) oral liquid 160 mg/5 mL, dextrose, docusate sodium, fentaNYL, heparin, hydrALAZINE, midazolam, midazolam, ondansetron **OR** ondansetron (ZOFRAN) IV, ondansetron (ZOFRAN) IV, polyethylene glycol, sodium chloride flush, sodium chloride flush  Assessment/ Plan:  30 y.o. male with h/o Asthma, no recent medical care , was  admitted on 01/12/2021 for ARDS (adult respiratory distress syndrome) ([place]) [J80] Acute respiratory failure with hypoxia ([place]) [J96.01] Acute  hypoxemic respiratory failure due to COVID-19 ([facility]) [U07.1, J96.01] Hyperosmolar hyperglycemic state (HHS) ([place]) [E11.00, E11.65] Pneumonia due to COVID-19 virus [U07.1, J12.82]  # AKI Baseline creatinine is unknown U/a- 6-10 RBCs, 11-20 WBCs, UPC of 0.24 US renal - unremarkable, no obstruction 02/08 0701 - 02/09 0700 In: 2201.7 [I.V.:1726.9; NG/GT:424.8; IV Piggyback:50] Out: 9449 [Urine:515; Emesis/NG output:300; Stool:100] -Urine output 515 cc over the preceding 24 hours.  Still not enough to sustain to be off of dialysis.  We will plan for continued CRRT with ultrafiltration target of 50 cc/h.  # hypernatremia -Sodium 138.  Continue to monitor serum sodium.   # New Dx of Diabetes, type 1 uncontrolled Lab Results  Component Value Date   HGBA1C 13.6 (H) 01/12/2021  Markedly elevated hemoglobin A1c as documented above.  Insulin management as per pulmonary/critical care.  # Acute respiratory failure # Covid 19-infection Weaning as per pulmonary/critical care.  #Hypokalemia. Potassium 2.8.  We will replete per protocol.     LOS: 8 Erik [name] 2/9/20229:45 AM  [place], [place]  Note: This note was prepared with Dragon dictation. Any transcription errors are unintentional

## 2021-01-20 NOTE — Progress Notes (Signed)
Pt transported to Cath lab on the vent without incident and is tol well at this time, RT remains at bedside.

## 2021-01-20 NOTE — Significant Event (Signed)
Pts EKG revealed acute MI with ST elevation in leads aVR, II, III, VI, V4, V5, and V6 therefore activated Code STEMI.  Case discussed with Dr. Okey Dupre via telephone, and he stated he would proceed with emergent cardiac catheterization.  Spoke with pts mother Osamah Schmader via telephone to inform her the pt has had an acute MI and per Cardiologist recommending cardiac catheterization as a life saving measure.  However, due to the need for contrast dye during cardiac catheterization it is likely the pts oliguric acute renal failure will worsen resulting in ESRD.  Following discussion of risk/benefits pt mother consented to proceeding with cardiac catheterization all questions were answered.  Will continue to monitor and assess pt.  Sonda Rumble, AGNP  Pulmonary/Critical Care Pager [phone] (please enter 7 digits) PCCM Consult Pager [phone] (please enter 7 digits)

## 2021-01-20 NOTE — Progress Notes (Signed)
Neuro: stable of sedation, responds to commands, nods appropriately Resp: stable of ventilator CV: afebrile, run of VTach, code STEMI, went to cath lab GIGU: flexiseal, foley, OG present and tolerated well, Feeds turned off at approximately 0515 due to code STEMI Skin: long term moisture associated breakdown present within buttocks and groin, only present where skin to skin contact is made Social: video call with Mom this evening, update given over the video chat  Events: 4 minute run of VTach, ECG x 3 done, trop drawn, code STEMI called, patient taken to cath lab, handed off to [employer]

## 2021-01-20 NOTE — Therapy (Signed)
Assisted with transport with patient on the triology from the Cath Lab back to ICU#20.  Triology settings AC/540/40%/rr-25/+5PEEP.  No issues with transport. Pt placed back on the servo-I on previous settings.

## 2021-01-20 NOTE — Progress Notes (Signed)
Inpatient Diabetes Program Recommendations  AACE/ADA: New Consensus Statement on Inpatient Glycemic Control  Target Ranges:  Prepandial:   less than 140 mg/dL      Peak postprandial:   less than 180 mg/dL (1-2 hours)      Critically ill patients:  140 - 180 mg/dL   Results for Erik [name], Erik [name] (MRN [id number]) as of 01/20/2021 07:35  Ref. Range 01/19/2021 07:44 01/19/2021 11:19 01/19/2021 15:31 01/19/2021 19:37 01/19/2021 23:19 01/20/2021 03:30  Glucose-Capillary Latest Ref Range: 70 - 99 mg/dL 914 (H) 782 (H) 956 (H) 163 (H) 128 (H) 101 (H)   Review of Glycemic Control  Diabetes history: No Outpatient Diabetes medications: NA Current orders for Inpatient glycemic control: Levemir 40 units daily at 10am, Levemir 20 units QHS, Novolog 0-20 units Q4H, Novolog 5 units Q4H; Solumedrol 40 mg Q24H, Vital @ 15 ml/hr  Inpatient Diabetes Program Recommendations:    Insulin: If tube feedings are continued as currently ordered please consider decreasing tube feeding coverage to Novolog 2 units Q4H.    NOTE: Noted tube feedings decreased to 15 ml/hr, steroids decreased, code STEMI this am, and patient is currently in cath lab.   Thanks, Orlando Penner, RN, MSN, CDE Diabetes Coordinator Inpatient Diabetes Program [phone] (Team Pager from 8am to 5pm)

## 2021-01-20 NOTE — Progress Notes (Signed)
ANTICOAGULATION CONSULT NOTE  Pharmacy Consult for Heparin Infusion Indication: ACS/STEMI  No Known Allergies  Patient Measurements: Height: 6' (182.9 cm) Weight: (!) 192.8 kg (425 lb) IBW/kg (Calculated) : 77.6 Heparin Dosing Weight: 115.8 kg  Vital Signs: Temp: 98.6 F (37 C) (02/09 1300) Temp Source: Bladder (02/09 1200) BP: 105/68 (02/09 1300) Pulse Rate: 86 (02/09 1300)  Labs: Recent Labs    01/17/21 2006 01/18/21 0346 01/19/21 0409 01/19/21 1551 01/20/21 0423 01/20/21 0424 01/20/21 0518 01/20/21 1003  HGB  --    < > 9.3*  --  8.6*  --   --  9.0*  HCT  --    < > 28.8*  --  27.4*  --   --  28.6*  PLT  --    < > 121*  --  136*  --   --  138*  CREATININE  --    < >  --  3.42*  --  3.37*  --  3.59*  TROPONINIHS 11  --   --   --   --   --  47* 4,189*   < > = values in this interval not displayed.    Estimated Creatinine Clearance: 53.1 mL/min (A) (by C-G formula based on SCr of 3.59 mg/dL (H)).   Medical History: Past Medical History:  Diagnosis Date  . Asthma     Assessment: Pt is 30 yo male admitted on 2/1 for Acute hypoxemic respiratory failure due to COVID. Patient taken to cath lab for STEMI. Consult for heparin drip but it does not appear heparin drip was ever started. Consult to continue heparin for 48 hours.  Goal of Therapy:  Heparin level 0.3-0.7 units/ml Monitor platelets by anticoagulation protocol: Yes   Plan:  Heparin 4000 unit bolus followed by heparin drip at 1600 units/hr. Check HL at 2300. CBC daily while on heparin drip.  Laureen Ochs, PharmD Clinical Pharmacist 01/20/2021 1:56 PM

## 2021-01-20 NOTE — Progress Notes (Signed)
ANTICOAGULATION CONSULT NOTE  Pharmacy Consult for Heparin Infusion Indication: ACS/STEMI  No Known Allergies  Patient Measurements: Height: 6' (182.9 cm) Weight: (!) 192.8 kg (425 lb) IBW/kg (Calculated) : 77.6 Heparin Dosing Weight: 115.8 kg  Vital Signs: Temp: 96.9 F (36.1 C) (02/09 0400) Temp Source: Bladder (02/09 0400) BP: 108/60 (02/09 0630) Pulse Rate: 70 (02/09 0630)  Labs: Recent Labs    01/17/21 1536 01/17/21 1729 01/17/21 2006 01/18/21 0346 01/18/21 1618 01/19/21 0409 01/19/21 1551 01/20/21 0423 01/20/21 0424 01/20/21 0518  HGB   < >  --   --  10.0*  --  9.3*  --  8.6*  --   --   HCT  --   --   --  32.2*  --  28.8*  --  27.4*  --   --   PLT  --   --   --  123*  --  121*  --  136*  --   --   CREATININE  --   --   --  4.24* 3.94*  --  3.42*  --  3.37*  --   TROPONINIHS  --  13 11  --   --   --   --   --   --  47*   < > = values in this interval not displayed.    Estimated Creatinine Clearance: 56.6 mL/min (A) (by C-G formula based on SCr of 3.37 mg/dL (H)).   Medical History: Past Medical History:  Diagnosis Date  . Asthma     Medications:  Pt receiving heparinized saline for CRRT. Pt on heparin prophylaxis prior to consult, last dose 2/8 @ 2229.  Assessment: Pt is 30 yo male admitted on 2/1 for Acute hypoxemic respiratory failure due to COVID.  Pt transitioned to heparin infusion following episode of VTach and code STEMI.  Goal of Therapy:  Heparin level 0.3-0.7 units/ml Monitor platelets by anticoagulation protocol: Yes   Plan:  No bolus given due to prior heparin prophylaxis. Started heparin infusion at 1600 units/hr Will check HL 6 hours after start of infusion Will continue to check H&H and Plts daily.  Otelia Sergeant, PharmD, [facility] 01/20/2021 8:17 AM

## 2021-01-20 NOTE — Consult Note (Signed)
Cardiology Consultation:   Patient ID: Erik [name] MRN: [id number]; DOB: 1991-09-17  Admit date: 01/12/2021 Date of Consult: 01/20/2021  Primary Care Provider: Pcp, No [facility] Cardiologist: New - Rishi Vicario [facility] Electrophysiologist:  None    Patient Profile:   Erik [name] is a 30 y.o. male with a hx of asthma and morbid obesity hospitalized with [id number] pneumonia complicated by acute kidney injury, who is being seen today for the evaluation of STEMI at the request of Dr. Patsey Berthold.  History of Present Illness:   Mr. Gee was admitted to [facility] on 01/12/2021 with 2 weeks of lethargy, weakness, and poor oral intake accompanied by nausea and vomiting.  He was found to be in DKA with a fasting blood sugar greater than 700.  He was emergently intubated by the ER physician in the setting of severe acidosis and acute respiratory failure with hypoxia.  He was placed on CRRT, which has continued.  Early this morning, ST segment changes were noted on telemetry.  EKG showed new inferolateral ST segment elevation.  The patient also made vague indications to the nurse that his chest was hurting or that he was short of breath.  He is currently intubated and sedated, unable to answer questions.  He was transiently hypotensive requiring norepinephrine, though this was discontinued after he had an episode of ventricular tachycardia lasting 3 minutes at 5 AM.  He did not receive defibrillation and has been maintaining sinus rhythm since then.   Past Medical History:  Diagnosis Date  . Asthma     History reviewed. No pertinent surgical history.   Home Medications:  Prior to Admission medications   Not on File    Inpatient Medications: Scheduled Meds: . [MAR Hold] chlorhexidine gluconate (MEDLINE KIT)  15 mL Mouth Rinse BID  . [MAR Hold] Chlorhexidine Gluconate Cloth  6 each Topical Daily  . [MAR Hold] docusate  100 mg Per Tube BID  . [MAR Hold] feeding supplement (VITAL 1.5 CAL)  1,000 mL  Per Tube Q24H  . [MAR Hold] insulin aspart  0-20 Units Subcutaneous Q4H  . [MAR Hold] insulin aspart  5 Units Subcutaneous Q4H  . [MAR Hold] insulin detemir  20 Units Subcutaneous QHS  . [MAR Hold] insulin detemir  40 Units Subcutaneous Daily  . [MAR Hold] mouth rinse  15 mL Mouth Rinse 10 times per day  . [MAR Hold] methylPREDNISolone (SOLU-MEDROL) injection  40 mg Intravenous Q24H  . [MAR Hold] metoCLOPramide  5 mg Per Tube Q8H  . [MAR Hold] multivitamin  1 tablet Per Tube QHS  . [MAR Hold] polyethylene glycol  17 g Per Tube Daily  . [MAR Hold] sodium chloride flush  3 mL Intravenous Q12H   Continuous Infusions: .  prismasol BGK 4/2.5 200 mL/hr at 01/20/21 0226  .  prismasol BGK 4/2.5 200 mL/hr at 01/20/21 0226  . [MAR Hold] sodium chloride 5 mL/hr at 01/20/21 0523  . [MAR Hold] dexmedetomidine (PRECEDEX) IV infusion 0.8 mcg/kg/hr (01/20/21 0541)  . dextrose    . [MAR Hold] famotidine (PEPCID) IV Stopped (01/19/21 0835)  . fentaNYL infusion INTRAVENOUS 325 mcg/hr (01/20/21 0523)  . heparin    . midazolam 2 mg/hr (01/20/21 8329)  . [MAR Hold] norepinephrine (LEVOPHED) Adult infusion Stopped (01/20/21 0502)  . [MAR Hold] potassium chloride    . prismasol BGK 4/2.5 1,000 mL/hr at 01/20/21 0410   PRN Meds: [MAR Hold] sodium chloride, [MAR Hold] acetaminophen (TYLENOL) oral liquid 160 mg/5 mL, [MAR Hold] dextrose, [MAR Hold] docusate  sodium, [MAR Hold] fentaNYL, [MAR Hold] heparin, [MAR Hold] heparin, [MAR Hold] midazolam, [MAR Hold] midazolam, [MAR Hold] ondansetron **OR** [MAR Hold] ondansetron (ZOFRAN) IV, [MAR Hold] ondansetron (ZOFRAN) IV, [MAR Hold] polyethylene glycol, [MAR Hold] sodium chloride flush  Allergies:   No Known Allergies  Social History:   Social History   Tobacco Use  . Smoking status: Never Smoker  . Smokeless tobacco: Never Used  Substance Use Topics  . Alcohol use: Never  . Drug use: Never     Family History:   Unable to obtain as the patient is  intubated and critically ill.  ROS:  Unable to obtain as the patient is intubated and critically ill.  Physical Exam/Data:   Vitals:   01/20/21 0400 01/20/21 0500 01/20/21 0505 01/20/21 0540  BP: (!) 103/57 (!) 201/145 (!) 174/103 102/63  Pulse: 75 80 78 64  Resp: (!) 25 (!) 23 (!) 25 (!) 25  Temp: (!) 96.9 F (36.1 C)     TempSrc: Bladder     SpO2: 99% 100% 100% 100%  Weight:      Height:        Intake/Output Summary (Last 24 hours) at 01/20/2021 0647 Last data filed at 01/20/2021 0523 Gross per 24 hour  Intake 2281.2 ml  Output 3450 ml  Net -1168.8 ml   Last 3 Weights 01/19/2021 01/18/2021 01/17/2021  Weight (lbs) 425 lb 430 lb 431 lb  Weight (kg) 192.779 kg 195.047 kg 195.5 kg     Body mass index is 57.64 kg/m.  General: Morbidly obese man lying in bed.  He is intubated and does not respond to questions. HEENT: Endotracheal tube in place. Lymph: no adenopathy Neck: Unable to assess JVP due to body habitus.  Left-sided temporary dialysis catheter in place. Endocrine:  No thryomegaly Vascular: 2+ femoral and radial pulses bilaterally.  Right femoral dialysis catheter in place. Cardiac: Distant heart sounds.  Regular rate and rhythm without murmurs. Lungs: Coarse breath sounds anteriorly. Abd: soft, nontender, no hepatomegaly  Ext: 1+ leg edema. Musculoskeletal:  No deformities Skin: warm and dry  Neuro: Sedated and does not follow commands. Psych: Unable to assess.  EKG:  The EKG was personally reviewed and demonstrates: Normal sinus rhythm with inferolateral ST elevation. Telemetry:  Telemetry was personally reviewed and demonstrates: Sinus rhythm with PVCs.  3-minute episode of sustained ventricular tachycardia at 5 AM.  Relevant CV Studies: None.  Laboratory Data:  High Sensitivity Troponin:   Recent Labs  Lab 01/15/21 0734 01/17/21 1536 01/17/21 1729 01/17/21 2006 01/20/21 0518  TROPONINIHS 42* '12 13 11 ' 47*     Chemistry Recent Labs  Lab 01/18/21 1618  01/19/21 1551 01/20/21 0424  NA 135 137 138  K 3.7 3.4* 2.8*  CL 102 102 103  CO2 '24 22 23  ' GLUCOSE 289* 200* 135*  BUN 68* 57* 51*  CREATININE 3.94* 3.42* 3.37*  CALCIUM 8.5* 8.7* 8.2*  GFRNONAA 20* 24* 24*  ANIONGAP '9 13 12    ' Recent Labs  Lab 01/15/21 0402 01/15/21 1549 01/16/21 0419 01/16/21 1621 01/17/21 0433 01/17/21 1536 01/18/21 1618 01/19/21 1551 01/20/21 0424  PROT 5.8*  --  5.3*  --  5.4*  --   --   --   --   ALBUMIN 1.8*   < > 1.7*   < > 1.7*   < > 2.0* 2.1* 1.9*  AST 30  --  30  --  26  --   --   --   --   ALT  17  --  20  --  21  --   --   --   --   ALKPHOS 66  --  71  --  74  --   --   --   --   BILITOT 0.5  --  0.9  --  0.6  --   --   --   --    < > = values in this interval not displayed.   Hematology Recent Labs  Lab 01/18/21 0346 01/19/21 0409 01/20/21 0423  WBC 16.1* 18.3* 15.0*  RBC 3.90* 3.53* 3.32*  HGB 10.0* 9.3* 8.6*  HCT 32.2* 28.8* 27.4*  MCV 82.6 81.6 82.5  MCH 25.6* 26.3 25.9*  MCHC 31.1 32.3 31.4  RDW 14.4 14.6 14.1  PLT 123* 121* 136*   BNP Recent Labs  Lab 01/17/21 1536  BNP 60.4    DDimer No results for input(s): DDIMER in the last 168 hours.   Radiology/Studies:  DG Abd 1 View  Result Date: 01/19/2021 CLINICAL DATA:  Ileus.  Reported COVID-19 pneumonitis EXAM: ABDOMEN - 1 VIEW COMPARISON:  January 13, 2021. FINDINGS: The nasogastric tube tip in region of pylorus. Right femoral catheter tip is at the L4 level. Apparent rectal thermometer present. There is a relative paucity of bowel gas. No bowel dilatation or air-fluid levels. No free air evident on supine examination. Airspace opacity left base, incompletely visualized. IMPRESSION: Nasogastric tube tip at gastric pylorus level. Paucity of bowel gas may be indicative of ileus or enteritis. Bowel obstruction felt to be less likely. No free air evident. Electronically Signed   By: Lowella Grip III M.D.   On: 01/19/2021 11:31   DG Chest Port 1 View  Result Date:  01/19/2021 CLINICAL DATA:  Acute respiratory failure EXAM: PORTABLE CHEST 1 VIEW COMPARISON:  01/15/2021 FINDINGS: The endotracheal tube terminates above the carina. The left-sided dialysis catheter is well position. The enteric tube terminates below the left hemidiaphragm. The lung volumes are low. There is no pneumothorax. There are findings of volume overload/vascular congestion. There is bibasilar atelectasis with probable small bilateral pleural effusions. IMPRESSION: Lines and tubes as above. Low lung volumes with vascular congestion and bibasilar atelectasis. Electronically Signed   By: Constance Holster M.D.   On: 01/19/2021 01:36     Assessment and Plan:   Inferior STEMI: Unable to assess if patient is having any symptoms but new EKG changes are notable with inferolateral ST elevation.  Patient also had episode of sustained ventricular tachycardia concerning for ischemia.  He is critically ill with high risk of morbidity and mortality.  I have spoken with his mother by phone, and she wishes for [place] to proceed with emergent cardiac catheterization.  I have reviewed the risks, indications, and alternatives to cardiac catheterization, possible angioplasty, and stenting with the patient. Risks include but are not limited to bleeding, infection, vascular injury, stroke, myocardial infection, arrhythmia, kidney injury, radiation-related injury in the case of prolonged fluoroscopy use, emergency cardiac surgery, and death. The patient understands the risks of serious complication is 1-2 in 7253 with diagnostic cardiac cath and 1-2% or less with angioplasty/stenting.  Ventricular tachycardia: Most likely related to acute MI but potentially also due to hypokalemia.  Potassium was 2.8 this morning with potassium supplementation ordered by CCM.  Defer adding a beta-blocker in the setting of intermittent hypotension requiring vasopressors.  If patient has recurrent ectopy, will need to consider initiation of  amiodarone.  Echocardiogram to be ordered following catheterization.  COVID-19 pneumonia:  Continue vent management and supportive care per CCM.  Acute kidney injury: Patient has been receiving CRRT.  High likelihood for permanent renal failure with IV contrast.  Risks and benefits of catheterization discussed with his mother, who wishes that we proceed knowing high risk for CIN.  Diabetes mellitus: New diagnosis this admission with presenting glucose greater than 700.  Hemoglobin A1c was 13.6.  Anemia: Initial significant drop in hemoglobin when patient was admitted with gradual downward trend.  No report of active bleeding.  Will need to monitor closely, particularly if PCI is performed and patient is on antiplatelet therapy.   Risk Assessment/Risk Scores:     TIMI Risk Score for ST  Elevation MI:   The patient's TIMI risk score is 7, which indicates a 23.4% risk of all cause mortality at 30 days.  Overall mortality is likely higher in the setting of his other comorbidities.    For questions or updates, please contact [place] Please consult [email or web address] for contact info under [facility].   Signed, Nelva Bush, MD  01/20/2021 6:47 AM

## 2021-01-20 NOTE — Progress Notes (Signed)
CRITICAL CARE NOTE  30 yo morbidly obese male with ASTHMA now with progressive multiorgan failure with COVID 19 pneumonia and ARDS with severe acidosis and severe hypoxia with ARDS with acute renal failure  2/1 Emergently intubated 2/2 severe resp failure, vasc cath placed 2/3 severe resp failure 01/15/2021- patient is critically Ill, CRRT continued.  Borderline low blood glucose today.   01/16/2021- patient with thickened heavy ETT secretions.  RN reporting inadequate sedation with patient making motions to remove airway, we have advanced RASS to range -3 to -4.  Remains critically ill with ongoing renal replacement therapy and COVID treatment.   01/17/2021- patient had thickened secretions again today, he was lavaged with saline by RT today with good response. We will try awakening trial again in AM.  01/18/2021-remains on vent, on CRRT 01/19/2021-acute MI 2/9 s/p cath, multiorgan failure   CC  follow up respiratory failure  SUBJECTIVE Patient remains critically ill Prognosis is guarded  Vent Mode: AC FiO2 (%):  [24 %-60 %] 60 % Set Rate:  [25 bmp] 25 bmp Vt Set:  [540 mL-550 mL] 540 mL PEEP:  [5 cmH20] 5 cmH20 CBC    Component Value Date/Time   WBC 15.0 (H) 01/20/2021 0423   RBC 3.32 (L) 01/20/2021 0423   HGB 8.6 (L) 01/20/2021 0423   HCT 27.4 (L) 01/20/2021 0423   PLT 136 (L) 01/20/2021 0423   MCV 82.5 01/20/2021 0423   MCH 25.9 (L) 01/20/2021 0423   MCHC 31.4 01/20/2021 0423   RDW 14.1 01/20/2021 0423   LYMPHSABS 2.5 01/20/2021 0423   MONOABS 1.6 (H) 01/20/2021 0423   EOSABS 0.1 01/20/2021 0423   BASOSABS 0.1 01/20/2021 0423   BMP Latest Ref Rng & Units 01/20/2021 01/19/2021 01/18/2021  Glucose 70 - 99 mg/dL 681(L) 572(I) 203(T)  BUN 6 - 20 mg/dL 59(R) 41(U) 38(G)  Creatinine 0.61 - 1.24 mg/dL 5.36(I) 6.80(H) 2.12(Y)  Sodium 135 - 145 mmol/L 138 137 135  Potassium 3.5 - 5.1 mmol/L 2.8(L) 3.4(L) 3.7  Chloride 98 - 111 mmol/L 103 102 102  CO2 22 - 32 mmol/L 23 22 24    Calcium 8.9 - 10.3 mg/dL 8.2(L) 8.7(L) 8.5(L)      BP 108/60   Pulse 70   Temp (!) 96.9 F (36.1 C) (Bladder)   Resp (!) 25   Ht 6' (1.829 m)   Wt (!) 192.8 kg   SpO2 98%   BMI 57.64 kg/m    I/O last 3 completed shifts: In: 3538.4 [I.V.:2732.6; Other:46; NG/GT:459.8; IV Piggyback:300] Out: 5493 [Urine:740; Emesis/NG output:825; Other:3728; Stool:200] No intake/output data recorded.  SpO2: 98 % O2 Flow Rate (L/min): 7 L/min FiO2 (%): 60 %  Estimated body mass index is 57.64 kg/m as calculated from the following:   Height as of this encounter: 6' (1.829 m).   Weight as of this encounter: 192.8 kg.  SIGNIFICANT EVENTS   REVIEW OF SYSTEMS  PATIENT IS UNABLE TO PROVIDE COMPLETE REVIEW OF SYSTEMS DUE TO SEVERE CRITICAL ILLNESS       PHYSICAL EXAMINATION: GENERAL:critically ill appearing, +resp distress NECK: Supple.  PULMONARY: +rhonchi, +wheezing CARDIOVASCULAR: S1 and S2.  GASTROINTESTINAL: Soft, nontender, +Positive bowel sounds.  MUSCULOSKELETAL: No swelling, clubbing, or edema.  NEUROLOGIC: obtunded, GCS<8 SKIN:intact,warm,dry     MEDICATIONS: I have reviewed all medications and confirmed regimen as documented   CULTURE RESULTS   Recent Results (from the past 240 hour(s))  Blood culture (routine single)     Status: None   Collection Time: 01/12/21  9:05  AM   Specimen: BLOOD  Result Value Ref Range Status   Specimen Description BLOOD LEFT AC  Final   Special Requests   Final    BOTTLES DRAWN AEROBIC ONLY Blood Culture results may not be optimal due to an inadequate volume of blood received in culture bottles   Culture   Final    NO GROWTH 5 DAYS Performed at [facility], [address] [place], [place] [address]    Report Status 01/17/2021 FINAL  Final  SARS Coronavirus 2 by RT PCR (hospital order, performed in [facility] hospital lab) Nasopharyngeal Nasopharyngeal Swab     Status: Abnormal   Collection Time: 01/12/21  9:36 AM    Specimen: Nasopharyngeal Swab  Result Value Ref Range Status   SARS Coronavirus 2 POSITIVE (A) NEGATIVE Final    Comment: RESULT CALLED TO, READ BACK BY AND VERIFIED WITH: ANNA JASPER ON 01/12/21 AT 1111 SDR (NOTE) SARS-CoV-2 target nucleic acids are DETECTED  SARS-CoV-2 RNA is generally detectable in upper respiratory specimens  during the acute phase of infection.  Positive results are indicative  of the presence of the identified virus, but do not rule out bacterial infection or co-infection with other pathogens not detected by the test.  Clinical correlation with patient history and  other diagnostic information is necessary to determine patient infection status.  The expected result is negative.  Fact Sheet for Patients:   BoilerBrush.com.cy   Fact Sheet for Healthcare Providers:   https://pope.com/    This test is not yet approved or cleared by the [place] FDA and  has been authorized for detection and/or diagnosis of SARS-CoV-2 by FDA under an Emergency Use Authorization (EUA).  This EUA will remain in effect (meaning this tes t can be used) for the duration of  the COVID-19 declaration under Section 564(b)(1) of the Act, 21 U.S.C. section 360-bbb-3(b)(1), unless the authorization is terminated or revoked sooner.  Performed at [facility], [address] [place], [place] [address]   Culture, blood (single)     Status: None   Collection Time: 01/12/21  1:08 PM   Specimen: BLOOD  Result Value Ref Range Status   Specimen Description BLOOD BLOOD RIGHT HAND  Final   Special Requests   Final    BOTTLES DRAWN AEROBIC AND ANAEROBIC Blood Culture adequate volume   Culture   Final    NO GROWTH 5 DAYS Performed at [facility], [address] [place], [place] [address]    Report Status 01/17/2021 FINAL  Final  MRSA PCR Screening     Status: None   Collection Time: 01/12/21  7:22 PM   Specimen:  Nasopharyngeal  Result Value Ref Range Status   MRSA by PCR NEGATIVE NEGATIVE Final    Comment:        The GeneXpert MRSA Assay (FDA approved for NASAL specimens only), is one component of a comprehensive MRSA colonization surveillance program. It is not intended to diagnose MRSA infection nor to guide or monitor treatment for MRSA infections. Performed at [facility], [address] [place], [place] [address]   Culture, respiratory (non-expectorated)     Status: None   Collection Time: 01/13/21  3:04 PM   Specimen: Tracheal Aspirate; Respiratory  Result Value Ref Range Status   Specimen Description   Final    TRACHEAL ASPIRATE Performed at [facility], [address] [place], [place] [address]    Special Requests   Final    NONE Performed  at [facility], [address], [place], [place] [address]    Gram Stain   Final    ABUNDANT WBC PRESENT, PREDOMINANTLY PMN MODERATE GRAM POSITIVE COCCI Performed at [facility], [address] [place], [place] [address]    Culture FEW STAPHYLOCOCCUS AUREUS  Final   Report Status 01/16/2021 FINAL  Final   Organism ID, Bacteria STAPHYLOCOCCUS AUREUS  Final      Susceptibility   Staphylococcus aureus - MIC*    CIPROFLOXACIN <=0.5 SENSITIVE Sensitive     ERYTHROMYCIN <=0.25 SENSITIVE Sensitive     GENTAMICIN <=0.5 SENSITIVE Sensitive     OXACILLIN 0.5 SENSITIVE Sensitive     TETRACYCLINE <=1 SENSITIVE Sensitive     VANCOMYCIN 1 SENSITIVE Sensitive     TRIMETH/SULFA <=10 SENSITIVE Sensitive     CLINDAMYCIN <=0.25 SENSITIVE Sensitive     RIFAMPIN <=0.5 SENSITIVE Sensitive     Inducible Clindamycin NEGATIVE Sensitive     * FEW STAPHYLOCOCCUS AUREUS          IMAGING    DG Abd 1 View  Result Date: 01/19/2021 CLINICAL DATA:  Ileus.  Reported COVID-19 pneumonitis EXAM: ABDOMEN - 1 VIEW COMPARISON:  January 13, 2021. FINDINGS: The nasogastric tube tip in region of pylorus. Right femoral  catheter tip is at the L4 level. Apparent rectal thermometer present. There is a relative paucity of bowel gas. No bowel dilatation or air-fluid levels. No free air evident on supine examination. Airspace opacity left base, incompletely visualized. IMPRESSION: Nasogastric tube tip at gastric pylorus level. Paucity of bowel gas may be indicative of ileus or enteritis. Bowel obstruction felt to be less likely. No free air evident. Electronically Signed   By: Bretta Bang III M.D.   On: 01/19/2021 11:31   CARDIAC CATHETERIZATION  Result Date: 01/20/2021 Conclusions: 1. Minimal coronary artery plaquing without angiographically significant coronary artery disease. 2. Moderately reduced left ventricular contraction with global hypokinesis. 3. Mildly elevated left ventricular filling pressure (LVEDP ~20 mmHg). Recommendations: 1. No obvious coronary artery lesion to explain ST segment changes and sustained ventricular tachycardia.  Possible causes include metabolic/electrolyte derangements and myocarditis in the setting of COVID-19. 2. Trend HS-TnI until it has peaked, then stop. 3. Obtain transthoracic echocardiogram. 4. If blood pressure tolerates, consider addition of low-dose beta-blocker. 5. If patient has recurrent VT, consider IV amiodarone. 6. Replete electrolytes to maintain K > 4.0 and Mg > 2.0. Yvonne Kendall, MD [facility]     Nutrition Status: Nutrition Problem: Inadequate oral intake Etiology: inability to eat Signs/Symptoms: NPO status Interventions: Tube feeding,Prostat     Indwelling Urinary Catheter continued, requirement due to   Reason to continue Indwelling Urinary Catheter strict Intake/Output monitoring for hemodynamic instability   Central Line/ continued, requirement due to  Reason to continue [employer] Monitoring of central venous pressure or other hemodynamic parameters and poor IV access   Ventilator continued, requirement due to severe respiratory failure    Ventilator Sedation RASS 0 to -2      ASSESSMENT AND PLAN SYNOPSIS  Acute hypoxemic respiratory failure due to COVID-19 pneumonia/ARDS Mechanical ventilation via ARDS protocol, target PRVC 6 cc/kg Wean PEEP and FiO2 as able Goal plateau pressure less than 30, driving pressure less than 15 Paralytics if necessary for vent synchrony, gas exchange Cycle prone positioning if necessary for oxygenation Deep sedation per PAD protocol VAP prevention order set Remdesivir Therapy IV STEROIDS Therapy Follow inflammatory markers as needed with CRP Vitamin C, zinc Plan to repeat and check  resp cultures as needed   Severe ACUTE Hypoxic and Hypercapnic Respiratory Failure -continue Full MV support -continue Bronchodilator Therapy -Wean Fio2 and PEEP as tolerated -VAP/VENT bundle implementation  ACUTE SYSTOLIC CARDIAC FAILURE- acute STEMI -oxygen as needed -Lasix as tolerated Follow up cardiology recs   Morbid obesity, possible OSA.   Will certainly impact respiratory mechanics, ventilator weaning Suspect will need to consider additional PEEP   ACUTE KIDNEY INJURY/Renal Failure -continue Foley Catheter-assess need -Avoid nephrotoxic agents -Follow urine output, BMP -Ensure adequate renal perfusion, optimize oxygenation -Renal dose medications HD as needed    NEUROLOGY Acute toxic metabolic encephalopathy, need for sedation Goal RASS -2 to -3   CARDIAC ICU monitoring  ID -continue IV abx as prescibed -follow up cultures  GI GI PROPHYLAXIS as indicated   DIET-->TF's as tolerated Constipation protocol as indicated  ENDO - will use ICU hypoglycemic\Hyperglycemia protocol if indicated    ELECTROLYTES -follow labs as needed -replace as needed -pharmacy consultation and following   DVT/GI PRX ordered and assessed TRANSFUSIONS AS NEEDED MONITOR FSBS I Assessed the need for Labs I Assessed the need for Foley I Assessed the need for Central Venous  Line Family Discussion when available I Assessed the need for Mobilization I made an Assessment of medications to be adjusted accordingly Safety Risk assessment completed   CASE DISCUSSED IN MULTIDISCIPLINARY ROUNDS WITH ICU TEAM  Critical Care Time devoted to patient care services described in this note is 46 minutes.   Overall, patient is critically ill, prognosis is guarded.  Patient with Multiorgan failure and at high risk for cardiac arrest and death.    Lucie Leather, M.D.  [facility]  Medical Director [facility] Medical Director [facility] Cardio-Pulmonary Department

## 2021-01-20 NOTE — Progress Notes (Signed)
*  PRELIMINARY RESULTS* Echocardiogram 2D Echocardiogram has been performed.  Erik [name] 01/20/2021, 9:29 AM

## 2021-01-20 NOTE — Progress Notes (Addendum)
PHARMACY CONSULT NOTE   Pharmacy Consult for Electrolyte Monitoring and Replacement   Recent Labs: Potassium (mmol/L)  Date Value  01/20/2021 2.8 (L)   Magnesium (mg/dL)  Date Value  38/18/2993 2.1   Calcium (mg/dL)  Date Value  71/69/6789 8.2 (L)   Albumin (g/dL)  Date Value  38/09/1750 1.9 (L)   Phosphorus (mg/dL)  Date Value  02/58/5277 5.0 (H)   Sodium (mmol/L)  Date Value  01/20/2021 138    Assessment: 30 year old male admitted with COVID-19 pneumonia and DKA. Started on an insulin drip. Patient with hypoxic respiratory failure requiring intubation 2/1. Patient hypotensive and requiring blood pressure support with Levophed. Patient remains intubated sedated and on mechanical ventilation in the ICU. Pharmacy to manage electrolytes.  Nutrition: Tube feeds + free water flushes 200 mL q2h (2.4 L/day)  MIVF: D5LR at 125 mL/hr switched to D10 @40  mL/hr.  Patient with worsening AKI since admission despite fluid resuscitation. Nephrology following. Starting CRRT 2/3.  Goal of Therapy:  Electrolytes WNL  Plan:  CRRT discontinued 2/9. Insulin drip transitioned to subcu regimen. Receiving potassium replacement at this time. Continue to follow along.  4/9, PharmD 01/20/2021 2:22 PM

## 2021-01-21 ENCOUNTER — Inpatient Hospital Stay: Payer: HRSA Program

## 2021-01-21 DIAGNOSIS — N170 Acute kidney failure with tubular necrosis: Secondary | ICD-10-CM

## 2021-01-21 DIAGNOSIS — K567 Ileus, unspecified: Secondary | ICD-10-CM

## 2021-01-21 LAB — RENAL FUNCTION PANEL
Albumin: 2 g/dL — ABNORMAL LOW (ref 3.5–5.0)
Anion gap: 12 (ref 5–15)
BUN: 62 mg/dL — ABNORMAL HIGH (ref 6–20)
CO2: 20 mmol/L — ABNORMAL LOW (ref 22–32)
Calcium: 8.4 mg/dL — ABNORMAL LOW (ref 8.9–10.3)
Chloride: 107 mmol/L (ref 98–111)
Creatinine, Ser: 4.06 mg/dL — ABNORMAL HIGH (ref 0.61–1.24)
GFR, Estimated: 19 mL/min — ABNORMAL LOW (ref >60)
Glucose, Bld: 141 mg/dL — ABNORMAL HIGH (ref 70–99)
Phosphorus: 6 mg/dL — ABNORMAL HIGH (ref 2.5–4.6)
Potassium: 3.3 mmol/L — ABNORMAL LOW (ref 3.5–5.1)
Sodium: 139 mmol/L (ref 135–145)

## 2021-01-21 LAB — CBC WITH DIFFERENTIAL/PLATELET
Abs Immature Granulocytes: 2.32 10*3/uL — ABNORMAL HIGH (ref 0.00–0.07)
Basophils Absolute: 0.1 10*3/uL (ref 0.0–0.1)
Basophils Relative: 0 %
Eosinophils Absolute: 0 10*3/uL (ref 0.0–0.5)
Eosinophils Relative: 0 %
HCT: 28 % — ABNORMAL LOW (ref 39.0–52.0)
Hemoglobin: 8.9 g/dL — ABNORMAL LOW (ref 13.0–17.0)
Immature Granulocytes: 14 %
Lymphocytes Relative: 16 %
Lymphs Abs: 2.8 10*3/uL (ref 0.7–4.0)
MCH: 26.1 pg (ref 26.0–34.0)
MCHC: 31.8 g/dL (ref 30.0–36.0)
MCV: 82.1 fL (ref 80.0–100.0)
Monocytes Absolute: 2.3 10*3/uL — ABNORMAL HIGH (ref 0.1–1.0)
Monocytes Relative: 14 %
Neutro Abs: 9.6 10*3/uL — ABNORMAL HIGH (ref 1.7–7.7)
Neutrophils Relative %: 56 %
Platelets: 197 10*3/uL (ref 150–400)
RBC: 3.41 MIL/uL — ABNORMAL LOW (ref 4.22–5.81)
RDW: 14.7 % (ref 11.5–15.5)
Smear Review: NORMAL
WBC: 17.1 10*3/uL — ABNORMAL HIGH (ref 4.0–10.5)
nRBC: 0.4 % — ABNORMAL HIGH (ref 0.0–0.2)

## 2021-01-21 LAB — FERRITIN: Ferritin: 733 ng/mL — ABNORMAL HIGH (ref 24–336)

## 2021-01-21 LAB — TROPONIN I (HIGH SENSITIVITY)
Troponin I (High Sensitivity): 4519 ng/L (ref <18)
Troponin I (High Sensitivity): 6816 ng/L (ref <18)

## 2021-01-21 LAB — GLUCOSE, CAPILLARY
Glucose-Capillary: 134 mg/dL — ABNORMAL HIGH (ref 70–99)
Glucose-Capillary: 120 mg/dL — ABNORMAL HIGH (ref 70–99)
Glucose-Capillary: 137 mg/dL — ABNORMAL HIGH (ref 70–99)
Glucose-Capillary: 176 mg/dL — ABNORMAL HIGH (ref 70–99)
Glucose-Capillary: 170 mg/dL — ABNORMAL HIGH (ref 70–99)

## 2021-01-21 LAB — FIBRIN DERIVATIVES D-DIMER (ARMC ONLY): Fibrin derivatives D-dimer (ARMC): 2036.3 ng/mL (FEU) — ABNORMAL HIGH (ref 0.00–499.00)

## 2021-01-21 LAB — HEPARIN LEVEL (UNFRACTIONATED)
Heparin Unfractionated: 0.21 IU/mL — ABNORMAL LOW (ref 0.30–0.70)
Heparin Unfractionated: 0.43 IU/mL (ref 0.30–0.70)
Heparin Unfractionated: 1.13 IU/mL — ABNORMAL HIGH (ref 0.30–0.70)

## 2021-01-21 LAB — C-REACTIVE PROTEIN: CRP: 13.7 mg/dL — ABNORMAL HIGH (ref <1.0)

## 2021-01-21 LAB — MAGNESIUM: Magnesium: 2.2 mg/dL (ref 1.7–2.4)

## 2021-01-21 MED ORDER — POTASSIUM CHLORIDE 10 MEQ/50ML IV SOLN
10.0000 meq | INTRAVENOUS | Status: AC
  Administered 2021-01-21 (×4): 10 meq via INTRAVENOUS
  Filled 2021-01-21 (×4): qty 50

## 2021-01-21 MED ORDER — HEPARIN (PORCINE) 25000 UT/250ML-% IV SOLN
1550.0000 [IU]/h | INTRAVENOUS | Status: DC
  Administered 2021-01-21 (×2): 1800 [IU]/h via INTRAVENOUS
  Filled 2021-01-21 (×2): qty 250

## 2021-01-21 MED ORDER — SODIUM CHLORIDE 0.9 % IV SOLN
250.0000 mg | Freq: Four times a day (QID) | INTRAVENOUS | Status: DC
  Administered 2021-01-21 – 2021-01-25 (×16): 250 mg via INTRAVENOUS
  Filled 2021-01-21 (×20): qty 5

## 2021-01-21 MED ORDER — HEPARIN BOLUS VIA INFUSION
1700.0000 [IU] | Freq: Once | INTRAVENOUS | Status: AC
  Administered 2021-01-21: 1700 [IU] via INTRAVENOUS
  Filled 2021-01-21: qty 1700

## 2021-01-21 MED ORDER — POTASSIUM CHLORIDE 20 MEQ PO PACK: 40.0000 meq | PACK | Freq: Once | ORAL | Status: DC

## 2021-01-21 NOTE — Progress Notes (Signed)
ANTICOAGULATION CONSULT NOTE  Pharmacy Consult for Heparin Infusion Indication: ACS/STEMI  No Known Allergies  Patient Measurements: Height: 6' (182.9 cm) Weight: (!) 192.8 kg (425 lb) IBW/kg (Calculated) : 77.6 Heparin Dosing Weight: 115.8 kg  Vital Signs: Temp: 99.5 F (37.5 C) (02/10 1700) Temp Source: Esophageal (02/10 1600) BP: 119/70 (02/10 1700) Pulse Rate: 73 (02/10 1700)  Labs: Recent Labs    01/20/21 0423 01/20/21 0424 01/20/21 0518 01/20/21 1003 01/20/21 1620 01/20/21 2223 01/20/21 2350 01/21/21 0502 01/21/21 0504 01/21/21 0901 01/21/21 1514  HGB 8.6*  --   --  9.0*  --   --   --  8.9*  --   --   --   HCT 27.4*  --   --  28.6*  --   --   --  28.0*  --   --   --   PLT 136*  --   --  138*  --   --   --  197  --   --   --   HEPARINUNFRC  --   --   --   --   --  0.21*  --   --  0.43  --  1.13*  CREATININE  --  3.37*  --  3.59*  --   --   --  4.06*  --   --   --   TROPONINIHS  --   --    < > 4,189* 6,440*  --  6,816*  --   --  4,519*  --    < > = values in this interval not displayed.    Estimated Creatinine Clearance: 47 mL/min (A) (by C-G formula based on SCr of 4.06 mg/dL (H)).   Medical History: Past Medical History:  Diagnosis Date  . Asthma     Assessment: Pt is 30 yo male admitted on 2/1 for Acute hypoxemic respiratory failure due to COVID. Patient taken to cath lab for STEMI. Consult for heparin drip but it does not appear heparin drip was ever started. Consult to continue heparin for 48 hours.  02/09 2223 HL 0.21, subtherapeutic 02/10 0504 HL 0.43, therapeutic 02/10 1514 HL 1.13: supratherapeutic  Goal of Therapy:  Heparin level 0.3-0.7 units/ml Monitor platelets by anticoagulation protocol: Yes   Plan:   Hold heparin drip for one hour then decrease rate to 1550 units/hr  Re-check anti-Xa level 6 hours after rate change   CBC daily while on heparin drip  Laureen Ochs, PharmD 01/21/2021 5:09 PM

## 2021-01-21 NOTE — Progress Notes (Signed)
CRITICAL CARE NOTE 30 yo morbidly obese male with ASTHMA now with progressive multiorgan failure with COVID 19 pneumonia and ARDS with severe acidosis and severe hypoxia with ARDS with acute renal failure  2/1 Emergently intubated 2/2 severe resp failure, vasc cath placed 2/3 severe resp failure 01/15/2021-patient is critically Ill, CRRT continued. Borderline low blood glucose today.  01/16/2021-patient with thickened heavy ETT secretions. RN reporting inadequate sedation with patient making motions to remove airway, we have advanced RASS to range -3 to -4. Remains critically ill with ongoing renal replacement therapy and COVID treatment.  01/17/2021-patient had thickened secretions again today, he was lavaged with saline by RT today with good response. We will try awakening trial again in AM.  01/18/2021-remains on vent, on CRRT 01/19/2021-acute MI 2/9 s/p cath, multiorgan failure +vomiting 2/10 FAILED WEANING TRIALS   CC  follow up respiratory failure  SUBJECTIVE Patient remains critically ill Prognosis is guarded   BP (!) 159/104   Pulse (!) 106   Temp 99.32 F (37.4 C)   Resp (!) 25   Ht 6' (1.829 m)   Wt (!) 192.8 kg   SpO2 100%   BMI 57.64 kg/m    I/O last 3 completed shifts: In: 3027.8 [I.V.:2502.3; NG/GT:375.5; IV Piggyback:150.1] Out: 2637 [Urine:1049; Emesis/NG output:350; BJYNW:2956Other:1113; Stool:125] No intake/output data recorded.  SpO2: 100 % O2 Flow Rate (L/min): 7 L/min FiO2 (%): 24 %  Estimated body mass index is 57.64 kg/m as calculated from the following:   Height as of this encounter: 6' (1.829 m).   Weight as of this encounter: 192.8 kg.  SIGNIFICANT EVENTS   REVIEW OF SYSTEMS  PATIENT IS UNABLE TO PROVIDE COMPLETE REVIEW OF SYSTEMS DUE TO SEVERE CRITICAL ILLNESS       PHYSICAL EXAMINATION: GENERAL:critically ill appearing, +resp distress NECK: Supple.  PULMONARY: +rhonchi, +wheezing CARDIOVASCULAR: S1 and S2.  GASTROINTESTINAL:  Soft, nontender, +Positive bowel sounds.  MUSCULOSKELETAL: No swelling, clubbing, or edema.  NEUROLOGIC: obtunded, GCS<8 SKIN:intact,warm,dry     MEDICATIONS: I have reviewed all medications and confirmed regimen as documented   CULTURE RESULTS   Recent Results (from the past 240 hour(s))  Blood culture (routine single)     Status: None   Collection Time: 01/12/21  9:05 AM   Specimen: BLOOD  Result Value Ref Range Status   Specimen Description BLOOD LEFT AC  Final   Special Requests   Final    BOTTLES DRAWN AEROBIC ONLY Blood Culture results may not be optimal due to an inadequate volume of blood received in culture bottles   Culture   Final    NO GROWTH 5 DAYS Performed at [facility], [address] [place], [place] [id number]    Report Status 01/17/2021 FINAL  Final  SARS Coronavirus 2 by RT PCR (hospital order, performed in [facility] hospital lab) Nasopharyngeal Nasopharyngeal Swab     Status: Abnormal   Collection Time: 01/12/21  9:36 AM   Specimen: Nasopharyngeal Swab  Result Value Ref Range Status   SARS Coronavirus 2 POSITIVE (A) NEGATIVE Final    Comment: RESULT CALLED TO, READ BACK BY AND VERIFIED WITH: ANNA JASPER ON 01/12/21 AT 1111 SDR (NOTE) SARS-CoV-2 target nucleic acids are DETECTED  SARS-CoV-2 RNA is generally detectable in upper respiratory specimens  during the acute phase of infection.  Positive results are indicative  of the presence of the identified virus, but do not rule out bacterial infection or co-infection with other pathogens not detected by the test.  Clinical correlation with  patient history and  other diagnostic information is necessary to determine patient infection status.  The expected result is negative.  Fact Sheet for Patients:   BoilerBrush.com.cy   Fact Sheet for Healthcare Providers:   https://pope.com/    This test is not yet approved or cleared by the [place]  FDA and  has been authorized for detection and/or diagnosis of SARS-CoV-2 by FDA under an Emergency Use Authorization (EUA).  This EUA will remain in effect (meaning this tes t can be used) for the duration of  the COVID-19 declaration under Section 564(b)(1) of the Act, 21 U.S.C. section 360-bbb-3(b)(1), unless the authorization is terminated or revoked sooner.  Performed at [facility], [address] [place], [place] [address]   Culture, blood (single)     Status: None   Collection Time: 01/12/21  1:08 PM   Specimen: BLOOD  Result Value Ref Range Status   Specimen Description BLOOD BLOOD RIGHT HAND  Final   Special Requests   Final    BOTTLES DRAWN AEROBIC AND ANAEROBIC Blood Culture adequate volume   Culture   Final    NO GROWTH 5 DAYS Performed at [facility], [address] [place], [place] [address]    Report Status 01/17/2021 FINAL  Final  MRSA PCR Screening     Status: None   Collection Time: 01/12/21  7:22 PM   Specimen: Nasopharyngeal  Result Value Ref Range Status   MRSA by PCR NEGATIVE NEGATIVE Final    Comment:        The GeneXpert MRSA Assay (FDA approved for NASAL specimens only), is one component of a comprehensive MRSA colonization surveillance program. It is not intended to diagnose MRSA infection nor to guide or monitor treatment for MRSA infections. Performed at [facility], [address] [place], [place] [address]   Culture, respiratory (non-expectorated)     Status: None   Collection Time: 01/13/21  3:04 PM   Specimen: Tracheal Aspirate; Respiratory  Result Value Ref Range Status   Specimen Description   Final    TRACHEAL ASPIRATE Performed at [facility], [address], [place], [place] [address]    Special Requests   Final    NONE Performed at [facility], [address] [place], [place] [address]    Gram Stain   Final    ABUNDANT WBC PRESENT, PREDOMINANTLY PMN MODERATE GRAM  POSITIVE COCCI Performed at [facility], [address], [place], [place] [address]    Culture FEW STAPHYLOCOCCUS AUREUS  Final   Report Status 01/16/2021 FINAL  Final   Organism ID, Bacteria STAPHYLOCOCCUS AUREUS  Final      Susceptibility   Staphylococcus aureus - MIC*    CIPROFLOXACIN <=0.5 SENSITIVE Sensitive     ERYTHROMYCIN <=0.25 SENSITIVE Sensitive     GENTAMICIN <=0.5 SENSITIVE Sensitive     OXACILLIN 0.5 SENSITIVE Sensitive     TETRACYCLINE <=1 SENSITIVE Sensitive     VANCOMYCIN 1 SENSITIVE Sensitive     TRIMETH/SULFA <=10 SENSITIVE Sensitive     CLINDAMYCIN <=0.25 SENSITIVE Sensitive     RIFAMPIN <=0.5 SENSITIVE Sensitive     Inducible Clindamycin NEGATIVE Sensitive     * FEW STAPHYLOCOCCUS AUREUS  Culture, respiratory (non-expectorated)     Status: None (Preliminary result)   Collection Time: 01/19/21 10:41 PM   Specimen: Tracheal Aspirate; Respiratory  Result Value Ref Range Status   Specimen Description   Final    TRACHEAL ASPIRATE Performed at [facility], [address], [place], [place] [address]    Special Requests   Final    NONE Performed at [facility], [address] [place], [place] [address]    Gram Stain   Final    FEW WBC PRESENT,BOTH PMN AND MONONUCLEAR RARE BUDDING YEAST SEEN Performed at [facility], [address], [place], [place] [address]    Culture PENDING  Incomplete   Report Status PENDING  Incomplete          IMAGING    DG Abd 1 View  Result Date: 01/21/2021 CLINICAL DATA:  30 year old male positive COVID-19. Decreased bowel sounds. EXAM: ABDOMEN - 1 VIEW COMPARISON:  01/19/2021 and earlier. FINDINGS: Portable AP supine view at 0626 hours. Stable enteric tube, tip at the distal stomach. Right femoral approach catheter appears stable. Continued largely absent bowel gas throughout the abdomen and pelvis. Trace gas in the region of the rectum. No dilated bowel loops are evident. Stable visualized  osseous structures. IMPRESSION: 1. Stable enteric tube and right femoral catheter. 2. Continued virtual absence of bowel gas throughout the abdomen and pelvis. No dilated loops are evident, although if there is new abdominal distension then recommend follow-up CT Abdomen and Pelvis as fluid-filled abnormal bowel is difficult to exclude. Electronically Signed   By: Odessa Fleming M.D.   On: 01/21/2021 06:49   ECHOCARDIOGRAM COMPLETE  Result Date: 01/20/2021    ECHOCARDIOGRAM REPORT   Patient Name:   Erik [name] Date of Exam: 01/20/2021 Medical Rec #:  [id number]    Height:       72.0 in Accession #:    [id number]   Weight:       425.0 lb Date of Birth:  10/09/1991    BSA:          2.935 m Patient Age:    29 years     BP:           99/52 mmHg Patient Gender: M            HR:           80 bpm. Exam Location:  [facility] Procedure: 2D Echo, Color Doppler and Cardiac Doppler Indications:    I47.2 Ventricular Tachycardia  History:        Patient has no prior history of Echocardiogram examinations.                 Asthma                 Pt tested positive for COVID-19 on 01/12/21.  Sonographer:    Humphrey Rolls RDCS (AE) Referring Phys: [id number] CHRISTOPHER END Diagnosing      Debbe Odea MD Phys:  Sonographer Comments: Echo performed with patient supine and on artificial respirator and suboptimal apical window. Image acquisition challenging due to patient body habitus. IMPRESSIONS  1. Left ventricular ejection fraction, by estimation, is 60 to 65%. The left ventricle has normal function. The left ventricle has no regional wall motion abnormalities. Left ventricular diastolic parameters were normal.  2. Right ventricular systolic function is normal. The right ventricular size is normal.  3. The mitral valve is normal in structure. No evidence of mitral valve regurgitation. No evidence of mitral stenosis.  4. The aortic valve is normal in structure. Aortic valve regurgitation is not visualized. No aortic stenosis is present.  5. The  inferior vena cava is normal in size with greater than 50% respiratory variability, suggesting right atrial pressure of 3  mmHg. FINDINGS  Left Ventricle: Left ventricular ejection fraction, by estimation, is 60 to 65%. The left ventricle has normal function. The left ventricle has no regional wall motion abnormalities. The left ventricular internal cavity size was normal in size. There is  no left ventricular hypertrophy. Left ventricular diastolic parameters were normal. Right Ventricle: The right ventricular size is normal. No increase in right ventricular wall thickness. Right ventricular systolic function is normal. Left Atrium: Left atrial size was normal in size. Right Atrium: Right atrial size was normal in size. Pericardium: There is no evidence of pericardial effusion. Mitral Valve: The mitral valve is normal in structure. No evidence of mitral valve regurgitation. No evidence of mitral valve stenosis. MV peak gradient, 4.2 mmHg. The mean mitral valve gradient is 2.0 mmHg. Tricuspid Valve: The tricuspid valve is normal in structure. Tricuspid valve regurgitation is not demonstrated. No evidence of tricuspid stenosis. Aortic Valve: The aortic valve is normal in structure. Aortic valve regurgitation is not visualized. No aortic stenosis is present. Aortic valve mean gradient measures 4.0 mmHg. Aortic valve peak gradient measures 7.7 mmHg. Aortic valve area, by VTI measures 3.59 cm. Pulmonic Valve: The pulmonic valve was normal in structure. Pulmonic valve regurgitation is not visualized. No evidence of pulmonic stenosis. Aorta: The aortic root is normal in size and structure. Venous: The inferior vena cava is normal in size with greater than 50% respiratory variability, suggesting right atrial pressure of 3 mmHg. IAS/Shunts: No atrial level shunt detected by color flow Doppler.  LEFT VENTRICLE PLAX 2D LVIDd:         5.10 cm  Diastology LVIDs:         2.90 cm  LV e' medial:    6.53 cm/s LV PW:         1.00 cm   LV E/e' medial:  14.0 LV IVS:        0.90 cm  LV e' lateral:   6.74 cm/s LVOT diam:     2.30 cm  LV E/e' lateral: 13.6 LV SV:         76 LV SV Index:   26 LVOT Area:     4.15 cm  LEFT ATRIUM           Index LA diam:      4.10 cm 1.40 cm/m LA Vol (A4C): 62.0 ml 21.12 ml/m  AORTIC VALVE                   PULMONIC VALVE AV Area (Vmax):    3.23 cm    PV Vmax:       1.54 m/s AV Area (Vmean):   3.44 cm    PV Vmean:      88.800 cm/s AV Area (VTI):     3.59 cm    PV VTI:        0.259 m AV Vmax:           139.00 cm/s PV Peak grad:  9.5 mmHg AV Vmean:          93.100 cm/s PV Mean grad:  4.0 mmHg AV VTI:            0.213 m AV Peak Grad:      7.7 mmHg AV Mean Grad:      4.0 mmHg LVOT Vmax:         108.00 cm/s LVOT Vmean:        77.100 cm/s LVOT VTI:          0.184 m  LVOT/AV VTI ratio: 0.86  AORTA Ao Root diam: 3.10 cm MITRAL VALVE MV Area (PHT): 3.56 cm    SHUNTS MV Area VTI:   2.67 cm    Systemic VTI:  0.18 m MV Peak grad:  4.2 mmHg    Systemic Diam: 2.30 cm MV Mean grad:  2.0 mmHg MV Vmax:       1.02 m/s MV Vmean:      58.2 cm/s MV Decel Time: 213 msec MV E velocity: 91.70 cm/s MV A velocity: 39.80 cm/s MV E/A ratio:  2.30 Debbe Odea MD Electronically signed by Debbe Odea MD Signature Date/Time: 01/20/2021/5:48:37 PM    Final      Nutrition Status: Nutrition Problem: Inadequate oral intake Etiology: inability to eat Signs/Symptoms: NPO status Interventions: Tube feeding,Prostat     Indwelling Urinary Catheter continued, requirement due to   Reason to continue Indwelling Urinary Catheter strict Intake/Output monitoring for hemodynamic instability   Central Line/ continued, requirement due to  Reason to continue [employer] Monitoring of central venous pressure or other hemodynamic parameters and poor IV access   Ventilator continued, requirement due to severe respiratory failure   Ventilator Sedation RASS 0 to -2      ASSESSMENT AND PLAN SYNOPSIS  29 YO MORBIDLY OBESE AAM WITH  SEVERE ARDS DUE TO COVID 19  PNEUMONIA WITH ACUTE RENAL FAILURE AND MULTIORGAN AIL FAILURE COMPLICATED BY FAILURE TO WEAN FROM VENT WITH ACUTE ABD ILEUS  Acute hypoxemic respiratory failure due to COVID-19 pneumonia/ARDS Mechanical ventilation via ARDS protocol, target PRVC 6 cc/kg Wean PEEP and FiO2 as able Goal plateau pressure less than 30, driving pressure less than 15 Paralytics if necessary for vent synchrony, gas exchange Deep sedation per PAD protocol Diuresis as tolerated based on Kidney function VAP prevention order set Remdesivir Therapy IV STEROIDS Therapy Follow inflammatory markers as needed with CRP Vitamin C, zinc Plan to repeat and check resp cultures as needed   Severe ACUTE Hypoxic and Hypercapnic Respiratory Failure -continue Full MV support -continue Bronchodilator Therapy -Wean Fio2 and PEEP as tolerated -VAP/VENT bundle implementation  ACUTE DIASTOLIC CARDIAC FAILURE-  -oxygen as needed -Lasix as tolerated   Morbid obesity, possible OSA.   Will certainly impact respiratory mechanics, ventilator weaning Suspect will need to consider additional PEEP   ACUTE KIDNEY INJURY/Renal Failure -continue Foley Catheter-assess need -Avoid nephrotoxic agents -Follow urine output, BMP -Ensure adequate renal perfusion, optimize oxygenation -Renal dose medications HD AS NEEDED    NEUROLOGY Acute toxic metabolic encephalopathy, need for sedation Goal RASS -2 to -3    CARDIAC ICU monitoring  ID -continue IV abx as prescibed -follow up cultures  GI GI PROPHYLAXIS as indicated   DIET-->hold TF +signs of ileus, start erythromycin for gut motility Constipation protocol as indicated  ENDO - will use ICU hypoglycemic\Hyperglycemia protocol if indicated    ELECTROLYTES -follow labs as needed -replace as needed -pharmacy consultation and following   DVT/GI PRX ordered and assessed TRANSFUSIONS AS NEEDED MONITOR FSBS I Assessed the need for  Labs I Assessed the need for Foley I Assessed the need for Central Venous Line Family Discussion when available I Assessed the need for Mobilization I made an Assessment of medications to be adjusted accordingly Safety Risk assessment completed   CASE DISCUSSED IN MULTIDISCIPLINARY ROUNDS WITH ICU TEAM  Critical Care Time devoted to patient care services described in this note is 55 minutes.   Overall, patient is critically ill, prognosis is guarded.  Patient with Multiorgan failure and at high risk  for cardiac arrest and death.    Corrin Parker, M.D.  [facility]  Medical Director [place] Director [facility] Cardio-Pulmonary Department

## 2021-01-21 NOTE — Progress Notes (Signed)
PHARMACY CONSULT NOTE   Pharmacy Consult for Electrolyte Monitoring and Replacement   Recent Labs: Potassium (mmol/L)  Date Value  01/21/2021 3.3 (L)   Magnesium (mg/dL)  Date Value  86/57/8469 2.2   Calcium (mg/dL)  Date Value  62/95/2841 8.4 (L)   Albumin (g/dL)  Date Value  32/44/0102 2.0 (L)   Phosphorus (mg/dL)  Date Value  72/53/6644 6.0 (H)   Sodium (mmol/L)  Date Value  01/21/2021 139    Assessment: 30 year old male admitted with COVID-19 pneumonia and DKA. Started on an insulin drip. Patient with hypoxic respiratory failure requiring intubation 2/1. Patient hypotensive and requiring blood pressure support with Levophed. Patient remains intubated sedated and on mechanical ventilation in the ICU. Pharmacy to manage electrolytes.  Nutrition: Tube feeds + free water flushes 200 mL q2h (2.4 L/day)  MIVF: D5LR at 125 mL/hr switched to D10 @40  mL/hr.  Nephrology following. Previously on CRRT which was stopped 2/9. Following daily for HD needs.  Goal of Therapy:  Electrolytes WNL  Plan:  Discussed with Nephrologist. Will give potassium 40 mEq total today for K 3.3. Plan for HD. Continue to follow closely.  4/9, PharmD 01/21/2021 2:29 PM

## 2021-01-21 NOTE — Progress Notes (Signed)
ANTICOAGULATION CONSULT NOTE  Pharmacy Consult for Heparin Infusion Indication: ACS/STEMI  No Known Allergies  Patient Measurements: Height: 6' (182.9 cm) Weight: (!) 192.8 kg (425 lb) IBW/kg (Calculated) : 77.6 Heparin Dosing Weight: 115.8 kg  Vital Signs: Temp: 100.04 F (37.8 C) (02/09 2300) Temp Source: Bladder (02/09 2000) BP: 112/63 (02/09 2300) Pulse Rate: 76 (02/09 2300)  Labs: Recent Labs    01/19/21 0409 01/19/21 1551 01/20/21 0423 01/20/21 0424 01/20/21 0518 01/20/21 1003 01/20/21 1620 01/20/21 2223  HGB 9.3*  --  8.6*  --   --  9.0*  --   --   HCT 28.8*  --  27.4*  --   --  28.6*  --   --   PLT 121*  --  136*  --   --  138*  --   --   HEPARINUNFRC  --   --   --   --   --   --   --  0.21*  CREATININE  --  3.42*  --  3.37*  --  3.59*  --   --   TROPONINIHS  --   --   --   --  47* 4,189* 6,440*  --     Estimated Creatinine Clearance: 53.1 mL/min (A) (by C-G formula based on SCr of 3.59 mg/dL (H)).   Medical History: Past Medical History:  Diagnosis Date  . Asthma     Assessment: Pt is 30 yo male admitted on 2/1 for Acute hypoxemic respiratory failure due to COVID. Patient taken to cath lab for STEMI. Consult for heparin drip but it does not appear heparin drip was ever started. Consult to continue heparin for 48 hours.  02/09 2223 HL = 0.21, subtherapeutic  Goal of Therapy:  Heparin level 0.3-0.7 units/ml Monitor platelets by anticoagulation protocol: Yes   Plan:  Heparin 1700 unit bolus followed by increasing heparin drip at 1800 units/hr. Recheck HL in 6 hours. CBC daily while on heparin drip.  Otelia Sergeant, PharmD, [facility] 01/21/2021 12:29 AM

## 2021-01-21 NOTE — Progress Notes (Signed)
Neuro: stable on sedation, follows commands, nods appropriately Resp: stable on ventilator settings, copious thick tan secretions throughout the night CV: TMAX 38, environmental changes made with improvement, vital signs otherwise stable, generalized edema GIGU: OG to low intermittent suction-output appears brown/tan grainy with mucous, foley in place, flexiseal in place with minimal output, bowel sounds hypoactive though improving Skin: moisture associated skin damage to his groin and buttock, foam applied Social: update given to mother over the phone, all questions and concerns addressed  Events: No significant events or deviations

## 2021-01-21 NOTE — Progress Notes (Signed)
[place], [place] 01/21/21  Subjective:   Hospital day # 9 Patient remains critically ill at the moment. Still on mechanical ventilation. Urine output does appear to be increasing. Patient undergo dialysis treatment today.  Renal:  02/09 0701 - 02/10 0700 In: 2232.8 [I.V.:1877.2; NG/GT:205.5; IV Piggyback:150.1] Out: 5400 [Urine:870; Emesis/NG output:350; Stool:25] Lab Results  Component Value Date   CREATININE 4.06 (H) 01/21/2021   CREATININE 3.59 (H) 01/20/2021   CREATININE 3.37 (H) 01/20/2021      Objective:  Vital signs in last 24 hours:  Temp:  [98.24 F (36.8 C)-100.4 F (38 C)] 99.14 F (37.3 C) (02/10 1330) Pulse Rate:  [56-124] 62 (02/10 1330) Resp:  [22-27] 25 (02/10 1330) BP: (82-159)/(50-104) 91/55 (02/10 1330) SpO2:  [95 %-100 %] 100 % (02/10 1300) FiO2 (%):  [24 %] 24 % (02/10 1200)  Weight change:  Filed Weights   01/17/21 0500 01/18/21 0345 01/19/21 0500  Weight: (!) 195.5 kg (!) 195 kg (!) 192.8 kg    Intake/Output:    Intake/Output Summary (Last 24 hours) at 01/21/2021 1358 Last data filed at 01/21/2021 1200 Gross per 24 hour  Intake 2239.26 ml  Output 1360 ml  Net 879.26 ml    Physical Exam: General: Critically ill-appearing  HEENT ETT in place  Lungs: Vent assisted  Heart:: Regular  Abdomen: Mild distension  Extremities: no edema  Neurologic: sedated  Skin: No acute rash  Access: left IJ temp cath  Foley: Present      Basic Metabolic Panel:  Recent Labs  Lab 01/18/21 0346 01/18/21 1618 01/19/21 0409 01/19/21 1551 01/20/21 0423 01/20/21 0424 01/20/21 1003 01/20/21 2155 01/21/21 0502  NA 135 135  --  137  --  138  --   --  139  K 3.6 3.7  --  3.4*  --  2.8*  --  4.1 3.3*  CL 102 102  --  102  --  103  --   --  107  CO2 23 24  --  22  --  23  --   --  20*  GLUCOSE 269* 289*  --  200*  --  135*  --   --  141*  BUN 66* 68*  --  57*  --  51*  --   --  62*  CREATININE 4.24* 3.94*  --  3.42*   --  3.37* 3.59*  --  4.06*  CALCIUM 8.3* 8.5*  --  8.7*  --  8.2*  --   --  8.4*  MG 2.0  --  1.9  --  2.1  --   --  2.2 2.2  PHOS 5.7* 5.9*  --  5.2*  --  5.0*  --   --  6.0*     CBC: Recent Labs  Lab 01/16/21 0419 01/17/21 0433 01/18/21 0346 01/19/21 0409 01/20/21 0423 01/20/21 1003 01/21/21 0502  WBC 14.3* 10.4 16.1* 18.3* 15.0* 18.0* 17.1*  NEUTROABS 10.7* 7.2  --  10.6* 8.7*  --  9.6*  HGB 10.6* 9.9* 10.0* 9.3* 8.6* 9.0* 8.9*  HCT 35.0* 30.6* 32.2* 28.8* 27.4* 28.6* 28.0*  MCV 85.6 81.4 82.6 81.6 82.5 82.7 82.1  PLT 143* 111* 123* 121* 136* 138* 197      Lab Results  Component Value Date   HEPBSAG NON REACTIVE 01/20/2021   HEPBSAB NON REACTIVE 01/20/2021      Microbiology:  Recent Results (from the past 240 hour(s))  Blood culture (routine single)     Status:  None   Collection Time: 01/12/21  9:05 AM   Specimen: BLOOD  Result Value Ref Range Status   Specimen Description BLOOD LEFT AC  Final   Special Requests   Final    BOTTLES DRAWN AEROBIC ONLY Blood Culture results may not be optimal due to an inadequate volume of blood received in culture bottles   Culture   Final    NO GROWTH 5 DAYS Performed at [facility], [address] [place], [place] [address]    Report Status 01/17/2021 FINAL  Final  SARS Coronavirus 2 by RT PCR (hospital order, performed in [place] hospital lab) Nasopharyngeal Nasopharyngeal Swab     Status: Abnormal   Collection Time: 01/12/21  9:36 AM   Specimen: Nasopharyngeal Swab  Result Value Ref Range Status   SARS Coronavirus 2 POSITIVE (A) NEGATIVE Final    Comment: RESULT CALLED TO, READ BACK BY AND VERIFIED WITH: ANNA JASPER ON 01/12/21 AT 1111 SDR (NOTE) SARS-CoV-2 target nucleic acids are DETECTED  SARS-CoV-2 RNA is generally detectable in upper respiratory specimens  during the acute phase of infection.  Positive results are indicative  of the presence of the identified virus, but do not rule out bacterial  infection or co-infection with other pathogens not detected by the test.  Clinical correlation with patient history and  other diagnostic information is necessary to determine patient infection status.  The expected result is negative.  Fact Sheet for Patients:   StrictlyIdeas.no   Fact Sheet for Healthcare Providers:   BankingDealers.co.za    This test is not yet approved or cleared by the [place] FDA and  has been authorized for detection and/or diagnosis of SARS-CoV-2 by FDA under an Emergency Use Authorization (EUA).  This EUA will remain in effect (meaning this tes t can be used) for the duration of  the COVID-19 declaration under Section 564(b)(1) of the Act, 21 U.S.C. section 360-bbb-3(b)(1), unless the authorization is terminated or revoked sooner.  Performed at [facility], [address] [place], [place] [address]   Culture, blood (single)     Status: None   Collection Time: 01/12/21  1:08 PM   Specimen: BLOOD  Result Value Ref Range Status   Specimen Description BLOOD BLOOD RIGHT HAND  Final   Special Requests   Final    BOTTLES DRAWN AEROBIC AND ANAEROBIC Blood Culture adequate volume   Culture   Final    NO GROWTH 5 DAYS Performed at [facility], [address] [place], [place] [address]    Report Status 01/17/2021 FINAL  Final  MRSA PCR Screening     Status: None   Collection Time: 01/12/21  7:22 PM   Specimen: Nasopharyngeal  Result Value Ref Range Status   MRSA by PCR NEGATIVE NEGATIVE Final    Comment:        The GeneXpert MRSA Assay (FDA approved for NASAL specimens only), is one component of a comprehensive MRSA colonization surveillance program. It is not intended to diagnose MRSA infection nor to guide or monitor treatment for MRSA infections. Performed at [facility], [address] [place], [place] [address]   Culture, respiratory (non-expectorated)      Status: None   Collection Time: 01/13/21  3:04 PM   Specimen: Tracheal Aspirate; Respiratory  Result Value Ref Range Status   Specimen Description   Final    TRACHEAL ASPIRATE Performed at [facility], [address] [place], [place] [address]    Special Requests  Final    NONE Performed at [facility], [place], [place] [address]    Gram Stain   Final    ABUNDANT WBC PRESENT, PREDOMINANTLY PMN MODERATE GRAM POSITIVE COCCI Performed at [facility], [place] [address], [place], [place] [address]    Culture FEW STAPHYLOCOCCUS AUREUS  Final   Report Status 01/16/2021 FINAL  Final   Organism ID, Bacteria STAPHYLOCOCCUS AUREUS  Final      Susceptibility   Staphylococcus aureus - MIC*    CIPROFLOXACIN <=0.5 SENSITIVE Sensitive     ERYTHROMYCIN <=0.25 SENSITIVE Sensitive     GENTAMICIN <=0.5 SENSITIVE Sensitive     OXACILLIN 0.5 SENSITIVE Sensitive     TETRACYCLINE <=1 SENSITIVE Sensitive     VANCOMYCIN 1 SENSITIVE Sensitive     TRIMETH/SULFA <=10 SENSITIVE Sensitive     CLINDAMYCIN <=0.25 SENSITIVE Sensitive     RIFAMPIN <=0.5 SENSITIVE Sensitive     Inducible Clindamycin NEGATIVE Sensitive     * FEW STAPHYLOCOCCUS AUREUS  Culture, respiratory (non-expectorated)     Status: None (Preliminary result)   Collection Time: 01/19/21 10:41 PM   Specimen: Tracheal Aspirate; Respiratory  Result Value Ref Range Status   Specimen Description   Final    TRACHEAL ASPIRATE Performed at [facility], [address] [place], [place] [address]    Special Requests   Final    NONE Performed at [facility], [address] [place], [place] [address]    Gram Stain   Final    FEW WBC PRESENT,BOTH PMN AND MONONUCLEAR RARE BUDDING YEAST SEEN    Culture   Final    CULTURE REINCUBATED FOR BETTER GROWTH Performed at [facility], [place] [address], [place], [place] [address]    Report Status PENDING  Incomplete    Coagulation  Studies: No results for input(s): LABPROT, INR in the last 72 hours.  Urinalysis: No results for input(s): COLORURINE, LABSPEC, PHURINE, GLUCOSEU, HGBUR, BILIRUBINUR, KETONESUR, PROTEINUR, UROBILINOGEN, NITRITE, LEUKOCYTESUR in the last 72 hours.  Invalid input(s): APPERANCEUR    Imaging: DG Abd 1 View  Result Date: 01/21/2021 CLINICAL DATA:  30 year old male positive COVID-19. Decreased bowel sounds. EXAM: ABDOMEN - 1 VIEW COMPARISON:  01/19/2021 and earlier. FINDINGS: Portable AP supine view at 0626 hours. Stable enteric tube, tip at the distal stomach. Right femoral approach catheter appears stable. Continued largely absent bowel gas throughout the abdomen and pelvis. Trace gas in the region of the rectum. No dilated bowel loops are evident. Stable visualized osseous structures. IMPRESSION: 1. Stable enteric tube and right femoral catheter. 2. Continued virtual absence of bowel gas throughout the abdomen and pelvis. No dilated loops are evident, although if there is new abdominal distension then recommend follow-up CT Abdomen and Pelvis as fluid-filled abnormal bowel is difficult to exclude. Electronically Signed   By: Genevie Ann M.D.   On: 01/21/2021 06:49   CARDIAC CATHETERIZATION  Result Date: 01/20/2021 Conclusions: 1. Minimal coronary artery plaquing without angiographically significant coronary artery disease. 2. Moderately reduced left ventricular contraction with global hypokinesis. 3. Mildly elevated left ventricular filling pressure (LVEDP ~20 mmHg). Recommendations: 1. No obvious coronary artery lesion to explain ST segment changes and sustained ventricular tachycardia.  Possible causes include metabolic/electrolyte derangements and myocarditis in the setting of COVID-19. 2. Trend HS-TnI until it has peaked, then stop. 3. Obtain transthoracic echocardiogram. 4. If blood pressure tolerates, consider addition of low-dose beta-blocker. 5. If patient has recurrent VT, consider IV amiodarone. 6.  Replete electrolytes to maintain K > 4.0 and Mg > 2.0. Nelva Bush, MD [facility]   ECHOCARDIOGRAM COMPLETE  Result Date: 01/20/2021    ECHOCARDIOGRAM REPORT   Patient Name:   GALDINO HINCHMAN Date of Exam: 01/20/2021 Medical Rec #:  [id number]    Height:       72.0 in Accession #:    [id number]   Weight:       425.0 lb Date of Birth:  [birth year]    BSA:          2.935 m Patient Age:    29 years     BP:           99/52 mmHg Patient Gender: M            HR:           80 bpm. Exam Location:  [facility] Procedure: 2D Echo, Color Doppler and Cardiac Doppler Indications:    I47.2 Ventricular Tachycardia  History:        Patient has no prior history of Echocardiogram examinations.                 Asthma                 Pt tested positive for COVID-19 on 01/12/21.  Sonographer:    Charmayne Sheer RDCS (AE) Referring Phys: [id number] CHRISTOPHER END Diagnosing      Kate Sable MD Phys:  Sonographer Comments: Echo performed with patient supine and on artificial respirator and suboptimal apical window. Image acquisition challenging due to patient body habitus. IMPRESSIONS  1. Left ventricular ejection fraction, by estimation, is 60 to 65%. The left ventricle has normal function. The left ventricle has no regional wall motion abnormalities. Left ventricular diastolic parameters were normal.  2. Right ventricular systolic function is normal. The right ventricular size is normal.  3. The mitral valve is normal in structure. No evidence of mitral valve regurgitation. No evidence of mitral stenosis.  4. The aortic valve is normal in structure. Aortic valve regurgitation is not visualized. No aortic stenosis is present.  5. The inferior vena cava is normal in size with greater than 50% respiratory variability, suggesting right atrial pressure of 3 mmHg. FINDINGS  Left Ventricle: Left ventricular ejection fraction, by estimation, is 60 to 65%. The left ventricle has normal function. The left ventricle has no regional wall motion  abnormalities. The left ventricular internal cavity size was normal in size. There is  no left ventricular hypertrophy. Left ventricular diastolic parameters were normal. Right Ventricle: The right ventricular size is normal. No increase in right ventricular wall thickness. Right ventricular systolic function is normal. Left Atrium: Left atrial size was normal in size. Right Atrium: Right atrial size was normal in size. Pericardium: There is no evidence of pericardial effusion. Mitral Valve: The mitral valve is normal in structure. No evidence of mitral valve regurgitation. No evidence of mitral valve stenosis. MV peak gradient, 4.2 mmHg. The mean mitral valve gradient is 2.0 mmHg. Tricuspid Valve: The tricuspid valve is normal in structure. Tricuspid valve regurgitation is not demonstrated. No evidence of tricuspid stenosis. Aortic Valve: The aortic valve is normal in structure. Aortic valve regurgitation is not visualized. No aortic stenosis is present. Aortic valve mean gradient measures 4.0 mmHg. Aortic valve peak gradient measures 7.7 mmHg. Aortic valve area, by VTI measures 3.59 cm. Pulmonic Valve: The pulmonic valve was normal in structure. Pulmonic valve regurgitation is not visualized. No evidence of pulmonic stenosis. Aorta: The  aortic root is normal in size and structure. Venous: The inferior vena cava is normal in size with greater than 50% respiratory variability, suggesting right atrial pressure of 3 mmHg. IAS/Shunts: No atrial level shunt detected by color flow Doppler.  LEFT VENTRICLE PLAX 2D LVIDd:         5.10 cm  Diastology LVIDs:         2.90 cm  LV e' medial:    6.53 cm/s LV PW:         1.00 cm  LV E/e' medial:  14.0 LV IVS:        0.90 cm  LV e' lateral:   6.74 cm/s LVOT diam:     2.30 cm  LV E/e' lateral: 13.6 LV SV:         76 LV SV Index:   26 LVOT Area:     4.15 cm  LEFT ATRIUM           Index LA diam:      4.10 cm 1.40 cm/m LA Vol (A4C): 62.0 ml 21.12 ml/m  AORTIC VALVE                    PULMONIC VALVE AV Area (Vmax):    3.23 cm    PV Vmax:       1.54 m/s AV Area (Vmean):   3.44 cm    PV Vmean:      88.800 cm/s AV Area (VTI):     3.59 cm    PV VTI:        0.259 m AV Vmax:           139.00 cm/s PV Peak grad:  9.5 mmHg AV Vmean:          93.100 cm/s PV Mean grad:  4.0 mmHg AV VTI:            0.213 m AV Peak Grad:      7.7 mmHg AV Mean Grad:      4.0 mmHg LVOT Vmax:         108.00 cm/s LVOT Vmean:        77.100 cm/s LVOT VTI:          0.184 m LVOT/AV VTI ratio: 0.86  AORTA Ao Root diam: 3.10 cm MITRAL VALVE MV Area (PHT): 3.56 cm    SHUNTS MV Area VTI:   2.67 cm    Systemic VTI:  0.18 m MV Peak grad:  4.2 mmHg    Systemic Diam: 2.30 cm MV Mean grad:  2.0 mmHg MV Vmax:       1.02 m/s MV Vmean:      58.2 cm/s MV Decel Time: 213 msec MV E velocity: 91.70 cm/s MV A velocity: 39.80 cm/s MV E/A ratio:  2.30 Kate Sable MD Electronically signed by Kate Sable MD Signature Date/Time: 01/20/2021/5:48:37 PM    Final      Medications:   . sodium chloride 5 mL/hr at 01/20/21 0523  . sodium chloride    . dexmedetomidine (PRECEDEX) IV infusion 0.8 mcg/kg/hr (01/21/21 1200)  . dextrose    . erythromycin 250 mg (01/21/21 1225)  . famotidine (PEPCID) IV Stopped (01/21/21 0843)  . fentaNYL infusion INTRAVENOUS 350 mcg/hr (01/21/21 1200)  . heparin 1,800 Units/hr (01/21/21 1200)  . midazolam 2 mg/hr (01/21/21 1200)  . norepinephrine (LEVOPHED) Adult infusion Stopped (01/20/21 0502)  . potassium chloride 10 mEq (01/21/21 1224)   . aspirin  81 mg Per Tube Daily  . atorvastatin  80 mg Per Tube Daily  . chlorhexidine gluconate (MEDLINE KIT)  15 mL Mouth Rinse BID  . Chlorhexidine Gluconate Cloth  6 each Topical Daily  . docusate  100 mg Per Tube BID  . feeding supplement (VITAL 1.5 CAL)  1,000 mL Per Tube Q24H  . insulin aspart  0-20 Units Subcutaneous Q4H  . insulin aspart  5 Units Subcutaneous Q4H  . insulin detemir  20 Units Subcutaneous QHS  . insulin detemir  40 Units  Subcutaneous Daily  . mouth rinse  15 mL Mouth Rinse 10 times per day  . methylPREDNISolone (SOLU-MEDROL) injection  40 mg Intravenous Q24H  . metoCLOPramide  5 mg Per Tube Q8H  . multivitamin  1 tablet Per Tube QHS  . polyethylene glycol  17 g Per Tube Daily  . sodium chloride flush  3 mL Intravenous Q12H  . sodium chloride flush  3 mL Intravenous Q12H   sodium chloride, sodium chloride, acetaminophen (TYLENOL) oral liquid 160 mg/5 mL, dextrose, docusate sodium, fentaNYL, midazolam, midazolam, ondansetron **OR** ondansetron (ZOFRAN) IV, ondansetron (ZOFRAN) IV, polyethylene glycol, sodium chloride flush, sodium chloride flush  Assessment/ Plan:  30 y.o. male with h/o Asthma, no recent medical care , was  admitted on 01/12/2021 for ARDS (adult respiratory distress syndrome) ([place]) [J80] Acute respiratory failure with hypoxia ([place]) [J96.01] Acute hypoxemic respiratory failure due to COVID-19 ([facility]) [U07.1, J96.01] Hyperosmolar hyperglycemic state (HHS) ([place]) [E11.00, E11.65] Pneumonia due to COVID-19 virus [U07.1, J12.82]  # AKI Baseline creatinine is unknown U/a- 6-10 RBCs, 11-20 WBCs, UPC of 0.24 US renal - unremarkable, no obstruction 02/09 0701 - 02/10 0700 In: 2232.8 [I.V.:1877.2; NG/GT:205.5; IV Piggyback:150.1] Out: 1062 [Urine:870; Emesis/NG output:350; Stool:25] -Urine output slowly increasing and found to be 870 cc over the preceding 24 hours.  We will plan for intermittent hemodialysis now.  Previously was on CRRT.  # hypernatremia -Resolved.   # New Dx of Diabetes, type 1 uncontrolled Lab Results  Component Value Date   HGBA1C 13.6 (H) 01/12/2021  Markedly elevated hemoglobin A1c as documented above.  Insulin management as per pulmonary/critical care.  # Acute respiratory failure # Covid 19-infection Has been difficult to wean from the ventilator.  Requiring minimal amounts of oxygen at the moment.  Weaning protocol per pulmonary/critical care.  #Hypokalemia. Serum  potassium improved to 3.3.  Discussed with pharmacy.  We will administer another 40 mEq of potassium chloride.     LOS: 9 Munsoor Lateef 2/10/20221:58 PM  [place], [place]  Note: This note was prepared with Dragon dictation. Any transcription errors are unintentional

## 2021-01-21 NOTE — Progress Notes (Addendum)
2/09-Pt with good response to WUA and SBT w/ 24% FIO2 & 5/5 support.  MD ordered extubation in the afternoon but when RT sat patient up and we told him what we were about to do he passively vomited a moderate amount of thick clear/white emesis.  He vomited again when he was suctioned.  MD opted to abort extubation and resedate patient with full MV support. Pt continues with near absent bowel sounds and little to no stool into rectal tube despite irrigation, laxatives, and TF @ trickle.  2/10 - MD opted to forgo WUA and SBT this shift d/t first session of HD and pt's GI status.  He will be held to bowel rest for 24 hrs and started on erythromycin.  HD was tolerated well and 1.5 liters was pulled off.  Pt follows simple commands even with sedation and sometimes observed randomly opening eyes.  Skin in peri area and buttocks is very sloughy.  Mild oozing of blood observed since heparin was started.  Mother Tonia Ghent updated via phone and she had a video visit with him this shift.

## 2021-01-21 NOTE — Progress Notes (Signed)
Inpatient Diabetes Program Recommendations  AACE/ADA: New Consensus Statement on Inpatient Glycemic Control  Target Ranges:  Prepandial:   less than 140 mg/dL      Peak postprandial:   less than 180 mg/dL (1-2 hours)      Critically ill patients:  140 - 180 mg/dL   Results for KWAME, RYLAND (MRN [id number]) as of 01/21/2021 07:56  Ref. Range 01/20/2021 09:11 01/20/2021 11:32 01/20/2021 15:53 01/20/2021 19:34 01/20/2021 23:57 01/21/2021 04:11  Glucose-Capillary Latest Ref Range: 70 - 99 mg/dL 299 (H) 242 (H) 683 (H) 224 (H) 189 (H) 134 (H)   Review of Glycemic Control  Diabetes history:No Outpatient Diabetes medications:NA Current orders for Inpatient glycemic control:Levemir 40 units daily at 10am, Levemir 20 units QHS, Novolog 0-20 units Q4H, Novolog 5 units Q4H; Solumedrol 40 mg Q24H, Vital @ 15 ml/hr  Inpatient Diabetes Program Recommendations:  Insulin: If steroids are continued as ordered, please consider increasing Levemir to 44 units daily at 10am and Levemir 24 units QHS at 10pm.   Thanks, Orlando Penner, RN, MSN, CDE Diabetes Coordinator Inpatient Diabetes Program [phone] (Team Pager from 8am to 5pm)

## 2021-01-21 NOTE — Progress Notes (Signed)
ANTICOAGULATION CONSULT NOTE  Pharmacy Consult for Heparin Infusion Indication: ACS/STEMI  No Known Allergies  Patient Measurements: Height: 6' (182.9 cm) Weight: (!) 192.8 kg (425 lb) IBW/kg (Calculated) : 77.6 Heparin Dosing Weight: 115.8 kg  Vital Signs: Temp: 99.5 F (37.5 C) (02/10 1415) Temp Source: Bladder (02/10 1245) BP: 159/102 (02/10 1415) Pulse Rate: 104 (02/10 1415)  Labs: Recent Labs    01/20/21 0423 01/20/21 0424 01/20/21 0518 01/20/21 1003 01/20/21 1620 01/20/21 2223 01/20/21 2350 01/21/21 0502 01/21/21 0504 01/21/21 0901  HGB 8.6*  --   --  9.0*  --   --   --  8.9*  --   --   HCT 27.4*  --   --  28.6*  --   --   --  28.0*  --   --   PLT 136*  --   --  138*  --   --   --  197  --   --   HEPARINUNFRC  --   --   --   --   --  0.21*  --   --  0.43  --   CREATININE  --  3.37*  --  3.59*  --   --   --  4.06*  --   --   TROPONINIHS  --   --    < > 4,189* 6,440*  --  6,816*  --   --  4,519*   < > = values in this interval not displayed.    Estimated Creatinine Clearance: 47 mL/min (A) (by C-G formula based on SCr of 4.06 mg/dL (H)).   Medical History: Past Medical History:  Diagnosis Date  . Asthma     Assessment: Pt is 30 yo male admitted on 2/1 for Acute hypoxemic respiratory failure due to COVID. Patient taken to cath lab for STEMI. Consult for heparin drip but it does not appear heparin drip was ever started. Consult to continue heparin for 48 hours.  02/09 2223 HL 0.21, subtherapeutic 02/10 0504 HL 0.43, therapeutic  Goal of Therapy:  Heparin level 0.3-0.7 units/ml Monitor platelets by anticoagulation protocol: Yes   Plan:  Continue heparin drip at 1800 units/hr. Recheck HL at 1500 to confirm. CBC daily while on heparin drip.  Laureen Ochs, PharmD 01/21/2021 2:31 PM

## 2021-01-21 NOTE — Progress Notes (Signed)
Progress Note  Patient Name: Erik [name] Date of Encounter: 01/21/2021  Primary Cardiologist: New to [facility] - consult by End  Subjective   He remains intubated and sedated. Critically ill.   Inpatient Medications    Scheduled Meds: . aspirin  81 mg Per Tube Daily  . atorvastatin  80 mg Per Tube Daily  . chlorhexidine gluconate (MEDLINE KIT)  15 mL Mouth Rinse BID  . Chlorhexidine Gluconate Cloth  6 each Topical Daily  . docusate  100 mg Per Tube BID  . feeding supplement (VITAL 1.5 CAL)  1,000 mL Per Tube Q24H  . insulin aspart  0-20 Units Subcutaneous Q4H  . insulin aspart  5 Units Subcutaneous Q4H  . insulin detemir  20 Units Subcutaneous QHS  . insulin detemir  40 Units Subcutaneous Daily  . mouth rinse  15 mL Mouth Rinse 10 times per day  . methylPREDNISolone (SOLU-MEDROL) injection  40 mg Intravenous Q24H  . metoCLOPramide  5 mg Per Tube Q8H  . multivitamin  1 tablet Per Tube QHS  . polyethylene glycol  17 g Per Tube Daily  . sodium chloride flush  3 mL Intravenous Q12H  . sodium chloride flush  3 mL Intravenous Q12H   Continuous Infusions: . sodium chloride 5 mL/hr at 01/20/21 0523  . sodium chloride    . dexmedetomidine (PRECEDEX) IV infusion 0.8 mcg/kg/hr (01/21/21 1052)  . dextrose    . erythromycin    . famotidine (PEPCID) IV 20 mg (01/21/21 0813)  . fentaNYL infusion INTRAVENOUS 350 mcg/hr (01/21/21 0800)  . heparin 1,800 Units/hr (01/21/21 0800)  . midazolam 2 mg/hr (01/21/21 0800)  . norepinephrine (LEVOPHED) Adult infusion Stopped (01/20/21 0502)  . potassium chloride     PRN Meds: sodium chloride, sodium chloride, acetaminophen (TYLENOL) oral liquid 160 mg/5 mL, dextrose, docusate sodium, fentaNYL, midazolam, midazolam, ondansetron **OR** ondansetron (ZOFRAN) IV, ondansetron (ZOFRAN) IV, polyethylene glycol, sodium chloride flush, sodium chloride flush   Vital Signs    Vitals:   01/21/21 0700 01/21/21 0800 01/21/21 0806 01/21/21 0900  BP: 100/66  103/63    Pulse: 61 (!) 56  69  Resp: (!) 25 (!) 25  (!) 25  Temp: 98.78 F (37.1 C) 98.6 F (37 C)  98.42 F (36.9 C)  TempSrc:      SpO2: 100% 95% 96% 100%  Weight:      Height:        Intake/Output Summary (Last 24 hours) at 01/21/2021 1132 Last data filed at 01/21/2021 0800 Gross per 24 hour  Intake 1989.6 ml  Output 1195 ml  Net 794.6 ml   Filed Weights   01/17/21 0500 01/18/21 0345 01/19/21 0500  Weight: (!) 195.5 kg (!) 195 kg (!) 192.8 kg    Telemetry    SR with PVCs and artifact, 4 beats of NSVT - Personally Reviewed  ECG    No new tracings - Personally Reviewed  Physical Exam   Physical exam deferred in the setting of Covid to minimize exposure.   Labs    Chemistry Recent Labs  Lab 01/15/21 0402 01/15/21 1549 01/16/21 0419 01/16/21 1621 01/17/21 0433 01/17/21 1536 01/19/21 1551 01/20/21 0424 01/20/21 1003 01/20/21 2155 01/21/21 0502  NA 144   < > 139   < > 137   < > 137 138  --   --  139  K 4.5   < > 4.4   < > 3.8   < > 3.4* 2.8*  --  4.1 3.3*  CL 113*   < > 105   < > 102   < > 102 103  --   --  107  CO2 23   < > 22   < > 23   < > 22 23  --   --  20*  GLUCOSE 213*   < > 256*   < > 245*   < > 200* 135*  --   --  141*  BUN 80*   < > 74*   < > 64*   < > 57* 51*  --   --  62*  CREATININE 6.56*   < > 5.83*   < > 4.88*   < > 3.42* 3.37* 3.59*  --  4.06*  CALCIUM 7.5*   < > 7.8*   < > 8.2*   < > 8.7* 8.2*  --   --  8.4*  PROT 5.8*  --  5.3*  --  5.4*  --   --   --   --   --   --   ALBUMIN 1.8*   < > 1.7*   < > 1.7*   < > 2.1* 1.9*  --   --  2.0*  AST 30  --  30  --  26  --   --   --   --   --   --   ALT 17  --  20  --  21  --   --   --   --   --   --   ALKPHOS 66  --  71  --  74  --   --   --   --   --   --   BILITOT 0.5  --  0.9  --  0.6  --   --   --   --   --   --   GFRNONAA 11*   < > 13*   < > 16*   < > 24* 24* 23*  --  19*  ANIONGAP 8   < > 12   < > 12   < > 13 12  --   --  12   < > = values in this interval not displayed.      Hematology Recent Labs  Lab 01/20/21 0423 01/20/21 1003 01/21/21 0502  WBC 15.0* 18.0* 17.1*  RBC 3.32* 3.46* 3.41*  HGB 8.6* 9.0* 8.9*  HCT 27.4* 28.6* 28.0*  MCV 82.5 82.7 82.1  MCH 25.9* 26.0 26.1  MCHC 31.4 31.5 31.8  RDW 14.1 14.4 14.7  PLT 136* 138* 197    Cardiac EnzymesNo results for input(s): TROPONINI in the last 168 hours. No results for input(s): TROPIPOC in the last 168 hours.   BNP Recent Labs  Lab 01/17/21 1536  BNP 60.4     DDimer No results for input(s): DDIMER in the last 168 hours.   Radiology    DG Abd 1 View  Result Date: 01/21/2021 IMPRESSION: 1. Stable enteric tube and right femoral catheter. 2. Continued virtual absence of bowel gas throughout the abdomen and pelvis. No dilated loops are evident, although if there is new abdominal distension then recommend follow-up CT Abdomen and Pelvis as fluid-filled abnormal bowel is difficult to exclude. Electronically Signed   By: Genevie Ann M.D.   On: 01/21/2021 06:49   Cardiac Studies   LHC 01/20/2021: Conclusions: 1. Minimal coronary artery plaquing without angiographically significant coronary artery disease. 2. Moderately reduced left ventricular contraction with  global hypokinesis. 3. Mildly elevated left ventricular filling pressure (LVEDP ~20 mmHg).  Recommendations: 1. No obvious coronary artery lesion to explain ST segment changes and sustained ventricular tachycardia.  Possible causes include metabolic/electrolyte derangements and myocarditis in the setting of COVID-19. 2. Trend HS-TnI until it has peaked, then stop. 3. Obtain transthoracic echocardiogram. 4. If blood pressure tolerates, consider addition of low-dose beta-blocker. 5. If patient has recurrent VT, consider IV amiodarone. 6. Replete electrolytes to maintain K > 4.0 and Mg > 2.0. __________  2D echo 01/20/2021: 1. Left ventricular ejection fraction, by estimation, is 60 to 65%. The  left ventricle has normal function. The left  ventricle has no regional  wall motion abnormalities. Left ventricular diastolic parameters were  normal.  2. Right ventricular systolic function is normal. The right ventricular  size is normal.  3. The mitral valve is normal in structure. No evidence of mitral valve  regurgitation. No evidence of mitral stenosis.  4. The aortic valve is normal in structure. Aortic valve regurgitation is  not visualized. No aortic stenosis is present.  5. The inferior vena cava is normal in size with greater than 50%  respiratory variability, suggesting right atrial pressure of 3 mmHg.   Patient Profile     30 y.o. male with history of asthma and morbid obesity admitted with COVID PNA complicated by AKI, inferior STEMI, and VT.   Assessment & Plan    1. Inferior STEMI: -No culprit lesion on LHC 01/20/2021 -Cannot exclude myocarditis in the setting of his Covid infection  -Echo with normal LVSF, normal LV diastolic function, no RWMA, normal RVSF and ventricular cavity size, and an estimated right atrial pressure of 3 -HS-Tn peaked at 6816 and [place] currently down trending -Heparin gtt for 48-72 hours as long as HGB/PLT allow -ASA -Lipitor  2. VT: -No sustained runs -Would add beta blocker if BP allows -For recurrent VT, consider amiodarone  -Maintain potassium and magnesium at goal 4.0 and 2.0 respectively (potassium to be managed with CRRT)   3. Anemia: -Low though stable -Per primary service   4. AKI: -He has been receiving CRRT -There is a high likelihood for permanent renal failure with IV contrast with risks and benefits having been discussed with his mother by interventional cardiology   5. ARDS/acute hypoxic and hypercapnic respiratory failure secondary to COVID PNA with multiorgan failure: -He remains critically ill -Management per CCM    For questions or updates, please contact [place] Please consult [email or web address] for contact info under Cardiology/STEMI.     Signed, Christell Faith, PA-C [place] Pager: [phone] 01/21/2021, 11:32 AM

## 2021-01-22 DIAGNOSIS — J9601 Acute respiratory failure with hypoxia: Secondary | ICD-10-CM

## 2021-01-22 LAB — CBC WITH DIFFERENTIAL/PLATELET
Abs Immature Granulocytes: 1.41 10*3/uL — ABNORMAL HIGH (ref 0.00–0.07)
Basophils Absolute: 0 10*3/uL (ref 0.0–0.1)
Basophils Relative: 0 %
Eosinophils Absolute: 0 10*3/uL (ref 0.0–0.5)
Eosinophils Relative: 0 %
HCT: 25.8 % — ABNORMAL LOW (ref 39.0–52.0)
Hemoglobin: 8 g/dL — ABNORMAL LOW (ref 13.0–17.0)
Immature Granulocytes: 10 %
Lymphocytes Relative: 15 %
Lymphs Abs: 2 10*3/uL (ref 0.7–4.0)
MCH: 25.9 pg — ABNORMAL LOW (ref 26.0–34.0)
MCHC: 31 g/dL (ref 30.0–36.0)
MCV: 83.5 fL (ref 80.0–100.0)
Monocytes Absolute: 1.8 10*3/uL — ABNORMAL HIGH (ref 0.1–1.0)
Monocytes Relative: 13 %
Neutro Abs: 8.4 10*3/uL — ABNORMAL HIGH (ref 1.7–7.7)
Neutrophils Relative %: 62 %
Platelets: 239 10*3/uL (ref 150–400)
RBC: 3.09 MIL/uL — ABNORMAL LOW (ref 4.22–5.81)
RDW: 14.7 % (ref 11.5–15.5)
Smear Review: NORMAL
WBC: 13.7 10*3/uL — ABNORMAL HIGH (ref 4.0–10.5)
nRBC: 0.3 % — ABNORMAL HIGH (ref 0.0–0.2)

## 2021-01-22 LAB — GLUCOSE, CAPILLARY
Glucose-Capillary: 118 mg/dL — ABNORMAL HIGH (ref 70–99)
Glucose-Capillary: 139 mg/dL — ABNORMAL HIGH (ref 70–99)
Glucose-Capillary: 142 mg/dL — ABNORMAL HIGH (ref 70–99)
Glucose-Capillary: 143 mg/dL — ABNORMAL HIGH (ref 70–99)
Glucose-Capillary: 202 mg/dL — ABNORMAL HIGH (ref 70–99)
Glucose-Capillary: 216 mg/dL — ABNORMAL HIGH (ref 70–99)
Glucose-Capillary: 249 mg/dL — ABNORMAL HIGH (ref 70–99)

## 2021-01-22 LAB — CULTURE, RESPIRATORY W GRAM STAIN

## 2021-01-22 LAB — RENAL FUNCTION PANEL
Albumin: 2.1 g/dL — ABNORMAL LOW (ref 3.5–5.0)
Anion gap: 11 (ref 5–15)
BUN: 51 mg/dL — ABNORMAL HIGH (ref 6–20)
CO2: 21 mmol/L — ABNORMAL LOW (ref 22–32)
Calcium: 8.3 mg/dL — ABNORMAL LOW (ref 8.9–10.3)
Chloride: 107 mmol/L (ref 98–111)
Creatinine, Ser: 3.39 mg/dL — ABNORMAL HIGH (ref 0.61–1.24)
GFR, Estimated: 24 mL/min — ABNORMAL LOW (ref >60)
Glucose, Bld: 128 mg/dL — ABNORMAL HIGH (ref 70–99)
Phosphorus: 5.7 mg/dL — ABNORMAL HIGH (ref 2.5–4.6)
Potassium: 3.9 mmol/L (ref 3.5–5.1)
Sodium: 139 mmol/L (ref 135–145)

## 2021-01-22 LAB — C-REACTIVE PROTEIN: CRP: 11.6 mg/dL — ABNORMAL HIGH (ref <1.0)

## 2021-01-22 LAB — LIPID PANEL
Cholesterol: 167 mg/dL (ref 0–200)
HDL: 33 mg/dL — ABNORMAL LOW (ref >40)
LDL Cholesterol: 90 mg/dL (ref 0–99)
Total CHOL/HDL Ratio: 5.1 RATIO
Triglycerides: 222 mg/dL — ABNORMAL HIGH (ref <150)
VLDL: 44 mg/dL — ABNORMAL HIGH (ref 0–40)

## 2021-01-22 LAB — HEPARIN LEVEL (UNFRACTIONATED)
Heparin Unfractionated: 0.25 IU/mL — ABNORMAL LOW (ref 0.30–0.70)
Heparin Unfractionated: 0.19 IU/mL — ABNORMAL LOW (ref 0.30–0.70)

## 2021-01-22 LAB — FERRITIN: Ferritin: 658 ng/mL — ABNORMAL HIGH (ref 24–336)

## 2021-01-22 LAB — FIBRIN DERIVATIVES D-DIMER (ARMC ONLY): Fibrin derivatives D-dimer (ARMC): 1542.46 ng/mL (FEU) — ABNORMAL HIGH (ref 0.00–499.00)

## 2021-01-22 LAB — MAGNESIUM: Magnesium: 2.2 mg/dL (ref 1.7–2.4)

## 2021-01-22 MED ORDER — HEPARIN (PORCINE) 25000 UT/250ML-% IV SOLN
1650.0000 [IU]/h | INTRAVENOUS | Status: DC
  Administered 2021-01-22 (×2): 1650 [IU]/h via INTRAVENOUS
  Filled 2021-01-22: qty 250

## 2021-01-22 MED ORDER — HEPARIN BOLUS VIA INFUSION
1700.0000 [IU] | Freq: Once | INTRAVENOUS | Status: AC
  Administered 2021-01-22: 1700 [IU] via INTRAVENOUS
  Filled 2021-01-22: qty 1700

## 2021-01-22 MED ORDER — INSULIN DETEMIR 100 UNIT/ML ~~LOC~~ SOLN
24.0000 [IU] | Freq: Every day | SUBCUTANEOUS | Status: DC
  Administered 2021-01-22 – 2021-01-24 (×3): 24 [IU] via SUBCUTANEOUS
  Filled 2021-01-22 (×4): qty 0.24

## 2021-01-22 MED ORDER — INSULIN DETEMIR 100 UNIT/ML ~~LOC~~ SOLN
44.0000 [IU] | Freq: Every day | SUBCUTANEOUS | Status: DC
  Administered 2021-01-23 – 2021-01-24 (×2): 44 [IU] via SUBCUTANEOUS
  Filled 2021-01-22 (×3): qty 0.44

## 2021-01-22 MED ORDER — HEPARIN SODIUM (PORCINE) 5000 UNIT/ML IJ SOLN
5000.0000 [IU] | Freq: Three times a day (TID) | INTRAMUSCULAR | Status: DC
  Administered 2021-01-22 – 2021-01-25 (×8): 5000 [IU] via SUBCUTANEOUS
  Filled 2021-01-22 (×8): qty 1

## 2021-01-22 MED ORDER — METOPROLOL TARTRATE 25 MG PO TABS
12.5000 mg | ORAL_TABLET | Freq: Two times a day (BID) | ORAL | Status: DC
  Filled 2021-01-22: qty 1

## 2021-01-22 NOTE — Progress Notes (Addendum)
PHARMACY CONSULT NOTE   Pharmacy Consult for Electrolyte Monitoring and Replacement   Recent Labs: Potassium (mmol/L)  Date Value  01/22/2021 3.9   Magnesium (mg/dL)  Date Value  15/61/5379 2.2   Calcium (mg/dL)  Date Value  43/27/6147 8.3 (L)   Albumin (g/dL)  Date Value  09/09/5746 2.1 (L)   Phosphorus (mg/dL)  Date Value  34/02/7095 5.7 (H)   Sodium (mmol/L)  Date Value  01/22/2021 139    Assessment: 30 year old male admitted with COVID-19 pneumonia and DKA. Started on an insulin drip. Patient with hypoxic respiratory failure requiring intubation 2/1. Patient hypotensive and requiring blood pressure support with Levophed. Patient remains intubated sedated and on mechanical ventilation in the ICU. Pharmacy to manage electrolytes.  Nephrology following. Previously on CRRT which was stopped 2/9. Following daily for HD needs.  Goal of Therapy:  Electrolytes WNL  Plan:  Electrolytes stable. Defer replacement today. Tube feeds have been on hold for bowel rest. Continue to follow closely.  Pricilla Riffle, PharmD 01/22/2021 2:31 PM

## 2021-01-22 NOTE — Progress Notes (Signed)
CRITICAL CARE NOTE  30 yo morbidly obese male with ASTHMA now with progressive multiorgan failure with COVID 19 pneumonia and ARDS with severe acidosis and severe hypoxia with ARDS with acute renal failure  2/1 Emergently intubated 2/2 severe resp failure, vasc cath placed 2/3 severe resp failure 01/15/2021-patient is critically Ill, CRRT continued. Borderline low blood glucose today.  01/16/2021-patient with thickened heavy ETT secretions. RN reporting inadequate sedation with patient making motions to remove airway, we have advanced RASS to range -3 to -4. Remains critically ill with ongoing renal replacement therapy and COVID treatment.  01/17/2021-patient had thickened secretions again today, he was lavaged with saline by RT today with good response. We will try awakening trial again in AM.  01/18/2021-remains on vent, on CRRT 01/19/2021-acute MI 2/9 s/p cath, multiorgan failure +vomiting 2/10 FAILED WEANING TRIALS   CC  follow up respiratory failure  SUBJECTIVE Patient remains critically ill Prognosis is guarded  Vent Mode: PRVC FiO2 (%):  [24 %] 24 % Set Rate:  [25 bmp] 25 bmp Vt Set:  [550 mL] 550 mL PEEP:  [5 cmH20] 5 cmH20 Plateau Pressure:  [18 cmH20-24 cmH20] 24 cmH20 CBC    Component Value Date/Time   WBC 13.7 (H) 01/22/2021 0526   RBC 3.09 (L) 01/22/2021 0526   HGB 8.0 (L) 01/22/2021 0526   HCT 25.8 (L) 01/22/2021 0526   PLT 239 01/22/2021 0526   MCV 83.5 01/22/2021 0526   MCH 25.9 (L) 01/22/2021 0526   MCHC 31.0 01/22/2021 0526   RDW 14.7 01/22/2021 0526   LYMPHSABS 2.0 01/22/2021 0526   MONOABS 1.8 (H) 01/22/2021 0526   EOSABS 0.0 01/22/2021 0526   BASOSABS 0.0 01/22/2021 0526   BMP Latest Ref Rng & Units 01/22/2021 01/21/2021 01/20/2021  Glucose 70 - 99 mg/dL 128(H) 141(H) -  BUN 6 - 20 mg/dL 51(H) 62(H) -  Creatinine 0.61 - 1.24 mg/dL 3.39(H) 4.06(H) -  Sodium 135 - 145 mmol/L 139 139 -  Potassium 3.5 - 5.1 mmol/L 3.9 3.3(L) 4.1  Chloride 98 -  111 mmol/L 107 107 -  CO2 22 - 32 mmol/L 21(L) 20(L) -  Calcium 8.9 - 10.3 mg/dL 8.3(L) 8.4(L) -    BP 129/78   Pulse 65   Temp 99.5 F (37.5 C)   Resp (!) 25   Ht 6' (1.829 m)   Wt (!) 194.6 kg   SpO2 100%   BMI 58.18 kg/m    I/O last 3 completed shifts: In: 3800.4 [I.V.:3212; NG/GT:85; IV Piggyback:503.4] Out: 3320 [Urine:1295; Emesis/NG output:500; Other:1500; Stool:25] Total I/O In: 577.5 [I.V.:329.7; Other:125; IV Piggyback:122.9] Out: 125 [Urine:125]  SpO2: 100 % O2 Flow Rate (L/min): 7 L/min FiO2 (%): 24 %  Estimated body mass index is 58.18 kg/m as calculated from the following:   Height as of this encounter: 6' (1.829 m).   Weight as of this encounter: 194.6 kg.  SIGNIFICANT EVENTS   REVIEW OF SYSTEMS  PATIENT IS UNABLE TO PROVIDE COMPLETE REVIEW OF SYSTEMS DUE TO SEVERE CRITICAL ILLNESS       PHYSICAL EXAMINATION: GENERAL:critically ill appearing, +resp distress NECK: Supple.  PULMONARY: +rhonchi, +wheezing CARDIOVASCULAR: S1 and S2.  GASTROINTESTINAL: Soft, nontender, +Positive bowel sounds.  MUSCULOSKELETAL: No swelling, clubbing, or edema.  NEUROLOGIC: obtunded, GCS<8 SKIN:intact,warm,dry     MEDICATIONS: I have reviewed all medications and confirmed regimen as documented   CULTURE RESULTS   Recent Results (from the past 240 hour(s))  Culture, blood (single)     Status: None   Collection Time:  01/12/21  1:08 PM   Specimen: BLOOD  Result Value Ref Range Status   Specimen Description BLOOD BLOOD RIGHT HAND  Final   Special Requests   Final    BOTTLES DRAWN AEROBIC AND ANAEROBIC Blood Culture adequate volume   Culture   Final    NO GROWTH 5 DAYS Performed at [facility], [address] [place], [place] [address]    Report Status 01/17/2021 FINAL  Final  MRSA PCR Screening     Status: None   Collection Time: 01/12/21  7:22 PM   Specimen: Nasopharyngeal  Result Value Ref Range Status   MRSA by PCR NEGATIVE NEGATIVE  Final    Comment:        The GeneXpert MRSA Assay (FDA approved for NASAL specimens only), is one component of a comprehensive MRSA colonization surveillance program. It is not intended to diagnose MRSA infection nor to guide or monitor treatment for MRSA infections. Performed at [facility], [address] [place], [place] [address]   Culture, respiratory (non-expectorated)     Status: None   Collection Time: 01/13/21  3:04 PM   Specimen: Tracheal Aspirate; Respiratory  Result Value Ref Range Status   Specimen Description   Final    TRACHEAL ASPIRATE Performed at [facility], [address], [place], [place] [address]    Special Requests   Final    NONE Performed at [facility], [address] [place], [place] [address]    Gram Stain   Final    ABUNDANT WBC PRESENT, PREDOMINANTLY PMN MODERATE GRAM POSITIVE COCCI Performed at [facility], [place] [address] [place], [place] [address]    Culture FEW STAPHYLOCOCCUS AUREUS  Final   Report Status 01/16/2021 FINAL  Final   Organism ID, Bacteria STAPHYLOCOCCUS AUREUS  Final      Susceptibility   Staphylococcus aureus - MIC*    CIPROFLOXACIN <=0.5 SENSITIVE Sensitive     ERYTHROMYCIN <=0.25 SENSITIVE Sensitive     GENTAMICIN <=0.5 SENSITIVE Sensitive     OXACILLIN 0.5 SENSITIVE Sensitive     TETRACYCLINE <=1 SENSITIVE Sensitive     VANCOMYCIN 1 SENSITIVE Sensitive     TRIMETH/SULFA <=10 SENSITIVE Sensitive     CLINDAMYCIN <=0.25 SENSITIVE Sensitive     RIFAMPIN <=0.5 SENSITIVE Sensitive     Inducible Clindamycin NEGATIVE Sensitive     * FEW STAPHYLOCOCCUS AUREUS  Culture, respiratory (non-expectorated)     Status: None   Collection Time: 01/19/21 10:41 PM   Specimen: Tracheal Aspirate; Respiratory  Result Value Ref Range Status   Specimen Description   Final    TRACHEAL ASPIRATE Performed at [facility], [address] [place], [place] [address]    Special Requests    Final    NONE Performed at [facility], [address] [place], [place] [address]    Gram Stain   Final    FEW WBC PRESENT,BOTH PMN AND MONONUCLEAR RARE BUDDING YEAST SEEN Performed at [facility], [place] [address], [place], [place] [address]    Culture FEW CANDIDA ALBICANS  Final   Report Status 01/22/2021 FINAL  Final          IMAGING    No results found.   Nutrition Status: Nutrition Problem: Inadequate oral intake Etiology: inability to eat Signs/Symptoms: NPO status Interventions: Tube feeding,Prostat     Indwelling Urinary Catheter continued, requirement due to   Reason to continue Indwelling Urinary Catheter strict Intake/Output monitoring for hemodynamic instability  Central Line/ continued, requirement due to  Reason to continue [employer] of central venous pressure or other hemodynamic parameters and poor IV access   Ventilator continued, requirement due to severe respiratory failure   Ventilator Sedation RASS 0 to -2      ASSESSMENT AND PLAN SYNOPSIS   29 YO MORBIDLY OBESE AAM WITH SEVERE ARDS DUE TO COVID 19  PNEUMONIA WITH ACUTE RENAL FAILURE AND MULTIORGAN AIL FAILURE COMPLICATED BY FAILURE TO WEAN FROM VENT WITH ACUTE ABD ILEUS  Plan for BOWEL REST AND HD TOMORROW AM AND THEN PLAN FOR SAT/SBT and ASSESS FOR EXTUBATION    Acute hypoxemic respiratory failure due to COVID-19 pneumonia/ARDS Mechanical ventilation via ARDS protocol, target PRVC 6 cc/kg Wean PEEP and FiO2 as able Goal plateau pressure less than 30, driving pressure less than 15 VAP prevention order set IV STEROIDS Therapy Follow inflammatory markers as needed with CRP Vitamin C, zinc Plan to repeat and check resp cultures as needed   Severe ACUTE Hypoxic and Hypercapnic Respiratory Failure -continue Full MV support -continue Bronchodilator Therapy -Wean Fio2 and PEEP as tolerated -will perform SAT/SBT when respiratory parameters are  met -VAP/VENT bundle implementation  ACUTE DIASTOLIC CARDIAC FAILURE-  -oxygen as needed -Lasix as tolerated   Morbid obesity, possible OSA.   Will certainly impact respiratory mechanics, ventilator weaning Suspect will need to consider additional PEEP   ACUTE KIDNEY INJURY/Renal Failure -continue Foley Catheter-assess need -Avoid nephrotoxic agents -Follow urine output, BMP -Ensure adequate renal perfusion, optimize oxygenation -Renal dose medications HD as needed     NEUROLOGY Acute toxic metabolic encephalopathy, need for sedation Goal RASS -2 to -3  CARDIAC ICU monitoring    GI GI PROPHYLAXIS as indicated   DIET-->NPO for now Constipation protocol as indicated  ENDO - will use ICU hypoglycemic\Hyperglycemia protocol if indicated    ELECTROLYTES -follow labs as needed -replace as needed -pharmacy consultation and following   DVT/GI PRX ordered and assessed TRANSFUSIONS AS NEEDED MONITOR FSBS I Assessed the need for Labs I Assessed the need for Foley I Assessed the need for Central Venous Line Family Discussion when available I Assessed the need for Mobilization I made an Assessment of medications to be adjusted accordingly Safety Risk assessment completed   CASE DISCUSSED IN MULTIDISCIPLINARY ROUNDS WITH ICU TEAM  Critical Care Time devoted to patient care services described in this note is 47  minutes.   Overall, patient is critically ill, prognosis is guarded.  Patient with Multiorgan failure and at high risk for cardiac arrest and death.    Corrin Parker, M.D.  [facility]  Medical Director [place] Director [facility] Cardio-Pulmonary Department

## 2021-01-22 NOTE — Progress Notes (Incomplete)
CRITICAL CARE NOTE 30 yo morbidly obese male with ASTHMA now with progressive multiorgan failure with COVID 19 pneumonia and ARDS with severe acidosis and severe hypoxia with ARDS with acute renal failure  2/1 Emergently intubated 2/2 severe resp failure, vasc cath placed 2/3 severe resp failure 01/15/2021-patient is critically Ill, CRRT continued. Borderline low blood glucose today.  01/16/2021-patient with thickened heavy ETT secretions. RN reporting inadequate sedation with patient making motions to remove airway, we have advanced RASS to range -3 to -4. Remains critically ill with ongoing renal replacement therapy and COVID treatment.  01/17/2021-patient had thickened secretions again today, he was lavaged with saline by RT today with good response. We will try awakening trial again in AM.  01/18/2021-remains on vent, on CRRT 01/19/2021-acute MI 2/9 s/p cath, multiorgan failure +vomiting 2/10 Failed weaning trials with emesis, Bowel rest for ileus,    CC   follow up respiratory failure  SUBJECTIVE:  Remains on vent, Off CRRT   BP 138/72 (BP Location: Left Wrist)   Pulse 66   Temp 99.5 F (37.5 C) (Bladder)   Resp (!) 25   Ht 6' (1.829 m)   Wt (!) 194.6 kg   SpO2 100%   BMI 58.18 kg/m    I/O last 3 completed shifts: In: 3800.4 [I.V.:3212; NG/GT:85; IV Piggyback:503.4] Out: 3320 [Urine:1295; Emesis/NG output:500; Other:1500; Stool:25] Total I/O In: 388.9 [I.V.:163.9; Other:125; IV Piggyback:100] Out: 0   SpO2: 100 % O2 Flow Rate (L/min): 7 L/min FiO2 (%): 24 %  Estimated body mass index is 58.18 kg/m as calculated from the following:   Height as of this encounter: 6' (1.829 m).   Weight as of this encounter: 194.6 kg.  SIGNIFICANT EVENTS   REVIEW OF SYSTEMS  PATIENT IS UNABLE TO PROVIDE COMPLETE REVIEW OF SYSTEMS DUE TO SEVERE CRITICAL ILLNESS       PHYSICAL EXAMINATION: Gen:      Critically ill apprearing HEENT:  EOMI, sclera anicteric Neck:      No masses; no thyromegaly, ETT Lungs:    Clear to auscultation bilaterally; normal respiratory effort CV:         Regular rate and rhythm; no murmurs Abd:      + bowel sounds; soft, non-tender; no palpable masses, no distension Ext:    No edema; adequate peripheral perfusion Skin:      Warm and dry; no rash Neuro: Sedated  Labs , imaging reviewed significant for   MEDICATIONS: I have reviewed all medications and confirmed regimen as documented   CULTURE RESULTS   Recent Results (from the past 240 hour(s))  Blood culture (routine single)     Status: None   Collection Time: 01/12/21  9:05 AM   Specimen: BLOOD  Result Value Ref Range Status   Specimen Description BLOOD LEFT AC  Final   Special Requests   Final    BOTTLES DRAWN AEROBIC ONLY Blood Culture results may not be optimal due to an inadequate volume of blood received in culture bottles   Culture   Final    NO GROWTH 5 DAYS Performed at [facility], [address], [place], [place] [address]    Report Status 01/17/2021 FINAL  Final  SARS Coronavirus 2 by RT PCR (hospital order, performed in [facility] hospital lab) Nasopharyngeal Nasopharyngeal Swab     Status: Abnormal   Collection Time: 01/12/21  9:36 AM   Specimen: Nasopharyngeal Swab  Result Value Ref Range Status   SARS Coronavirus 2 POSITIVE (A) NEGATIVE Final  Comment: RESULT CALLED TO, READ BACK BY AND VERIFIED WITH: ANNA JASPER ON 01/12/21 AT 1111 SDR (NOTE) SARS-CoV-2 target nucleic acids are DETECTED  SARS-CoV-2 RNA is generally detectable in upper respiratory specimens  during the acute phase of infection.  Positive results are indicative  of the presence of the identified virus, but do not rule out bacterial infection or co-infection with other pathogens not detected by the test.  Clinical correlation with patient history and  other diagnostic information is necessary to determine patient infection status.  The expected result is  negative.  Fact Sheet for Patients:   BoilerBrush.com.cy   Fact Sheet for Healthcare Providers:   https://pope.com/    This test is not yet approved or cleared by the [place] FDA and  has been authorized for detection and/or diagnosis of SARS-CoV-2 by FDA under an Emergency Use Authorization (EUA).  This EUA will remain in effect (meaning this tes t can be used) for the duration of  the COVID-19 declaration under Section 564(b)(1) of the Act, 21 U.S.C. section 360-bbb-3(b)(1), unless the authorization is terminated or revoked sooner.  Performed at [facility], [address] [place], [place] [address]   Culture, blood (single)     Status: None   Collection Time: 01/12/21  1:08 PM   Specimen: BLOOD  Result Value Ref Range Status   Specimen Description BLOOD BLOOD RIGHT HAND  Final   Special Requests   Final    BOTTLES DRAWN AEROBIC AND ANAEROBIC Blood Culture adequate volume   Culture   Final    NO GROWTH 5 DAYS Performed at [facility], [address] [place], [place] [address]    Report Status 01/17/2021 FINAL  Final  MRSA PCR Screening     Status: None   Collection Time: 01/12/21  7:22 PM   Specimen: Nasopharyngeal  Result Value Ref Range Status   MRSA by PCR NEGATIVE NEGATIVE Final    Comment:        The GeneXpert MRSA Assay (FDA approved for NASAL specimens only), is one component of a comprehensive MRSA colonization surveillance program. It is not intended to diagnose MRSA infection nor to guide or monitor treatment for MRSA infections. Performed at [facility], [address] [place], [place] [address]   Culture, respiratory (non-expectorated)     Status: None   Collection Time: 01/13/21  3:04 PM   Specimen: Tracheal Aspirate; Respiratory  Result Value Ref Range Status   Specimen Description   Final    TRACHEAL ASPIRATE Performed at [facility], [address] [place], [place] [address]    Special Requests   Final    NONE Performed at [facility], [address] [place], [place] [address]    Gram Stain   Final    ABUNDANT WBC PRESENT, PREDOMINANTLY PMN MODERATE GRAM POSITIVE COCCI Performed at [facility], [address] [place], [place] [address]    Culture FEW STAPHYLOCOCCUS AUREUS  Final   Report Status 01/16/2021 FINAL  Final   Organism ID, Bacteria STAPHYLOCOCCUS AUREUS  Final      Susceptibility   Staphylococcus aureus - MIC*    CIPROFLOXACIN <=0.5 SENSITIVE Sensitive     ERYTHROMYCIN <=0.25 SENSITIVE Sensitive     GENTAMICIN <=0.5 SENSITIVE Sensitive     OXACILLIN 0.5 SENSITIVE Sensitive     TETRACYCLINE <=1 SENSITIVE Sensitive     VANCOMYCIN 1 SENSITIVE Sensitive     TRIMETH/SULFA <=10 SENSITIVE Sensitive  CLINDAMYCIN <=0.25 SENSITIVE Sensitive     RIFAMPIN <=0.5 SENSITIVE Sensitive     Inducible Clindamycin NEGATIVE Sensitive     * FEW STAPHYLOCOCCUS AUREUS  Culture, respiratory (non-expectorated)     Status: None (Preliminary result)   Collection Time: 01/19/21 10:41 PM   Specimen: Tracheal Aspirate; Respiratory  Result Value Ref Range Status   Specimen Description   Final    TRACHEAL ASPIRATE Performed at [facility], [address], [place], [place] [address]    Special Requests   Final    NONE Performed at [facility], [address] [place], [place] [address]    Gram Stain   Final    FEW WBC PRESENT,BOTH PMN AND MONONUCLEAR RARE BUDDING YEAST SEEN Performed at [facility], [address], [place], [place] [address]    Culture FEW YEAST  Final   Report Status PENDING  Incomplete          IMAGING    No results found.   Nutrition Status: Nutrition Problem: Inadequate oral intake Etiology: inability to eat Signs/Symptoms: NPO status Interventions: Tube feeding,Prostat     Indwelling Urinary Catheter continued, requirement due to   Reason to  continue Indwelling Urinary Catheter strict Intake/Output monitoring for hemodynamic instability   Central Line/ continued, requirement due to  Reason to continue [employer] Monitoring of central venous pressure or other hemodynamic parameters and poor IV access   Ventilator continued, requirement due to severe respiratory failure   Ventilator Sedation RASS 0 to -2      ASSESSMENT AND PLAN SYNOPSIS  29 YO MORBIDLY OBESE AAM WITH SEVERE ARDS DUE TO COVID 19  PNEUMONIA WITH ACUTE RENAL FAILURE AND MULTIORGAN AIL FAILURE COMPLICATED BY FAILURE TO WEAN FROM VENT WITH ACUTE ABD ILEUS  Acute hypoxemic respiratory failure due to COVID-19 pneumonia/ARDS Mechanical ventilation via ARDS protocol, target PRVC 6 cc/kg Wean PEEP and FiO2 as able Goal plateau pressure less than 30, driving pressure less than 15 Paralytics if necessary for vent synchrony, gas exchange Deep sedation per PAD protocol Diuresis as tolerated based on Kidney function VAP prevention order set Remdesivir Therapy IV STEROIDS Therapy Follow inflammatory markers as needed with CRP Vitamin C, zinc Plan to repeat and check resp cultures as needed   Severe ACUTE Hypoxic and Hypercapnic Respiratory Failure -continue Full MV support -continue Bronchodilator Therapy -Wean Fio2 and PEEP as tolerated -VAP/VENT bundle implementation  ACUTE DIASTOLIC CARDIAC FAILURE-  -oxygen as needed -Lasix as tolerated   Morbid obesity, possible OSA.   Will certainly impact respiratory mechanics, ventilator weaning Suspect will need to consider additional PEEP   ACUTE KIDNEY INJURY/Renal Failure -continue Foley Catheter-assess need -Avoid nephrotoxic agents -Follow urine output, BMP -Ensure adequate renal perfusion, optimize oxygenation -Renal dose medications HD AS NEEDED    NEUROLOGY Acute toxic metabolic encephalopathy, need for sedation Goal RASS -2 to -3    CARDIAC ICU monitoring  ID -continue IV abx as  prescibed -follow up cultures  GI GI PROPHYLAXIS as indicated   DIET-->hold TF +signs of ileus, start erythromycin for gut motility Constipation protocol as indicated  ENDO - will use ICU hypoglycemic\Hyperglycemia protocol if indicated    ELECTROLYTES -follow labs as needed -replace as needed -pharmacy consultation and following   DVT/GI PRX ordered and assessed TRANSFUSIONS AS NEEDED MONITOR FSBS I Assessed the need for Labs I Assessed the need for Foley I Assessed the need for Central Venous Line Family Discussion when available I Assessed the need for Mobilization I made  an Assessment of medications to be adjusted accordingly Safety Risk assessment completed   CASE DISCUSSED IN MULTIDISCIPLINARY ROUNDS WITH ICU TEAM  Critical Care Time devoted to patient care services described in this note is 55 minutes.   Overall, patient is critically ill, prognosis is guarded.  Patient with Multiorgan failure and at high risk for cardiac arrest and death.    Lucie Leather, M.D.  [facility]  Medical Director [facility] Medical Director [facility] Cardio-[facility] Department

## 2021-01-22 NOTE — Progress Notes (Signed)
Bleeding at femoral trialysis catheter site. Saturated dressing removed. Clot formation at insertion site. Reinforced gauze dressing applied.

## 2021-01-22 NOTE — Progress Notes (Signed)
[facility], [place] 01/22/21  Subjective:   Hospital day # 10 Urine output 745 cc over the preceding 24 hours.  We are planning for hemodialysis session again tomorrow. Still remains on the ventilator with FiO2 of only 24%.  Renal:  02/10 0701 - 02/11 0700 In: 2444.3 [I.V.:1855.9; NG/GT:85; IV Piggyback:503.4] Out: 2495 [Urine:745; Emesis/NG output:250] Lab Results  Component Value Date   CREATININE 3.39 (H) 01/22/2021   CREATININE 4.06 (H) 01/21/2021   CREATININE 3.59 (H) 01/20/2021      Objective:  Vital signs in last 24 hours:  Temp:  [99.32 F (37.4 C)-99.68 F (37.6 C)] 99.5 F (37.5 C) (02/11 1100) Pulse Rate:  [65-92] 65 (02/11 1100) Resp:  [20-25] 25 (02/11 1100) BP: (119-149)/(69-86) 129/78 (02/11 1100) SpO2:  [100 %] 100 % (02/11 1100) FiO2 (%):  [24 %] 24 % (02/11 1336) Weight:  [194.6 kg] 194.6 kg (02/11 0500)  Weight change:  Filed Weights   01/18/21 0345 01/19/21 0500 01/22/21 0500  Weight: (!) 195 kg (!) 192.8 kg (!) 194.6 kg    Intake/Output:    Intake/Output Summary (Last 24 hours) at 01/22/2021 1444 Last data filed at 01/22/2021 1000 Gross per 24 hour  Intake 2084.3 ml  Output 820 ml  Net 1264.3 ml    Physical Exam: General: Critically ill-appearing  HEENT ETT in place  Lungs: Vent assisted, FiO2 24%  Heart:: Regular  Abdomen: Mild distension  Extremities: Trace edema  Neurologic: sedated  Skin: No acute rash  Access: left IJ temp cath  Foley: Present      Basic Metabolic Panel:  Recent Labs  Lab 01/18/21 1618 01/19/21 0409 01/19/21 1551 01/20/21 0423 01/20/21 0424 01/20/21 1003 01/20/21 2155 01/21/21 0502 01/22/21 0526  NA 135  --  137  --  138  --   --  139 139  K 3.7  --  3.4*  --  2.8*  --  4.1 3.3* 3.9  CL 102  --  102  --  103  --   --  107 107  CO2 24  --  22  --  23  --   --  20* 21*  GLUCOSE 289*  --  200*  --  135*  --   --  141* 128*  BUN 68*  --  57*  --  51*  --   --  62* 51*   CREATININE 3.94*  --  3.42*  --  3.37* 3.59*  --  4.06* 3.39*  CALCIUM 8.5*  --  8.7*  --  8.2*  --   --  8.4* 8.3*  MG  --  1.9  --  2.1  --   --  2.2 2.2 2.2  PHOS 5.9*  --  5.2*  --  5.0*  --   --  6.0* 5.7*     CBC: Recent Labs  Lab 01/17/21 0433 01/18/21 0346 01/19/21 0409 01/20/21 0423 01/20/21 1003 01/21/21 0502 01/22/21 0526  WBC 10.4   < > 18.3* 15.0* 18.0* 17.1* 13.7*  NEUTROABS 7.2  --  10.6* 8.7*  --  9.6* 8.4*  HGB 9.9*   < > 9.3* 8.6* 9.0* 8.9* 8.0*  HCT 30.6*   < > 28.8* 27.4* 28.6* 28.0* 25.8*  MCV 81.4   < > 81.6 82.5 82.7 82.1 83.5  PLT 111*   < > 121* 136* 138* 197 239   < > = values in this interval not displayed.      Lab Results  Component Value Date   HEPBSAG NON REACTIVE 01/20/2021   HEPBSAB NON REACTIVE 01/20/2021      Microbiology:  Recent Results (from the past 240 hour(s))  MRSA PCR Screening     Status: None   Collection Time: 01/12/21  7:22 PM   Specimen: Nasopharyngeal  Result Value Ref Range Status   MRSA by PCR NEGATIVE NEGATIVE Final    Comment:        The GeneXpert MRSA Assay (FDA approved for NASAL specimens only), is one component of a comprehensive MRSA colonization surveillance program. It is not intended to diagnose MRSA infection nor to guide or monitor treatment for MRSA infections. Performed at [facility], [address] [place], [place] [address]   Culture, respiratory (non-expectorated)     Status: None   Collection Time: 01/13/21  3:04 PM   Specimen: Tracheal Aspirate; Respiratory  Result Value Ref Range Status   Specimen Description   Final    TRACHEAL ASPIRATE Performed at [facility], [address], [place], [place] [address]    Special Requests   Final    NONE Performed at [facility], [address] [place], [place] [address]    Gram Stain   Final    ABUNDANT WBC PRESENT, PREDOMINANTLY PMN MODERATE GRAM POSITIVE COCCI Performed at [facility],  [place] [address], [place], [place] [address]    Culture FEW STAPHYLOCOCCUS AUREUS  Final   Report Status 01/16/2021 FINAL  Final   Organism ID, Bacteria STAPHYLOCOCCUS AUREUS  Final      Susceptibility   Staphylococcus aureus - MIC*    CIPROFLOXACIN <=0.5 SENSITIVE Sensitive     ERYTHROMYCIN <=0.25 SENSITIVE Sensitive     GENTAMICIN <=0.5 SENSITIVE Sensitive     OXACILLIN 0.5 SENSITIVE Sensitive     TETRACYCLINE <=1 SENSITIVE Sensitive     VANCOMYCIN 1 SENSITIVE Sensitive     TRIMETH/SULFA <=10 SENSITIVE Sensitive     CLINDAMYCIN <=0.25 SENSITIVE Sensitive     RIFAMPIN <=0.5 SENSITIVE Sensitive     Inducible Clindamycin NEGATIVE Sensitive     * FEW STAPHYLOCOCCUS AUREUS  Culture, respiratory (non-expectorated)     Status: None   Collection Time: 01/19/21 10:41 PM   Specimen: Tracheal Aspirate; Respiratory  Result Value Ref Range Status   Specimen Description   Final    TRACHEAL ASPIRATE Performed at [facility], [address], [place], [place] [address]    Special Requests   Final    NONE Performed at [facility], [address] [place], [place] [address]    Gram Stain   Final    FEW WBC PRESENT,BOTH PMN AND MONONUCLEAR RARE BUDDING YEAST SEEN Performed at [facility], [place] [address] [place], [place] [address]    Culture FEW CANDIDA ALBICANS  Final   Report Status 01/22/2021 FINAL  Final    Coagulation Studies: No results for input(s): LABPROT, INR in the last 72 hours.  Urinalysis: No results for input(s): COLORURINE, LABSPEC, PHURINE, GLUCOSEU, HGBUR, BILIRUBINUR, KETONESUR, PROTEINUR, UROBILINOGEN, NITRITE, LEUKOCYTESUR in the last 72 hours.  Invalid input(s): APPERANCEUR    Imaging: DG Abd 1 View  Result Date: 01/21/2021 CLINICAL DATA:  30 year old male positive COVID-19. Decreased bowel sounds. EXAM: ABDOMEN - 1 VIEW COMPARISON:  01/19/2021 and earlier. FINDINGS: Portable AP supine view at 0626 hours. Stable enteric tube, tip at the  distal stomach. Right femoral approach catheter appears stable. Continued largely absent bowel gas throughout the abdomen and pelvis. Trace gas  in the region of the rectum. No dilated bowel loops are evident. Stable visualized osseous structures. IMPRESSION: 1. Stable enteric tube and right femoral catheter. 2. Continued virtual absence of bowel gas throughout the abdomen and pelvis. No dilated loops are evident, although if there is new abdominal distension then recommend follow-up CT Abdomen and Pelvis as fluid-filled abnormal bowel is difficult to exclude. Electronically Signed   By: Genevie Ann M.D.   On: 01/21/2021 06:49     Medications:   . sodium chloride 5 mL/hr at 01/20/21 0523  . sodium chloride    . dexmedetomidine (PRECEDEX) IV infusion 0.6 mcg/kg/hr (01/22/21 1321)  . dextrose    . erythromycin 250 mg (01/22/21 1321)  . famotidine (PEPCID) IV 100 mL/hr at 01/22/21 1000  . fentaNYL infusion INTRAVENOUS 350 mcg/hr (01/22/21 1120)  . heparin 1,650 Units/hr (01/22/21 1119)  . midazolam 2 mg/hr (01/22/21 1000)  . norepinephrine (LEVOPHED) Adult infusion Stopped (01/20/21 0502)   . aspirin  81 mg Per Tube Daily  . atorvastatin  80 mg Per Tube Daily  . chlorhexidine gluconate (MEDLINE KIT)  15 mL Mouth Rinse BID  . Chlorhexidine Gluconate Cloth  6 each Topical Daily  . docusate  100 mg Per Tube BID  . feeding supplement (VITAL 1.5 CAL)  1,000 mL Per Tube Q24H  . insulin aspart  0-20 Units Subcutaneous Q4H  . insulin aspart  5 Units Subcutaneous Q4H  . insulin detemir  20 Units Subcutaneous QHS  . insulin detemir  40 Units Subcutaneous Daily  . mouth rinse  15 mL Mouth Rinse 10 times per day  . methylPREDNISolone (SOLU-MEDROL) injection  40 mg Intravenous Q24H  . metoCLOPramide  5 mg Per Tube Q8H  . metoprolol tartrate  12.5 mg Per Tube BID  . multivitamin  1 tablet Per Tube QHS  . polyethylene glycol  17 g Per Tube Daily  . sodium chloride flush  3 mL Intravenous Q12H  . sodium  chloride flush  3 mL Intravenous Q12H   sodium chloride, sodium chloride, acetaminophen (TYLENOL) oral liquid 160 mg/5 mL, dextrose, docusate sodium, fentaNYL, midazolam, midazolam, ondansetron **OR** ondansetron (ZOFRAN) IV, ondansetron (ZOFRAN) IV, polyethylene glycol, sodium chloride flush, sodium chloride flush  Assessment/ Plan:  30 y.o. male with h/o Asthma, no recent medical care , was  admitted on 01/12/2021 for ARDS (adult respiratory distress syndrome) ([place]) [J80] Acute respiratory failure with hypoxia ([place]) [J96.01] Acute hypoxemic respiratory failure due to COVID-19 ([facility]) [U07.1, J96.01] Hyperosmolar hyperglycemic state (HHS) ([place]) [E11.00, E11.65] Pneumonia due to COVID-19 virus [U07.1, J12.82]  # AKI Baseline creatinine is unknown U/a- 6-10 RBCs, 11-20 WBCs, UPC of 0.24 US renal - unremarkable, no obstruction 02/10 0701 - 02/11 0700 In: 2444.3 [I.V.:1855.9; NG/GT:85; IV Piggyback:503.4] Out: 2495 [Urine:745; Emesis/NG output:250] -Measured urine output yesterday was 745 cc.  Therefore still dialysis dependent at this time.  We will plan for hemodialysis treatment again tomorrow.  # hypernatremia -Resolved.   # New Dx of Diabetes, type 1 uncontrolled Lab Results  Component Value Date   HGBA1C 13.6 (H) 01/12/2021  Markedly elevated hemoglobin A1c as documented above.  Insulin management as per pulmonary/critical care.  # Acute respiratory failure # Covid 19-infection Patient on minimal vent settings.  FiO2 currently 24%.  Hopefully he still can be weaned from the ventilator.  Otherwise tracheostomy may need to be considered.  #Hypokalemia. Potassium up to 3.9 today post repletion.     LOS: 10 Ife Vitelli 2/11/20222:44 PM  [place],  [place] [phone]  Note: This note was prepared with Dragon dictation. Any transcription errors are unintentional

## 2021-01-22 NOTE — Progress Notes (Signed)
Progress Note  Patient Name: Erik [name] Date of Encounter: 01/22/2021  [facility] Cardiologist: New - Erik [name]  Subjective   Intubate but responsive.  Denies pain.  No events per nursing.  Inpatient Medications    Scheduled Meds: . aspirin  81 mg Per Tube Daily  . atorvastatin  80 mg Per Tube Daily  . chlorhexidine gluconate (MEDLINE KIT)  15 mL Mouth Rinse BID  . Chlorhexidine Gluconate Cloth  6 each Topical Daily  . docusate  100 mg Per Tube BID  . feeding supplement (VITAL 1.5 CAL)  1,000 mL Per Tube Q24H  . insulin aspart  0-20 Units Subcutaneous Q4H  . insulin aspart  5 Units Subcutaneous Q4H  . insulin detemir  20 Units Subcutaneous QHS  . insulin detemir  40 Units Subcutaneous Daily  . mouth rinse  15 mL Mouth Rinse 10 times per day  . methylPREDNISolone (SOLU-MEDROL) injection  40 mg Intravenous Q24H  . metoCLOPramide  5 mg Per Tube Q8H  . multivitamin  1 tablet Per Tube QHS  . polyethylene glycol  17 g Per Tube Daily  . sodium chloride flush  3 mL Intravenous Q12H  . sodium chloride flush  3 mL Intravenous Q12H   Continuous Infusions: . sodium chloride 5 mL/hr at 01/20/21 0523  . sodium chloride    . dexmedetomidine (PRECEDEX) IV infusion 0.6 mcg/kg/hr (01/22/21 0800)  . dextrose    . erythromycin Stopped (01/22/21 0710)  . famotidine (PEPCID) IV Stopped (01/21/21 0843)  . fentaNYL infusion INTRAVENOUS 350 mcg/hr (01/22/21 0800)  . heparin 1,650 Units/hr (01/22/21 0800)  . midazolam 2 mg/hr (01/22/21 0800)  . norepinephrine (LEVOPHED) Adult infusion Stopped (01/20/21 0502)   PRN Meds: sodium chloride, sodium chloride, acetaminophen (TYLENOL) oral liquid 160 mg/5 mL, dextrose, docusate sodium, fentaNYL, midazolam, midazolam, ondansetron **OR** ondansetron (ZOFRAN) IV, ondansetron (ZOFRAN) IV, polyethylene glycol, sodium chloride flush, sodium chloride flush   Vital Signs    Vitals:   01/22/21 0600 01/22/21 0700 01/22/21 0800 01/22/21 0805  BP: 132/75 (!)  145/77 138/72   Pulse: 66 71 66   Resp: (!) 25 (!) 25 (!) 25   Temp: 99.68 F (37.6 C) 99.68 F (37.6 C) 99.5 F (37.5 C)   TempSrc:   Bladder   SpO2: 100% 100% 100% 100%  Weight:      Height:        Intake/Output Summary (Last 24 hours) at 01/22/2021 0927 Last data filed at 01/22/2021 0800 Gross per 24 hour  Intake 2655.55 ml  Output 2420 ml  Net 235.55 ml   Last 3 Weights 01/22/2021 01/19/2021 01/18/2021  Weight (lbs) 429 lb 425 lb 430 lb  Weight (kg) 194.593 kg 192.779 kg 195.047 kg      Telemetry    Normal sinus rhythm and sinus tachycardia. - Personally Reviewed  ECG    No new tracing.  Physical Exam   GEN: No acute distress.  Intubated Neck: Unable to assess JVP due to body habitus. Cardiac: RRR, no murmurs, rubs, or gallops.  Respiratory: Clear anteriorly. GI: Soft, nontender, non-distended  MS: 1+ pretibial edema. Neuro:  Nonfocal  Psych: Unable to assess; intubated.  Labs    High Sensitivity Troponin:   Recent Labs  Lab 01/20/21 0518 01/20/21 1003 01/20/21 1620 01/20/21 2350 01/21/21 0901  TROPONINIHS [place]*      Chemistry Recent Labs  Lab 01/16/21 1638 01/16/21 1621 01/17/21 4665 01/17/21 1536 01/20/21 0424 01/20/21 1003 01/20/21 2155 01/21/21 0502 01/22/21 9935  NA 139   < > 137   < > 138  --   --  139 139  K 4.4   < > 3.8   < > 2.8*  --  4.1 3.3* 3.9  CL 105   < > 102   < > 103  --   --  107 107  CO2 22   < > 23   < > 23  --   --  20* 21*  GLUCOSE 256*   < > 245*   < > 135*  --   --  141* 128*  BUN 74*   < > 64*   < > 51*  --   --  62* 51*  CREATININE 5.83*   < > 4.88*   < > 3.37* 3.59*  --  4.06* 3.39*  CALCIUM 7.8*   < > 8.2*   < > 8.2*  --   --  8.4* 8.3*  PROT 5.3*  --  5.4*  --   --   --   --   --   --   ALBUMIN 1.7*   < > 1.7*   < > 1.9*  --   --  2.0* 2.1*  AST 30  --  26  --   --   --   --   --   --   ALT 20  --  21  --   --   --   --   --   --   ALKPHOS 71  --  74  --   --   --   --   --   --    BILITOT 0.9  --  0.6  --   --   --   --   --   --   GFRNONAA 13*   < > 16*   < > 24* 23*  --  19* 24*  ANIONGAP 12   < > 12   < > 12  --   --  12 11   < > = values in this interval not displayed.     Hematology Recent Labs  Lab 01/20/21 1003 01/21/21 0502 01/22/21 0526  WBC 18.0* 17.1* 13.7*  RBC 3.46* 3.41* 3.09*  HGB 9.0* 8.9* 8.0*  HCT 28.6* 28.0* 25.8*  MCV 82.7 82.1 83.5  MCH 26.0 26.1 25.9*  MCHC 31.5 31.8 31.0  RDW 14.4 14.7 14.7  PLT 138* 197 239    BNP Recent Labs  Lab 01/17/21 1536  BNP 60.4     DDimer No results for input(s): DDIMER in the last 168 hours.   Radiology    DG Abd 1 View  Result Date: 01/21/2021 CLINICAL DATA:  30 year old male positive COVID-19. Decreased bowel sounds. EXAM: ABDOMEN - 1 VIEW COMPARISON:  01/19/2021 and earlier. FINDINGS: Portable AP supine view at 0626 hours. Stable enteric tube, tip at the distal stomach. Right femoral approach catheter appears stable. Continued largely absent bowel gas throughout the abdomen and pelvis. Trace gas in the region of the rectum. No dilated bowel loops are evident. Stable visualized osseous structures. IMPRESSION: 1. Stable enteric tube and right femoral catheter. 2. Continued virtual absence of bowel gas throughout the abdomen and pelvis. No dilated loops are evident, although if there is new abdominal distension then recommend follow-up CT Abdomen and Pelvis as fluid-filled abnormal bowel is difficult to exclude. Electronically Signed   By: Erik [name] M.D.   On: 01/21/2021 06:49   ECHOCARDIOGRAM COMPLETE  Result Date: 01/20/2021    ECHOCARDIOGRAM REPORT   Patient Name:   Erik [name] Date of Exam: 01/20/2021 Medical Rec #:  [id number]    Height:       72.0 in Accession #:    [id number]   Weight:       425.0 lb Date of Birth:  07/23/1991    BSA:          2.935 m Patient Age:    29 years     BP:           99/52 mmHg Patient Gender: M            HR:           80 bpm. Exam Location:  [facility] Procedure: 2D Echo,  Color Doppler and Cardiac Doppler Indications:    I47.2 Ventricular Tachycardia  History:        Patient has no prior history of Echocardiogram examinations.                 Asthma                 Pt tested positive for COVID-19 on 01/12/21.  Sonographer:    Erik [name] Sheer RDCS (AE) Referring Phys: [id number] Carsynn Bethune Diagnosing      Kate Sable MD Phys:  Sonographer Comments: Echo performed with patient supine and on artificial respirator and suboptimal apical window. Image acquisition challenging due to patient body habitus. IMPRESSIONS  1. Left ventricular ejection fraction, by estimation, is 60 to 65%. The left ventricle has normal function. The left ventricle has no regional wall motion abnormalities. Left ventricular diastolic parameters were normal.  2. Right ventricular systolic function is normal. The right ventricular size is normal.  3. The mitral valve is normal in structure. No evidence of mitral valve regurgitation. No evidence of mitral stenosis.  4. The aortic valve is normal in structure. Aortic valve regurgitation is not visualized. No aortic stenosis is present.  5. The inferior vena cava is normal in size with greater than 50% respiratory variability, suggesting right atrial pressure of 3 mmHg. FINDINGS  Left Ventricle: Left ventricular ejection fraction, by estimation, is 60 to 65%. The left ventricle has normal function. The left ventricle has no regional wall motion abnormalities. The left ventricular internal cavity size was normal in size. There is  no left ventricular hypertrophy. Left ventricular diastolic parameters were normal. Right Ventricle: The right ventricular size is normal. No increase in right ventricular wall thickness. Right ventricular systolic function is normal. Left Atrium: Left atrial size was normal in size. Right Atrium: Right atrial size was normal in size. Pericardium: There is no evidence of pericardial effusion. Mitral Valve: The mitral valve is normal in  structure. No evidence of mitral valve regurgitation. No evidence of mitral valve stenosis. MV peak gradient, 4.2 mmHg. The mean mitral valve gradient is 2.0 mmHg. Tricuspid Valve: The tricuspid valve is normal in structure. Tricuspid valve regurgitation is not demonstrated. No evidence of tricuspid stenosis. Aortic Valve: The aortic valve is normal in structure. Aortic valve regurgitation is not visualized. No aortic stenosis is present. Aortic valve mean gradient measures 4.0 mmHg. Aortic valve peak gradient measures 7.7 mmHg. Aortic valve area, by VTI measures 3.59 cm. Pulmonic Valve: The pulmonic valve was normal in structure. Pulmonic valve regurgitation is not visualized. No evidence of pulmonic stenosis. Aorta: The aortic root is normal in size and structure. Venous: The inferior vena cava is normal in size with greater than  50% respiratory variability, suggesting right atrial pressure of 3 mmHg. IAS/Shunts: No atrial level shunt detected by color flow Doppler.  LEFT VENTRICLE PLAX 2D LVIDd:         5.10 cm  Diastology LVIDs:         2.90 cm  LV e' medial:    6.53 cm/s LV PW:         1.00 cm  LV E/e' medial:  14.0 LV IVS:        0.90 cm  LV e' lateral:   6.74 cm/s LVOT diam:     2.30 cm  LV E/e' lateral: 13.6 LV SV:         76 LV SV Index:   26 LVOT Area:     4.15 cm  LEFT ATRIUM           Index LA diam:      4.10 cm 1.40 cm/m LA Vol (A4C): 62.0 ml 21.12 ml/m  AORTIC VALVE                   PULMONIC VALVE AV Area (Vmax):    3.23 cm    PV Vmax:       1.54 m/s AV Area (Vmean):   3.44 cm    PV Vmean:      88.800 cm/s AV Area (VTI):     3.59 cm    PV VTI:        0.259 m AV Vmax:           139.00 cm/s PV Peak grad:  9.5 mmHg AV Vmean:          93.100 cm/s PV Mean grad:  4.0 mmHg AV VTI:            0.213 m AV Peak Grad:      7.7 mmHg AV Mean Grad:      4.0 mmHg LVOT Vmax:         108.00 cm/s LVOT Vmean:        77.100 cm/s LVOT VTI:          0.184 m LVOT/AV VTI ratio: 0.86  AORTA Ao Root diam: 3.10 cm MITRAL  VALVE MV Area (PHT): 3.56 cm    SHUNTS MV Area VTI:   2.67 cm    Systemic VTI:  0.18 m MV Peak grad:  4.2 mmHg    Systemic Diam: 2.30 cm MV Mean grad:  2.0 mmHg MV Vmax:       1.02 m/s MV Vmean:      58.2 cm/s MV Decel Time: 213 msec MV E velocity: 91.70 cm/s MV A velocity: 39.80 cm/s MV E/A ratio:  2.30 Kate Sable MD Electronically signed by Kate Sable MD Signature Date/Time: 01/20/2021/5:48:37 PM    Final     Cardiac Studies   LHC 01/20/2021: Conclusions: 1. Minimal coronary artery plaquing without angiographically significant coronary artery disease. 2. Moderately reduced left ventricular contraction with global hypokinesis. 3. Mildly elevated left ventricular filling pressure (LVEDP ~20 mmHg).  Recommendations: 1. No obvious coronary artery lesion to explain ST segment changes and sustained ventricular tachycardia. Possible causes include metabolic/electrolyte derangements and myocarditis in the setting of COVID-19. 2. Trend HS-TnI until it has peaked, then stop. 3. Obtain transthoracic echocardiogram. 4. If blood pressure tolerates, consider addition of low-dose beta-blocker. 5. If patient has recurrent VT, consider IV amiodarone. 6. Replete electrolytes to maintain K > 4.0 and Mg > 2.0. __________  2D echo 01/20/2021: 1. Left ventricular ejection fraction, by estimation,  is 60 to 65%. The  left ventricle has normal function. The left ventricle has no regional  wall motion abnormalities. Left ventricular diastolic parameters were  normal.  2. Right ventricular systolic function is normal. The right ventricular  size is normal.  3. The mitral valve is normal in structure. No evidence of mitral valve  regurgitation. No evidence of mitral stenosis.  4. The aortic valve is normal in structure. Aortic valve regurgitation is  not visualized. No aortic stenosis is present.  5. The inferior vena cava is normal in size with greater than 50%  respiratory variability,  suggesting right atrial pressure of 3 mmHg.  Patient Profile     30 y.o. male with history of asthma and morbid obesity admitted with COVID PNA complicated by AKI, inferior STEMI, and VT.  Assessment & Plan    Inferior STEMI: No CP.  Cath without significant CAD.  Echo with normal LV and no obvious WMA.  HS-TnI peaked at 6,816.  Question thrombosis with spontaneous lysis, tiny branch occlusion, or myocarditis.  48 hr IV heparin to end this afternoon.  Consider DAPT with aspirin and clopidogrel x 12 months if no bleeding issues; will defer adding clopidogrel as patient being considered for tach in the near future per CCM.  Check lipid panel.  Continue atorvastatin 80 mg daily.  Add metoprolol tartrate 12.5 mg BID.  Could consider cardiac MRI (would need to be done in [place]) if patient has meaningful recovery from his multiorgan failure.  VT: No recurrence.  Start metoprolol trartrate 12.5 mg BID.  Maintain K>4.0, Mg>2.0.  COVID-19:  Per CCM.  For questions or updates, please contact [place] Please consult [email or web address] for contact info under   Signed, Nelva Bush, MD  01/22/2021, 9:27 AM

## 2021-01-22 NOTE — Progress Notes (Signed)
ANTICOAGULATION CONSULT NOTE  Pharmacy Consult for Heparin Infusion Indication: ACS/STEMI  No Known Allergies  Patient Measurements: Height: 6' (182.9 cm) Weight: (!) 192.8 kg (425 lb) IBW/kg (Calculated) : 77.6 Heparin Dosing Weight: 115.8 kg  Vital Signs: Temp: 99.5 F (37.5 C) (02/11 0100) Temp Source: Esophageal (02/10 1600) BP: 140/78 (02/11 0100) Pulse Rate: 70 (02/11 0100)  Labs: Recent Labs    01/20/21 0423 01/20/21 0424 01/20/21 0518 01/20/21 1003 01/20/21 1620 01/20/21 2223 01/20/21 2350 01/21/21 0502 01/21/21 0504 01/21/21 0901 01/21/21 1514 01/22/21 0034  HGB 8.6*  --   --  9.0*  --   --   --  8.9*  --   --   --   --   HCT 27.4*  --   --  28.6*  --   --   --  28.0*  --   --   --   --   PLT 136*  --   --  138*  --   --   --  197  --   --   --   --   HEPARINUNFRC  --   --   --   --   --    < >  --   --  0.43  --  1.13* 0.25*  CREATININE  --  3.37*  --  3.59*  --   --   --  4.06*  --   --   --   --   TROPONINIHS  --   --    < > 4,189* 6,440*  --  6,195*  --   --  4,519*  --   --    < > = values in this interval not displayed.    Estimated Creatinine Clearance: 47 mL/min (A) (by C-G formula based on SCr of 4.06 mg/dL (H)).   Medical History: Past Medical History:  Diagnosis Date  . Asthma     Assessment: Pt is 30 yo male admitted on 2/1 for Acute hypoxemic respiratory failure due to COVID. Patient taken to cath lab for STEMI. Consult for heparin drip but it does not appear heparin drip was ever started. Consult to continue heparin for 48 hours.  02/09 2223 HL 0.21, subtherapeutic 02/10 0504 HL 0.43, therapeutic 02/10 1514 HL 1.13: supratherapeutic 02/10 0034 HL 0.25, subtherapeutic   Goal of Therapy:  Heparin level 0.3-0.7 units/ml Monitor platelets by anticoagulation protocol: Yes   Plan:   Give 1700 unit bolus x 1  Hold heparin drip for one hour then decrease rate to 1650 units/hr  Re-check anti-Xa level 6 hours after rate change    CBC daily while on heparin drip  Otelia Sergeant, PharmD, [facility] 01/22/2021 1:55 AM

## 2021-01-23 LAB — FIBRIN DERIVATIVES D-DIMER (ARMC ONLY): Fibrin derivatives D-dimer (ARMC): 1710.57 ng/mL (FEU) — ABNORMAL HIGH (ref 0.00–499.00)

## 2021-01-23 LAB — CBC WITH DIFFERENTIAL/PLATELET
Abs Immature Granulocytes: 0.81 10*3/uL — ABNORMAL HIGH (ref 0.00–0.07)
Basophils Absolute: 0 10*3/uL (ref 0.0–0.1)
Basophils Relative: 0 %
Eosinophils Absolute: 0 10*3/uL (ref 0.0–0.5)
Eosinophils Relative: 0 %
HCT: 27 % — ABNORMAL LOW (ref 39.0–52.0)
Hemoglobin: 8.3 g/dL — ABNORMAL LOW (ref 13.0–17.0)
Immature Granulocytes: 7 %
Lymphocytes Relative: 10 %
Lymphs Abs: 1.1 10*3/uL (ref 0.7–4.0)
MCH: 25.6 pg — ABNORMAL LOW (ref 26.0–34.0)
MCHC: 30.7 g/dL (ref 30.0–36.0)
MCV: 83.3 fL (ref 80.0–100.0)
Monocytes Absolute: 1.4 10*3/uL — ABNORMAL HIGH (ref 0.1–1.0)
Monocytes Relative: 13 %
Neutro Abs: 7.6 10*3/uL (ref 1.7–7.7)
Neutrophils Relative %: 70 %
Platelets: 292 10*3/uL (ref 150–400)
RBC: 3.24 MIL/uL — ABNORMAL LOW (ref 4.22–5.81)
RDW: 14.7 % (ref 11.5–15.5)
Smear Review: NORMAL
WBC: 10.9 10*3/uL — ABNORMAL HIGH (ref 4.0–10.5)
nRBC: 0.2 % (ref 0.0–0.2)

## 2021-01-23 LAB — GLUCOSE, CAPILLARY
Glucose-Capillary: 206 mg/dL — ABNORMAL HIGH (ref 70–99)
Glucose-Capillary: 215 mg/dL — ABNORMAL HIGH (ref 70–99)
Glucose-Capillary: 179 mg/dL — ABNORMAL HIGH (ref 70–99)
Glucose-Capillary: 209 mg/dL — ABNORMAL HIGH (ref 70–99)
Glucose-Capillary: 222 mg/dL — ABNORMAL HIGH (ref 70–99)
Glucose-Capillary: 198 mg/dL — ABNORMAL HIGH (ref 70–99)

## 2021-01-23 LAB — FERRITIN: Ferritin: 642 ng/mL — ABNORMAL HIGH (ref 24–336)

## 2021-01-23 LAB — RENAL FUNCTION PANEL
Albumin: 1.9 g/dL — ABNORMAL LOW (ref 3.5–5.0)
Anion gap: 12 (ref 5–15)
BUN: 62 mg/dL — ABNORMAL HIGH (ref 6–20)
CO2: 21 mmol/L — ABNORMAL LOW (ref 22–32)
Calcium: 8.8 mg/dL — ABNORMAL LOW (ref 8.9–10.3)
Chloride: 109 mmol/L (ref 98–111)
Creatinine, Ser: 3.44 mg/dL — ABNORMAL HIGH (ref 0.61–1.24)
GFR, Estimated: 24 mL/min — ABNORMAL LOW (ref >60)
Glucose, Bld: 216 mg/dL — ABNORMAL HIGH (ref 70–99)
Phosphorus: 7 mg/dL — ABNORMAL HIGH (ref 2.5–4.6)
Potassium: 5 mmol/L (ref 3.5–5.1)
Sodium: 142 mmol/L (ref 135–145)

## 2021-01-23 LAB — MAGNESIUM: Magnesium: 2.4 mg/dL (ref 1.7–2.4)

## 2021-01-23 LAB — C-REACTIVE PROTEIN: CRP: 8.9 mg/dL — ABNORMAL HIGH (ref <1.0)

## 2021-01-23 MED ORDER — METOPROLOL TARTRATE 5 MG/5ML IV SOLN
INTRAVENOUS | Status: AC
  Administered 2021-01-23: 5 mg via INTRAVENOUS
  Filled 2021-01-23: qty 5

## 2021-01-23 MED ORDER — HYDRALAZINE HCL 20 MG/ML IJ SOLN: 10.0000 mg | INTRAMUSCULAR | Status: DC | PRN

## 2021-01-23 MED ORDER — ORAL CARE MOUTH RINSE
15.0000 mL | Freq: Two times a day (BID) | OROMUCOSAL | Status: DC
  Administered 2021-01-23 – 2021-02-03 (×20): 15 mL via OROMUCOSAL

## 2021-01-23 MED ORDER — MORPHINE SULFATE (PF) 2 MG/ML IV SOLN
INTRAVENOUS | Status: AC
  Administered 2021-01-23: 2 mg via INTRAVENOUS
  Filled 2021-01-23: qty 1

## 2021-01-23 MED ORDER — METOPROLOL TARTRATE 5 MG/5ML IV SOLN
5.0000 mg | Freq: Four times a day (QID) | INTRAVENOUS | Status: DC | PRN
  Administered 2021-01-24 (×2): 5 mg via INTRAVENOUS
  Filled 2021-01-23: qty 5

## 2021-01-23 MED ORDER — MORPHINE SULFATE (PF) 2 MG/ML IV SOLN: 1.0000 mg | INTRAVENOUS | Status: DC | PRN

## 2021-01-23 MED ORDER — MORPHINE SULFATE (PF) 2 MG/ML IV SOLN: 1.0000 mg | Freq: Four times a day (QID) | INTRAVENOUS | Status: DC | PRN

## 2021-01-23 NOTE — Progress Notes (Addendum)
Progress Note  Patient Name: Erik [name] Date of Encounter: 01/23/2021  [facility] Cardiologist: New - End  Subjective   He remains intubated.  Has had some bleeding from HD catheter site. Planning for HD today. Magnesium and potassium at goal. No further VT.   Inpatient Medications    Scheduled Meds: . aspirin  81 mg Per Tube Daily  . atorvastatin  80 mg Per Tube Daily  . chlorhexidine gluconate (MEDLINE KIT)  15 mL Mouth Rinse BID  . Chlorhexidine Gluconate Cloth  6 each Topical Daily  . docusate  100 mg Per Tube BID  . feeding supplement (VITAL 1.5 CAL)  1,000 mL Per Tube Q24H  . heparin injection (subcutaneous)  5,000 Units Subcutaneous Q8H  . insulin aspart  0-20 Units Subcutaneous Q4H  . insulin aspart  5 Units Subcutaneous Q4H  . insulin detemir  24 Units Subcutaneous QHS  . insulin detemir  44 Units Subcutaneous Daily  . mouth rinse  15 mL Mouth Rinse 10 times per day  . methylPREDNISolone (SOLU-MEDROL) injection  40 mg Intravenous Q24H  . metoCLOPramide  5 mg Per Tube Q8H  . metoprolol tartrate  12.5 mg Per Tube BID  . multivitamin  1 tablet Per Tube QHS  . polyethylene glycol  17 g Per Tube Daily  . sodium chloride flush  3 mL Intravenous Q12H   Continuous Infusions: . sodium chloride    . dexmedetomidine (PRECEDEX) IV infusion 0.6 mcg/kg/hr (01/23/21 0656)  . dextrose    . erythromycin 250 mg (01/23/21 0024)  . famotidine (PEPCID) IV Stopped (01/22/21 1016)  . fentaNYL infusion INTRAVENOUS 350 mcg/hr (01/23/21 0208)  . midazolam 2 mg/hr (01/23/21 0651)  . norepinephrine (LEVOPHED) Adult infusion Stopped (01/20/21 0502)   PRN Meds: sodium chloride, acetaminophen (TYLENOL) oral liquid 160 mg/5 mL, dextrose, docusate sodium, fentaNYL, midazolam, midazolam, ondansetron **OR** ondansetron (ZOFRAN) IV, ondansetron (ZOFRAN) IV, polyethylene glycol, sodium chloride flush   Vital Signs    Vitals:   01/23/21 0232 01/23/21 0300 01/23/21 0400 01/23/21 0500   BP:  (!) 151/87 (!) 151/92 (!) 152/90  Pulse:  63 63 62  Resp:  (!) 25 (!) 25 (!) 25  Temp:  98.6 F (37 C) 98.6 F (37 C) 98.42 F (36.9 C)  TempSrc:      SpO2: 100% 100% 100% 100%  Weight:    (!) 195.5 kg  Height:        Intake/Output Summary (Last 24 hours) at 01/23/2021 0718 Last data filed at 01/23/2021 0526 Gross per 24 hour  Intake 1390.07 ml  Output 1575 ml  Net -184.93 ml   Last 3 Weights 01/23/2021 01/22/2021 01/19/2021  Weight (lbs) 431 lb 429 lb 425 lb  Weight (kg) 195.5 kg 194.593 kg 192.779 kg      Telemetry    Normal sinus rhythm with brief episodes of sinus tachycardia. - Personally Reviewed  ECG    No new tracing.  Physical Exam   Given Covid status, please see MD attestation.   Labs    High Sensitivity Troponin:   Recent Labs  Lab 01/20/21 0518 01/20/21 1003 01/20/21 1620 01/20/21 2350 01/21/21 0901  TROPONINIHS 47* 4,189* 6,440* 6,816* 4,519*      Chemistry Recent Labs  Lab 01/17/21 0433 01/17/21 1536 01/21/21 0502 01/22/21 0526 01/23/21 0517  NA 137   < > 139 139 142  K 3.8   < > 3.3* 3.9 5.0  CL 102   < > 107 107 109  CO2 23   < >  20* 21* 21*  GLUCOSE 245*   < > 141* 128* 216*  BUN 64*   < > 62* 51* 62*  CREATININE 4.88*   < > 4.06* 3.39* 3.44*  CALCIUM 8.2*   < > 8.4* 8.3* 8.8*  PROT 5.4*  --   --   --   --   ALBUMIN 1.7*   < > 2.0* 2.1* 1.9*  AST 26  --   --   --   --   ALT 21  --   --   --   --   ALKPHOS 74  --   --   --   --   BILITOT 0.6  --   --   --   --   GFRNONAA 16*   < > 19* 24* 24*  ANIONGAP 12   < > '12 11 12   ' < > = values in this interval not displayed.     Hematology Recent Labs  Lab 01/21/21 0502 01/22/21 0526 01/23/21 0517  WBC 17.1* 13.7* 10.9*  RBC 3.41* 3.09* 3.24*  HGB 8.9* 8.0* 8.3*  HCT 28.0* 25.8* 27.0*  MCV 82.1 83.5 83.3  MCH 26.1 25.9* 25.6*  MCHC 31.8 31.0 30.7  RDW 14.7 14.7 14.7  PLT 197 239 292    BNP Recent Labs  Lab 01/17/21 1536  BNP 60.4     DDimer No results for  input(s): DDIMER in the last 168 hours.   Radiology    No results found.  Cardiac Studies   LHC 01/20/2021: Conclusions: 1. Minimal coronary artery plaquing without angiographically significant coronary artery disease. 2. Moderately reduced left ventricular contraction with global hypokinesis. 3. Mildly elevated left ventricular filling pressure (LVEDP ~20 mmHg).  Recommendations: 1. No obvious coronary artery lesion to explain ST segment changes and sustained ventricular tachycardia. Possible causes include metabolic/electrolyte derangements and myocarditis in the setting of COVID-19. 2. Trend HS-TnI until it has peaked, then stop. 3. Obtain transthoracic echocardiogram. 4. If blood pressure tolerates, consider addition of low-dose beta-blocker. 5. If patient has recurrent VT, consider IV amiodarone. 6. Replete electrolytes to maintain K > 4.0 and Mg > 2.0. __________  2D echo 01/20/2021: 1. Left ventricular ejection fraction, by estimation, is 60 to 65%. The  left ventricle has normal function. The left ventricle has no regional  wall motion abnormalities. Left ventricular diastolic parameters were  normal.  2. Right ventricular systolic function is normal. The right ventricular  size is normal.  3. The mitral valve is normal in structure. No evidence of mitral valve  regurgitation. No evidence of mitral stenosis.  4. The aortic valve is normal in structure. Aortic valve regurgitation is  not visualized. No aortic stenosis is present.  5. The inferior vena cava is normal in size with greater than 50%  respiratory variability, suggesting right atrial pressure of 3 mmHg.  Patient Profile     30 y.o. male with history of asthma and morbid obesity admitted with COVID PNA complicated by AKI, inferior STEMI, and VT.  Assessment & Plan    Inferior STEMI: Cath without significant CAD.  Echo with normal LV and no obvious WMA.  HS-TnI peaked at 6,816.  Question thrombosis with  spontaneous lysis, tiny branch occlusion, or myocarditis.  He has completed 48 hour IV heparin infusion.  Consider DAPT with aspirin and clopidogrel x 12 months if no bleeding issues; will defer adding clopidogrel as patient is being considered for trach in the near future per CCM.  LDL 90.  Continue atorvastatin 80 mg daily. Follow up as outpatient.   Metoprolol tartrate 12.5 mg BID added 2/11, though has not received any to date. BP and HR do not preclude usage at this time.   Could consider cardiac MRI (would need to be done in [place]) if patient has meaningful recovery from his multiorgan failure.  VT: No recurrence.  Start metoprolol trartrate 12.5 mg BID as above.  Maintain K>4.0, Mg>2.0.  COVID-19:  Per CCM.  For questions or updates, please contact [place] Please consult [email or web address] for contact info under   Signed, Christell Faith, PA-C  01/23/2021, 7:18 AM    Patient examined chart reviewed Rhythm stable no further VT EF normal by TTE No significant CAD or anomaly  At cath Possible COVID 19 myocarditis but no RWMAls on echo Cannot r/o spasm or resolved embolic phenomenon To coronaries Continue beta blocker steroids and heparin   Jenkins Rouge MD [facility]

## 2021-01-23 NOTE — Progress Notes (Signed)
[facility] hospitalist acceptance note  30 year old morbidly obese male with history of asthma presented to the emergency room on 01/13/2020 with severe acute hypoxic respiratory failure/ARDS and severe metabolic acidosis associated with acute renal failure in the setting of COVID-19 pneumonia.  Patient was emergently intubated on 01/13/2020.  Hospital course complicated by renal failure requiring CRRT.  Also had acute MI on 01/19/2021.  Failed weaning trials up until 01/22/2020.  Was successfully extubated 01/23/21.  Cardiology nephrology following. [facility] hospitalist service to assume primary care of this patient on 01/24/2021  Signout received via secure chat from ICU Dr. Unknown Foley MD  No charge

## 2021-01-23 NOTE — Progress Notes (Signed)
Leone Haven RN assisted tele visit to patient with mother.  Taren Dymek M, RN

## 2021-01-23 NOTE — Progress Notes (Signed)
[facility]  ROUNDING NOTE   Subjective:   UOP 1575mL.  Plan on extubation yesterday.   Objective:  Vital signs in last 24 hours:  Temp:  [97.88 F (36.6 C)-100.22 F (37.9 C)] 97.88 F (36.6 C) (02/12 1100) Pulse Rate:  [58-82] 74 (02/12 1100) Resp:  [21-25] 21 (02/12 1100) BP: (139-154)/(79-94) 148/88 (02/12 1100) SpO2:  [95 %-100 %] 100 % (02/12 1100) FiO2 (%):  [24 %] 24 % (02/12 1002) Weight:  [195.5 kg] 195.5 kg (02/12 0500)  Weight change: 0.907 kg Filed Weights   01/19/21 0500 01/22/21 0500 01/23/21 0500  Weight: (!) 192.8 kg (!) 194.6 kg (!) 195.5 kg    Intake/Output: I/O last 3 completed shifts: In: 2424.1 [I.V.:1836.4; ZOXWR:604Other:125; IV Piggyback:462.7] Out: 2195 [Urine:2045; Emesis/NG output:150]   Intake/Output this shift:  Total I/O In: 1438.6 [I.V.:1047.3; IV Piggyback:391.3] Out: -   Physical Exam: General: Critically ill  Head: ETT   Eyes: Anicteric, PERRL  Neck:  trachea midline  Lungs:  PRVC fiO2 24%  Heart: Regular rate and rhythm  Abdomen:  Soft, obese  Extremities:  no peripheral edema.  Neurologic: Intubated and sedated  Skin: No lesions  Access: LIJ permcath    Basic Metabolic Panel: Recent Labs  Lab 01/19/21 1551 01/20/21 0423 01/20/21 0424 01/20/21 1003 01/20/21 2155 01/21/21 0502 01/22/21 0526 01/23/21 0517  NA 137  --  138  --   --  139 139 142  K 3.4*  --  2.8*  --  4.1 3.3* 3.9 5.0  CL 102  --  103  --   --  107 107 109  CO2 22  --  23  --   --  20* 21* 21*  GLUCOSE 200*  --  135*  --   --  141* 128* 216*  BUN 57*  --  51*  --   --  62* 51* 62*  CREATININE 3.42*  --  3.37* 3.59*  --  4.06* 3.39* 3.44*  CALCIUM 8.7*  --  8.2*  --   --  8.4* 8.3* 8.8*  MG  --  2.1  --   --  2.2 2.2 2.2 2.4  PHOS 5.2*  --  5.0*  --   --  6.0* 5.7* 7.0*    Liver Function Tests: Recent Labs  Lab 01/17/21 0433 01/17/21 1536 01/19/21 1551 01/20/21 0424 01/21/21 0502 01/22/21 0526 01/23/21 0517  AST 26  --   --   --    --   --   --   ALT 21  --   --   --   --   --   --   ALKPHOS 74  --   --   --   --   --   --   BILITOT 0.6  --   --   --   --   --   --   PROT 5.4*  --   --   --   --   --   --   ALBUMIN 1.7*   < > 2.1* 1.9* 2.0* 2.1* 1.9*   < > = values in this interval not displayed.   No results for input(s): LIPASE, AMYLASE in the last 168 hours. No results for input(s): AMMONIA in the last 168 hours.  CBC: Recent Labs  Lab 01/19/21 0409 01/20/21 0423 01/20/21 1003 01/21/21 0502 01/22/21 0526 01/23/21 0517  WBC 18.3* 15.0* 18.0* 17.1* 13.7* 10.9*  NEUTROABS 10.6* 8.7*  --  9.6* 8.4* 7.6  HGB 9.3* 8.6* 9.0* 8.9* 8.0* 8.3*  HCT 28.8* 27.4* 28.6* 28.0* 25.8* 27.0*  MCV 81.6 82.5 82.7 82.1 83.5 83.3  PLT 121* 136* 138* 197 239 292    Cardiac Enzymes: No results for input(s): CKTOTAL, CKMB, CKMBINDEX, TROPONINI in the last 168 hours.  BNP: Invalid input(s): POCBNP  CBG: Recent Labs  Lab 01/22/21 1951 01/22/21 2349 01/23/21 0337 01/23/21 0735 01/23/21 1047  GLUCAP 249* 216* 206* 215* 179*    Microbiology: Results for orders placed or performed during the hospital encounter of 01/12/21  Blood culture (routine single)     Status: None   Collection Time: 01/12/21  9:05 AM   Specimen: BLOOD  Result Value Ref Range Status   Specimen Description BLOOD LEFT AC  Final   Special Requests   Final    BOTTLES DRAWN AEROBIC ONLY Blood Culture results may not be optimal due to an inadequate volume of blood received in culture bottles   Culture   Final    NO GROWTH 5 DAYS Performed at [facility], [address] [place], [place] [address]    Report Status 01/17/2021 FINAL  Final  SARS Coronavirus 2 by RT PCR (hospital order, performed in [facility] hospital lab) Nasopharyngeal Nasopharyngeal Swab     Status: Abnormal   Collection Time: 01/12/21  9:36 AM   Specimen: Nasopharyngeal Swab  Result Value Ref Range Status   SARS Coronavirus 2 POSITIVE (A) NEGATIVE Final     Comment: RESULT CALLED TO, READ BACK BY AND VERIFIED WITH: ANNA JASPER ON 01/12/21 AT 1111 SDR (NOTE) SARS-CoV-2 target nucleic acids are DETECTED  SARS-CoV-2 RNA is generally detectable in upper respiratory specimens  during the acute phase of infection.  Positive results are indicative  of the presence of the identified virus, but do not rule out bacterial infection or co-infection with other pathogens not detected by the test.  Clinical correlation with patient history and  other diagnostic information is necessary to determine patient infection status.  The expected result is negative.  Fact Sheet for Patients:   BoilerBrush.com.cy   Fact Sheet for Healthcare Providers:   https://pope.com/    This test is not yet approved or cleared by the [place] FDA and  has been authorized for detection and/or diagnosis of SARS-CoV-2 by FDA under an Emergency Use Authorization (EUA).  This EUA will remain in effect (meaning this tes t can be used) for the duration of  the COVID-19 declaration under Section 564(b)(1) of the Act, 21 U.S.C. section 360-bbb-3(b)(1), unless the authorization is terminated or revoked sooner.  Performed at [facility], [address] [place], [place] [address]   Culture, blood (single)     Status: None   Collection Time: 01/12/21  1:08 PM   Specimen: BLOOD  Result Value Ref Range Status   Specimen Description BLOOD BLOOD RIGHT HAND  Final   Special Requests   Final    BOTTLES DRAWN AEROBIC AND ANAEROBIC Blood Culture adequate volume   Culture   Final    NO GROWTH 5 DAYS Performed at [facility], [address] [place], [place] [address]    Report Status 01/17/2021 FINAL  Final  MRSA PCR Screening     Status: None   Collection Time: 01/12/21  7:22 PM   Specimen: Nasopharyngeal  Result Value Ref Range Status   MRSA by PCR NEGATIVE NEGATIVE Final    Comment:        The GeneXpert  MRSA Assay (FDA approved  for NASAL specimens only), is one component of a comprehensive MRSA colonization surveillance program. It is not intended to diagnose MRSA infection nor to guide or monitor treatment for MRSA infections. Performed at [facility], [address] [place], [place] [address]   Culture, respiratory (non-expectorated)     Status: None   Collection Time: 01/13/21  3:04 PM   Specimen: Tracheal Aspirate; Respiratory  Result Value Ref Range Status   Specimen Description   Final    TRACHEAL ASPIRATE Performed at [facility], [address] [place], [place] [address]    Special Requests   Final    NONE Performed at [facility], [address] [place], [place] [address]    Gram Stain   Final    ABUNDANT WBC PRESENT, PREDOMINANTLY PMN MODERATE GRAM POSITIVE COCCI Performed at [facility], [address], [place], [place] [address]    Culture FEW STAPHYLOCOCCUS AUREUS  Final   Report Status 01/16/2021 FINAL  Final   Organism ID, Bacteria STAPHYLOCOCCUS AUREUS  Final      Susceptibility   Staphylococcus aureus - MIC*    CIPROFLOXACIN <=0.5 SENSITIVE Sensitive     ERYTHROMYCIN <=0.25 SENSITIVE Sensitive     GENTAMICIN <=0.5 SENSITIVE Sensitive     OXACILLIN 0.5 SENSITIVE Sensitive     TETRACYCLINE <=1 SENSITIVE Sensitive     VANCOMYCIN 1 SENSITIVE Sensitive     TRIMETH/SULFA <=10 SENSITIVE Sensitive     CLINDAMYCIN <=0.25 SENSITIVE Sensitive     RIFAMPIN <=0.5 SENSITIVE Sensitive     Inducible Clindamycin NEGATIVE Sensitive     * FEW STAPHYLOCOCCUS AUREUS  Culture, respiratory (non-expectorated)     Status: None   Collection Time: 01/19/21 10:41 PM   Specimen: Tracheal Aspirate; Respiratory  Result Value Ref Range Status   Specimen Description   Final    TRACHEAL ASPIRATE Performed at [facility], [address], [place], [place] [address]    Special Requests   Final    NONE Performed at [facility], [address] [place], [place] [address]    Gram Stain   Final    FEW WBC PRESENT,BOTH PMN AND MONONUCLEAR RARE BUDDING YEAST SEEN Performed at [facility], [address], [place], [place] [address]    Culture FEW CANDIDA ALBICANS  Final   Report Status 01/22/2021 FINAL  Final    Coagulation Studies: No results for input(s): LABPROT, INR in the last 72 hours.  Urinalysis: No results for input(s): COLORURINE, LABSPEC, PHURINE, GLUCOSEU, HGBUR, BILIRUBINUR, KETONESUR, PROTEINUR, UROBILINOGEN, NITRITE, LEUKOCYTESUR in the last 72 hours.  Invalid input(s): APPERANCEUR    Imaging: No results found.   Medications:   . sodium chloride    . dexmedetomidine (PRECEDEX) IV infusion 0.6 mcg/kg/hr (01/23/21 1026)  . dextrose    . erythromycin Stopped (01/23/21 0934)  . famotidine (PEPCID) IV 100 mL/hr at 01/23/21 1000  . fentaNYL infusion INTRAVENOUS Stopped (01/23/21 0955)  . midazolam Stopped (01/23/21 0955)  . norepinephrine (LEVOPHED) Adult infusion Stopped (01/20/21 0502)   . aspirin  81 mg Per Tube Daily  . atorvastatin  80 mg Per Tube Daily  . Chlorhexidine Gluconate Cloth  6 each Topical Daily  . docusate  100 mg Per Tube BID  . feeding supplement (VITAL 1.5 CAL)  1,000 mL Per Tube Q24H  . heparin injection (subcutaneous)  5,000 Units Subcutaneous Q8H  . insulin aspart  0-20 Units Subcutaneous Q4H  . insulin aspart  5 Units Subcutaneous  Q4H  . insulin detemir  24 Units Subcutaneous QHS  . insulin detemir  44 Units Subcutaneous Daily  . mouth rinse  15 mL Mouth Rinse BID  . methylPREDNISolone (SOLU-MEDROL) injection  40 mg Intravenous Q24H  . metoCLOPramide  5 mg Per Tube Q8H  . metoprolol tartrate  12.5 mg Per Tube BID  . multivitamin  1 tablet Per Tube QHS  . polyethylene glycol  17 g Per Tube Daily  . sodium chloride flush  3 mL Intravenous Q12H   sodium chloride, acetaminophen (TYLENOL) oral liquid 160 mg/5 mL, dextrose, docusate sodium,  fentaNYL, midazolam, midazolam, ondansetron **OR** ondansetron (ZOFRAN) IV, ondansetron (ZOFRAN) IV, polyethylene glycol, sodium chloride flush  Assessment/ Plan:  [name] is a 30 y.o. black male with asthma, and no outpatient medications who was admitted to [facility] on 01/12/2021 for ARDS (adult respiratory distress syndrome) (HCC) [J80] Acute respiratory failure with hypoxia (HCC) [J96.01] Acute hypoxemic respiratory failure due to COVID-19 (HCC) [U07.1, J96.01] Hyperosmolar hyperglycemic state (HHS) (HCC) [E11.00, E11.65] Pneumonia due to COVID-19 virus [U07.1, J12.82]   Started on CRRT on admission. Discontinued on 2/9. Hemodynamically stable.   1. Acute kidney injury: with no known creatinine baseline. Urinalysis on admission with glycosuria, proteinuria, hemoglobinuria and ketones.  Nonoliguric urine output.  No indication for dialysis today. Continue to monitor for dialysis need.   2. Acute respiratory failure: requiring intubation and mechanical ventilation. Secondary to COVID-19 infection.  Plan on extubation today Appreciate critical care input.   3. 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 Hemoglobin A1c of 13.6%.   4. Anemia with kidney failure: hemoglobin 8.3. normocytic.    LOS: 11 Mykenna Viele 2/12/202211:54 AM

## 2021-01-23 NOTE — Progress Notes (Signed)
PHARMACY CONSULT NOTE   Pharmacy Consult for Electrolyte Monitoring and Replacement   Recent Labs: Potassium (mmol/L)  Date Value  01/23/2021 5.0   Magnesium (mg/dL)  Date Value  67/89/3810 2.4   Calcium (mg/dL)  Date Value  17/51/0258 8.8 (L)   Albumin (g/dL)  Date Value  52/77/8242 1.9 (L)   Phosphorus (mg/dL)  Date Value  35/36/1443 7.0 (H)   Sodium (mmol/L)  Date Value  01/23/2021 142    Assessment: 30 year old male admitted with COVID-19 pneumonia and DKA. Started on an insulin drip. Patient with hypoxic respiratory failure requiring intubation 2/1. Patient hypotensive and requiring blood pressure support with Levophed. Patient remains intubated sedated and on mechanical ventilation in the ICU. Pharmacy to manage electrolytes.  Nephrology following. Previously on CRRT which was stopped 2/9. Following daily for HD needs.  Goal of Therapy:  Electrolytes WNL Per Cards (for VT): Maintain K>4.0, Mg>2.0.   Plan:  K 5.0  Mag 2.4  Phos 7.0  HD determined daily Defer electrolyte replacement today. Planning HD today 2/12 per cards note.  Tube feeds have been on hold for bowel rest. Continue to follow closely.  Orabelle Rylee A, PharmD 01/23/2021 7:53 AM

## 2021-01-23 NOTE — Progress Notes (Signed)
CRITICAL CARE NOTE  30 yo morbidly obese male with ASTHMA now with progressive multiorgan failure with COVID 19 pneumonia and ARDS with severe acidosis and severe hypoxia with ARDS with acute renal failure  2/1 Emergently intubated 2/2 severe resp failure, vasc cath placed 2/3 severe resp failure 01/15/2021-patient is critically Ill, CRRT continued. Borderline low blood glucose today.  01/16/2021-patient with thickened heavy ETT secretions. RN reporting inadequate sedation with patient making motions to remove airway, we have advanced RASS to range -3 to -4. Remains critically ill with ongoing renal replacement therapy and COVID treatment.  01/17/2021-patient had thickened secretions again today, he was lavaged with saline by RT today with good response. We will try awakening trial again in AM.  01/18/2021-remains on vent, on CRRT 01/19/2021-acute MI 2/9 s/p cath, multiorgan failure+vomiting 2/10 FAILED WEANING TRIALS  CC  follow up respiratory failure  SUBJECTIVE Patient remains critically ill Prognosis is guarded  Plan for HD and assess for trial of extubation today   Vent Mode: PRVC FiO2 (%):  [24 %] 24 % Set Rate:  [25 bmp] 25 bmp Vt Set:  [550 mL] 550 mL PEEP:  [5 cmH20] 5 cmH20 Plateau Pressure:  [22 cmH20-24 cmH20] 22 cmH20  CBC    Component Value Date/Time   WBC 10.9 (H) 01/23/2021 0517   RBC 3.24 (L) 01/23/2021 0517   HGB 8.3 (L) 01/23/2021 0517   HCT 27.0 (L) 01/23/2021 0517   PLT 292 01/23/2021 0517   MCV 83.3 01/23/2021 0517   MCH 25.6 (L) 01/23/2021 0517   MCHC 30.7 01/23/2021 0517   RDW 14.7 01/23/2021 0517   LYMPHSABS 1.1 01/23/2021 0517   MONOABS 1.4 (H) 01/23/2021 0517   EOSABS 0.0 01/23/2021 0517   BASOSABS 0.0 01/23/2021 0517   BMP Latest Ref Rng & Units 01/23/2021 01/22/2021 01/21/2021  Glucose 70 - 99 mg/dL 216(H) 128(H) 141(H)  BUN 6 - 20 mg/dL 62(H) 51(H) 62(H)  Creatinine 0.61 - 1.24 mg/dL 3.44(H) 3.39(H) 4.06(H)  Sodium 135 - 145  mmol/L 142 139 139  Potassium 3.5 - 5.1 mmol/L 5.0 3.9 3.3(L)  Chloride 98 - 111 mmol/L 109 107 107  CO2 22 - 32 mmol/L 21(L) 21(L) 20(L)  Calcium 8.9 - 10.3 mg/dL 8.8(L) 8.3(L) 8.4(L)    BP (!) 152/90   Pulse 62   Temp 98.42 F (36.9 C)   Resp (!) 25   Ht 6' (1.829 m)   Wt (!) 195.5 kg   SpO2 100%   BMI 58.45 kg/m    I/O last 3 completed shifts: In: 2424.1 [I.V.:1836.4; Other:125; IV Piggyback:462.7] Out: 2195 [Urine:2045; Emesis/NG output:150] No intake/output data recorded.  SpO2: 100 % O2 Flow Rate (L/min): 7 L/min FiO2 (%): 24 %  Estimated body mass index is 58.45 kg/m as calculated from the following:   Height as of this encounter: 6' (1.829 m).   Weight as of this encounter: 195.5 kg.  SIGNIFICANT EVENTS   REVIEW OF SYSTEMS  PATIENT IS UNABLE TO PROVIDE COMPLETE REVIEW OF SYSTEMS DUE TO SEVERE CRITICAL ILLNESS       PHYSICAL EXAMINATION: GENERAL:critically ill appearing, +resp distress NECK: Supple.  PULMONARY: +rhonchi, +wheezing CARDIOVASCULAR: S1 and S2.  GASTROINTESTINAL: Soft, nontender, +Positive bowel sounds.  MUSCULOSKELETAL: No swelling, clubbing, or edema.  NEUROLOGIC: obtunded, GCS<8 SKIN:intact,warm,dry     MEDICATIONS: I have reviewed all medications and confirmed regimen as documented   CULTURE RESULTS   Recent Results (from the past 240 hour(s))  Culture, respiratory (non-expectorated)     Status: None     Collection Time: 01/13/21  3:04 PM   Specimen: Tracheal Aspirate; Respiratory  Result Value Ref Range Status   Specimen Description   Final    TRACHEAL ASPIRATE Performed at [facility], [address] [place], [place] [address]    Special Requests   Final    NONE Performed at [facility], [address] [place], [place] [address]    Gram Stain   Final    ABUNDANT WBC PRESENT, PREDOMINANTLY PMN MODERATE GRAM POSITIVE COCCI Performed at [facility], [place] [address] [place], [place]  [address]    Culture FEW STAPHYLOCOCCUS AUREUS  Final   Report Status 01/16/2021 FINAL  Final   Organism ID, Bacteria STAPHYLOCOCCUS AUREUS  Final      Susceptibility   Staphylococcus aureus - MIC*    CIPROFLOXACIN <=0.5 SENSITIVE Sensitive     ERYTHROMYCIN <=0.25 SENSITIVE Sensitive     GENTAMICIN <=0.5 SENSITIVE Sensitive     OXACILLIN 0.5 SENSITIVE Sensitive     TETRACYCLINE <=1 SENSITIVE Sensitive     VANCOMYCIN 1 SENSITIVE Sensitive     TRIMETH/SULFA <=10 SENSITIVE Sensitive     CLINDAMYCIN <=0.25 SENSITIVE Sensitive     RIFAMPIN <=0.5 SENSITIVE Sensitive     Inducible Clindamycin NEGATIVE Sensitive     * FEW STAPHYLOCOCCUS AUREUS  Culture, respiratory (non-expectorated)     Status: None   Collection Time: 01/19/21 10:41 PM   Specimen: Tracheal Aspirate; Respiratory  Result Value Ref Range Status   Specimen Description   Final    TRACHEAL ASPIRATE Performed at [facility] [address] [place], [place] [address]    Special Requests   Final    NONE Performed at [facility], [address] [place], [place] [address]    Gram Stain   Final    FEW WBC PRESENT,BOTH PMN AND MONONUCLEAR RARE BUDDING YEAST SEEN Performed at [facility], [place] [address], [place], [place] [address]    Culture FEW CANDIDA ALBICANS  Final   Report Status 01/22/2021 FINAL  Final          IMAGING    No results found.   Nutrition Status: Nutrition Problem: Inadequate oral intake Etiology: inability to eat Signs/Symptoms: NPO status Interventions: Tube feeding,Prostat     Indwelling Urinary Catheter continued, requirement due to   Reason to continue Indwelling Urinary Catheter strict Intake/Output monitoring for hemodynamic instability   Central Line/ continued, requirement due to  Reason to continue [place] of central venous pressure or other hemodynamic parameters and poor IV access   Ventilator continued, requirement due to severe  respiratory failure   Ventilator Sedation RASS 0 to -2      ASSESSMENT AND PLAN SYNOPSIS    30 YO MORBIDLY OBESE AAM WITH SEVERE ARDS DUE TO COVID 19 PNEUMONIA WITH ACUTE RENAL FAILURE AND MULTIORGAN AIL FAILURE COMPLICATED BY FAILURE TO WEAN FROM VENT WITH ACUTE ABD ILEUS  Plan for BOWEL REST AND HD TODAY AM AND THEN PLAN FOR SAT/SBT and ASSESS FOR EXTUBATION    Acute hypoxemic respiratory failure due to COVID-19 pneumonia/ARDS Mechanical ventilation via ARDS protocol, target PRVC 6 cc/kg Wean PEEP and FiO2 as able Goal plateau pressure less than 30, driving pressure less than 15 VAP prevention order set IV STEROIDS Therapy Follow inflammatory markers as needed with CRP Vitamin C, zinc Plan to repeat and check resp cultures as needed   Severe ACUTE Hypoxic and Hypercapnic Respiratory Failure -continue Full MV support -continue Bronchodilator Therapy -  Wean Fio2 and PEEP as tolerated -will perform SAT/SBT when respiratory parameters are met -VAP/VENT bundle implementation  ACUTE DIASTOLIC CARDIAC FAILURE-  -oxygen as needed -Lasix as tolerated   Morbid obesity, possible OSA.   Will certainly impact respiratory mechanics, ventilator weaning Suspect will need to consider additional PEEP   ACUTE KIDNEY INJURY/Renal Failure -continue Foley Catheter-assess need -Avoid nephrotoxic agents -Follow urine output, BMP -Ensure adequate renal perfusion, optimize oxygenation -Renal dose medications HD as needed    NEUROLOGY need for sedation Goal RASS -1   CARDIAC ICU monitoring  ID -continue IV abx as prescibed -follow up cultures  GI GI PROPHYLAXIS as indicated   DIET-->TF's as tolerated Constipation protocol as indicated  ENDO - will use ICU hypoglycemic\Hyperglycemia protocol if indicated    ELECTROLYTES -follow labs as needed -replace as needed -pharmacy consultation and following   DVT/GI PRX ordered and assessed TRANSFUSIONS AS  NEEDED MONITOR FSBS I Assessed the need for Labs I Assessed the need for Foley I Assessed the need for Central Venous Line Family Discussion when available I Assessed the need for Mobilization I made an Assessment of medications to be adjusted accordingly Safety Risk assessment completed   CASE DISCUSSED IN MULTIDISCIPLINARY ROUNDS WITH ICU TEAM  Critical Care Time devoted to patient care services described in this note is 46 minutes.   Overall, patient is critically ill, prognosis is guarded.  Patient with Multiorgan failure and at high risk for cardiac arrest and death.    Corrin Parker, M.D.  [facility]  Medical Director [place] Director [facility] Cardio-Pulmonary Department

## 2021-01-23 NOTE — Progress Notes (Signed)
Erik [name] was extubated at 1043 to 2L [place].  Os sat=100%, HR=76, RR=22.  Pt tolerated well and is resting.

## 2021-01-24 DIAGNOSIS — I2111 ST elevation (STEMI) myocardial infarction involving right coronary artery: Secondary | ICD-10-CM

## 2021-01-24 LAB — RENAL FUNCTION PANEL
Albumin: 2.3 g/dL — ABNORMAL LOW (ref 3.5–5.0)
Anion gap: 14 (ref 5–15)
BUN: 67 mg/dL — ABNORMAL HIGH (ref 6–20)
CO2: 20 mmol/L — ABNORMAL LOW (ref 22–32)
Calcium: 8.8 mg/dL — ABNORMAL LOW (ref 8.9–10.3)
Chloride: 114 mmol/L — ABNORMAL HIGH (ref 98–111)
Creatinine, Ser: 3.15 mg/dL — ABNORMAL HIGH (ref 0.61–1.24)
GFR, Estimated: 26 mL/min — ABNORMAL LOW (ref >60)
Glucose, Bld: 159 mg/dL — ABNORMAL HIGH (ref 70–99)
Phosphorus: 6.1 mg/dL — ABNORMAL HIGH (ref 2.5–4.6)
Potassium: 4.6 mmol/L (ref 3.5–5.1)
Sodium: 148 mmol/L — ABNORMAL HIGH (ref 135–145)

## 2021-01-24 LAB — CBC WITH DIFFERENTIAL/PLATELET
Abs Immature Granulocytes: 0.29 10*3/uL — ABNORMAL HIGH (ref 0.00–0.07)
Basophils Absolute: 0 10*3/uL (ref 0.0–0.1)
Basophils Relative: 0 %
Eosinophils Absolute: 0 10*3/uL (ref 0.0–0.5)
Eosinophils Relative: 0 %
HCT: 30.2 % — ABNORMAL LOW (ref 39.0–52.0)
Hemoglobin: 9.2 g/dL — ABNORMAL LOW (ref 13.0–17.0)
Immature Granulocytes: 2 %
Lymphocytes Relative: 9 %
Lymphs Abs: 1.2 10*3/uL (ref 0.7–4.0)
MCH: 25.5 pg — ABNORMAL LOW (ref 26.0–34.0)
MCHC: 30.5 g/dL (ref 30.0–36.0)
MCV: 83.7 fL (ref 80.0–100.0)
Monocytes Absolute: 1.7 10*3/uL — ABNORMAL HIGH (ref 0.1–1.0)
Monocytes Relative: 13 %
Neutro Abs: 10 10*3/uL — ABNORMAL HIGH (ref 1.7–7.7)
Neutrophils Relative %: 76 %
Platelets: 371 10*3/uL (ref 150–400)
RBC: 3.61 MIL/uL — ABNORMAL LOW (ref 4.22–5.81)
RDW: 15.2 % (ref 11.5–15.5)
WBC: 13.2 10*3/uL — ABNORMAL HIGH (ref 4.0–10.5)
nRBC: 0.2 % (ref 0.0–0.2)

## 2021-01-24 LAB — GLUCOSE, CAPILLARY
Glucose-Capillary: 145 mg/dL — ABNORMAL HIGH (ref 70–99)
Glucose-Capillary: 135 mg/dL — ABNORMAL HIGH (ref 70–99)
Glucose-Capillary: 132 mg/dL — ABNORMAL HIGH (ref 70–99)
Glucose-Capillary: 190 mg/dL — ABNORMAL HIGH (ref 70–99)
Glucose-Capillary: 225 mg/dL — ABNORMAL HIGH (ref 70–99)

## 2021-01-24 LAB — FIBRIN DERIVATIVES D-DIMER (ARMC ONLY): Fibrin derivatives D-dimer (ARMC): 2578.07 ng/mL (FEU) — ABNORMAL HIGH (ref 0.00–499.00)

## 2021-01-24 LAB — C-REACTIVE PROTEIN: CRP: 5.3 mg/dL — ABNORMAL HIGH (ref <1.0)

## 2021-01-24 LAB — FERRITIN: Ferritin: 597 ng/mL — ABNORMAL HIGH (ref 24–336)

## 2021-01-24 LAB — MAGNESIUM: Magnesium: 2.3 mg/dL (ref 1.7–2.4)

## 2021-01-24 NOTE — Progress Notes (Signed)
Inpatient Rehab Admissions Coordinator Note:   Per PT recommendation, pt was screened for CIR candidacy by Wolfgang Phoenix, MS, CCC-SLP.  At this time we are not recommending an inpatient rehab consult. Noted COVID + 01/12/21. Patients are eligible to be considered for admit to CIR when cleared from airborne precautions by acute MD or Infectious Disease regardless of date of onset.  Otherwise, they will need to be >20 days from their positive test with recovery/improvement in symptoms or 2 negative tests. Please contact me with questions.    Wolfgang Phoenix, MS, CCC-SLP Admissions Coordinator [phone] 01/24/21 4:21 PM

## 2021-01-24 NOTE — Progress Notes (Signed)
Pt turned Q2H this shift, stool continues to ooze around flexi seal, pt very difficult to keep clean d/t body habitus, skin continues to slough of in groin/peri areas, cleaned and barrier cream applied, Pt alert, oriented, talks in a very low voice, keeps oral suction device in his hand and uses call button appropriately.  Reported this afternoon, "I feel like I can't breathe."  I told him he was doing very well and his current oxygen level was 100%.  He seemed relieved to hear this and his anxiety decreased

## 2021-01-24 NOTE — Progress Notes (Addendum)
PROGRESS NOTE    Erik [name]  SNK:[id number] DOB: 31-Jan-1991 DOA: 01/12/2021 PCP: Pcp, No   Brief Narrative:  30 yo morbidly obese male with ASTHMA now with progressive multiorgan failure with COVID 19 pneumonia and ARDS with severe acidosis and severe hypoxia with ARDS with acute renal failure; now extubated and off renal replacement therapy.   Summary of events:  2/1 Emergently intubated 2/2 severe resp failure, vasc cath placed 2/3 severe resp failure 01/15/2021-patient is critically Ill, CRRT continued. Borderline low blood glucose today.  01/16/2021-patient with thickened heavy ETT secretions. RN reporting inadequate sedation with patient making motions to remove airway, we have advanced RASS to range -3 to -4. Remains critically ill with ongoing renal replacement therapy and COVID treatment.  01/17/2021-patient had thickened secretions again today, he was lavaged with saline by RT today with good response. We will try awakening trial again in AM.  01/18/2021-remains on vent, on CRRT 01/19/2021-acute MI 2/9 s/p left heart cath, multiorgan failure+vomiting 2/10 failed wean trial 2/11 - successfully extubated  Assessment & Plan:   Principal Problem:   Acute hypoxemic respiratory failure due to COVID-19 [facility]) Active Problems:   DKA (diabetic ketoacidosis) (HCC)   Obesity, Class III, BMI 40-49.9 (morbid obesity) (HCC)   Acute metabolic encephalopathy   ARDS (adult respiratory distress syndrome) (HCC)   ST elevation myocardial infarction (STEMI) (HCC)   Acute respiratory failure with hypoxia (HCC)   Acute hypoxemic respiratory failure due to severe COVID-19 pneumonia/ARDS Rquire Mechanical ventilation  Successfully extubated 01/22/21 Completed remdesivir Due to suspected CAP no baricitinib/actemra Completed antibiotics Continue steroid taper Labs downtrending appropriately Recent Labs    01/22/21 0526 01/23/21 0517 01/24/21 0615  FERRITIN 658* 642* 597*  CRP  11.6* 8.9* 5.3*    AKI with acute renal failure, resolving - Foley catheter ongoing - I/O remains negative off renal replacement - appreciate nephrology insight/recs Lab Results  Component Value Date   CREATININE 3.15 (H) 01/24/2021   CREATININE 3.44 (H) 01/23/2021   CREATININE 3.39 (H) 01/22/2021    Acute inferior STEMI with associated diastolic heart failure and Vtach -Cardiology following - appreciate insight/recs -No recurrent episodes of VT reported overnight/am  -Wean oxygen as tolerated -Lasix previously discontinued in the setting of renal failure - I/O negative  Intake/Output Summary (Last 24 hours) at 01/24/2021 2044 Last data filed at 01/24/2021 1800 Gross per 24 hour  Intake 727.51 ml  Output 2225 ml  Net -1497.49 ml   Morbid obesity, high risk for OSA (undiagnosed).  Will certainly impact respiratory mechanics, ventilator weaning Suspect will need to consider additional PEEP  Profound hyperglycemia secondary to newly diagnosed uncontrolled diabetes - insulin dependent Lab Results  Component Value Date   HGBA1C 13.6 (H) 01/12/2021  Continue sliding scale insulin Increase levemir as indicated - currently on 24u HS Hypoglycemic protocol as needed   DVT prophylaxis: heparin subQ Code Status: Full Family Communication: Updated over the phone  Status is: Inpt  Dispo: The patient is from: Home              Anticipated d/c is to: TBD              Anticipated d/c date is: >27h               Patient currently NOT medically stable for discharge  Consultants:   ICU, Nephrology  Antimicrobials:  Completed azithromycin/ceftriaxone 5 day course 01/16/21 Doxycycline stopped 01/18/21 Completed Remdesivir 01/16/21  Erythromycin 2/10 - ongoing  Subjective: No acute issues/events overnight -  patient continues to be poor historian this am  Objective: Vitals:   01/24/21 0100 01/24/21 0200 01/24/21 0300 01/24/21 0400  BP: (!) 159/85 (!) 157/84 (!) 157/85 (!) 154/95   Pulse: (!) 110 (!) 110 (!) 116 98  Resp: (!) 21 20 (!) 21 (!) 25  Temp: 97.88 F (36.6 C) 97.7 F (36.5 C) (!) 97.52 F (36.4 C) (!) 97.52 F (36.4 C)  TempSrc:      SpO2: 97% 98% 98% 100%  Weight:      Height:        Intake/Output Summary (Last 24 hours) at 01/24/2021 0817 Last data filed at 01/24/2021 0427 Gross per 24 hour  Intake 1610.03 ml  Output 2375 ml  Net -764.97 ml   Filed Weights   01/19/21 0500 01/22/21 0500 01/23/21 0500  Weight: (!) 192.8 kg (!) 194.6 kg (!) 195.5 kg    Examination:  General exam: Appears calm and comfortable, no acute distress Respiratory system: Bilateral ronchi/coarse breath sounds. Cardiovascular system: RRR Gastrointestinal system: Abdomen is obese, soft and nontender. No organomegaly or masses felt. Normal bowel sounds heard. Central nervous system: No focal neurological deficits. Extremities: Symmetric 5 x 5 power. Skin: No rashes, lesions  Data Reviewed: I have personally reviewed following labs and imaging studies  CBC: Recent Labs  Lab 01/20/21 0423 01/20/21 1003 01/21/21 0502 01/22/21 0526 01/23/21 0517 01/24/21 0615  WBC 15.0* 18.0* 17.1* 13.7* 10.9* 13.2*  NEUTROABS 8.7*  --  9.6* 8.4* 7.6 10.0*  HGB 8.6* 9.0* 8.9* 8.0* 8.3* 9.2*  HCT 27.4* 28.6* 28.0* 25.8* 27.0* 30.2*  MCV 82.5 82.7 82.1 83.5 83.3 83.7  PLT 136* 138* 197 239 292 371   Basic Metabolic Panel: Recent Labs  Lab 01/20/21 0424 01/20/21 1003 01/20/21 2155 01/21/21 0502 01/22/21 0526 01/23/21 0517 01/24/21 0615  NA 138  --   --  139 139 142 148*  K 2.8*  --  4.1 3.3* 3.9 5.0 4.6  CL 103  --   --  107 107 109 114*  CO2 23  --   --  20* 21* 21* 20*  GLUCOSE 135*  --   --  141* 128* 216* 159*  BUN 51*  --   --  62* 51* 62* 67*  CREATININE 3.37* 3.59*  --  4.06* 3.39* 3.44* 3.15*  CALCIUM 8.2*  --   --  8.4* 8.3* 8.8* 8.8*  MG  --   --  2.2 2.2 2.2 2.4 2.3  PHOS 5.0*  --   --  6.0* 5.7* 7.0* 6.1*   GFR: Estimated Creatinine Clearance: 61.1  mL/min (A) (by C-G formula based on SCr of 3.15 mg/dL (H)). Liver Function Tests: Recent Labs  Lab 01/20/21 0424 01/21/21 0502 01/22/21 0526 01/23/21 0517 01/24/21 0615  ALBUMIN 1.9* 2.0* 2.1* 1.9* 2.3*   No results for input(s): LIPASE, AMYLASE in the last 168 hours. No results for input(s): AMMONIA in the last 168 hours. Coagulation Profile: No results for input(s): INR, PROTIME in the last 168 hours. Cardiac Enzymes: No results for input(s): CKTOTAL, CKMB, CKMBINDEX, TROPONINI in the last 168 hours. BNP (last 3 results) No results for input(s): PROBNP in the last 8760 hours. HbA1C: No results for input(s): HGBA1C in the last 72 hours. CBG: Recent Labs  Lab 01/23/21 1534 01/23/21 1940 01/23/21 2321 01/24/21 0327 01/24/21 0725  GLUCAP 209* 222* 198* 145* 135*   Lipid Profile: Recent Labs    01/22/21 0526  CHOL 167  HDL 33*  LDLCALC 90  TRIG  222*  CHOLHDL 5.1   Thyroid Function Tests: No results for input(s): TSH, T4TOTAL, FREET4, T3FREE, THYROIDAB in the last 72 hours. Anemia Panel: Recent Labs    01/23/21 0517 01/24/21 0615  FERRITIN 642* 597*   Sepsis Labs: No results for input(s): PROCALCITON, LATICACIDVEN in the last 168 hours.  Recent Results (from the past 240 hour(s))  Culture, respiratory (non-expectorated)     Status: None   Collection Time: 01/19/21 10:41 PM   Specimen: Tracheal Aspirate; Respiratory  Result Value Ref Range Status   Specimen Description   Final    TRACHEAL ASPIRATE Performed at [facility], [address] [place], [place] [address]    Special Requests   Final    NONE Performed at [facility], [address] [place], [place] [address]    Gram Stain   Final    FEW WBC PRESENT,BOTH PMN AND MONONUCLEAR RARE BUDDING YEAST SEEN Performed at [facility], [address] [place], [place] [address]    Culture FEW CANDIDA ALBICANS  Final   Report Status 01/22/2021 FINAL  Final     Radiology  Studies: No results found.  Scheduled Meds: . aspirin  81 mg Per Tube Daily  . atorvastatin  80 mg Per Tube Daily  . Chlorhexidine Gluconate Cloth  6 each Topical Daily  . docusate  100 mg Per Tube BID  . feeding supplement (VITAL 1.5 CAL)  1,000 mL Per Tube Q24H  . heparin injection (subcutaneous)  5,000 Units Subcutaneous Q8H  . insulin aspart  0-20 Units Subcutaneous Q4H  . insulin aspart  5 Units Subcutaneous Q4H  . insulin detemir  24 Units Subcutaneous QHS  . insulin detemir  44 Units Subcutaneous Daily  . mouth rinse  15 mL Mouth Rinse BID  . methylPREDNISolone (SOLU-MEDROL) injection  40 mg Intravenous Q24H  . metoCLOPramide  5 mg Per Tube Q8H  . metoprolol tartrate  12.5 mg Per Tube BID  . multivitamin  1 tablet Per Tube QHS  . polyethylene glycol  17 g Per Tube Daily  . sodium chloride flush  3 mL Intravenous Q12H   Continuous Infusions: . sodium chloride    . dextrose    . erythromycin 250 mg (01/24/21 2778)  . famotidine (PEPCID) IV Stopped (01/23/21 1025)     LOS: 12 days    Time spent:   Azucena Fallen, DO [facility]  If 7PM-7AM, please contact night-coverage [email or web address]  01/24/2021, 8:17 AM

## 2021-01-24 NOTE — Evaluation (Signed)
Physical Therapy Evaluation Patient Details Name: Erik [name] MRN: [id number] DOB: 05/25/1991 Today's Date: 01/24/2021   History of Present Illness  presented to ER secondary to progressive weakness, decreased PO intake, nausea/vomiting; admitted for management of acute hypoxic respiratory failure due to COVID-19 viral infection, DKA and metabolic encephalopathy.  Hospital course significant for emergent intubation 2/1-2/12/22; CRRT 2/3-01/20/21 via L temporary IJ dialysis catheter (R femoral removed); code STEMI with emergent cardiac cath (showing no significant coronary disease) 01/20/21 with elevated troponin associated with myocarditis.  Clinical Impression  Patient resting in bed upon arrival to session; awake, alert and oriented to basic information.  Follow simple commands and agreeable to participation with session.  Very low phonation with poor vocal quality; does demonstrate productive (and fairly strong cough).  Denies pain. Profoundly weak throughout all extremities, L > R hemibody, requiring act assist for movement throughout full ROM, all planes.  Fatigues very quickly; initially requiring manual facilitation for targeted, isolated muscle activation.  Currently dependant for all repositioning and movement in bed; anticipate need for +2-3 to safely complete transition to unsupported sitting and any further mobility assessment.  Will continue to assess/progress as able. Of note, vitals stable and WFL throughout session; tolerated activity well.  Per nephrology, plans for discontinuation of L IJ temporary dialysis catheter next date. Would benefit from skilled PT to address above deficits and promote optimal return to PLOF.; recommend transition to acute inpatient rehab upon discharge for high-intensity, post-acute rehab services.      Follow Up Recommendations CIR    Equipment Recommendations       Recommendations for Other Services       Precautions / Restrictions  Precautions Precautions: Fall Restrictions Weight Bearing Restrictions: No      Mobility  Bed Mobility Overal bed mobility: Needs Assistance             General bed mobility comments: dependant for all repositioning, mobility attempts at this time    Transfers                 General transfer comment: unsafe/unable; anticipate need for +2-3 to safely complete  Ambulation/Gait             General Gait Details: unsafe/unable; anticipate need for +2-3 to safely complete  Stairs            Wheelchair Mobility    Modified Rankin (Stroke Patients Only)       Balance                                             Pertinent Vitals/Pain Pain Assessment: No/denies pain    Home Living Family/patient expects to be discharged to:: Private residence Living Arrangements: Parent Available Help at Discharge: Family Type of Home: House       Home Layout: Multi-level;Bed/bath upstairs Home Equipment: None      Prior Function Level of Independence: Independent         Comments: Indep with ADLs, household and community mobilization without assist device; denies fall history; no home O2; enjoys video games.     Hand Dominance   Dominant Hand: Right    Extremity/Trunk Assessment   Upper Extremity Assessment Upper Extremity Assessment: Generalized weakness (R UE grossly 3-/5, L UE grossly 2+/5; profoundly weak throughout)    Lower Extremity Assessment Lower Extremity Assessment: Generalized weakness (bilat LEs grossly 2+/5 throughout,  requiring act assist from therapist for against-gravity movement in all planes; bilat ankles to neutral.  Profoundly weak throughout)       Communication   Communication:  (very low phonation, poor voice quality)  Cognition Arousal/Alertness: Awake/alert Behavior During Therapy: WFL for tasks assessed/performed Overall Cognitive Status: Within Functional Limits for tasks assessed                                  General Comments: oriented to self, location, situation and year; follows commands and demonstrates active participation throughout session      General Comments      Exercises Other Exercises Other Exercises: Supine UE therex, 1x10, act assist ROM: shoulder flex/ext/IR/ER, elbow flex/ext, forearm pronation/supination, hand grasp/release; increased assist required throughout L UE Other Exercises: Supine LE therex, 1x10, act assist ROM: ankle pumps, quad sets, SAQs, hip abduct/adduct and heel slides with resisted extension; increased assist required L LE throughout Other Exercises: Cervical motion-good, WFL rotation; able to pick head up from pillow, bring chin to chest   Assessment/Plan    PT Assessment Patient needs continued PT services  PT Problem List Decreased strength;Decreased activity tolerance;Decreased range of motion;Decreased balance;Decreased mobility;Decreased coordination;Decreased knowledge of use of DME;Decreased safety awareness;Decreased knowledge of precautions;Cardiopulmonary status limiting activity;Obesity       PT Treatment Interventions DME instruction;Gait training;Stair training;Functional mobility training;Therapeutic activities;Therapeutic exercise;Balance training;Patient/family education;Cognitive remediation    PT Goals (Current goals can be found in the Care Plan section)  Acute Rehab PT Goals Patient Stated Goal: "will you help me walk again?" PT Goal Formulation: With patient Time For Goal Achievement: 02/07/21 Potential to Achieve Goals: Good Additional Goals Additional Goal #1: Assess and establish goals for OOB mobility as appropriate.    Frequency Min 2X/week   Barriers to discharge        Co-evaluation               AM-PAC PT "6 Clicks" Mobility  Outcome Measure Help needed turning from your back to your side while in a flat bed without using bedrails?: Total Help needed moving from lying on your  back to sitting on the side of a flat bed without using bedrails?: Total Help needed moving to and from a bed to a chair (including a wheelchair)?: Total Help needed standing up from a chair using your arms (e.g., wheelchair or bedside chair)?: Total Help needed to walk in hospital room?: Total Help needed climbing 3-5 steps with a railing? : Total 6 Click Score: 6    End of Session   Activity Tolerance: Patient tolerated treatment well Patient left: in bed;with call bell/phone within reach;with bed alarm set Nurse Communication: Mobility status PT Visit Diagnosis: Muscle weakness (generalized) (M62.81);Difficulty in walking, not elsewhere classified (R26.2)    Time: 8676-1950 PT Time Calculation (min) (ACUTE ONLY): 28 min   Charges:   PT Evaluation $PT Eval High Complexity: 1 High PT Treatments $Therapeutic Exercise: 8-22 mins   Alizia Greif H. Manson Passey, PT, DPT, NCS 01/24/21, 1:46 PM [phone]

## 2021-01-24 NOTE — Progress Notes (Addendum)
Progress Note  Patient Name: Erik [name] Date of Encounter: 01/24/2021  [facility] Cardiologist: New - End  Subjective   Extubated 2/12.  No acute overnight events. No further VT.   Inpatient Medications    Scheduled Meds:  aspirin  81 mg Per Tube Daily   atorvastatin  80 mg Per Tube Daily   Chlorhexidine Gluconate Cloth  6 each Topical Daily   docusate  100 mg Per Tube BID   feeding supplement (VITAL 1.5 CAL)  1,000 mL Per Tube Q24H   heparin injection (subcutaneous)  5,000 Units Subcutaneous Q8H   insulin aspart  0-20 Units Subcutaneous Q4H   insulin aspart  5 Units Subcutaneous Q4H   insulin detemir  24 Units Subcutaneous QHS   insulin detemir  44 Units Subcutaneous Daily   mouth rinse  15 mL Mouth Rinse BID   methylPREDNISolone (SOLU-MEDROL) injection  40 mg Intravenous Q24H   metoCLOPramide  5 mg Per Tube Q8H   metoprolol tartrate  12.5 mg Per Tube BID   multivitamin  1 tablet Per Tube QHS   polyethylene glycol  17 g Per Tube Daily   sodium chloride flush  3 mL Intravenous Q12H   Continuous Infusions:  sodium chloride     dextrose     erythromycin 250 mg (01/24/21 0607)   famotidine (PEPCID) IV Stopped (01/23/21 1025)   PRN Meds: sodium chloride, acetaminophen (TYLENOL) oral liquid 160 mg/5 mL, dextrose, docusate sodium, hydrALAZINE, metoprolol tartrate, ondansetron **OR** ondansetron (ZOFRAN) IV, ondansetron (ZOFRAN) IV, polyethylene glycol, sodium chloride flush   Vital Signs    Vitals:   01/24/21 0100 01/24/21 0200 01/24/21 0300 01/24/21 0400  BP: (!) 159/85 (!) 157/84 (!) 157/85 (!) 154/95  Pulse: (!) 110 (!) 110 (!) 116 98  Resp: (!) 21 20 (!) 21 (!) 25  Temp: 97.88 F (36.6 C) 97.7 F (36.5 C) (!) 97.52 F (36.4 C) (!) 97.52 F (36.4 C)  TempSrc:      SpO2: 97% 98% 98% 100%  Weight:      Height:        Intake/Output Summary (Last 24 hours) at 01/24/2021 0740 Last data filed at 01/24/2021 0427 Gross per 24 hour  Intake 1610.03 ml  Output  2575 ml  Net -964.97 ml   Last 3 Weights 01/23/2021 01/22/2021 01/19/2021  Weight (lbs) 431 lb 429 lb 425 lb  Weight (kg) 195.5 kg 194.593 kg 192.779 kg      Telemetry    SR with brief episode of SB, no further VT. - Personally Reviewed  ECG    No new tracing.  Physical Exam   Given Covid status, please see MD attestation.   Labs    High Sensitivity Troponin:   Recent Labs  Lab 01/20/21 0518 01/20/21 1003 01/20/21 1620 01/20/21 2350 01/21/21 0901  TROPONINIHS 47* 4,189* 6,440* 6,816* 4,519*      Chemistry Recent Labs  Lab 01/22/21 0526 01/23/21 0517 01/24/21 0615  NA 139 142 148*  K 3.9 5.0 4.6  CL 107 109 114*  CO2 21* 21* 20*  GLUCOSE 128* 216* 159*  BUN 51* 62* 67*  CREATININE 3.39* 3.44* 3.15*  CALCIUM 8.3* 8.8* 8.8*  ALBUMIN 2.1* 1.9* 2.3*  GFRNONAA 24* 24* 26*  ANIONGAP 11 12 14      Hematology Recent Labs  Lab 01/22/21 0526 01/23/21 0517 01/24/21 0615  WBC 13.7* 10.9* 13.2*  RBC 3.09* 3.24* 3.61*  HGB 8.0* 8.3* 9.2*  HCT 25.8* 27.0* 30.2*  MCV 83.5 83.3  83.7  MCH 25.9* 25.6* 25.5*  MCHC 31.0 30.7 30.5  RDW 14.7 14.7 15.2  PLT 239 292 371    BNP Recent Labs  Lab 01/17/21 1536  BNP 60.4     DDimer No results for input(s): DDIMER in the last 168 hours.   Radiology    No results found.  Cardiac Studies   LHC 01/20/2021: Conclusions: Minimal coronary artery plaquing without angiographically significant coronary artery disease. Moderately reduced left ventricular contraction with global hypokinesis. Mildly elevated left ventricular filling pressure (LVEDP ~20 mmHg).   Recommendations: No obvious coronary artery lesion to explain ST segment changes and sustained ventricular tachycardia.  Possible causes include metabolic/electrolyte derangements and myocarditis in the setting of COVID-19. Trend HS-TnI until it has peaked, then stop. Obtain transthoracic echocardiogram. If blood pressure tolerates, consider addition of low-dose  beta-blocker. If patient has recurrent VT, consider IV amiodarone. Replete electrolytes to maintain K > 4.0 and Mg > 2.0. __________   2D echo 01/20/2021: 1. Left ventricular ejection fraction, by estimation, is 60 to 65%. The  left ventricle has normal function. The left ventricle has no regional  wall motion abnormalities. Left ventricular diastolic parameters were  normal.   2. Right ventricular systolic function is normal. The right ventricular  size is normal.   3. The mitral valve is normal in structure. No evidence of mitral valve  regurgitation. No evidence of mitral stenosis.   4. The aortic valve is normal in structure. Aortic valve regurgitation is  not visualized. No aortic stenosis is present.   5. The inferior vena cava is normal in size with greater than 50%  respiratory variability, suggesting right atrial pressure of 3 mmHg.  Patient Profile     30 y.o. male with history of asthma and morbid obesity admitted with COVID PNA complicated by AKI, inferior STEMI, and VT.  Assessment & Plan    Inferior STEMI: Cath without significant CAD.  Echo with normal LV and no obvious WMA.  HS-TnI peaked at 6,816.  Question thrombosis with spontaneous lysis, tiny branch occlusion, or myocarditis. He has completed 48 hour IV heparin infusion. Consider DAPT with aspirin and clopidogrel x 12 months if no bleeding issues; will defer adding clopidogrel as patient is being considered for trach in the near future per CCM. LDL 90.  Continue atorvastatin 80 mg daily. Follow up as outpatient.  Metoprolol tartrate 12.5 mg BID added 2/11, though has not received any to date.  Could consider cardiac MRI (would need to be done in [place]) if patient has meaningful recovery from his multiorgan failure.  VT: No recurrence.  Recommend metoprolol trartrate 12.5 mg BID as above. Maintain K>4.0, Mg>2.0.  COVID-19: Per CCM/IM.  For questions or updates, please contact [facility] Please  consult [email or web address] for contact info under   Signed, Eula Listen, PA-C  01/24/2021, 7:40 AM    Patient examined chart reviewed Rhythm stable with no further VT. Etiology of troponin bump not clear. No epicardial CAD at cath May have had embolic event in hypercoagulable setting of COVID. Myocarditis less likely with normal EF by echo. Not likely a candidate for cardiac MRI given obesity Continue ASA and lopressor  Charlton Haws MD [facility]

## 2021-01-24 NOTE — Progress Notes (Signed)
[facility]  ROUNDING NOTE   Subjective:   Extubated yesterday.  UOP Creatinine 3.15 (3.44)  D10W at 57mL/hr  Na 148 (142)  Objective:  Vital signs in last 24 hours:  Temp:  [96.26 F (35.7 C)-98.42 F (36.9 C)] 97.52 F (36.4 C) (02/13 0400) Pulse Rate:  [58-119] 98 (02/13 0400) Resp:  [19-28] 25 (02/13 0400) BP: (131-182)/(84-111) 154/95 (02/13 0400) SpO2:  [97 %-100 %] 100 % (02/13 0400) FiO2 (%):  [24 %] 24 % (02/12 1002)  Weight change:  Filed Weights   01/19/21 0500 01/22/21 0500 01/23/21 0500  Weight: (!) 192.8 kg (!) 194.6 kg (!) 195.5 kg    Intake/Output: I/O last 3 completed shifts: In: 1610 [I.V.:1070.4; IV Piggyback:539.6] Out: 3450 [Urine:3050; Stool:400]   Intake/Output this shift:  No intake/output data recorded.  Physical Exam: General: Laying in bed  Head: [place]/AT  Eyes: Anicteric, PERRL  Neck:  trachea midline  Lungs:  Diminished bilaterally  Heart: tachycardia  Abdomen:  Soft, obese  Extremities:  no peripheral edema.  Neurologic: Intubated and sedated  Skin: No lesions  Access: LIJ permcath    Basic Metabolic Panel: Recent Labs  Lab 01/20/21 0424 01/20/21 1003 01/20/21 2155 01/21/21 0502 01/22/21 0526 01/23/21 0517 01/24/21 0615  NA 138  --   --  139 139 142 148*  K 2.8*  --  4.1 3.3* 3.9 5.0 4.6  CL 103  --   --  107 107 109 114*  CO2 23  --   --  20* 21* 21* 20*  GLUCOSE 135*  --   --  141* 128* 216* 159*  BUN 51*  --   --  62* 51* 62* 67*  CREATININE 3.37* 3.59*  --  4.06* 3.39* 3.44* 3.15*  CALCIUM 8.2*  --   --  8.4* 8.3* 8.8* 8.8*  MG  --   --  2.2 2.2 2.2 2.4 2.3  PHOS 5.0*  --   --  6.0* 5.7* 7.0* 6.1*    Liver Function Tests: Recent Labs  Lab 01/20/21 0424 01/21/21 0502 01/22/21 0526 01/23/21 0517 01/24/21 0615  ALBUMIN 1.9* 2.0* 2.1* 1.9* 2.3*   No results for input(s): LIPASE, AMYLASE in the last 168 hours. No results for input(s): AMMONIA in the last 168 hours.  CBC: Recent Labs   Lab 01/20/21 0423 01/20/21 1003 01/21/21 0502 01/22/21 0526 01/23/21 0517 01/24/21 0615  WBC 15.0* 18.0* 17.1* 13.7* 10.9* 13.2*  NEUTROABS 8.7*  --  9.6* 8.4* 7.6 10.0*  HGB 8.6* 9.0* 8.9* 8.0* 8.3* 9.2*  HCT 27.4* 28.6* 28.0* 25.8* 27.0* 30.2*  MCV 82.5 82.7 82.1 83.5 83.3 83.7  PLT 136* 138* 197 239 292 371    Cardiac Enzymes: No results for input(s): CKTOTAL, CKMB, CKMBINDEX, TROPONINI in the last 168 hours.  BNP: Invalid input(s): POCBNP  CBG: Recent Labs  Lab 01/23/21 1534 01/23/21 1940 01/23/21 2321 01/24/21 0327 01/24/21 0725  GLUCAP 209* 222* 198* 145* 135*    Microbiology: Results for orders placed or performed during the hospital encounter of 01/12/21  Blood culture (routine single)     Status: None   Collection Time: 01/12/21  9:05 AM   Specimen: BLOOD  Result Value Ref Range Status   Specimen Description BLOOD LEFT AC  Final   Special Requests   Final    BOTTLES DRAWN AEROBIC ONLY Blood Culture results may not be optimal due to an inadequate volume of blood received in culture bottles   Culture   Final  NO GROWTH 5 DAYS Performed at [facility], [address] [place], [place] [address]    Report Status 01/17/2021 FINAL  Final  SARS Coronavirus 2 by RT PCR (hospital order, performed in [facility] hospital lab) Nasopharyngeal Nasopharyngeal Swab     Status: Abnormal   Collection Time: 01/12/21  9:36 AM   Specimen: Nasopharyngeal Swab  Result Value Ref Range Status   SARS Coronavirus 2 POSITIVE (A) NEGATIVE Final    Comment: RESULT CALLED TO, READ BACK BY AND VERIFIED WITH: ANNA JASPER ON 01/12/21 AT 1111 SDR (NOTE) SARS-CoV-2 target nucleic acids are DETECTED  SARS-CoV-2 RNA is generally detectable in upper respiratory specimens  during the acute phase of infection.  Positive results are indicative  of the presence of the identified virus, but do not rule out bacterial infection or co-infection with other pathogens not detected by  the test.  Clinical correlation with patient history and  other diagnostic information is necessary to determine patient infection status.  The expected result is negative.  Fact Sheet for Patients:   BoilerBrush.com.cy   Fact Sheet for Healthcare Providers:   https://pope.com/    This test is not yet approved or cleared by the [place] FDA and  has been authorized for detection and/or diagnosis of SARS-CoV-2 by FDA under an Emergency Use Authorization (EUA).  This EUA will remain in effect (meaning this tes t can be used) for the duration of  the COVID-19 declaration under Section 564(b)(1) of the Act, 21 U.S.C. section 360-bbb-3(b)(1), unless the authorization is terminated or revoked sooner.  Performed at [facility], [address] [place], [place] [address]   Culture, blood (single)     Status: None   Collection Time: 01/12/21  1:08 PM   Specimen: BLOOD  Result Value Ref Range Status   Specimen Description BLOOD BLOOD RIGHT HAND  Final   Special Requests   Final    BOTTLES DRAWN AEROBIC AND ANAEROBIC Blood Culture adequate volume   Culture   Final    NO GROWTH 5 DAYS Performed at [facility], [address] [place], [place] [address]    Report Status 01/17/2021 FINAL  Final  MRSA PCR Screening     Status: None   Collection Time: 01/12/21  7:22 PM   Specimen: Nasopharyngeal  Result Value Ref Range Status   MRSA by PCR NEGATIVE NEGATIVE Final    Comment:        The GeneXpert MRSA Assay (FDA approved for NASAL specimens only), is one component of a comprehensive MRSA colonization surveillance program. It is not intended to diagnose MRSA infection nor to guide or monitor treatment for MRSA infections. Performed at [facility], [address] [place], [place] [address]   Culture, respiratory (non-expectorated)     Status: None   Collection Time: 01/13/21  3:04 PM   Specimen:  Tracheal Aspirate; Respiratory  Result Value Ref Range Status   Specimen Description   Final    TRACHEAL ASPIRATE Performed at [facility], [address] [place], [place] [address]    Special Requests   Final    NONE Performed at [facility], [address] [place], [place] [address]    Gram Stain   Final    ABUNDANT WBC PRESENT, PREDOMINANTLY PMN MODERATE GRAM POSITIVE COCCI Performed at [facility], [address] [place], [place] [address]    Culture FEW STAPHYLOCOCCUS AUREUS  Final   Report Status 01/16/2021 FINAL  Final  Organism ID, Bacteria STAPHYLOCOCCUS AUREUS  Final      Susceptibility   Staphylococcus aureus - MIC*    CIPROFLOXACIN <=0.5 SENSITIVE Sensitive     ERYTHROMYCIN <=0.25 SENSITIVE Sensitive     GENTAMICIN <=0.5 SENSITIVE Sensitive     OXACILLIN 0.5 SENSITIVE Sensitive     TETRACYCLINE <=1 SENSITIVE Sensitive     VANCOMYCIN 1 SENSITIVE Sensitive     TRIMETH/SULFA <=10 SENSITIVE Sensitive     CLINDAMYCIN <=0.25 SENSITIVE Sensitive     RIFAMPIN <=0.5 SENSITIVE Sensitive     Inducible Clindamycin NEGATIVE Sensitive     * FEW STAPHYLOCOCCUS AUREUS  Culture, respiratory (non-expectorated)     Status: None   Collection Time: 01/19/21 10:41 PM   Specimen: Tracheal Aspirate; Respiratory  Result Value Ref Range Status   Specimen Description   Final    TRACHEAL ASPIRATE Performed at [facility], [address] [place], [place] [address]    Special Requests   Final    NONE Performed at [facility], [address] [place], [place] [address]    Gram Stain   Final    FEW WBC PRESENT,BOTH PMN AND MONONUCLEAR RARE BUDDING YEAST SEEN Performed at [facility], [address] [place], [place] [address]    Culture FEW CANDIDA ALBICANS  Final   Report Status 01/22/2021 FINAL  Final    Coagulation Studies: No results for input(s): LABPROT, INR in the last 72 hours.  Urinalysis: No results for  input(s): COLORURINE, LABSPEC, PHURINE, GLUCOSEU, HGBUR, BILIRUBINUR, KETONESUR, PROTEINUR, UROBILINOGEN, NITRITE, LEUKOCYTESUR in the last 72 hours.  Invalid input(s): APPERANCEUR    Imaging: No results found.   Medications:   . sodium chloride    . dextrose    . erythromycin 250 mg (01/24/21 7106)  . famotidine (PEPCID) IV Stopped (01/23/21 1025)   . aspirin  81 mg Per Tube Daily  . atorvastatin  80 mg Per Tube Daily  . Chlorhexidine Gluconate Cloth  6 each Topical Daily  . docusate  100 mg Per Tube BID  . feeding supplement (VITAL 1.5 CAL)  1,000 mL Per Tube Q24H  . heparin injection (subcutaneous)  5,000 Units Subcutaneous Q8H  . insulin aspart  0-20 Units Subcutaneous Q4H  . insulin aspart  5 Units Subcutaneous Q4H  . insulin detemir  24 Units Subcutaneous QHS  . insulin detemir  44 Units Subcutaneous Daily  . mouth rinse  15 mL Mouth Rinse BID  . methylPREDNISolone (SOLU-MEDROL) injection  40 mg Intravenous Q24H  . metoCLOPramide  5 mg Per Tube Q8H  . metoprolol tartrate  12.5 mg Per Tube BID  . multivitamin  1 tablet Per Tube QHS  . polyethylene glycol  17 g Per Tube Daily  . sodium chloride flush  3 mL Intravenous Q12H   sodium chloride, acetaminophen (TYLENOL) oral liquid 160 mg/5 mL, dextrose, docusate sodium, hydrALAZINE, metoprolol tartrate, ondansetron **OR** ondansetron (ZOFRAN) IV, ondansetron (ZOFRAN) IV, polyethylene glycol, sodium chloride flush  Assessment/ Plan:  Erik [name] is a 30 y.o. black male with asthma, with no other past medical history, and no outpatient medications who was admitted to [facility] on 01/12/2021 for ARDS (adult respiratory distress syndrome) (HCC) [J80] Acute respiratory failure with hypoxia (HCC) [J96.01] Acute hypoxemic respiratory failure due to COVID-19 (HCC) [U07.1, J96.01] Hyperosmolar hyperglycemic state (HHS) (HCC) [E11.00, E11.65] Pneumonia due to COVID-19 virus [U07.1, J12.82]   Started on CRRT on admission. Discontinued  on 2/9. Hemodynamically stable. Has not required intermittent hemodialysis this  admission.   1. Acute kidney injury: with no known creatinine baseline. Urinalysis on admission with glycosuria, proteinuria, hemoglobinuria and ketones.  Nonoliguric urine output.  No indication for dialysis today. Continue to monitor for dialysis need.  If renal function continues to improve, will remove dialysis cathter.   2. Acute respiratory failure: requiring intubation and mechanical ventilation. Secondary to COVID-19 infection.  Extubation on 2/12.  Appreciate critical care input.   3. 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 Hemoglobin A1c of 13.6%.   4. Anemia with kidney failure: hemoglobin 9.2. normocytic.   5. Hypernatremia: free water deficit of 6.7 liters - Continue Dextrose infusion - encourage PO intake    LOS: 12 Desteny Freeman 2/13/20228:30 AM

## 2021-01-24 NOTE — Progress Notes (Signed)
Chart reviewed, Pt visited after discussion with Nsg. Provided oral care. Pt with coughing at times during visit, using yankar. Attempted one ice chip after Pt nodded he wanted to try, no noted swallow response. Pt also noted to have trembling lips at time. When asked if he was nauseated he nodded yes. Nsg provided Zofran earlier. Pt not ready for swallow eval at this time. Will hopefully assess as appropriate for PO's Monday. Extubated 2/12. Continue NPO, high risk of aspiration at this time.. Covid Positive, precautions taken.

## 2021-01-24 NOTE — Progress Notes (Signed)
PHARMACY CONSULT NOTE   Pharmacy Consult for Electrolyte Monitoring and Replacement   Recent Labs: Potassium (mmol/L)  Date Value  01/24/2021 4.6   Magnesium (mg/dL)  Date Value  10/62/6948 2.3   Calcium (mg/dL)  Date Value  54/62/7035 8.8 (L)   Albumin (g/dL)  Date Value  00/93/8182 2.3 (L)   Phosphorus (mg/dL)  Date Value  99/37/1696 6.1 (H)   Sodium (mmol/L)  Date Value  01/24/2021 148 (H)    Assessment: 30 year old male admitted with COVID-19 pneumonia and DKA. Started on an insulin drip. Patient with hypoxic respiratory failure requiring intubation 2/1. Patient hypotensive and requiring blood pressure support with Levophed. Patient remains intubated sedated and on mechanical ventilation in the ICU. Pharmacy to manage electrolytes.  Nephrology following. Previously on CRRT which was stopped 2/9. Following daily for HD needs.  Goal of Therapy:  Electrolytes WNL Per Cards (for VT): Maintain K>4.0, Mg>2.0.   Plan:  K 4.6  Mag 2.3  Phos 6.1  HD determined daily No electrolyte replacement at this time.  Tube feeds have been on hold for bowel rest. Continue to follow closely.  Novah Goza A, PharmD 01/24/2021 8:53 AM

## 2021-01-24 NOTE — Progress Notes (Signed)
OT Cancellation Note  Patient Details Name: CLEMENS LACHMAN MRN: [id number] DOB: 01-23-1991   Cancelled Treatment:    Reason Eval/Treat Not Completed: Other (comment). Orders received and chart reviewed. Pt able to participate in bed-level UE/LE exercises with PT this date. Based on current OT caseload, OT to triage and eval this pt tomorrow.   Matthew Folks, OTR/L [facility] [phone]

## 2021-01-24 NOTE — Progress Notes (Signed)
Pt received to 2A room 247 from ICU.  Pt oriented to room and call bell.  Pt whispers as he still has a hard time talking after extubation.  Pt does c/o pain in back and bottom 8/10 and NP notified.  Pt is alert and oriented x4, mae but extremely weak.  Skin warm and dry with excoriation in between buttocks.  Flexi-seal in place.  Foley in place.  IV infusing D10.  S1S2, lungs sounds diminished with crackles at bases.  Abd sounds active.  Pt is NPO and ST to re eval tomorrow.  Pedal pulses palpable.  ST 104.

## 2021-01-25 DIAGNOSIS — I248 Other forms of acute ischemic heart disease: Secondary | ICD-10-CM

## 2021-01-25 LAB — GLUCOSE, CAPILLARY
Glucose-Capillary: 130 mg/dL — ABNORMAL HIGH (ref 70–99)
Glucose-Capillary: 145 mg/dL — ABNORMAL HIGH (ref 70–99)
Glucose-Capillary: 239 mg/dL — ABNORMAL HIGH (ref 70–99)
Glucose-Capillary: 342 mg/dL — ABNORMAL HIGH (ref 70–99)
Glucose-Capillary: 401 mg/dL — ABNORMAL HIGH (ref 70–99)
Glucose-Capillary: 78 mg/dL (ref 70–99)
Glucose-Capillary: 90 mg/dL (ref 70–99)

## 2021-01-25 LAB — CBC WITH DIFFERENTIAL/PLATELET
Abs Immature Granulocytes: 0.14 10*3/uL — ABNORMAL HIGH (ref 0.00–0.07)
Basophils Absolute: 0 10*3/uL (ref 0.0–0.1)
Basophils Relative: 0 %
Eosinophils Absolute: 0 10*3/uL (ref 0.0–0.5)
Eosinophils Relative: 0 %
HCT: 29 % — ABNORMAL LOW (ref 39.0–52.0)
Hemoglobin: 8.7 g/dL — ABNORMAL LOW (ref 13.0–17.0)
Immature Granulocytes: 1 %
Lymphocytes Relative: 12 %
Lymphs Abs: 1.5 10*3/uL (ref 0.7–4.0)
MCH: 25.6 pg — ABNORMAL LOW (ref 26.0–34.0)
MCHC: 30 g/dL (ref 30.0–36.0)
MCV: 85.3 fL (ref 80.0–100.0)
Monocytes Absolute: 1.7 10*3/uL — ABNORMAL HIGH (ref 0.1–1.0)
Monocytes Relative: 14 %
Neutro Abs: 8.9 10*3/uL — ABNORMAL HIGH (ref 1.7–7.7)
Neutrophils Relative %: 73 %
Platelets: 340 10*3/uL (ref 150–400)
RBC: 3.4 MIL/uL — ABNORMAL LOW (ref 4.22–5.81)
RDW: 15.4 % (ref 11.5–15.5)
WBC: 12.2 10*3/uL — ABNORMAL HIGH (ref 4.0–10.5)
nRBC: 0 % (ref 0.0–0.2)

## 2021-01-25 LAB — RENAL FUNCTION PANEL
Albumin: 2.2 g/dL — ABNORMAL LOW (ref 3.5–5.0)
Anion gap: 9 (ref 5–15)
BUN: 59 mg/dL — ABNORMAL HIGH (ref 6–20)
CO2: 23 mmol/L (ref 22–32)
Calcium: 8.9 mg/dL (ref 8.9–10.3)
Chloride: 119 mmol/L — ABNORMAL HIGH (ref 98–111)
Creatinine, Ser: 2.74 mg/dL — ABNORMAL HIGH (ref 0.61–1.24)
GFR, Estimated: 31 mL/min — ABNORMAL LOW (ref >60)
Glucose, Bld: 90 mg/dL (ref 70–99)
Phosphorus: 4.8 mg/dL — ABNORMAL HIGH (ref 2.5–4.6)
Potassium: 4 mmol/L (ref 3.5–5.1)
Sodium: 151 mmol/L — ABNORMAL HIGH (ref 135–145)

## 2021-01-25 LAB — FERRITIN: Ferritin: 479 ng/mL — ABNORMAL HIGH (ref 24–336)

## 2021-01-25 LAB — FIBRIN DERIVATIVES D-DIMER (ARMC ONLY): Fibrin derivatives D-dimer (ARMC): 2138.72 ng/mL (FEU) — ABNORMAL HIGH (ref 0.00–499.00)

## 2021-01-25 LAB — C-REACTIVE PROTEIN: CRP: 2.8 mg/dL — ABNORMAL HIGH (ref <1.0)

## 2021-01-25 LAB — MAGNESIUM: Magnesium: 2.3 mg/dL (ref 1.7–2.4)

## 2021-01-25 MED ORDER — METHYLPREDNISOLONE SODIUM SUCC 40 MG IJ SOLR
40.0000 mg | Freq: Every day | INTRAMUSCULAR | Status: DC
  Administered 2021-01-25 – 2021-01-28 (×4): 40 mg via INTRAVENOUS
  Filled 2021-01-25 (×4): qty 1

## 2021-01-25 MED ORDER — ATORVASTATIN CALCIUM 20 MG PO TABS
80.0000 mg | ORAL_TABLET | Freq: Every day | ORAL | Status: DC
  Administered 2021-01-26 – 2021-02-03 (×9): 80 mg via ORAL
  Filled 2021-01-25 (×2): qty 4
  Filled 2021-01-25 (×2): qty 1
  Filled 2021-01-25 (×3): qty 4
  Filled 2021-01-25: qty 1
  Filled 2021-01-25: qty 4

## 2021-01-25 MED ORDER — METOPROLOL TARTRATE 25 MG PO TABS
12.5000 mg | ORAL_TABLET | Freq: Two times a day (BID) | ORAL | Status: DC
  Administered 2021-01-25 – 2021-01-27 (×4): 12.5 mg via ORAL
  Filled 2021-01-25 (×4): qty 1

## 2021-01-25 MED ORDER — ACETAMINOPHEN 160 MG/5ML PO SOLN
650.0000 mg | ORAL | Status: DC | PRN
  Filled 2021-01-25: qty 20.3

## 2021-01-25 MED ORDER — ASPIRIN 81 MG PO CHEW
81.0000 mg | CHEWABLE_TABLET | Freq: Every day | ORAL | Status: DC
  Administered 2021-01-26 – 2021-02-03 (×9): 81 mg via ORAL
  Filled 2021-01-25 (×9): qty 1

## 2021-01-25 MED ORDER — INSULIN DETEMIR 100 UNIT/ML ~~LOC~~ SOLN
0.5000 [IU]/kg | Freq: Every day | SUBCUTANEOUS | Status: DC
  Administered 2021-01-25 – 2021-01-26 (×2): 39 [IU] via SUBCUTANEOUS
  Filled 2021-01-25 (×3): qty 0.39

## 2021-01-25 MED ORDER — SODIUM CHLORIDE 0.45 % IV SOLN: INTRAVENOUS | Status: DC

## 2021-01-25 MED ORDER — RENA-VITE PO TABS
1.0000 | ORAL_TABLET | Freq: Every day | ORAL | Status: DC
  Administered 2021-01-25 – 2021-01-26 (×2): 1 via ORAL
  Filled 2021-01-25 (×3): qty 1

## 2021-01-25 MED ORDER — DOCUSATE SODIUM 100 MG PO CAPS
100.0000 mg | ORAL_CAPSULE | Freq: Two times a day (BID) | ORAL | Status: DC
  Administered 2021-01-25 – 2021-02-01 (×7): 100 mg via ORAL
  Filled 2021-01-25 (×13): qty 1

## 2021-01-25 MED ORDER — ENOXAPARIN SODIUM 100 MG/ML ~~LOC~~ SOLN
0.5000 mg/kg | SUBCUTANEOUS | Status: DC
  Administered 2021-01-25: 92.5 mg via SUBCUTANEOUS
  Filled 2021-01-25 (×2): qty 1

## 2021-01-25 MED ORDER — NEPRO/CARBSTEADY PO LIQD
237.0000 mL | Freq: Three times a day (TID) | ORAL | Status: DC
  Administered 2021-01-25 – 2021-01-31 (×13): 237 mL via ORAL

## 2021-01-25 MED ORDER — ADULT MULTIVITAMIN W/MINERALS CH
1.0000 | ORAL_TABLET | Freq: Every day | ORAL | Status: DC
  Administered 2021-01-26 – 2021-02-03 (×9): 1 via ORAL
  Filled 2021-01-25 (×9): qty 1

## 2021-01-25 MED ORDER — POLYETHYLENE GLYCOL 3350 17 G PO PACK
17.0000 g | PACK | Freq: Every day | ORAL | Status: DC
  Administered 2021-01-26 – 2021-01-29 (×2): 17 g via ORAL
  Filled 2021-01-25 (×2): qty 1

## 2021-01-25 NOTE — Progress Notes (Signed)
PROGRESS NOTE    Erik [name]  PTW:[id number] DOB: 01-Dec-1991 DOA: 01/12/2021 PCP: Pcp, No   Brief Narrative:  30 yo morbidly obese male with ASTHMA now with progressive multiorgan failure with COVID 19 pneumonia and ARDS with severe acidosis and severe hypoxia with ARDS with acute renal failure; now extubated and off renal replacement therapy.   Summary of events:  2/1 Emergently intubated 2/2 severe resp failure, vasc cath placed 2/3 severe resp failure 01/15/2021-patient is critically Ill, CRRT continued. Borderline low blood glucose today.  01/16/2021-patient with thickened heavy ETT secretions. RN reporting inadequate sedation with patient making motions to remove airway, we have advanced RASS to range -3 to -4. Remains critically ill with ongoing renal replacement therapy and COVID treatment.  01/17/2021-patient had thickened secretions again today, he was lavaged with saline by RT today with good response. We will try awakening trial again in AM.  01/18/2021-remains on vent, on CRRT 01/19/2021-acute MI 2/9 s/p left heart cath, multiorgan failure+vomiting 2/10 failed wean trial 2/11 - successfully extubated  Assessment & Plan:   Principal Problem:   Acute hypoxemic respiratory failure due to COVID-19 [facility]) Active Problems:   DKA (diabetic ketoacidosis) (HCC)   Obesity, Class III, BMI 40-49.9 (morbid obesity) (HCC)   Acute metabolic encephalopathy   ARDS (adult respiratory distress syndrome) (HCC)   ST elevation myocardial infarction (STEMI) (HCC)   Acute respiratory failure with hypoxia (HCC)   Acute hypoxemic respiratory failure due to severe COVID-19 pneumonia/ARDS Rquire Mechanical ventilation  Successfully extubated 01/22/21 -currently on room air at rest Completed remdesivir Due to suspected CAP no baricitinib/actemra Completed antibiotics Continue steroid taper Labs downtrending appropriately Recent Labs    01/23/21 0517 01/24/21 0615 01/25/21 0515   FERRITIN 642* 597* 479*  CRP 8.9* 5.3*  --     AKI with acute renal failure, resolving - Foley catheter(and rectal tube) ongoing - will remove when patient more mobile - I/O remains negative off renal replacement - appreciate nephrology insight/recs Lab Results  Component Value Date   CREATININE 2.74 (H) 01/25/2021   CREATININE 3.15 (H) 01/24/2021   CREATININE 3.44 (H) 01/23/2021    Acute inferior STEMI with associated diastolic heart failure and Vtach -Cardiology following - appreciate insight/recs -No recurrent episodes of VT reported -Remains on room air at rest -Lasix previously discontinued in the setting of renal failure - I/O negative  Intake/Output Summary (Last 24 hours) at 01/25/2021 0748 Last data filed at 01/25/2021 0400 Gross per 24 hour  Intake 1014.77 ml  Output 1550 ml  Net -535.23 ml   Morbid obesity, high risk for OSA (undiagnosed).  Will certainly impact respiratory mechanics, ventilator weaning Suspect will need to consider additional PEEP  Profound hyperglycemia secondary to newly diagnosed uncontrolled diabetes - insulin dependent Lab Results  Component Value Date   HGBA1C 13.6 (H) 01/12/2021  Continue sliding scale insulin Increase levemir as indicated - currently on 24u HS Hypoglycemic protocol as needed   DVT prophylaxis: heparin subQ Code Status: Full Family Communication: Updated over the phone  Status is: Inpt  Dispo: The patient is from: Home              Anticipated d/c is to: TBD              Anticipated d/c date is: >27h               Patient currently NOT medically stable for discharge  Consultants:   ICU, Nephrology  Antimicrobials:  Completed azithromycin/ceftriaxone 5 day course 01/16/21  Doxycycline stopped 01/18/21 Completed Remdesivir 01/16/21  Erythromycin 2/10 -discontinued 2/14  Subjective: No acute issues/events overnight -patient much more awake alert oriented this morning, much more interactive, continues to be  somewhat hoarse but able to communicate appropriately denies chest pain nausea vomiting diarrhea constipation headache fevers or chills.  Objective: Vitals:   01/24/21 1900 01/24/21 2046 01/25/21 0412 01/25/21 0511  BP: (!) 152/86 (!) 150/82 (!) 143/85 120/69  Pulse: (!) 108 (!) 111 (!) 108 (!) 104  Resp: (!) 24 19 20 18   Temp: 98.96 F (37.2 C) 99.8 F (37.7 C) 98.9 F (37.2 C) 97.7 F (36.5 C)  TempSrc:  Oral Oral Oral  SpO2: 98% 91% 92% 91%  Weight:   (!) 186.9 kg   Height:        Intake/Output Summary (Last 24 hours) at 01/25/2021 0748 Last data filed at 01/25/2021 0400 Gross per 24 hour  Intake 1014.77 ml  Output 1550 ml  Net -535.23 ml   Filed Weights   01/22/21 0500 01/23/21 0500 01/25/21 0412  Weight: (!) 194.6 kg (!) 195.5 kg (!) 186.9 kg    Examination:  General exam: Appears calm and comfortable, no acute distress Respiratory system: Bilateral ronchi/coarse breath sounds. Cardiovascular system: RRR Gastrointestinal system: Abdomen is obese, soft and nontender. No organomegaly or masses felt. Normal bowel sounds heard. Central nervous system: No focal neurological deficits. Extremities: Symmetric 5 x 5 power. Skin: No rashes, lesions  Data Reviewed: I have personally reviewed following labs and imaging studies  CBC: Recent Labs  Lab 01/21/21 0502 01/22/21 0526 01/23/21 0517 01/24/21 0615 01/25/21 0515  WBC 17.1* 13.7* 10.9* 13.2* 12.2*  NEUTROABS 9.6* 8.4* 7.6 10.0* 8.9*  HGB 8.9* 8.0* 8.3* 9.2* 8.7*  HCT 28.0* 25.8* 27.0* 30.2* 29.0*  MCV 82.1 83.5 83.3 83.7 85.3  PLT 197 239 292 371 340   Basic Metabolic Panel: Recent Labs  Lab 01/21/21 0502 01/22/21 0526 01/23/21 0517 01/24/21 0615 01/25/21 0515  NA 139 139 142 148* 151*  K 3.3* 3.9 5.0 4.6 4.0  CL 107 107 109 114* 119*  CO2 20* 21* 21* 20* 23  GLUCOSE 141* 128* 216* 159* 90  BUN 62* 51* 62* 67* 59*  CREATININE 4.06* 3.39* 3.44* 3.15* 2.74*  CALCIUM 8.4* 8.3* 8.8* 8.8* 8.9  MG 2.2  2.2 2.4 2.3 2.3  PHOS 6.0* 5.7* 7.0* 6.1* 4.8*   GFR: Estimated Creatinine Clearance: 68.2 mL/min (A) (by C-G formula based on SCr of 2.74 mg/dL (H)). Liver Function Tests: Recent Labs  Lab 01/21/21 0502 01/22/21 0526 01/23/21 0517 01/24/21 0615 01/25/21 0515  ALBUMIN 2.0* 2.1* 1.9* 2.3* 2.2*   No results for input(s): LIPASE, AMYLASE in the last 168 hours. No results for input(s): AMMONIA in the last 168 hours. Coagulation Profile: No results for input(s): INR, PROTIME in the last 168 hours. Cardiac Enzymes: No results for input(s): CKTOTAL, CKMB, CKMBINDEX, TROPONINI in the last 168 hours. BNP (last 3 results) No results for input(s): PROBNP in the last 8760 hours. HbA1C: No results for input(s): HGBA1C in the last 72 hours. CBG: Recent Labs  Lab 01/24/21 1056 01/24/21 1540 01/24/21 1933 01/25/21 0018 01/25/21 0407  GLUCAP 132* 190* 225* 145* 78   Lipid Profile: No results for input(s): CHOL, HDL, LDLCALC, TRIG, CHOLHDL, LDLDIRECT in the last 72 hours. Thyroid Function Tests: No results for input(s): TSH, T4TOTAL, FREET4, T3FREE, THYROIDAB in the last 72 hours. Anemia Panel: Recent Labs    01/24/21 0615 01/25/21 0515  FERRITIN 597* 479*  Sepsis Labs: No results for input(s): PROCALCITON, LATICACIDVEN in the last 168 hours.  Recent Results (from the past 240 hour(s))  Culture, respiratory (non-expectorated)     Status: None   Collection Time: 01/19/21 10:41 PM   Specimen: Tracheal Aspirate; Respiratory  Result Value Ref Range Status   Specimen Description   Final    TRACHEAL ASPIRATE Performed at [facility], [address] [place], [place] [address]    Special Requests   Final    NONE Performed at [facility], [address] [place], [place] [address]    Gram Stain   Final    FEW WBC PRESENT,BOTH PMN AND MONONUCLEAR RARE BUDDING YEAST SEEN Performed at [facility], [address] [place], [place] [address]     Culture FEW CANDIDA ALBICANS  Final   Report Status 01/22/2021 FINAL  Final     Radiology Studies: No results found.  Scheduled Meds: . aspirin  81 mg Per Tube Daily  . atorvastatin  80 mg Per Tube Daily  . Chlorhexidine Gluconate Cloth  6 each Topical Daily  . docusate  100 mg Per Tube BID  . heparin injection (subcutaneous)  5,000 Units Subcutaneous Q8H  . insulin aspart  0-20 Units Subcutaneous Q4H  . insulin aspart  5 Units Subcutaneous Q4H  . insulin detemir  24 Units Subcutaneous QHS  . insulin detemir  44 Units Subcutaneous Daily  . mouth rinse  15 mL Mouth Rinse BID  . methylPREDNISolone (SOLU-MEDROL) injection  40 mg Intravenous Daily  . metoCLOPramide  5 mg Per Tube Q8H  . metoprolol tartrate  12.5 mg Per Tube BID  . multivitamin  1 tablet Per Tube QHS  . polyethylene glycol  17 g Per Tube Daily  . sodium chloride flush  3 mL Intravenous Q12H   Continuous Infusions: . sodium chloride    . dextrose 40 mL/hr at 01/24/21 1006  . erythromycin 250 mg (01/25/21 0525)  . famotidine (PEPCID) IV Stopped (01/24/21 1033)     LOS: 13 days   Time spent:  Azucena Fallen, DO [facility]  If 7PM-7AM, please contact night-coverage [email or web address]  01/25/2021, 7:48 AM

## 2021-01-25 NOTE — Evaluation (Signed)
Occupational Therapy Evaluation Patient Details Name: Erik [name] MRN: [id number] DOB: Apr 29, 1991 Today's Date: 01/25/2021    History of Present Illness 30 year old morbidly obese male with history of asthma presented to the emergency room on 01/13/2020 with severe acute hypoxic respiratory failure/ARDS and severe metabolic acidosis associated with acute renal failure in the setting of COVID-19 pneumonia.  Patient was emergently intubated on 01/13/2020. Hospital course complicated by renal failure requiring CRRT.  Also had acute MI on 01/19/2021.  Failed weaning trials up until 01/22/2020.  Was successfully extubated 01/23/21.   Clinical Impression   Pt seen for OT evaluation this date. Pt was agreeable to evaluation and demonstrate active participation throughout session. Prior to admission, pt was independent in all ADL and functional mobility, living in a multi-level home with parent. Pt currently requires MAX A+2 for bed mobility, MOD A for seated bimanual grooming tasks, and intermittent MIN A for dynamic sitting balance. Pt would benefit from continued skilled OT services to maximize return to PLOF and improve strength, balance, and activity tolerance. Upon discharge, recommend CIR.      Follow Up Recommendations  CIR    Equipment Recommendations  Other (comment) (defer to next venue of care)       Precautions / Restrictions Precautions Precautions: Fall Restrictions Weight Bearing Restrictions: No Other Position/Activity Restrictions: Temp HD IJ cath      Mobility Bed Mobility Overal bed mobility: Needs Assistance Bed Mobility: Supine to Sit;Sit to Supine     Supine to sit: Max assist;+2 for physical assistance Sit to supine: Max assist;+2 for physical assistance   General bed mobility comments: pt put forth good effort to participate in bed mobility.    Transfers                 General transfer comment: Unsafe/unable this session    Balance Overall balance  assessment: Needs assistance Sitting-balance support: No upper extremity supported;Feet supported Sitting balance-Leahy Scale: Poor Sitting balance - Comments: Pt able to sit at EOB to engage in grooming and UB therex, requiring intermittent MIN A for balance d/t posterior and lateral lean. pt able to sit EOB this session for ~70min Postural control: Posterior lean;Right lateral lean                                 ADL either performed or assessed with clinical judgement   ADL Overall ADL's : Needs assistance/impaired     Grooming: Wash/dry face;Set up;Bed level;Applying deodorant;Moderate assistance;Sitting Grooming Details (indicate cue type and reason): Increased assistance required for bimanual tasks d/t LUE weakness; pt able to use LUE as functional assist when taking lid off deodorant, required MOD A to abduct L UE to apply deodorant             Lower Body Dressing: Maximal assistance;Bed level Lower Body Dressing Details (indicate cue type and reason): To don/doff socks                                  Pertinent Vitals/Pain Pain Assessment: No/denies pain     Hand Dominance Right   Extremity/Trunk Assessment Upper Extremity Assessment Upper Extremity Assessment: Generalized weakness;RUE deficits/detail;LUE deficits/detail RUE Deficits / Details: R UE grossly 3-/5 LUE Deficits / Details: L UE grossly 2+/5;   Lower Extremity Assessment Lower Extremity Assessment: Defer to PT evaluation       Communication  Communication Communication:  (very low phonation, poor voice quality)   Cognition Arousal/Alertness: Awake/alert Behavior During Therapy: WFL for tasks assessed/performed Overall Cognitive Status: Within Functional Limits for tasks assessed                                 General Comments: Pt able to follow commands and demonstrate active participation throughout session   General Comments               Home  Living Family/patient expects to be discharged to:: Private residence Living Arrangements: Parent Available Help at Discharge: Family Type of Home: House       Home Layout: Multi-level;Bed/bath upstairs               Home Equipment: None          Prior Functioning/Environment Level of Independence: Independent        Comments: Indep with ADLs, household and community mobilization without assist device; denies fall history; no home O2; enjoys video games.        OT Problem List: Decreased strength;Decreased activity tolerance;Impaired balance (sitting and/or standing);Obesity;Decreased range of motion      OT Treatment/Interventions: Self-care/ADL training;Therapeutic exercise;Energy conservation;DME and/or AE instruction;Therapeutic activities;Patient/family education;Balance training    OT Goals(Current goals can be found in the care plan section) Acute Rehab OT Goals Patient Stated Goal: to get stronger OT Goal Formulation: With patient Time For Goal Achievement: 02/08/21 Potential to Achieve Goals: Good ADL Goals Pt Will Perform Grooming: with min guard assist;sitting Pt Will Perform Upper Body Dressing: with min guard assist;sitting Pt Will Transfer to Toilet: with min guard assist;stand pivot transfer;bedside commode  OT Frequency: Min 2X/week   Barriers to D/C:            Co-evaluation PT/OT/SLP Co-Evaluation/Treatment: Yes Reason for Co-Treatment: For patient/therapist safety;To address functional/ADL transfers PT goals addressed during session: Mobility/safety with mobility;Balance OT goals addressed during session: ADL's and self-care      AM-PAC OT "6 Clicks" Daily Activity     Outcome Measure Help from another person eating meals?: A Little Help from another person taking care of personal grooming?: A Lot Help from another person toileting, which includes using toliet, bedpan, or urinal?: Total Help from another person bathing (including  washing, rinsing, drying)?: A Lot Help from another person to put on and taking off regular upper body clothing?: A Lot Help from another person to put on and taking off regular lower body clothing?: A Lot 6 Click Score: 12   End of Session Nurse Communication: Mobility status  Activity Tolerance: Patient tolerated treatment well Patient left: in bed;with call bell/phone within reach;with bed alarm set  OT Visit Diagnosis: Unsteadiness on feet (R26.81);Muscle weakness (generalized) (M62.81)                Time: 3007-6226 OT Time Calculation (min): 37 min Charges:  OT General Charges $OT Visit: 1 Visit OT Evaluation $OT Eval Moderate Complexity: 1 Mod  Matthew Folks, OTR/L [facility] [phone]

## 2021-01-25 NOTE — Progress Notes (Signed)
Progress Note  Patient Name: Erik [name] Date of Encounter: 01/25/2021  [facility] Cardiologist: Dr. Okey Dupre  Subjective   Pt is COVID positive. Md to see  No further VT noted, tele shows ST with rates 100-120.  Inpatient Medications    Scheduled Meds: . aspirin  81 mg Per Tube Daily  . atorvastatin  80 mg Per Tube Daily  . Chlorhexidine Gluconate Cloth  6 each Topical Daily  . docusate  100 mg Per Tube BID  . heparin injection (subcutaneous)  5,000 Units Subcutaneous Q8H  . insulin aspart  0-20 Units Subcutaneous Q4H  . insulin aspart  5 Units Subcutaneous Q4H  . insulin detemir  24 Units Subcutaneous QHS  . insulin detemir  44 Units Subcutaneous Daily  . mouth rinse  15 mL Mouth Rinse BID  . methylPREDNISolone (SOLU-MEDROL) injection  40 mg Intravenous Daily  . metoCLOPramide  5 mg Per Tube Q8H  . metoprolol tartrate  12.5 mg Per Tube BID  . multivitamin  1 tablet Per Tube QHS  . polyethylene glycol  17 g Per Tube Daily  . sodium chloride flush  3 mL Intravenous Q12H   Continuous Infusions: . sodium chloride    . dextrose 40 mL/hr at 01/24/21 1006  . erythromycin 250 mg (01/25/21 0525)  . famotidine (PEPCID) IV Stopped (01/24/21 1033)   PRN Meds: sodium chloride, acetaminophen (TYLENOL) oral liquid 160 mg/5 mL, dextrose, docusate sodium, hydrALAZINE, metoprolol tartrate, ondansetron **OR** ondansetron (ZOFRAN) IV, ondansetron (ZOFRAN) IV, polyethylene glycol, sodium chloride flush   Vital Signs    Vitals:   01/24/21 1900 01/24/21 2046 01/25/21 0412 01/25/21 0511  BP: (!) 152/86 (!) 150/82 (!) 143/85 120/69  Pulse: (!) 108 (!) 111 (!) 108 (!) 104  Resp: (!) 24 19 20 18   Temp: 98.96 F (37.2 C) 99.8 F (37.7 C) 98.9 F (37.2 C) 97.7 F (36.5 C)  TempSrc:  Oral Oral Oral  SpO2: 98% 91% 92% 91%  Weight:   (!) 186.9 kg   Height:        Intake/Output Summary (Last 24 hours) at 01/25/2021 0837 Last data filed at 01/25/2021 0400 Gross per 24 hour  Intake  1001.71 ml  Output 1200 ml  Net -198.29 ml   Last 3 Weights 01/25/2021 01/23/2021 01/22/2021  Weight (lbs) 412 lb 431 lb 429 lb  Weight (kg) 186.882 kg 195.5 kg 194.593 kg      Telemetry    ST HR 100-120, no further VT noted - Personally Reviewed  ECG    No new - Personally Reviewed  Physical Exam    Given COVID status please seem MD attestation  Labs    High Sensitivity Troponin:   Recent Labs  Lab 01/20/21 0518 01/20/21 1003 01/20/21 1620 01/20/21 2350 01/21/21 0901  TROPONINIHS 47* 4,189* 6,440* 6,816* 4,519*      Chemistry Recent Labs  Lab 01/23/21 0517 01/24/21 0615 01/25/21 0515  NA 142 148* 151*  K 5.0 4.6 4.0  CL 109 114* 119*  CO2 21* 20* 23  GLUCOSE 216* 159* 90  BUN 62* 67* 59*  CREATININE 3.44* 3.15* 2.74*  CALCIUM 8.8* 8.8* 8.9  ALBUMIN 1.9* 2.3* 2.2*  GFRNONAA 24* 26* 31*  ANIONGAP 12 14 9      Hematology Recent Labs  Lab 01/23/21 0517 01/24/21 0615 01/25/21 0515  WBC 10.9* 13.2* 12.2*  RBC 3.24* 3.61* 3.40*  HGB 8.3* 9.2* 8.7*  HCT 27.0* 30.2* 29.0*  MCV 83.3 83.7 85.3  MCH 25.6* 25.5* 25.6*  MCHC 30.7 30.5 30.0  RDW 14.7 15.2 15.4  PLT 292 371 340    BNPNo results for input(s): BNP, PROBNP in the last 168 hours.   DDimer No results for input(s): DDIMER in the last 168 hours.   Radiology    No results found.  Cardiac Studies   LHC 01/20/2021: Conclusions: 1. Minimal coronary artery plaquing without angiographically significant coronary artery disease. 2. Moderately reduced left ventricular contraction with global hypokinesis. 3. Mildly elevated left ventricular filling pressure (LVEDP ~20 mmHg).  Recommendations: 1. No obvious coronary artery lesion to explain ST segment changes and sustained ventricular tachycardia. Possible causes include metabolic/electrolyte derangements and myocarditis in the setting of COVID-19. 2. Trend HS-TnI until it has peaked, then stop. 3. Obtain transthoracic echocardiogram. 4. If blood  pressure tolerates, consider addition of low-dose beta-blocker. 5. If patient has recurrent VT, consider IV amiodarone. 6. Replete electrolytes to maintain K > 4.0 and Mg > 2.0. __________  2D echo 01/20/2021: 1. Left ventricular ejection fraction, by estimation, is 60 to 65%. The  left ventricle has normal function. The left ventricle has no regional  wall motion abnormalities. Left ventricular diastolic parameters were  normal.  2. Right ventricular systolic function is normal. The right ventricular  size is normal.  3. The mitral valve is normal in structure. No evidence of mitral valve  regurgitation. No evidence of mitral stenosis.  4. The aortic valve is normal in structure. Aortic valve regurgitation is  not visualized. No aortic stenosis is present.  5. The inferior vena cava is normal in size with greater than 50%  respiratory variability, suggesting right atrial pressure of 3 mmHg.  Patient Profile     30 y.o. male with history of asthma and morbid obesity admitted with COVID PNA complicated by Inferior STEMI, AKI, and VT.   Assessment & Plan    Inferior STEMI - HS troponin peaked at 6,816 - cath showed no significant CAD - Echo showed normal LV and no obvious WMA - Question thrombus with spontaneous lysis, tiny branch occlusion, or myocarditis. Not felt to be a good candidate for cMRI given body habitus - Patient completed IV heparin x 48 hours - continue Aspirin and BB  VT - No recoccurence noted - Lopressor 12.5mg  BID - Goal K>4 and Mag >2  AKI - Creatinine 3.06 on admission - Today 2.74 - nephrology following  Acute hypoxic respiratory failure COVID-19 PNA ARDS previously requiring ventilation - extubated 2/12 - completed remdesivir - completed abx - per IM  For questions or updates, please contact [facility] Please consult [email or web address] for contact info under        Signed, Mariadelosang Wynns David Stall, PA-C  01/25/2021, 8:37 AM

## 2021-01-25 NOTE — Progress Notes (Signed)
[facility]  ROUNDING NOTE   Subjective:   Erik CommonsJustin [name] was seen today sitting at the side of bed with physical thera[place]py. He is alert and able to answer simple questions. Denies shortness and breath and chest pain.  Currently NPO, but unsure why  Objective:  Vital signs in last 24 hours:  Temp:  [97.7 F (36.5 C)-99.8 F (37.7 C)] 98.8 F (37.1 C) (02/14 0929) Pulse Rate:  [103-111] 106 (02/14 1216) Resp:  [18-28] 20 (02/14 1216) BP: (120-160)/(69-94) 158/91 (02/14 1216) SpO2:  [91 %-98 %] 93 % (02/14 1216) Weight:  [186.9 kg] 186.9 kg (02/14 0412)  Weight change:  Filed Weights   01/22/21 0500 01/23/21 0500 01/25/21 0412  Weight: (!) 194.6 kg (!) 195.5 kg (!) 186.9 kg    Intake/Output: I/O last 3 completed shifts: In: 1295.2 [I.V.:738.5; IV Piggyback:556.7] Out: 2725 [Urine:2575; Stool:150]   Intake/Output this shift:  No intake/output data recorded.  Physical Exam: General: NAD  Head: Normocephalic, oral mucosa dry  Eyes: Anicteric  Neck:  trachea midline  Lungs:  Diminished bilaterally  Heart: tachycardia  Abdomen:  Soft, obese  Extremities:  no peripheral edema.  Neurologic: Alert, oriented, able to answer questions  Skin: No lesions  Access: LIJ permcath    Basic Metabolic Panel: Recent Labs  Lab 01/21/21 0502 01/22/21 0526 01/23/21 0517 01/24/21 0615 01/25/21 0515  NA 139 139 142 148* 151*  K 3.3* 3.9 5.0 4.6 4.0  CL 107 107 109 114* 119*  CO2 20* 21* 21* 20* 23  GLUCOSE 141* 128* 216* 159* 90  BUN 62* 51* 62* 67* 59*  CREATININE 4.06* 3.39* 3.44* 3.15* 2.74*  CALCIUM 8.4* 8.3* 8.8* 8.8* 8.9  MG 2.2 2.2 2.4 2.3 2.3  PHOS 6.0* 5.7* 7.0* 6.1* 4.8*    Liver Function Tests: Recent Labs  Lab 01/21/21 0502 01/22/21 0526 01/23/21 0517 01/24/21 0615 01/25/21 0515  ALBUMIN 2.0* 2.1* 1.9* 2.3* 2.2*   No results for input(s): LIPASE, AMYLASE in the last 168 hours. No results for input(s): AMMONIA in the last 168  hours.  CBC: Recent Labs  Lab 01/21/21 0502 01/22/21 0526 01/23/21 0517 01/24/21 0615 01/25/21 0515  WBC 17.1* 13.7* 10.9* 13.2* 12.2*  NEUTROABS 9.6* 8.4* 7.6 10.0* 8.9*  HGB 8.9* 8.0* 8.3* 9.2* 8.7*  HCT 28.0* 25.8* 27.0* 30.2* 29.0*  MCV 82.1 83.5 83.3 83.7 85.3  PLT 197 239 292 371 340    Cardiac Enzymes: No results for input(s): CKTOTAL, CKMB, CKMBINDEX, TROPONINI in the last 168 hours.  BNP: Invalid input(s): POCBNP  CBG: Recent Labs  Lab 01/24/21 1540 01/24/21 1933 01/25/21 0018 01/25/21 0407 01/25/21 0954  GLUCAP 190* 225* 145* 78 90    Microbiology: Results for orders placed or performed during the hospital encounter of 01/12/21  Blood culture (routine single)     Status: None   Collection Time: 01/12/21  9:05 AM   Specimen: BLOOD  Result Value Ref Range Status   Specimen Description BLOOD LEFT AC  Final   Special Requests   Final    BOTTLES DRAWN AEROBIC ONLY Blood Culture results may not be optimal due to an inadequate volume of blood received in culture bottles   Culture   Final    NO GROWTH 5 DAYS Performed at [facility], [address] [place], [place] [address]    Report Status 01/17/2021 FINAL  Final  SARS Coronavirus 2 by RT PCR (hospital order, performed in [facility] lab) Nasopharyngeal Nasopharyngeal Swab  Status: Abnormal   Collection Time: 01/12/21  9:36 AM   Specimen: Nasopharyngeal Swab  Result Value Ref Range Status   SARS Coronavirus 2 POSITIVE (A) NEGATIVE Final    Comment: RESULT CALLED TO, READ BACK BY AND VERIFIED WITH: ANNA JASPER ON 01/12/21 AT 1111 SDR (NOTE) SARS-CoV-2 target nucleic acids are DETECTED  SARS-CoV-2 RNA is generally detectable in upper respiratory specimens  during the acute phase of infection.  Positive results are indicative  of the presence of the identified virus, but do not rule out bacterial infection or co-infection with other pathogens not detected by the test.  Clinical  correlation with patient history and  other diagnostic information is necessary to determine patient infection status.  The expected result is negative.  Fact Sheet for Patients:   BoilerBrush.com.cy   Fact Sheet for Healthcare Providers:   https://pope.com/    This test is not yet approved or cleared by the [place] FDA and  has been authorized for detection and/or diagnosis of SARS-CoV-2 by FDA under an Emergency Use Authorization (EUA).  This EUA will remain in effect (meaning this tes t can be used) for the duration of  the COVID-19 declaration under Section 564(b)(1) of the Act, 21 U.S.C. section 360-bbb-3(b)(1), unless the authorization is terminated or revoked sooner.  Performed at [facility], [address] [place], [place] [address]   Culture, blood (single)     Status: None   Collection Time: 01/12/21  1:08 PM   Specimen: BLOOD  Result Value Ref Range Status   Specimen Description BLOOD BLOOD RIGHT HAND  Final   Special Requests   Final    BOTTLES DRAWN AEROBIC AND ANAEROBIC Blood Culture adequate volume   Culture   Final    NO GROWTH 5 DAYS Performed at [facility], [address] [place], [place] [address]    Report Status 01/17/2021 FINAL  Final  MRSA PCR Screening     Status: None   Collection Time: 01/12/21  7:22 PM   Specimen: Nasopharyngeal  Result Value Ref Range Status   MRSA by PCR NEGATIVE NEGATIVE Final    Comment:        The GeneXpert MRSA Assay (FDA approved for NASAL specimens only), is one component of a comprehensive MRSA colonization surveillance program. It is not intended to diagnose MRSA infection nor to guide or monitor treatment for MRSA infections. Performed at [facility], [address] [place], [place] [address]   Culture, respiratory (non-expectorated)     Status: None   Collection Time: 01/13/21  3:04 PM   Specimen: Tracheal Aspirate;  Respiratory  Result Value Ref Range Status   Specimen Description   Final    TRACHEAL ASPIRATE Performed at [facility], [address] [place], [place] [address]    Special Requests   Final    NONE Performed at [facility], [address] [place], [place] [address]    Gram Stain   Final    ABUNDANT WBC PRESENT, PREDOMINANTLY PMN MODERATE GRAM POSITIVE COCCI Performed at [facility], [name] [place], [place] [address]    Culture FEW STAPHYLOCOCCUS AUREUS  Final   Report Status 01/16/2021 FINAL  Final   Organism ID, Bacteria STAPHYLOCOCCUS AUREUS  Final      Susceptibility   Staphylococcus aureus - MIC*    CIPROFLOXACIN <=0.5 SENSITIVE Sensitive     ERYTHROMYCIN <=0.25 SENSITIVE Sensitive     GENTAMICIN <=0.5 SENSITIVE Sensitive  OXACILLIN 0.5 SENSITIVE Sensitive     TETRACYCLINE <=1 SENSITIVE Sensitive     VANCOMYCIN 1 SENSITIVE Sensitive     TRIMETH/SULFA <=10 SENSITIVE Sensitive     CLINDAMYCIN <=0.25 SENSITIVE Sensitive     RIFAMPIN <=0.5 SENSITIVE Sensitive     Inducible Clindamycin NEGATIVE Sensitive     * FEW STAPHYLOCOCCUS AUREUS  Culture, respiratory (non-expectorated)     Status: None   Collection Time: 01/19/21 10:41 PM   Specimen: Tracheal Aspirate; Respiratory  Result Value Ref Range Status   Specimen Description   Final    TRACHEAL ASPIRATE Performed at [facility], [address] [place], [place] [address]    Special Requests   Final    NONE Performed at [facility], [address] [place], [place] [address]    Gram Stain   Final    FEW WBC PRESENT,BOTH PMN AND MONONUCLEAR RARE BUDDING YEAST SEEN Performed at [facility], [address], [place], [place] [address]    Culture FEW CANDIDA ALBICANS  Final   Report Status 01/22/2021 FINAL  Final    Coagulation Studies: No results for input(s): LABPROT, INR in the last 72 hours.  Urinalysis: No results for input(s): COLORURINE,  LABSPEC, PHURINE, GLUCOSEU, HGBUR, BILIRUBINUR, KETONESUR, PROTEINUR, UROBILINOGEN, NITRITE, LEUKOCYTESUR in the last 72 hours.  Invalid input(s): APPERANCEUR    Imaging: No results found.   Medications:   . sodium chloride    . sodium chloride    . famotidine (PEPCID) IV 20 mg (01/25/21 1022)   . aspirin  81 mg Oral Daily  . atorvastatin  80 mg Oral Daily  . Chlorhexidine Gluconate Cloth  6 each Topical Daily  . docusate sodium  100 mg Oral BID  . enoxaparin (LOVENOX) injection  0.5 mg/kg Subcutaneous Q24H  . feeding supplement (NEPRO CARB STEADY)  237 mL Oral TID BM  . insulin aspart  0-20 Units Subcutaneous Q4H  . mouth rinse  15 mL Mouth Rinse BID  . methylPREDNISolone (SOLU-MEDROL) injection  40 mg Intravenous Daily  . metoprolol tartrate  12.5 mg Oral BID  . multivitamin  1 tablet Oral QHS  . [START ON 01/26/2021] multivitamin with minerals  1 tablet Oral Daily  . polyethylene glycol  17 g Oral Daily  . sodium chloride flush  3 mL Intravenous Q12H   sodium chloride, acetaminophen (TYLENOL) oral liquid 160 mg/5 mL, dextrose, hydrALAZINE, metoprolol tartrate, ondansetron **OR** ondansetron (ZOFRAN) IV, ondansetron (ZOFRAN) IV, polyethylene glycol, sodium chloride flush  Assessment/ Plan:  Mr. FAHD GALEA is a 30 y.o. black male with asthma, with no other past medical history, and no outpatient medications who was admitted to [facility] on 01/12/2021 for ARDS (adult respiratory distress syndrome) (HCC) [J80] Acute respiratory failure with hypoxia (HCC) [J96.01] Acute hypoxemic respiratory failure due to COVID-19 (HCC) [U07.1, J96.01] Hyperosmolar hyperglycemic state (HHS) (HCC) [E11.00, E11.65] Pneumonia due to COVID-19 virus [U07.1, J12.82]   Started on CRRT on admission. Discontinued on 2/9. Hemodynamically stable. Has not required intermittent hemodialysis this admission.   1. Acute kidney injury: with no known creatinine baseline.  UOP 1.5L in last 24 hours. No acute need  for dialysis Will continue to monitor  2. Acute respiratory failure:  Currently on room air Denies SOB  3. 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 Hemoglobin A1c of 13.6%. Glucose levels controlled on current treament  4. Anemia with kidney failure:  Hgb-8.7. normocytic.  -Currently prescribed 2 multivitamins -order iron, TIBC  to evaluate  5. Hypernatremia: free water deficit of 6.7 liters - Current Na level-151 -ordered IVF 1/2 NS at 170ml/hr   LOS: 13 Taji Barretto 2/14/20224:07 PM

## 2021-01-25 NOTE — Evaluation (Signed)
Clinical/Bedside Swallow Evaluation Patient Details  Name: Erik [name] MRN: [id number] Date of Birth: 1991/06/19  Today's Date: 01/25/2021 Time: SLP Start Time (ACUTE ONLY): 1130 SLP Stop Time (ACUTE ONLY): 1225 SLP Time Calculation (min) (ACUTE ONLY): 55 min  Past Medical History:  Past Medical History:  Diagnosis Date  . Asthma    Past Surgical History:  Past Surgical History:  Procedure Laterality Date  . LEFT HEART CATH AND CORONARY ANGIOGRAPHY N/A 01/20/2021   Procedure: LEFT HEART CATH AND CORONARY ANGIOGRAPHY;  Surgeon: Yvonne Kendall, MD;  Location: [facility] INVASIVE CV LAB;  Service: Cardiovascular;  Laterality: N/A;   HPI:  Per admitting H&P " Erik [name] is a 30 y.o. male with medical history significant for not on previous prescription medications, has asthma however has not been a provider since he was a teenager.     He presents via the mother for chief concerns of weakness, poor p.o. intake, lethargy that has been ongoing for the last 2 weeks, 12/27/2020.  She denies fever at home.  She reports that he endorses nausea and vomiting.  He has had poor p.o. intake but has been drinking apple juice and orange juice" After presenting to the ER with progressive weakness, decreased PO intake, nausea/vomiting, he wasadmitted for management of acute hypoxic respiratory failure due to COVID-19 viral infection, DKA and metabolic encephalopathy.  Pt was emergently intubated 2/1, extubated 01/23/21;   Assessment / Plan / Recommendation Clinical Impression  This pleasant 30 YO male presents with mild to moderate dysphagia characterized by inconsistent s/s of aspiration with thin liquids and solids. Pt was extubated on 01/23/21 after 11 days on a ventilator for respiratory failure secondary to COVID 19. Pt was extremely weak during the swallow assessment unable to feed himself without hand to hand assist. Noted decreased laryngeal excursion for each swallow. Pt tolerated sips of nectar thick liquids  and bites of applesauce without s/s of aspiration. Vocal quality was weak but remained clear. Pt is in a negative pressure room secondary to COVID. Pt's. low volume combined with noise from the negative pressure room made communication very difficult. Very slow mastication of solid cracker along with delayed cough. Rec Dys 1 diet with nectar thick liquids for now. Strict aspiration precautions. Meds crushed in applesauce. Ice chips ok. ST to follow closely. SLP Visit Diagnosis: Dysphagia, oropharyngeal phase (R13.12)    Aspiration Risk  Mild aspiration risk;Moderate aspiration risk    Diet Recommendation Dysphagia 1 (Puree);Nectar-thick liquid   Medication Administration: Crushed with puree Supervision: Staff to assist with self feeding Compensations: Minimize environmental distractions;Slow rate;Small sips/bites Postural Changes: Seated upright at 90 degrees;Remain upright for at least 30 minutes after po intake;Other (Comment) (Will need to sat fully upright)    Other  Recommendations  PT/OT   Follow up Recommendations Skilled Nursing facility;Inpatient Rehab      Frequency and Duration min 2x/week  2 weeks       Prognosis Prognosis for Safe Diet Advancement: Good      Swallow Study   General Date of Onset: 01/12/21 HPI: Per admitting H&P " Erik [name] is a 30 y.o. male with medical history significant for not on previous prescription medications, has asthma however has not been a provider since he was a teenager.     He presents via the mother for chief concerns of weakness, poor p.o. intake, lethargy that has been ongoing for the last 2 weeks, 12/27/2020.  She denies fever at home.  She reports that he endorses  nausea and vomiting.  He has had poor p.o. intake but has been drinking apple juice and orange juice" After presenting to the ER with progressive weakness, decreased PO intake, nausea/vomiting, he wasadmitted for management of acute hypoxic respiratory failure due to COVID-19  viral infection, DKA and metabolic encephalopathy.  Pt was emergently intubated 2/1, extubated 01/23/21; Type of Study: Bedside Swallow Evaluation Diet Prior to this Study: NPO Respiratory Status: Room air History of Recent Intubation: Yes Length of Intubations (days): 11 days Date extubated: 01/23/21 Behavior/Cognition: Alert;Cooperative;Pleasant mood Oral Cavity Assessment: Within Functional Limits Oral Cavity - Dentition: Adequate natural dentition Vision: Functional for self-feeding Self-Feeding Abilities: Total assist;Other (Comment) (significant weakness) Patient Positioning: Upright in bed;Other (comment) (Obese, somewhat difficulty to position) Baseline Vocal Quality: Hoarse;Low vocal intensity Volitional Cough: Strong    Oral/Motor/Sensory Function Overall Oral Motor/Sensory Function: Within functional limits   Ice Chips Ice chips: Within functional limits Presentation: Spoon   Thin Liquid Thin Liquid: Impaired Presentation: Spoon;Cup Pharyngeal  Phase Impairments: Cough - Delayed    Nectar Thick Nectar Thick Liquid: Within functional limits Presentation: Cup   Honey Thick Honey Thick Liquid: Not tested   Puree Puree: Within functional limits   Solid     Solid: Impaired Presentation: Self Fed Oral Phase Impairments: Impaired mastication Oral Phase Functional Implications: Oral residue;Prolonged oral transit;Impaired mastication Pharyngeal Phase Impairments: Cough - Delayed      Eather Colas 01/25/2021,12:58 PM

## 2021-01-25 NOTE — Progress Notes (Signed)
PHARMACY CONSULT NOTE   Pharmacy Consult for Electrolyte Monitoring and Replacement   Recent Labs: Potassium (mmol/L)  Date Value  01/25/2021 4.0   Magnesium (mg/dL)  Date Value  72/53/6644 2.3   Calcium (mg/dL)  Date Value  03/47/4259 8.9   Albumin (g/dL)  Date Value  56/38/7564 2.2 (L)   Phosphorus (mg/dL)  Date Value  33/29/5188 4.8 (H)   Sodium (mmol/L)  Date Value  01/25/2021 151 (H)    Assessment: 30 year old male admitted with COVID-19 pneumonia and DKA. Started on an insulin drip. Patient with hypoxic respiratory failure requiring intubation 2/1 extubated 2/12.  Pharmacy to manage electrolytes.  Nephrology following. Previously on CRRT which was stopped 2/9. Following daily for HD needs.  Goal of Therapy:  Electrolytes WNL Per Cards (for VT): Maintain K>4.0, Mg>2.0.   Plan:  No electrolyte replacement at this time. Sodium and Phos remain elevated.  Tube feeds have been on hold for bowel rest. Pt transferred from CCU to PCU. Will d/c pharmacy CCM consult. Re-consult if needed.   Ronnald Ramp, PharmD, BCPS 01/25/2021 8:29 AM

## 2021-01-25 NOTE — Progress Notes (Signed)
Nutrition Follow-up  DOCUMENTATION CODES:   Morbid obesity  INTERVENTION:   Nepro Shake po TID, each supplement provides 425 kcal and 19 grams protein  MVI po daily   Rena-vit po daily   NUTRITION DIAGNOSIS:   Inadequate oral intake related to inability to eat as evidenced by NPO status. -resolving   GOAL:   Patient will meet greater than or equal to 90% of their needs -previously met with tube feeds, progressing with oral diet   MONITOR:   PO intake,Supplement acceptance,Labs,Weight trends,Skin,I & O's  ASSESSMENT:   30 year old male with PMHx of asthma admitted with COVID-19 PNA and AKI.   Pt extubated 2/12. Pt seen by SLP today and placed on a dysphagia 1/nectar thick diet. RD will add supplements and MVI to help pt meet his estimated needs. Per chart, pt is up ~61lbs since admit; RD unsure if patients bed weights are accurate.    Medications reviewed and include: aspirin, colace, lovenox, insulin, solu-medrol, rena-vit, miralax, NaCl '@100ml' /hr, pepcid  Labs reviewed: Na 151(H). K 4.0 wnl, BUN 59(H), creat 2.74(H), P 4.8(H), Mg 2.3 wnl Wbc- 12.2(H), Hgb 8.7(L), Hct 29.0(L) cbgs- 145, 78, 90 x 24 hrs  Diet Order:   Diet Order            DIET - DYS 1 Room service appropriate? Yes; Fluid consistency: Nectar Thick  Diet effective now                EDUCATION NEEDS:   No education needs have been identified at this time  Skin:  Skin Assessment: Reviewed RN Assessment  Last BM:  2/14- type 7  Height:   Ht Readings from Last 1 Encounters:  01/12/21 6' (1.829 m)   Weight:   Wt Readings from Last 1 Encounters:  01/25/21 (!) 186.9 kg   Ideal Body Weight:  80.9 kg  BMI:  Body mass index is 55.88 kg/m.  Estimated Nutritional Needs:   Kcal:  3200-3500kcal/day  Protein:  >150g/day  Fluid:  2.5-2.8L/day  Koleen Distance MS, RD, LDN Please refer to [facility] for RD and/or RD on-call/weekend/after hours pager

## 2021-01-25 NOTE — Progress Notes (Signed)
Physical Therapy Treatment Patient Details Name: Erik [name] MRN: [id number] DOB: 1991-09-10 Today's Date: 01/25/2021    History of Present Illness presented to ER secondary to progressive weakness, decreased PO intake, nausea/vomiting; admitted for management of acute hypoxic respiratory failure due to COVID-19 viral infection, DKA and metabolic encephalopathy.  Hospital course significant for emergent intubation 2/1-2/12/22; CRRT 2/3-01/20/21 via L temporary IJ dialysis catheter (R femoral removed); code STEMI with emergent cardiac cath (showing no significant coronary disease) 01/20/21 with elevated troponin associated with myocarditis.    PT Comments    Able to initiate transition to unsupported sitting edge of bed this date.  Requires max assist +2-3 for bed mobility efforts, but demonstrates solid effort to actively assist within abilities. Once positioned edge of bed, requiring min/mod assist for trunk control; does progress to periods of close sup with facilitation for postural extension, midline orientation (in A/P and M/L planes); fair tolerance for forward weight shift, but limited active co-contraction of quads/hams palpable with closed-chain WBing. Patient very motivated and engaged throughout session; improved vocal quality and overall affect noted throughout session.  May consider OOB to chair with lift equipment as appropriate next date.    Follow Up Recommendations  CIR     Equipment Recommendations       Recommendations for Other Services       Precautions / Restrictions Precautions Precautions: Fall Restrictions Weight Bearing Restrictions: No Other Position/Activity Restrictions: Temp HD IJ cath    Mobility  Bed Mobility Overal bed mobility: Needs Assistance Bed Mobility: Supine to Sit;Sit to Supine     Supine to sit: Max assist;+2 for physical assistance Sit to supine: Max assist;+2 for physical assistance   General bed mobility comments: Good efforts for  act assist as able, but remains extremely weak and requires act assist at all extremities/trunk to complete movement transition    Transfers                 General transfer comment: unsafe/unable  Ambulation/Gait             General Gait Details: unsafe/unable   Stairs             Wheelchair Mobility    Modified Rankin (Stroke Patients Only)       Balance Overall balance assessment: Needs assistance Sitting-balance support: No upper extremity supported;Feet supported Sitting balance-Leahy Scale: Poor Sitting balance - Comments: Initialy min/mod assist for trunk control and sitting balance, improving to periods of close supervision with consistent verbal cuing for correction Postural control: Posterior lean;Right lateral lean                                  Cognition Arousal/Alertness: Awake/alert Behavior During Therapy: WFL for tasks assessed/performed Overall Cognitive Status: Within Functional Limits for tasks assessed                                 General Comments: Pt able to follow commands and demonstrate active participation throughout session      Exercises Other Exercises Other Exercises: Unsupported sitting edge of bed, min/mod progressing to periods of close supervision-participated with activities to promote midline awareness in A/P, M/L planes, development of self/protective righting reactions, overall postural extension and forward trunk lean/anterior weight translation as able.  Does demonstrate self-righting approx 50% time, but with increased R posterior/lateral lean with fatigue.  Excellent  effort with tasks throughout session.    General Comments        Pertinent Vitals/Pain Pain Assessment: No/denies pain    Home Living Family/patient expects to be discharged to:: Private residence Living Arrangements: Parent Available Help at Discharge: Family Type of Home: House     Home Layout:  Multi-level;Bed/bath upstairs Home Equipment: None      Prior Function Level of Independence: Independent      Comments: Indep with ADLs, household and community mobilization without assist device; denies fall history; no home O2; enjoys video games.   PT Goals (current goals can now be found in the care plan section) Acute Rehab PT Goals Patient Stated Goal: to get stronger PT Goal Formulation: With patient Time For Goal Achievement: 02/07/21 Progress towards PT goals: Progressing toward goals    Frequency    Min 2X/week      PT Plan Current plan remains appropriate    Co-evaluation   Reason for Co-Treatment: Complexity of the patient's impairments (multi-system involvement);For patient/therapist safety;To address functional/ADL transfers PT goals addressed during session: Mobility/safety with mobility;Strengthening/ROM OT goals addressed during session: ADL's and self-care;Strengthening/ROM      AM-PAC PT "6 Clicks" Mobility   Outcome Measure  Help needed turning from your back to your side while in a flat bed without using bedrails?: Total Help needed moving from lying on your back to sitting on the side of a flat bed without using bedrails?: Total Help needed moving to and from a bed to a chair (including a wheelchair)?: Total Help needed standing up from a chair using your arms (e.g., wheelchair or bedside chair)?: Total Help needed to walk in hospital room?: Total Help needed climbing 3-5 steps with a railing? : Total 6 Click Score: 6    End of Session   Activity Tolerance: Patient tolerated treatment well Patient left: in bed;with call bell/phone within reach;with bed alarm set Nurse Communication: Mobility status PT Visit Diagnosis: Muscle weakness (generalized) (M62.81);Difficulty in walking, not elsewhere classified (R26.2)     Time: 5465-6812 PT Time Calculation (min) (ACUTE ONLY): 37 min  Charges:  $Therapeutic Activity: 8-22 mins                      Bridney Guadarrama H. Manson Passey, PT, DPT, NCS 01/25/21, 1:39 PM [phone]

## 2021-01-26 LAB — GLUCOSE, CAPILLARY
Glucose-Capillary: 69 mg/dL — ABNORMAL LOW (ref 70–99)
Glucose-Capillary: 108 mg/dL — ABNORMAL HIGH (ref 70–99)
Glucose-Capillary: 101 mg/dL — ABNORMAL HIGH (ref 70–99)
Glucose-Capillary: 138 mg/dL — ABNORMAL HIGH (ref 70–99)
Glucose-Capillary: 201 mg/dL — ABNORMAL HIGH (ref 70–99)
Glucose-Capillary: 147 mg/dL — ABNORMAL HIGH (ref 70–99)

## 2021-01-26 LAB — CBC WITH DIFFERENTIAL/PLATELET
Abs Immature Granulocytes: 0.14 10*3/uL — ABNORMAL HIGH (ref 0.00–0.07)
Basophils Absolute: 0 10*3/uL (ref 0.0–0.1)
Basophils Relative: 0 %
Eosinophils Absolute: 0 10*3/uL (ref 0.0–0.5)
Eosinophils Relative: 0 %
HCT: 30 % — ABNORMAL LOW (ref 39.0–52.0)
Hemoglobin: 8.8 g/dL — ABNORMAL LOW (ref 13.0–17.0)
Immature Granulocytes: 1 %
Lymphocytes Relative: 16 %
Lymphs Abs: 2 10*3/uL (ref 0.7–4.0)
MCH: 25.8 pg — ABNORMAL LOW (ref 26.0–34.0)
MCHC: 29.3 g/dL — ABNORMAL LOW (ref 30.0–36.0)
MCV: 88 fL (ref 80.0–100.0)
Monocytes Absolute: 1.5 10*3/uL — ABNORMAL HIGH (ref 0.1–1.0)
Monocytes Relative: 12 %
Neutro Abs: 8.7 10*3/uL — ABNORMAL HIGH (ref 1.7–7.7)
Neutrophils Relative %: 71 %
Platelets: 350 10*3/uL (ref 150–400)
RBC: 3.41 MIL/uL — ABNORMAL LOW (ref 4.22–5.81)
RDW: 15.7 % — ABNORMAL HIGH (ref 11.5–15.5)
WBC: 12.4 10*3/uL — ABNORMAL HIGH (ref 4.0–10.5)
nRBC: 0.2 % (ref 0.0–0.2)

## 2021-01-26 LAB — IRON AND TIBC
Iron: 36 ug/dL — ABNORMAL LOW (ref 45–182)
Saturation Ratios: 16 % — ABNORMAL LOW (ref 17.9–39.5)
TIBC: 232 ug/dL — ABNORMAL LOW (ref 250–450)
UIBC: 196 ug/dL

## 2021-01-26 LAB — RENAL FUNCTION PANEL
Albumin: 2.5 g/dL — ABNORMAL LOW (ref 3.5–5.0)
Anion gap: 10 (ref 5–15)
BUN: 53 mg/dL — ABNORMAL HIGH (ref 6–20)
CO2: 21 mmol/L — ABNORMAL LOW (ref 22–32)
Calcium: 8.8 mg/dL — ABNORMAL LOW (ref 8.9–10.3)
Chloride: 121 mmol/L — ABNORMAL HIGH (ref 98–111)
Creatinine, Ser: 2.53 mg/dL — ABNORMAL HIGH (ref 0.61–1.24)
GFR, Estimated: 34 mL/min — ABNORMAL LOW (ref >60)
Glucose, Bld: 98 mg/dL (ref 70–99)
Phosphorus: 3.9 mg/dL (ref 2.5–4.6)
Potassium: 4.4 mmol/L (ref 3.5–5.1)
Sodium: 152 mmol/L — ABNORMAL HIGH (ref 135–145)

## 2021-01-26 LAB — FIBRIN DERIVATIVES D-DIMER (ARMC ONLY): Fibrin derivatives D-dimer (ARMC): 2169.85 ng/mL (FEU) — ABNORMAL HIGH (ref 0.00–499.00)

## 2021-01-26 LAB — MAGNESIUM: Magnesium: 2.2 mg/dL (ref 1.7–2.4)

## 2021-01-26 LAB — C-REACTIVE PROTEIN: CRP: 1.6 mg/dL — ABNORMAL HIGH (ref <1.0)

## 2021-01-26 LAB — FERRITIN: Ferritin: 566 ng/mL — ABNORMAL HIGH (ref 24–336)

## 2021-01-26 MED ORDER — ENOXAPARIN SODIUM 100 MG/ML ~~LOC~~ SOLN
0.5000 mg/kg | SUBCUTANEOUS | Status: DC
  Administered 2021-01-26 – 2021-01-27 (×2): 82.5 mg via SUBCUTANEOUS
  Filled 2021-01-26 (×2): qty 1

## 2021-01-26 MED ORDER — DEXTROSE 50 % IV SOLN
12.5000 g | INTRAVENOUS | Status: AC
  Administered 2021-01-26: 12.5 g via INTRAVENOUS

## 2021-01-26 MED ORDER — TRAMADOL HCL 50 MG PO TABS
50.0000 mg | ORAL_TABLET | Freq: Once | ORAL | Status: AC
  Administered 2021-01-26: 50 mg via ORAL
  Filled 2021-01-26: qty 1

## 2021-01-26 MED ORDER — FAMOTIDINE 20 MG PO TABS
20.0000 mg | ORAL_TABLET | Freq: Every day | ORAL | Status: DC
  Administered 2021-01-27 – 2021-02-03 (×8): 20 mg via ORAL
  Filled 2021-01-26 (×8): qty 1

## 2021-01-26 MED ORDER — DEXTROSE 5 % IV SOLN: INTRAVENOUS | Status: DC

## 2021-01-26 NOTE — Progress Notes (Signed)
Inpatient Diabetes Program Recommendations  AACE/ADA: New Consensus Statement on Inpatient Glycemic Control (2015)  Target Ranges:  Prepandial:   less than 140 mg/dL      Peak postprandial:   less than 180 mg/dL (1-2 hours)      Critically ill patients:  140 - 180 mg/dL   Lab Results  Component Value Date   GLUCAP 101 (H) 01/26/2021   HGBA1C 13.6 (H) 01/12/2021    Review of Glycemic Control Results for Erik [name], Erik [name] (MRN [id number]) as of 01/26/2021 09:54  Ref. Range 01/25/2021 09:54 01/25/2021 18:06 01/25/2021 19:48 01/25/2021 21:28 01/25/2021 23:12 01/26/2021 03:26 01/26/2021 04:45 01/26/2021 08:56  Glucose-Capillary Latest Ref Range: 70 - 99 mg/dL 90 798 (H) Novolog 20 units 342 (H) Novolog 15 units 239 (H) 130 (H) Novolog 3 units 69 (L) 108 (H) 101 (H)   Inpatient Diabetes Program Recommendations: Noted CBG decreased to 69 post Novolog correction   -Decrease Novolog correction to 0-15 units Secure chat sent to Dr. Natale Milch.  Thank you, Billy Fischer. Dimetri Armitage, RN, MSN, CDE  Diabetes Coordinator Inpatient Glycemic Control Team Team Pager [phone] (8am-5pm) 01/26/2021 9:58 AM

## 2021-01-26 NOTE — Progress Notes (Signed)
CBG 69.  12,5g of D50 given IVP.  Will continue to monitor.

## 2021-01-26 NOTE — Progress Notes (Signed)
PROGRESS NOTE    Erik [name]  AJG:[id number] DOB: [birth year] DOA: 01/12/2021 PCP: Pcp, No   Brief Narrative:  30 yo morbidly obese male with ASTHMA now with progressive multiorgan failure with COVID 19 pneumonia and ARDS with severe acidosis and severe hypoxia with ARDS with acute renal failure; now extubated and off renal replacement therapy.   Summary of events:  2/1 Emergently intubated 2/2 severe resp failure, vasc cath placed 2/3 severe resp failure 01/15/2021-patient is critically Ill, CRRT continued. Borderline low blood glucose today.  01/16/2021-patient with thickened heavy ETT secretions. RN reporting inadequate sedation with patient making motions to remove airway, we have advanced RASS to range -3 to -4. Remains critically ill with ongoing renal replacement therapy and COVID treatment.  01/17/2021-patient had thickened secretions again today, he was lavaged with saline by RT today with good response. We will try awakening trial again in AM.  01/18/2021-remains on vent, on CRRT 01/19/2021-acute MI 2/9 s/p left heart cath, multiorgan failure+vomiting 2/10 failed wean trial 2/11 - successfully extubated  Assessment & Plan:   Principal Problem:   Acute hypoxemic respiratory failure due to COVID-19 [facility]) Active Problems:   DKA (diabetic ketoacidosis) (HCC)   Obesity, Class III, BMI 40-49.9 (morbid obesity) (HCC)   Acute metabolic encephalopathy   ARDS (adult respiratory distress syndrome) (HCC)   ST elevation myocardial infarction (STEMI) (HCC)   Acute respiratory failure with hypoxia (HCC)   Acute hypoxemic respiratory failure due to severe COVID-19 pneumonia/ARDS Rquire Mechanical ventilation  Successfully extubated 01/22/21 -currently on room air at rest Completed remdesivir Due to suspected CAP no baricitinib/actemra Completed antibiotics Continue steroid taper Labs downtrending appropriately  AKI with acute renal failure, resolving - Foley catheter  ongoing - will remove when patient more mobile - I/O remains negative off renal replacement - appreciate nephrology insight/recs Lab Results  Component Value Date   CREATININE 2.53 (H) 01/26/2021   CREATININE 2.74 (H) 01/25/2021   CREATININE 3.15 (H) 01/24/2021    Acute inferior STEMI with associated diastolic heart failure and Vtach -Cardiology following - appreciate insight/recs -No recurrent episodes of VT reported -Remains on room air at rest -Lasix previously discontinued in the setting of renal failure - I/O negative  Intake/Output Summary (Last 24 hours) at 01/26/2021 0817 Last data filed at 01/26/2021 0449 Gross per 24 hour  Intake 976 ml  Output 950 ml  Net 26 ml   Ambulatory dysfunction, profound  -Likely in the setting of obesity and acute illness -Foley catheter and rectal tube still in place until patient more mobile, patient is very difficult to roll and clean frequently  Morbid obesity, high risk for OSA (undiagnosed).  Will certainly impact respiratory mechanics, ventilator weaning Suspect will need to consider additional PEEP  Profound hyperglycemia secondary to newly diagnosed uncontrolled diabetes - insulin dependent Lab Results  Component Value Date   HGBA1C 13.6 (H) 01/12/2021  Continue sliding scale insulin Increase levemir as indicated - currently on 39u HS (weight-based) Hypoglycemic protocol as needed   DVT prophylaxis: heparin subQ Code Status: Full Family Communication: Updated over the phone  Status is: Inpt  Dispo: The patient is from: Home              Anticipated d/c is to: TBD              Anticipated d/c date is: >72h               Patient currently NOT medically stable for discharge  Consultants:   ICU, Nephrology  Antimicrobials:  Completed azithromycin/ceftriaxone 5 day course 01/16/21 Doxycycline stopped 01/18/21 Completed Remdesivir 01/16/21  Erythromycin 2/10 -discontinued 2/14  Subjective: No acute issues/events overnight  -patient much more awake this morning, denies nausea vomiting diarrhea constipation headache fevers or chills.  We did discuss removal of Foley and rectal tubes, will hold off removal for now given patient's poor mobility but discussed that if he tolerates improving ambulation over the next day or two it would be reasonable to remove.  Objective: Vitals:   01/25/21 1216 01/25/21 1952 01/26/21 0331 01/26/21 0449  BP: (!) 158/91 (!) 157/94 130/79   Pulse: (!) 106 (!) 110 (!) 106   Resp: 20 20 20    Temp:  99.2 F (37.3 C) 98.5 F (36.9 C)   TempSrc:  Oral Oral   SpO2: 93% 94% 97%   Weight:    (!) 165.1 kg  Height:        Intake/Output Summary (Last 24 hours) at 01/26/2021 0817 Last data filed at 01/26/2021 0449 Gross per 24 hour  Intake 976 ml  Output 950 ml  Net 26 ml   Filed Weights   01/23/21 0500 01/25/21 0412 01/26/21 0449  Weight: (!) 195.5 kg (!) 186.9 kg (!) 165.1 kg    Examination:  General exam: Appears calm and comfortable, no acute distress Respiratory system: Bilateral ronchi/coarse breath sounds. Cardiovascular system: RRR Gastrointestinal system: Abdomen is obese, soft and nontender. No organomegaly or masses felt. Normal bowel sounds heard.  Foley and rectal tubes in place. Central nervous system: No focal neurological deficits. Extremities: Symmetric 5 x 5 power. Skin: No rashes, lesions  Data Reviewed: I have personally reviewed following labs and imaging studies  CBC: Recent Labs  Lab 01/22/21 0526 01/23/21 0517 01/24/21 0615 01/25/21 0515 01/26/21 0651  WBC 13.7* 10.9* 13.2* 12.2* 12.4*  NEUTROABS 8.4* 7.6 10.0* 8.9* 8.7*  HGB 8.0* 8.3* 9.2* 8.7* 8.8*  HCT 25.8* 27.0* 30.2* 29.0* 30.0*  MCV 83.5 83.3 83.7 85.3 88.0  PLT 239 292 371 340 350   Basic Metabolic Panel: Recent Labs  Lab 01/22/21 0526 01/23/21 0517 01/24/21 0615 01/25/21 0515 01/26/21 0651  NA 139 142 148* 151* 152*  K 3.9 5.0 4.6 4.0 4.4  CL 107 109 114* 119* 121*  CO2 21*  21* 20* 23 21*  GLUCOSE 128* 216* 159* 90 98  BUN 51* 62* 67* 59* 53*  CREATININE 3.39* 3.44* 3.15* 2.74* 2.53*  CALCIUM 8.3* 8.8* 8.8* 8.9 8.8*  MG 2.2 2.4 2.3 2.3 2.2  PHOS 5.7* 7.0* 6.1* 4.8* 3.9   GFR: Estimated Creatinine Clearance: 68.6 mL/min (A) (by C-G formula based on SCr of 2.53 mg/dL (H)). Liver Function Tests: Recent Labs  Lab 01/22/21 0526 01/23/21 0517 01/24/21 0615 01/25/21 0515 01/26/21 0651  ALBUMIN 2.1* 1.9* 2.3* 2.2* 2.5*   No results for input(s): LIPASE, AMYLASE in the last 168 hours. No results for input(s): AMMONIA in the last 168 hours. Coagulation Profile: No results for input(s): INR, PROTIME in the last 168 hours. Cardiac Enzymes: No results for input(s): CKTOTAL, CKMB, CKMBINDEX, TROPONINI in the last 168 hours. BNP (last 3 results) No results for input(s): PROBNP in the last 8760 hours. HbA1C: No results for input(s): HGBA1C in the last 72 hours. CBG: Recent Labs  Lab 01/25/21 1948 01/25/21 2128 01/25/21 2312 01/26/21 0326 01/26/21 0445  GLUCAP 342* 239* 130* 69* 108*   Lipid Profile: No results for input(s): CHOL, HDL, LDLCALC, TRIG, CHOLHDL, LDLDIRECT in the last 72 hours. Thyroid Function Tests: No results  for input(s): TSH, T4TOTAL, FREET4, T3FREE, THYROIDAB in the last 72 hours. Anemia Panel: Recent Labs    01/25/21 0515 01/26/21 0651  FERRITIN 479* 566*  TIBC  --  232*  IRON  --  36*   Sepsis Labs: No results for input(s): PROCALCITON, LATICACIDVEN in the last 168 hours.  Recent Results (from the past 240 hour(s))  Culture, respiratory (non-expectorated)     Status: None   Collection Time: 01/19/21 10:41 PM   Specimen: Tracheal Aspirate; Respiratory  Result Value Ref Range Status   Specimen Description   Final    TRACHEAL ASPIRATE Performed at [facility], [address], [place], [place] [address]    Special Requests   Final    NONE Performed at [facility], [address] [place],  [place] [address]    Gram Stain   Final    FEW WBC PRESENT,BOTH PMN AND MONONUCLEAR RARE BUDDING YEAST SEEN Performed at [facility], [address], [place], [place] [address]    Culture FEW CANDIDA ALBICANS  Final   Report Status 01/22/2021 FINAL  Final     Radiology Studies: No results found.  Scheduled Meds: . aspirin  81 mg Oral Daily  . atorvastatin  80 mg Oral Daily  . Chlorhexidine Gluconate Cloth  6 each Topical Daily  . docusate sodium  100 mg Oral BID  . enoxaparin (LOVENOX) injection  0.5 mg/kg Subcutaneous Q24H  . feeding supplement (NEPRO CARB STEADY)  237 mL Oral TID BM  . insulin aspart  0-20 Units Subcutaneous Q4H  . insulin detemir  0.5 Units/kg (Ideal) Subcutaneous QHS  . mouth rinse  15 mL Mouth Rinse BID  . methylPREDNISolone (SOLU-MEDROL) injection  40 mg Intravenous Daily  . metoprolol tartrate  12.5 mg Oral BID  . multivitamin  1 tablet Oral QHS  . multivitamin with minerals  1 tablet Oral Daily  . polyethylene glycol  17 g Oral Daily  . sodium chloride flush  3 mL Intravenous Q12H   Continuous Infusions: . sodium chloride 100 mL/hr at 01/26/21 0247  . sodium chloride    . famotidine (PEPCID) IV 20 mg (01/25/21 1022)     LOS: 14 days   Time spent:  Azucena Fallen, DO [facility]  If 7PM-7AM, please contact night-coverage [email or web address]  01/26/2021, 8:17 AM

## 2021-01-26 NOTE — Progress Notes (Signed)
[facility]  ROUNDING NOTE   Subjective:   Patient seen today resting in bed He is alert and oriented Denies shortness of breath and chest pain He is able to eat small amounts without nausea  UOP 950ml  Objective:  Vital signs in last 24 hours:  Temp:  [98.1 F (36.7 C)-99.2 F (37.3 C)] 98.1 F (36.7 C) (02/15 1255) Pulse Rate:  [100-110] 100 (02/15 1255) Resp:  [19-20] 20 (02/15 1255) BP: (130-157)/(76-94) 140/76 (02/15 1255) SpO2:  [91 %-97 %] 93 % (02/15 1255) Weight:  [165.1 kg] 165.1 kg (02/15 0449)  Weight change: -21.8 kg Filed Weights   01/23/21 0500 01/25/21 0412 01/26/21 0449  Weight: (!) 195.5 kg (!) 186.9 kg (!) 165.1 kg    Intake/Output: I/O last 3 completed shifts: In: 1543.7 [I.V.:1393.7; IV Piggyback:150] Out: 1450 [Urine:500; Other:950]   Intake/Output this shift:  No intake/output data recorded.  Physical Exam: General: NAD  Head: Normocephalic, oral mucosa dry  Eyes: Anicteric  Neck:  trachea midline  Lungs:  Rhonchi-left base  Heart: tachycardia  Abdomen:  Soft, obese  Extremities:  no peripheral edema.  Neurologic: Alert, oriented, able to answer questions  Skin: No lesions  Access: LIJ permcath    Basic Metabolic Panel: Recent Labs  Lab 01/22/21 0526 01/23/21 0517 01/24/21 0615 01/25/21 0515 01/26/21 0651  NA 139 142 148* 151* 152*  K 3.9 5.0 4.6 4.0 4.4  CL 107 109 114* 119* 121*  CO2 21* 21* 20* 23 21*  GLUCOSE 128* 216* 159* 90 98  BUN 51* 62* 67* 59* 53*  CREATININE 3.39* 3.44* 3.15* 2.74* 2.53*  CALCIUM 8.3* 8.8* 8.8* 8.9 8.8*  MG 2.2 2.4 2.3 2.3 2.2  PHOS 5.7* 7.0* 6.1* 4.8* 3.9    Liver Function Tests: Recent Labs  Lab 01/22/21 0526 01/23/21 0517 01/24/21 0615 01/25/21 0515 01/26/21 0651  ALBUMIN 2.1* 1.9* 2.3* 2.2* 2.5*   No results for input(s): LIPASE, AMYLASE in the last 168 hours. No results for input(s): AMMONIA in the last 168 hours.  CBC: Recent Labs  Lab 01/22/21 0526  01/23/21 0517 01/24/21 0615 01/25/21 0515 01/26/21 0651  WBC 13.7* 10.9* 13.2* 12.2* 12.4*  NEUTROABS 8.4* 7.6 10.0* 8.9* 8.7*  HGB 8.0* 8.3* 9.2* 8.7* 8.8*  HCT 25.8* 27.0* 30.2* 29.0* 30.0*  MCV 83.5 83.3 83.7 85.3 88.0  PLT 239 292 371 340 350    Cardiac Enzymes: No results for input(s): CKTOTAL, CKMB, CKMBINDEX, TROPONINI in the last 168 hours.  BNP: Invalid input(s): POCBNP  CBG: Recent Labs  Lab 01/25/21 2312 01/26/21 0326 01/26/21 0445 01/26/21 0856 01/26/21 1227  GLUCAP 130* 69* 108* 101* 138*    Microbiology: Results for orders placed or performed during the hospital encounter of 01/12/21  Blood culture (routine single)     Status: None   Collection Time: 01/12/21  9:05 AM   Specimen: BLOOD  Result Value Ref Range Status   Specimen Description BLOOD LEFT AC  Final   Special Requests   Final    BOTTLES DRAWN AEROBIC ONLY Blood Culture results may not be optimal due to an inadequate volume of blood received in culture bottles   Culture   Final    NO GROWTH 5 DAYS Performed at [facility], [address] [place], [place] [address]    Report Status 01/17/2021 FINAL  Final  SARS Coronavirus 2 by RT PCR (hospital order, performed in [facility] hospital lab) Nasopharyngeal Nasopharyngeal Swab     Status: Abnormal  Collection Time: 01/12/21  9:36 AM   Specimen: Nasopharyngeal Swab  Result Value Ref Range Status   SARS Coronavirus 2 POSITIVE (A) NEGATIVE Final    Comment: RESULT CALLED TO, READ BACK BY AND VERIFIED WITH: ANNA JASPER ON 01/12/21 AT 1111 SDR (NOTE) SARS-CoV-2 target nucleic acids are DETECTED  SARS-CoV-2 RNA is generally detectable in upper respiratory specimens  during the acute phase of infection.  Positive results are indicative  of the presence of the identified virus, but do not rule out bacterial infection or co-infection with other pathogens not detected by the test.  Clinical correlation with patient history and  other  diagnostic information is necessary to determine patient infection status.  The expected result is negative.  Fact Sheet for Patients:   BoilerBrush.com.cy   Fact Sheet for Healthcare Providers:   https://pope.com/    This test is not yet approved or cleared by the [place] FDA and  has been authorized for detection and/or diagnosis of SARS-CoV-2 by FDA under an Emergency Use Authorization (EUA).  This EUA will remain in effect (meaning this tes t can be used) for the duration of  the COVID-19 declaration under Section 564(b)(1) of the Act, 21 U.S.C. section 360-bbb-3(b)(1), unless the authorization is terminated or revoked sooner.  Performed at [facility], [address] [place], [place] [address]   Culture, blood (single)     Status: None   Collection Time: 01/12/21  1:08 PM   Specimen: BLOOD  Result Value Ref Range Status   Specimen Description BLOOD BLOOD RIGHT HAND  Final   Special Requests   Final    BOTTLES DRAWN AEROBIC AND ANAEROBIC Blood Culture adequate volume   Culture   Final    NO GROWTH 5 DAYS Performed at [facility], [address] [place], [place] [address]    Report Status 01/17/2021 FINAL  Final  MRSA PCR Screening     Status: None   Collection Time: 01/12/21  7:22 PM   Specimen: Nasopharyngeal  Result Value Ref Range Status   MRSA by PCR NEGATIVE NEGATIVE Final    Comment:        The GeneXpert MRSA Assay (FDA approved for NASAL specimens only), is one component of a comprehensive MRSA colonization surveillance program. It is not intended to diagnose MRSA infection nor to guide or monitor treatment for MRSA infections. Performed at [facility], [address] [place], [place] [address]   Culture, respiratory (non-expectorated)     Status: None   Collection Time: 01/13/21  3:04 PM   Specimen: Tracheal Aspirate; Respiratory  Result Value Ref Range Status    Specimen Description   Final    TRACHEAL ASPIRATE Performed at [facility], [address] [place], [place] [address]    Special Requests   Final    NONE Performed at [facility], [address] [place], [place] [address]    Gram Stain   Final    ABUNDANT WBC PRESENT, PREDOMINANTLY PMN MODERATE GRAM POSITIVE COCCI Performed at [facility], [address], [place], [place] [address]    Culture FEW STAPHYLOCOCCUS AUREUS  Final   Report Status 01/16/2021 FINAL  Final   Organism ID, Bacteria STAPHYLOCOCCUS AUREUS  Final      Susceptibility   Staphylococcus aureus - MIC*    CIPROFLOXACIN <=0.5 SENSITIVE Sensitive     ERYTHROMYCIN <=0.25 SENSITIVE Sensitive     GENTAMICIN <=0.5 SENSITIVE Sensitive     OXACILLIN 0.5 SENSITIVE  Sensitive     TETRACYCLINE <=1 SENSITIVE Sensitive     VANCOMYCIN 1 SENSITIVE Sensitive     TRIMETH/SULFA <=10 SENSITIVE Sensitive     CLINDAMYCIN <=0.25 SENSITIVE Sensitive     RIFAMPIN <=0.5 SENSITIVE Sensitive     Inducible Clindamycin NEGATIVE Sensitive     * FEW STAPHYLOCOCCUS AUREUS  Culture, respiratory (non-expectorated)     Status: None   Collection Time: 01/19/21 10:41 PM   Specimen: Tracheal Aspirate; Respiratory  Result Value Ref Range Status   Specimen Description   Final    TRACHEAL ASPIRATE Performed at [facility], [address], [place], [place] [address]    Special Requests   Final    NONE Performed at [facility], [address] [place], [place] [address]    Gram Stain   Final    FEW WBC PRESENT,BOTH PMN AND MONONUCLEAR RARE BUDDING YEAST SEEN Performed at [facility], [address], [place], [place] [address]    Culture FEW CANDIDA ALBICANS  Final   Report Status 01/22/2021 FINAL  Final    Coagulation Studies: No results for input(s): LABPROT, INR in the last 72 hours.  Urinalysis: No results for input(s): COLORURINE, LABSPEC, PHURINE, GLUCOSEU, HGBUR, BILIRUBINUR,  KETONESUR, PROTEINUR, UROBILINOGEN, NITRITE, LEUKOCYTESUR in the last 72 hours.  Invalid input(s): APPERANCEUR    Imaging: No results found.   Medications:   . sodium chloride    . dextrose 100 mL/hr at 01/26/21 1007   . aspirin  81 mg Oral Daily  . atorvastatin  80 mg Oral Daily  . Chlorhexidine Gluconate Cloth  6 each Topical Daily  . docusate sodium  100 mg Oral BID  . enoxaparin (LOVENOX) injection  0.5 mg/kg Subcutaneous Q24H  . [START ON 01/27/2021] famotidine  20 mg Oral Daily  . feeding supplement (NEPRO CARB STEADY)  237 mL Oral TID BM  . insulin aspart  0-20 Units Subcutaneous Q4H  . insulin detemir  0.5 Units/kg (Ideal) Subcutaneous QHS  . mouth rinse  15 mL Mouth Rinse BID  . methylPREDNISolone (SOLU-MEDROL) injection  40 mg Intravenous Daily  . metoprolol tartrate  12.5 mg Oral BID  . multivitamin  1 tablet Oral QHS  . multivitamin with minerals  1 tablet Oral Daily  . polyethylene glycol  17 g Oral Daily  . sodium chloride flush  3 mL Intravenous Q12H   sodium chloride, acetaminophen (TYLENOL) oral liquid 160 mg/5 mL, dextrose, hydrALAZINE, metoprolol tartrate, ondansetron **OR** ondansetron (ZOFRAN) IV, ondansetron (ZOFRAN) IV, polyethylene glycol, sodium chloride flush  Assessment/ Plan:  Mr. JIREH ELMORE is a 30 y.o. black male with asthma, with no other past medical history, and no outpatient medications who was admitted to [facility] on 01/12/2021 for ARDS (adult respiratory distress syndrome) (HCC) [J80] Acute respiratory failure with hypoxia (HCC) [J96.01] Acute hypoxemic respiratory failure due to COVID-19 (HCC) [U07.1, J96.01] Hyperosmolar hyperglycemic state (HHS) (HCC) [E11.00, E11.65] Pneumonia due to COVID-19 virus [U07.1, J12.82]   Started on CRRT on admission. Discontinued on 2/9. Hemodynamically stable. Has not required intermittent hemodialysis this admission.   1. Acute kidney injury: with no known creatinine baseline.  UOP 975ml-acceptable at this  time Will continue to monitor No need for dialysis at this time  2. Acute respiratory failure:  Currently on room air  3. 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 Hemoglobin A1c of 13.6%. Glucose levels controlled on current treament  4. Anemia with kidney failure:  Hgb-8.8. normocytic.  -  Currently prescribed 2 multivitamins -TIBC-232 -iron 36  5. Hypernatremia: free water deficit of 6.7 liters - Current Na level-152 -changed IVF to D5 @100  ml/hr   LOS: 14 Evie Crumpler 2/15/20224:20 PM

## 2021-01-27 DIAGNOSIS — R778 Other specified abnormalities of plasma proteins: Secondary | ICD-10-CM

## 2021-01-27 DIAGNOSIS — R7989 Other specified abnormal findings of blood chemistry: Secondary | ICD-10-CM

## 2021-01-27 DIAGNOSIS — I472 Ventricular tachycardia, unspecified: Secondary | ICD-10-CM

## 2021-01-27 LAB — BLOOD GAS, ARTERIAL
Acid-base deficit: 3.3 mmol/L — ABNORMAL HIGH (ref 0.0–2.0)
Bicarbonate: 21.3 mmol/L (ref 20.0–28.0)
FIO2: 1
MECHVT: 550 mL
O2 Saturation: 98.3 %
Patient temperature: 37
RATE: 25 resp/min
pCO2 arterial: 36 mmHg (ref 32.0–48.0)
pH, Arterial: 7.38 (ref 7.350–7.450)
pO2, Arterial: 113 mmHg — ABNORMAL HIGH (ref 83.0–108.0)

## 2021-01-27 LAB — D-DIMER, QUANTITATIVE: D-Dimer, Quant: 2.21 ug/mL-FEU — ABNORMAL HIGH (ref 0.00–0.50)

## 2021-01-27 LAB — CBC WITH DIFFERENTIAL/PLATELET
Abs Immature Granulocytes: 0.07 10*3/uL (ref 0.00–0.07)
Basophils Absolute: 0 10*3/uL (ref 0.0–0.1)
Basophils Relative: 0 %
Eosinophils Absolute: 0.1 10*3/uL (ref 0.0–0.5)
Eosinophils Relative: 1 %
HCT: 30.7 % — ABNORMAL LOW (ref 39.0–52.0)
Hemoglobin: 9.1 g/dL — ABNORMAL LOW (ref 13.0–17.0)
Immature Granulocytes: 1 %
Lymphocytes Relative: 20 %
Lymphs Abs: 1.7 10*3/uL (ref 0.7–4.0)
MCH: 26.2 pg (ref 26.0–34.0)
MCHC: 29.6 g/dL — ABNORMAL LOW (ref 30.0–36.0)
MCV: 88.5 fL (ref 80.0–100.0)
Monocytes Absolute: 0.7 10*3/uL (ref 0.1–1.0)
Monocytes Relative: 8 %
Neutro Abs: 6.2 10*3/uL (ref 1.7–7.7)
Neutrophils Relative %: 70 %
Platelets: 350 10*3/uL (ref 150–400)
RBC: 3.47 MIL/uL — ABNORMAL LOW (ref 4.22–5.81)
RDW: 15.5 % (ref 11.5–15.5)
WBC: 8.8 10*3/uL (ref 4.0–10.5)
nRBC: 0 % (ref 0.0–0.2)

## 2021-01-27 LAB — GLUCOSE, CAPILLARY
Glucose-Capillary: 105 mg/dL — ABNORMAL HIGH (ref 70–99)
Glucose-Capillary: 123 mg/dL — ABNORMAL HIGH (ref 70–99)
Glucose-Capillary: 155 mg/dL — ABNORMAL HIGH (ref 70–99)
Glucose-Capillary: 221 mg/dL — ABNORMAL HIGH (ref 70–99)
Glucose-Capillary: 112 mg/dL — ABNORMAL HIGH (ref 70–99)

## 2021-01-27 LAB — LIPID PANEL
Cholesterol: 165 mg/dL (ref 0–200)
HDL: 54 mg/dL (ref >40)
LDL Cholesterol: 65 mg/dL (ref 0–99)
Total CHOL/HDL Ratio: 3.1 RATIO
Triglycerides: 230 mg/dL — ABNORMAL HIGH (ref <150)
VLDL: 46 mg/dL — ABNORMAL HIGH (ref 0–40)

## 2021-01-27 LAB — FERRITIN: Ferritin: 406 ng/mL — ABNORMAL HIGH (ref 24–336)

## 2021-01-27 LAB — C-REACTIVE PROTEIN: CRP: 0.8 mg/dL (ref <1.0)

## 2021-01-27 MED ORDER — ALBUTEROL SULFATE HFA 108 (90 BASE) MCG/ACT IN AERS
1.0000 | INHALATION_SPRAY | Freq: Four times a day (QID) | RESPIRATORY_TRACT | Status: DC
  Administered 2021-01-27 – 2021-02-03 (×25): 2 via RESPIRATORY_TRACT
  Filled 2021-01-27 (×2): qty 6.7

## 2021-01-27 MED ORDER — DM-GUAIFENESIN ER 30-600 MG PO TB12: 1.0000 | ORAL_TABLET | Freq: Two times a day (BID) | ORAL | Status: DC | PRN

## 2021-01-27 MED ORDER — INSULIN DETEMIR 100 UNIT/ML ~~LOC~~ SOLN
30.0000 [IU] | Freq: Every day | SUBCUTANEOUS | Status: DC
  Administered 2021-01-27 – 2021-02-01 (×6): 30 [IU] via SUBCUTANEOUS
  Filled 2021-01-27 (×6): qty 0.3

## 2021-01-27 MED ORDER — BLISTEX MEDICATED EX OINT
1.0000 "application " | TOPICAL_OINTMENT | CUTANEOUS | Status: DC | PRN
  Filled 2021-01-27: qty 6.3

## 2021-01-27 MED ORDER — CLOPIDOGREL BISULFATE 75 MG PO TABS
75.0000 mg | ORAL_TABLET | Freq: Every day | ORAL | Status: DC
  Administered 2021-01-27 – 2021-02-03 (×8): 75 mg via ORAL
  Filled 2021-01-27 (×8): qty 1

## 2021-01-27 MED ORDER — METOPROLOL TARTRATE 5 MG/5ML IV SOLN: 5.0000 mg | INTRAVENOUS | Status: DC | PRN

## 2021-01-27 MED ORDER — METOPROLOL TARTRATE 25 MG PO TABS
25.0000 mg | ORAL_TABLET | Freq: Two times a day (BID) | ORAL | Status: DC
  Administered 2021-01-27 – 2021-02-01 (×10): 25 mg via ORAL
  Filled 2021-01-27 (×10): qty 1

## 2021-01-27 NOTE — Progress Notes (Signed)
Progress Note  Patient Name: Erik [name] Date of Encounter: 01/27/2021  [facility] Cardiologist: Dr. Okey Dupre  Subjective   Patient denies chest pain, breathing is slowly improving.  Inpatient Medications    Scheduled Meds: . albuterol  1-2 puff Inhalation Q6H  . aspirin  81 mg Oral Daily  . atorvastatin  80 mg Oral Daily  . Chlorhexidine Gluconate Cloth  6 each Topical Daily  . docusate sodium  100 mg Oral BID  . enoxaparin (LOVENOX) injection  0.5 mg/kg Subcutaneous Q24H  . famotidine  20 mg Oral Daily  . feeding supplement (NEPRO CARB STEADY)  237 mL Oral TID BM  . insulin aspart  0-20 Units Subcutaneous Q4H  . insulin detemir  30 Units Subcutaneous Daily  . mouth rinse  15 mL Mouth Rinse BID  . methylPREDNISolone (SOLU-MEDROL) injection  40 mg Intravenous Daily  . metoprolol tartrate  25 mg Oral BID  . multivitamin  1 tablet Oral QHS  . multivitamin with minerals  1 tablet Oral Daily  . polyethylene glycol  17 g Oral Daily  . sodium chloride flush  3 mL Intravenous Q12H   Continuous Infusions: . sodium chloride    . dextrose 100 mL/hr at 01/27/21 0624   PRN Meds: sodium chloride, acetaminophen (TYLENOL) oral liquid 160 mg/5 mL, dextromethorphan-guaiFENesin, dextrose, hydrALAZINE, lip balm, metoprolol tartrate, ondansetron **OR** ondansetron (ZOFRAN) IV, ondansetron (ZOFRAN) IV, polyethylene glycol, sodium chloride flush   Vital Signs    Vitals:   01/27/21 0500 01/27/21 0537 01/27/21 0814 01/27/21 1311  BP: (!) 143/87  (!) 160/101   Pulse: 97  98 (!) 103  Resp: 16  16   Temp: 99.5 F (37.5 C)  99.3 F (37.4 C)   TempSrc: Oral  Oral   SpO2:   96% 97%  Weight:  (!) 162.8 kg    Height:        Intake/Output Summary (Last 24 hours) at 01/27/2021 1355 Last data filed at 01/27/2021 7564 Gross per 24 hour  Intake 1905.5 ml  Output 2000 ml  Net -94.5 ml   Last 3 Weights 01/27/2021 01/26/2021 01/25/2021  Weight (lbs) 359 lb 364 lb 412 lb  Weight (kg) 162.841 kg  165.109 kg 186.882 kg      Telemetry    Sinus tachycardia, heart rate 103- Personally Reviewed  ECG    No new tracing obtained- Personally Reviewed  Physical Exam   GEN: No acute distress.   Neck: No JVD Cardiac: RRR, no murmurs, rubs, or gallops.  Respiratory: Decreased breath sounds at bases GI: Soft, nontender, distended  MS: No edema; No deformity. Neuro:  Nonfocal  Psych: Normal affect   Labs    High Sensitivity Troponin:   Recent Labs  Lab 01/20/21 0518 01/20/21 1003 01/20/21 1620 01/20/21 2350 01/21/21 0901  TROPONINIHS 47* 4,189* 6,440* 6,816* 4,519*      Chemistry Recent Labs  Lab 01/24/21 0615 01/25/21 0515 01/26/21 0651  NA 148* 151* 152*  K 4.6 4.0 4.4  CL 114* 119* 121*  CO2 20* 23 21*  GLUCOSE 159* 90 98  BUN 67* 59* 53*  CREATININE 3.15* 2.74* 2.53*  CALCIUM 8.8* 8.9 8.8*  ALBUMIN 2.3* 2.2* 2.5*  GFRNONAA 26* 31* 34*  ANIONGAP 14 9 10      Hematology Recent Labs  Lab 01/25/21 0515 01/26/21 0651 01/27/21 1105  WBC 12.2* 12.4* 8.8  RBC 3.40* 3.41* 3.47*  HGB 8.7* 8.8* 9.1*  HCT 29.0* 30.0* 30.7*  MCV 85.3 88.0 88.5  MCH  25.6* 25.8* 26.2  MCHC 30.0 29.3* 29.6*  RDW 15.4 15.7* 15.5  PLT 340 350 350    BNPNo results for input(s): BNP, PROBNP in the last 168 hours.   DDimer  Recent Labs  Lab 01/27/21 1105  DDIMER 2.21*     Radiology    No results found.  Cardiac Studies   TTE 01/20/2021 1. Left ventricular ejection fraction, by estimation, is 60 to 65%. The  left ventricle has normal function. The left ventricle has no regional  wall motion abnormalities. Left ventricular diastolic parameters were  normal.  2. Right ventricular systolic function is normal. The right ventricular  size is normal.  3. The mitral valve is normal in structure. No evidence of mitral valve  regurgitation. No evidence of mitral stenosis.  4. The aortic valve is normal in structure. Aortic valve regurgitation is  not visualized. No  aortic stenosis is present.  5. The inferior vena cava is normal in size with greater than 50%  respiratory variability, suggesting right atrial pressure of 3 mmHg.   LHC 01/20/2021 Conclusions: 1. Minimal coronary artery plaquing without angiographically significant coronary artery disease. 2. Moderately reduced left ventricular contraction with global hypokinesis. 3. Mildly elevated left ventricular filling pressure (LVEDP ~20 mmHg).  Recommendations: 1. No obvious coronary artery lesion to explain ST segment changes and sustained ventricular tachycardia.  Possible causes include metabolic/electrolyte derangements and myocarditis in the setting of COVID-19. 2. Trend HS-TnI until it has peaked, then stop. 3. Obtain transthoracic echocardiogram. 4. If blood pressure tolerates, consider addition of low-dose beta-blocker. 5. If patient has recurrent VT, consider IV amiodarone. 6. Replete electrolytes to maintain K > 4.0 and Mg > 2.0.    Patient Profile     30 y.o. male with history of asthma, obesity presenting with COVID-19 pneumonia.  Being seen for ventricular tachycardia and elevated troponins.  Assessment & Plan    1.  Ventricular tachycardia in the setting of COVID-19 -No further episodes since starting beta-blocker. -Heart rates elevated, increase Lopressor to 25 mg twice daily -Echocardiogram with normal systolic and diastolic function.  2.  Elevated troponins/NSTEMI -Left heart cath with no angiographically significant CAD -Due to significant elevated troponins, DAPT/aspirin, Plavix x12 months. -Could have myocarditis, due to obesity /body size, not able to obtain CMR  3.  Morbid obesity -Low calorie diet, weight loss advised  4.  COVID-19 pneumonia -Management as per primary team  Continue current cardiac medications as prescribed and recommended above.  Cardiology will sign off.  Please let us know if additional input is needed.  Total encounter time 35 minutes   Greater than 50% was spent in counseling and coordination of care with the patient       Signed, Debbe Odea, MD  01/27/2021, 1:55 PM

## 2021-01-27 NOTE — Progress Notes (Signed)
[facility]  ROUNDING NOTE   Subjective:   Patient seen today resting in bed He is alert and oriented Complains on discomfort from foley Able to eat meals without nausea No difficulty breathing  UOP 2.1L D5 @100  ml/hr  Objective:  Vital signs in last 24 hours:  Temp:  [98.5 F (36.9 C)-99.5 F (37.5 C)] 99.3 F (37.4 C) (02/16 0814) Pulse Rate:  [97-106] 103 (02/16 1311) Resp:  [16-19] 16 (02/16 0814) BP: (143-191)/(87-102) 160/101 (02/16 0814) SpO2:  [96 %-97 %] 97 % (02/16 1311) Weight:  [162.8 kg] 162.8 kg (02/16 0537)  Weight change: -2.268 kg Filed Weights   01/25/21 0412 01/26/21 0449 01/27/21 0537  Weight: (!) 186.9 kg (!) 165.1 kg (!) 162.8 kg    Intake/Output: I/O last 3 completed shifts: In: 2881.5 [I.V.:2831.5; IV Piggyback:50] Out: 3400 [Urine:2150; Other:950; Stool:300]   Intake/Output this shift:  No intake/output data recorded.  Physical Exam: General: NAD  Head: Normocephalic, oral mucosa dry  Eyes: Anicteric  Neck:  trachea midline  Lungs:  Rhonchi-left base  Heart: tachycardia  Abdomen:  Soft, obese  Extremities:  no peripheral edema.  Neurologic: Alert, oriented, able to answer questions  Skin: No lesions  Access: none    Basic Metabolic Panel: Recent Labs  Lab 01/22/21 0526 01/23/21 0517 01/24/21 0615 01/25/21 0515 01/26/21 0651  NA 139 142 148* 151* 152*  K 3.9 5.0 4.6 4.0 4.4  CL 107 109 114* 119* 121*  CO2 21* 21* 20* 23 21*  GLUCOSE 128* 216* 159* 90 98  BUN 51* 62* 67* 59* 53*  CREATININE 3.39* 3.44* 3.15* 2.74* 2.53*  CALCIUM 8.3* 8.8* 8.8* 8.9 8.8*  MG 2.2 2.4 2.3 2.3 2.2  PHOS 5.7* 7.0* 6.1* 4.8* 3.9    Liver Function Tests: Recent Labs  Lab 01/22/21 0526 01/23/21 0517 01/24/21 0615 01/25/21 0515 01/26/21 0651  ALBUMIN 2.1* 1.9* 2.3* 2.2* 2.5*   No results for input(s): LIPASE, AMYLASE in the last 168 hours. No results for input(s): AMMONIA in the last 168 hours.  CBC: Recent Labs  Lab  01/23/21 0517 01/24/21 0615 01/25/21 0515 01/26/21 0651 01/27/21 1105  WBC 10.9* 13.2* 12.2* 12.4* 8.8  NEUTROABS 7.6 10.0* 8.9* 8.7* 6.2  HGB 8.3* 9.2* 8.7* 8.8* 9.1*  HCT 27.0* 30.2* 29.0* 30.0* 30.7*  MCV 83.3 83.7 85.3 88.0 88.5  PLT 292 371 340 350 350    Cardiac Enzymes: No results for input(s): CKTOTAL, CKMB, CKMBINDEX, TROPONINI in the last 168 hours.  BNP: Invalid input(s): POCBNP  CBG: Recent Labs  Lab 01/26/21 1639 01/26/21 2237 01/27/21 0509 01/27/21 0812 01/27/21 1217  GLUCAP 201* 147* 105* 123* 155*    Microbiology: Results for orders placed or performed during the hospital encounter of 01/12/21  Blood culture (routine single)     Status: None   Collection Time: 01/12/21  9:05 AM   Specimen: BLOOD  Result Value Ref Range Status   Specimen Description BLOOD LEFT AC  Final   Special Requests   Final    BOTTLES DRAWN AEROBIC ONLY Blood Culture results may not be optimal due to an inadequate volume of blood received in culture bottles   Culture   Final    NO GROWTH 5 DAYS Performed at [facility], [address] [place], [place] [address]    Report Status 01/17/2021 FINAL  Final  SARS Coronavirus 2 by RT PCR (hospital order, performed in [facility] hospital lab) Nasopharyngeal Nasopharyngeal Swab     Status: Abnormal  Collection Time: 01/12/21  9:36 AM   Specimen: Nasopharyngeal Swab  Result Value Ref Range Status   SARS Coronavirus 2 POSITIVE (A) NEGATIVE Final    Comment: RESULT CALLED TO, READ BACK BY AND VERIFIED WITH: ANNA JASPER ON 01/12/21 AT 1111 SDR (NOTE) SARS-CoV-2 target nucleic acids are DETECTED  SARS-CoV-2 RNA is generally detectable in upper respiratory specimens  during the acute phase of infection.  Positive results are indicative  of the presence of the identified virus, but do not rule out bacterial infection or co-infection with other pathogens not detected by the test.  Clinical correlation with patient history and   other diagnostic information is necessary to determine patient infection status.  The expected result is negative.  Fact Sheet for Patients:   BoilerBrush.com.cy   Fact Sheet for Healthcare Providers:   https://pope.com/    This test is not yet approved or cleared by the [place] FDA and  has been authorized for detection and/or diagnosis of SARS-CoV-2 by FDA under an Emergency Use Authorization (EUA).  This EUA will remain in effect (meaning this tes t can be used) for the duration of  the COVID-19 declaration under Section 564(b)(1) of the Act, 21 U.S.C. section 360-bbb-3(b)(1), unless the authorization is terminated or revoked sooner.  Performed at [facility], [address] [place], [place] [address]   Culture, blood (single)     Status: None   Collection Time: 01/12/21  1:08 PM   Specimen: BLOOD  Result Value Ref Range Status   Specimen Description BLOOD BLOOD RIGHT HAND  Final   Special Requests   Final    BOTTLES DRAWN AEROBIC AND ANAEROBIC Blood Culture adequate volume   Culture   Final    NO GROWTH 5 DAYS Performed at [facility], [address] [place], [place] [address]    Report Status 01/17/2021 FINAL  Final  MRSA PCR Screening     Status: None   Collection Time: 01/12/21  7:22 PM   Specimen: Nasopharyngeal  Result Value Ref Range Status   MRSA by PCR NEGATIVE NEGATIVE Final    Comment:        The GeneXpert MRSA Assay (FDA approved for NASAL specimens only), is one component of a comprehensive MRSA colonization surveillance program. It is not intended to diagnose MRSA infection nor to guide or monitor treatment for MRSA infections. Performed at [facility], [address] [place], [place] [address]   Culture, respiratory (non-expectorated)     Status: None   Collection Time: 01/13/21  3:04 PM   Specimen: Tracheal Aspirate; Respiratory  Result Value Ref Range  Status   Specimen Description   Final    TRACHEAL ASPIRATE Performed at [facility], [address] [place], [place] [address]    Special Requests   Final    NONE Performed at [facility], [address] [place], [place] [address]    Gram Stain   Final    ABUNDANT WBC PRESENT, PREDOMINANTLY PMN MODERATE GRAM POSITIVE COCCI Performed at [facility], [address], [place], [place] [address]    Culture FEW STAPHYLOCOCCUS AUREUS  Final   Report Status 01/16/2021 FINAL  Final   Organism ID, Bacteria STAPHYLOCOCCUS AUREUS  Final      Susceptibility   Staphylococcus aureus - MIC*    CIPROFLOXACIN <=0.5 SENSITIVE Sensitive     ERYTHROMYCIN <=0.25 SENSITIVE Sensitive     GENTAMICIN <=0.5 SENSITIVE Sensitive     OXACILLIN 0.5 SENSITIVE  Sensitive     TETRACYCLINE <=1 SENSITIVE Sensitive     VANCOMYCIN 1 SENSITIVE Sensitive     TRIMETH/SULFA <=10 SENSITIVE Sensitive     CLINDAMYCIN <=0.25 SENSITIVE Sensitive     RIFAMPIN <=0.5 SENSITIVE Sensitive     Inducible Clindamycin NEGATIVE Sensitive     * FEW STAPHYLOCOCCUS AUREUS  Culture, respiratory (non-expectorated)     Status: None   Collection Time: 01/19/21 10:41 PM   Specimen: Tracheal Aspirate; Respiratory  Result Value Ref Range Status   Specimen Description   Final    TRACHEAL ASPIRATE Performed at [facility], [address] [place], [place] [address]    Special Requests   Final    NONE Performed at [facility], [address] [place], [place] [address]    Gram Stain   Final    FEW WBC PRESENT,BOTH PMN AND MONONUCLEAR RARE BUDDING YEAST SEEN Performed at [facility], [address], [place], [place] [address]    Culture FEW CANDIDA ALBICANS  Final   Report Status 01/22/2021 FINAL  Final    Coagulation Studies: No results for input(s): LABPROT, INR in the last 72 hours.  Urinalysis: No results for input(s): COLORURINE, LABSPEC, PHURINE, GLUCOSEU, HGBUR,  BILIRUBINUR, KETONESUR, PROTEINUR, UROBILINOGEN, NITRITE, LEUKOCYTESUR in the last 72 hours.  Invalid input(s): APPERANCEUR    Imaging: No results found.   Medications:   . sodium chloride    . dextrose 100 mL/hr at 01/27/21 0624   . albuterol  1-2 puff Inhalation Q6H  . aspirin  81 mg Oral Daily  . atorvastatin  80 mg Oral Daily  . Chlorhexidine Gluconate Cloth  6 each Topical Daily  . clopidogrel  75 mg Oral Daily  . docusate sodium  100 mg Oral BID  . enoxaparin (LOVENOX) injection  0.5 mg/kg Subcutaneous Q24H  . famotidine  20 mg Oral Daily  . feeding supplement (NEPRO CARB STEADY)  237 mL Oral TID BM  . insulin aspart  0-20 Units Subcutaneous Q4H  . insulin detemir  30 Units Subcutaneous Daily  . mouth rinse  15 mL Mouth Rinse BID  . methylPREDNISolone (SOLU-MEDROL) injection  40 mg Intravenous Daily  . metoprolol tartrate  25 mg Oral BID  . multivitamin  1 tablet Oral QHS  . multivitamin with minerals  1 tablet Oral Daily  . polyethylene glycol  17 g Oral Daily  . sodium chloride flush  3 mL Intravenous Q12H   sodium chloride, acetaminophen (TYLENOL) oral liquid 160 mg/5 mL, dextromethorphan-guaiFENesin, dextrose, hydrALAZINE, lip balm, metoprolol tartrate, ondansetron **OR** ondansetron (ZOFRAN) IV, ondansetron (ZOFRAN) IV, polyethylene glycol, sodium chloride flush  Assessment/ Plan:  Mr. KAELOB PERSKY is a 30 y.o. black male with asthma, with no other past medical history, and no outpatient medications who was admitted to [facility]/12/2021 for ARDS (adult respiratory distress syndrome) (HCC) [J80] Acute respiratory failure with hypoxia (HCC) [J96.01] Acute hypoxemic respiratory failure due to COVID-19 (HCC) [U07.1, J96.01] Hyperosmolar hyperglycemic state (HHS) (HCC) [E11.00, E11.65] Pneumonia due to COVID-19 virus [U07.1, J12.82]   Started on CRRT on admission. Discontinued on 2/9. Hemodynamically stable. Has not required intermittent hemodialysis this admission.    1. Acute kidney injury: with no known creatinine baseline.  Continued improvement- Crt 2.53 Will continue to monitor No need for dialysis at this time Discontinue Foley  2. Acute respiratory failure:  Currently on room air  3. 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 Hemoglobin A1c of 13.6%.  Glucose levels controlled on current treament  4. Anemia with kidney failure:  Improving-Hgb-9.1. normocytic.  -Currently prescribed 2 multivitamins -d/c RENA Vit -TIBC-232 -iron 36  5. Hypernatremia:  - will check sodium levels in the morning - IVF- D5 @100  ml/hr   LOS: 15 Shantelle Breeze 2/16/20222:41 PM

## 2021-01-27 NOTE — Progress Notes (Signed)
PROGRESS NOTE    Erik [name]  NWG:[id number] DOB: [birth year] DOA: 01/12/2021 PCP: Pcp, No   Brief Narrative:   30 yo morbidly obese male with Asthma now with progressive multiorgan failure with COVID 19 pneumonia and ARDS with severe acidosis and severe hypoxia with ARDS with acute renal failure; now extubated and off renal replacement therapy.  Initially due to severe respiratory failure she was intubated was found to be in acute kidney injury started on CRRT.  She was found to be COVID-19 positive requiring treatment including remdesivir and steroids.  She was not deemed to be candidate for baricitinib/Actemra.  On 2/8C suffered from acute MI/STEMI therefore taken for left heart catheterization 2/9.  Successfully extubated on 2/11.  Assessment & Plan:   Principal Problem:   Acute hypoxemic respiratory failure due to COVID-19 [facility]) Active Problems:   DKA (diabetic ketoacidosis) (HCC)   Obesity, Class III, BMI 40-49.9 (morbid obesity) (HCC)   Acute metabolic encephalopathy   ARDS (adult respiratory distress syndrome) (HCC)   ST elevation myocardial infarction (STEMI) (HCC)   Acute respiratory failure with hypoxia (HCC)     Acute hypoxemic respiratory failure due to severe COVID-19 pneumonia/ARDS 2/1 Rquire Mechanical ventilation  Successfully extubated 01/22/21 -currently on room air at rest Completed remdesivir Due to suspected CAP no baricitinib/actemra Completed antibiotics Continue steroid taper.  Currently on Solu-Medrol 40 mg IV daily Labs downtrending appropriately  AKI with acute renal failure, resolving -Initially patient was on CRRT which was discontinued on 2/9.  Currently hemodynamically stable.  Continue to monitor urine output and renal function. -Creatinine 2.53.  Discontinue Foley catheter -No baseline renal function available -Nephrology following  Acute inferior STEMI with associated diastolic heart failure and Vtach -Cardiology team is following.  Status  post left heart cath 2/9-no significant CAD seen.  Suspect myocarditis.    Eventually will need repeat echocardiogram. -Echocardiogram 2/9-EF 60 to 65% -Check A1c, lipid panel  Ambulatory dysfunction, profound  -Secondary to acute illness.  PT/OT.  Morbid obesity, high risk for OSA (undiagnosed).  Will certainly impact respiratory mechanics, ventilator weaning Suspect will need to consider additional PEEP  Profound hyperglycemia secondary to newly diagnosed uncontrolled diabetes - insulin dependent -Hemoglobin A1c- -Insulin sliding scale and Accu-Chek -Diabetic coordinator following  PT-CIR   DVT prophylaxis: Subcu heparin Code Status: Full code Family Communication: Mom present at bedside  Status is: Inpatient  Remains inpatient appropriate because:Inpatient level of care appropriate due to severity of illness   Dispo: The patient is from: Home              Anticipated d/c is to: SNF              Anticipated d/c date is: 3 days              Patient currently is not medically stable to d/c.   Difficult to place patient No       Body mass index is 48.69 kg/m.       Subjective: Seen and examined at bedside, no complaints at this time.  Would like his diet to be advanced  Review of Systems Otherwise negative except as per HPI, including: General: Denies fever, chills, night sweats or unintended weight loss. Resp: Denies cough, wheezing, shortness of breath. Cardiac: Denies chest pain, palpitations, orthopnea, paroxysmal nocturnal dyspnea. GI: Denies abdominal pain, nausea, vomiting, diarrhea or constipation GU: Denies dysuria, frequency, hesitancy or incontinence MS: Denies muscle aches, joint pain or swelling Neuro: Denies headache, neurologic deficits (focal weakness, numbness,  tingling), abnormal gait Psych: Denies anxiety, depression, SI/HI/AVH Skin: Denies new rashes or lesions ID: Denies sick contacts, exotic exposures,  travel  Examination:  General exam: Appears calm and comfortable, morbidly obese Respiratory system: Clear to auscultation. Respiratory effort normal. Cardiovascular system: S1 & S2 heard, RRR. No JVD, murmurs, rubs, gallops or clicks. No pedal edema. Gastrointestinal system: Abdomen is nondistended, soft and nontender. No organomegaly or masses felt. Normal bowel sounds heard. Central nervous system: Alert and oriented. No focal neurological deficits. Extremities: Symmetric 5 x 5 power. Skin: No rashes, lesions or ulcers Psychiatry: Judgement and insight appear normal. Mood & affect appropriate.  Foley catheter in place, will be removed   Objective: Vitals:   01/27/21 0451 01/27/21 0500 01/27/21 0537 01/27/21 0814  BP: (!) 143/87 (!) 143/87  (!) 160/101  Pulse: 97 97  98  Resp: 16 16  16   Temp: 99.5 F (37.5 C) 99.5 F (37.5 C)  99.3 F (37.4 C)  TempSrc: Oral Oral  Oral  SpO2: 97%   96%  Weight:   (!) 162.8 kg   Height:        Intake/Output Summary (Last 24 hours) at 01/27/2021 0859 Last data filed at 01/27/2021 01/29/2021 Gross per 24 hour  Intake 1905.5 ml  Output 2450 ml  Net -544.5 ml   Filed Weights   01/25/21 0412 01/26/21 0449 01/27/21 0537  Weight: (!) 186.9 kg (!) 165.1 kg (!) 162.8 kg     Data Reviewed:   CBC: Recent Labs  Lab 01/22/21 0526 01/23/21 0517 01/24/21 0615 01/25/21 0515 01/26/21 0651  WBC 13.7* 10.9* 13.2* 12.2* 12.4*  NEUTROABS 8.4* 7.6 10.0* 8.9* 8.7*  HGB 8.0* 8.3* 9.2* 8.7* 8.8*  HCT 25.8* 27.0* 30.2* 29.0* 30.0*  MCV 83.5 83.3 83.7 85.3 88.0  PLT 239 292 371 340 350   Basic Metabolic Panel: Recent Labs  Lab 01/22/21 0526 01/23/21 0517 01/24/21 0615 01/25/21 0515 01/26/21 0651  NA 139 142 148* 151* 152*  K 3.9 5.0 4.6 4.0 4.4  CL 107 109 114* 119* 121*  CO2 21* 21* 20* 23 21*  GLUCOSE 128* 216* 159* 90 98  BUN 51* 62* 67* 59* 53*  CREATININE 3.39* 3.44* 3.15* 2.74* 2.53*  CALCIUM 8.3* 8.8* 8.8* 8.9 8.8*  MG 2.2 2.4 2.3  2.3 2.2  PHOS 5.7* 7.0* 6.1* 4.8* 3.9   GFR: Estimated Creatinine Clearance: 68.1 mL/min (A) (by C-G formula based on SCr of 2.53 mg/dL (H)). Liver Function Tests: Recent Labs  Lab 01/22/21 0526 01/23/21 0517 01/24/21 0615 01/25/21 0515 01/26/21 0651  ALBUMIN 2.1* 1.9* 2.3* 2.2* 2.5*   No results for input(s): LIPASE, AMYLASE in the last 168 hours. No results for input(s): AMMONIA in the last 168 hours. Coagulation Profile: No results for input(s): INR, PROTIME in the last 168 hours. Cardiac Enzymes: No results for input(s): CKTOTAL, CKMB, CKMBINDEX, TROPONINI in the last 168 hours. BNP (last 3 results) No results for input(s): PROBNP in the last 8760 hours. HbA1C: No results for input(s): HGBA1C in the last 72 hours. CBG: Recent Labs  Lab 01/26/21 1227 01/26/21 1639 01/26/21 2237 01/27/21 0509 01/27/21 0812  GLUCAP 138* 201* 147* 105* 123*   Lipid Profile: No results for input(s): CHOL, HDL, LDLCALC, TRIG, CHOLHDL, LDLDIRECT in the last 72 hours. Thyroid Function Tests: No results for input(s): TSH, T4TOTAL, FREET4, T3FREE, THYROIDAB in the last 72 hours. Anemia Panel: Recent Labs    01/25/21 0515 01/26/21 0651  FERRITIN 479* 566*  TIBC  --  232*  IRON  --  36*   Sepsis Labs: No results for input(s): PROCALCITON, LATICACIDVEN in the last 168 hours.  Recent Results (from the past 240 hour(s))  Culture, respiratory (non-expectorated)     Status: None   Collection Time: 01/19/21 10:41 PM   Specimen: Tracheal Aspirate; Respiratory  Result Value Ref Range Status   Specimen Description   Final    TRACHEAL ASPIRATE Performed at [facility], [address], [place], [place] [address]    Special Requests   Final    NONE Performed at [facility], [address] [place], [place] [address]    Gram Stain   Final    FEW WBC PRESENT,BOTH PMN AND MONONUCLEAR RARE BUDDING YEAST SEEN Performed at [facility], [address],  [place], [place] [address]    Culture FEW CANDIDA ALBICANS  Final   Report Status 01/22/2021 FINAL  Final         Radiology Studies: No results found.      Scheduled Meds: . aspirin  81 mg Oral Daily  . atorvastatin  80 mg Oral Daily  . Chlorhexidine Gluconate Cloth  6 each Topical Daily  . docusate sodium  100 mg Oral BID  . enoxaparin (LOVENOX) injection  0.5 mg/kg Subcutaneous Q24H  . famotidine  20 mg Oral Daily  . feeding supplement (NEPRO CARB STEADY)  237 mL Oral TID BM  . insulin aspart  0-20 Units Subcutaneous Q4H  . insulin detemir  0.5 Units/kg (Ideal) Subcutaneous QHS  . mouth rinse  15 mL Mouth Rinse BID  . methylPREDNISolone (SOLU-MEDROL) injection  40 mg Intravenous Daily  . metoprolol tartrate  12.5 mg Oral BID  . multivitamin  1 tablet Oral QHS  . multivitamin with minerals  1 tablet Oral Daily  . polyethylene glycol  17 g Oral Daily  . sodium chloride flush  3 mL Intravenous Q12H   Continuous Infusions: . sodium chloride    . dextrose 100 mL/hr at 01/27/21 0624     LOS: 15 days   Time spent= 35 mins    Ankit Joline Maxcy, MD [facility]  If 7PM-7AM, please contact night-coverage  01/27/2021, 8:59 AM

## 2021-01-27 NOTE — Progress Notes (Signed)
Occupational Therapy Treatment Patient Details Name: Erik [name] MRN: [id number] DOB: Jan 03, 1991 Today's Date: 01/27/2021    History of present illness 30 year old morbidly obese male with history of asthma presented to the emergency room on 01/13/2020 with severe acute hypoxic respiratory failure/ARDS and severe metabolic acidosis associated with acute renal failure in the setting of COVID-19 pneumonia. Patient was emergently intubated on 01/13/2020. Hospital course complicated by renal failure requiring CRRT. Also had acute MI on 01/19/2021. Failed weaning trials up until 01/22/2020. Was successfully extubated 01/23/21.   OT comments  Pt seen for OT treatment on this date. Upon arrival to room pt awake, seated upright in bed with mom at bedside. Pt agreeable to tx. Pt is making good progress toward goals and was able to participate in bed mobility and seated balance at EOB for ~54min, requiring intermittent MOD A for dynamic sitting balance and increased assistance in setting of fatigue. While seated EOB, pt was able to perform oral care with MIN A and participate in seated UE/LE therex. Pt expressed goal to be able to sit in chair; discussed using hoyer lift with COVID precautions with RN. Pt continues to benefit from skilled OT services to maximize return to PLOF and minimize risk of future falls, injury, caregiver burden, and readmission. Will continue to follow POC. Discharge recommendation remains appropriate.    Follow Up Recommendations  CIR    Equipment Recommendations  Other (comment) (defer to next venue of care)       Precautions / Restrictions Precautions Precautions: Fall Restrictions Weight Bearing Restrictions: No       Mobility Bed Mobility Overal bed mobility: Needs Assistance Bed Mobility: Supine to Sit;Sit to Supine;Rolling Rolling: Max assist;+2 for physical assistance   Supine to sit: Max assist;+2 for physical assistance Sit to supine: Max assist;+2 for physical  assistance   General bed mobility comments: Max assist +2 for all bed mobility with multimodal cues for sequencing and positioning to facilitate transfer. Increased time/effort required due to generalized weakness and pt habitus  Transfers                 General transfer comment: unsafe/unable    Balance Overall balance assessment: Needs assistance Sitting-balance support: Feet supported;Single extremity supported Sitting balance-Leahy Scale: Poor Sitting balance - Comments: Fair seated balance with feet supported and unilateral UE support. Progressed to poor without LUE support on bed during UE therex/grooming, requiring intermittent mod assist for maintenance of upright positioning. Increased R posterolateral lean noted with fatigue. Postural control: Posterior lean;Left lateral lean                                 ADL either performed or assessed with clinical judgement   ADL Overall ADL's : Needs assistance/impaired     Grooming: Oral care;Minimal assistance;Sitting Grooming Details (indicate cue type and reason): Increased assistance required for bimanual tasks d/t LUE weakness; pt able to use LUE as functional assist when taking lid off toothpaste and holding toothbrush to apply toothpaste. Required physical assistance to bring cup to mouth to rinse/spit             Lower Body Dressing: Maximal assistance;Bed level Lower Body Dressing Details (indicate cue type and reason): To don/doff socks     Toileting- Clothing Manipulation and Hygiene: Maximal assistance;+2 for physical assistance;Bed level  Cognition Arousal/Alertness: Awake/alert Behavior During Therapy: WFL for tasks assessed/performed Overall Cognitive Status: Within Functional Limits for tasks assessed                                 General Comments: Pt able to follow commands and demonstrate active participation throughout session. Pt  expressed goal to be able to sit in chair; discussed using hoyer lift with COVID precautions with RN        Exercises General Exercises - Upper Extremity Shoulder Flexion: Self ROM;Both;5 reps;Seated Elbow Flexion: Self ROM;5 reps;Seated Elbow Extension: Self ROM;5 reps;Seated Other Exercises Other Exercises: Pt able to participate in bed mobility and seated balance at EOB for ~69min. Pt able to demonstrate fair balance with at least unilateral UE support, but progressed to poor without UE support. Pt able to perform self care ADL's and UE exercises with OT, as well as multiplanar reaching outside BOS with BUE and BLE therex with PT. Increased assistance required with fatigue.            Pertinent Vitals/ Pain       Pain Assessment: Faces Faces Pain Scale: Hurts a little bit Pain Location: rectal tube Pain Descriptors / Indicators: Discomfort Pain Intervention(s): Monitored during session;Repositioned         Frequency  Min 2X/week        Progress Toward Goals  OT Goals(current goals can now be found in the care plan section)  Progress towards OT goals: Progressing toward goals  Acute Rehab OT Goals Patient Stated Goal: Sit in chair OT Goal Formulation: With patient Time For Goal Achievement: 02/08/21 Potential to Achieve Goals: Good  Plan Discharge plan remains appropriate;Frequency remains appropriate    Co-evaluation    PT/OT/SLP Co-Evaluation/Treatment: Yes Reason for Co-Treatment: Complexity of the patient's impairments (multi-system involvement);For patient/therapist safety;To address functional/ADL transfers PT goals addressed during session: Mobility/safety with mobility;Strengthening/ROM;Balance OT goals addressed during session: ADL's and self-care;Strengthening/ROM      AM-PAC OT "6 Clicks" Daily Activity     Outcome Measure   Help from another person eating meals?: A Little Help from another person taking care of personal grooming?: A Lot Help  from another person toileting, which includes using toliet, bedpan, or urinal?: Total Help from another person bathing (including washing, rinsing, drying)?: A Lot Help from another person to put on and taking off regular upper body clothing?: A Lot Help from another person to put on and taking off regular lower body clothing?: A Lot 6 Click Score: 12    End of Session     OT Visit Diagnosis: Unsteadiness on feet (R26.81);Muscle weakness (generalized) (M62.81)   Activity Tolerance Patient tolerated treatment well   Patient Left in bed;with call bell/phone within reach;with bed alarm set   Nurse Communication Mobility status        Time: 1448-1856 OT Time Calculation (min): 53 min  Charges: OT General Charges $OT Visit: 1 Visit OT Treatments $Self Care/Home Management : 8-22 mins $Therapeutic Activity: 8-22 mins  Matthew Folks, OTR/L [facility] [phone]

## 2021-01-27 NOTE — Progress Notes (Signed)
Physical Therapy Treatment Patient Details Name: Erik [name] MRN: [id number] DOB: [birth year] Today's Date: 01/27/2021    History of Present Illness presented to ER secondary to progressive weakness, decreased PO intake, nausea/vomiting; admitted for management of acute hypoxic respiratory failure due to COVID-19 viral infection, DKA and metabolic encephalopathy.  Hospital course significant for emergent intubation 2/1-2/12/22; CRRT 2/3-01/20/21 via L temporary IJ dialysis catheter (R femoral removed); code STEMI with emergent cardiac cath (showing no significant coronary disease) 01/20/21 with elevated troponin associated with myocarditis.    PT Comments    Pt tolerated treatment well today. While pt continues to require max assist +2 for all bed mobility, he was able to improve quality and time of seated balance at EOB. Pt able to tolerate ~6min seated EOB for self care ADL's and exercise with OT, as well as BLE exercise and multiplanar reaching outside BOS with PT. Pt able to demonstrate fair seated balance with unilateral UE support, that progressed to poor without UE support. Increased time requiring during session for pericare and linen change. Further mobility continues to be limited secondary to generalized weakness, decreased balance, and pt habitus. Pt will continue to benefit from skilled acute PT services to address deficits for return to baseline function. Will continue to recommend CIR at DC to address deficits and improve overall safety with functional mobility prior to return home.  Of note, pt requesting to sit in recliner. Per PT perspective, pt is appropriate to be transferred to recliner with hoyer lift, but will require different chair to accommodate pt habitus. For today, pt left in bed with bed in chair position. RN notified of pt request.      Follow Up Recommendations  CIR           Precautions / Restrictions Precautions Precautions: Fall Restrictions Weight Bearing  Restrictions: No    Mobility  Bed Mobility Overal bed mobility: Needs Assistance Bed Mobility: Supine to Sit;Sit to Supine;Rolling Rolling: Max assist;+2 for physical assistance   Supine to sit: Max assist;+2 for physical assistance Sit to supine: Max assist;+2 for physical assistance   General bed mobility comments: Max assist +2 for all bed mobility with multimodal cues for sequencing and positioning to facilitate transfer. Increased time/effort required due to generalized weakness and pt habitus    Transfers                 General transfer comment: unsafe/unable  Ambulation/Gait                   Balance Overall balance assessment: Needs assistance Sitting-balance support: Feet supported;Single extremity supported Sitting balance-Leahy Scale: Poor Sitting balance - Comments: Fair seated balance with feet supported and unilateral UE support. Progressed to poor without LUE support on bed, requiring at least mod assist for maintenance of upright positioning. Increased R posterolateral lean noted with fatigue. Postural control: Posterior lean;Left lateral lean                                  Cognition Arousal/Alertness: Awake/alert Behavior During Therapy: WFL for tasks assessed/performed Overall Cognitive Status: Within Functional Limits for tasks assessed                                 General Comments: Pt able to follow commands and demonstrate active participation throughout session      Exercises Other  Exercises Other Exercises: Pt able to participate in bed mobility and seated balance at EOB for ~18min. Pt able to demonstrate fair balance with at least unilateral UE support, but progressed to poor without UE support. Pt able to perform self care ADL's and UE exercises with OT, as well as multiplanar reaching outside BOS with BUE and BLE therex with PT. Increased assistance required with fatigue. Other Exercises: Pt required  min guard for safety with PT exercises at EOB. Pt able to perform x5 reaching bil, with tactile cues for weight shift. Due to LUE weakness, pt unable to perform shoulder flexion for reaching but was able to demonstrate adequate tricep and bicep strength with mobility. Pt able to perform x10 BLE exercises including: isometric hip ADD, isometric hip ABD, marching, LAQ, and ankle pumps. Heavy posterior lean noted with LAQ and marching secondary to pt habitus and weakness.    General Comments        Pertinent Vitals/Pain Pain Assessment: Faces Faces Pain Scale: Hurts a little bit Pain Location: rectal tube Pain Descriptors / Indicators: Discomfort Pain Intervention(s): Monitored during session;Repositioned           PT Goals (current goals can now be found in the care plan section) Acute Rehab PT Goals Patient Stated Goal: to get stronger PT Goal Formulation: With patient Time For Goal Achievement: 02/07/21 Potential to Achieve Goals: Good Additional Goals Additional Goal #1: Assess and establish goals for OOB mobility as appropriate. Progress towards PT goals: Progressing toward goals    Frequency    Min 2X/week      PT Plan Current plan remains appropriate    Co-evaluation PT/OT/SLP Co-Evaluation/Treatment: Yes Reason for Co-Treatment: Complexity of the patient's impairments (multi-system involvement);For patient/therapist safety;To address functional/ADL transfers PT goals addressed during session: Mobility/safety with mobility;Strengthening/ROM;Balance OT goals addressed during session: ADL's and self-care;Strengthening/ROM      AM-PAC PT "6 Clicks" Mobility   Outcome Measure  Help needed turning from your back to your side while in a flat bed without using bedrails?: Total Help needed moving from lying on your back to sitting on the side of a flat bed without using bedrails?: Total Help needed moving to and from a bed to a chair (including a wheelchair)?: Total Help  needed standing up from a chair using your arms (e.g., wheelchair or bedside chair)?: Total Help needed to walk in hospital room?: Total Help needed climbing 3-5 steps with a railing? : Total 6 Click Score: 6    End of Session   Activity Tolerance: Patient tolerated treatment well;Patient limited by fatigue Patient left: in bed;with call bell/phone within reach;with bed alarm set;with family/visitor present Nurse Communication: Mobility status (new call bell and yonker) PT Visit Diagnosis: Muscle weakness (generalized) (M62.81);Difficulty in walking, not elsewhere classified (R26.2)     Time: 1448-1856 PT Time Calculation (min) (ACUTE ONLY): 52 min  Charges:  $Therapeutic Exercise: 8-22 mins $Therapeutic Activity: 8-22 mins                     Vira Blanco, PT, DPT 1:31 PM,01/27/21

## 2021-01-27 NOTE — Progress Notes (Signed)
Pt mother called and updated on plan of care. Time allowed for questions and concerns Spoke with Trinna Balloon ADN  Patient mother given approval to come see patient today She can come in for 2 hours to visit. Pt mother verbalizes an understanding on PPE and having to remain in the room. Mother very appreciative. Son updated on plan.  Call bell within reach. Will continue to monitor.

## 2021-01-27 NOTE — Progress Notes (Signed)
Speech Language Pathology Treatment: Dysphagia  Patient Details Name: Erik [name] MRN: [id number] DOB: 1991/08/04 Today's Date: 01/27/2021 Time: 1330-1430 SLP Time Calculation (min) (ACUTE ONLY): 60 min  Assessment / Plan / Recommendation Clinical Impression  Pt seen for ongoing assessment of swallowing. He appears much improved and engaged today; verbally responsive and follows instructions w/ min cues. Pt is on RA; wbc wnl. Pt is improving w/ PT/OT per notes; Pulmonary status improving per MD notes. Pt's request is for "water and jello"; he has Not been drinking the Nectar liquids nor eating much of the puree foods on current dysphagia diet. Mother present in room. Pt and Mother explained general aspiration precautions and agreed verbally to the need for following them especially sitting upright for all oral intake and taking Small, Single sips Slowly. Pt assisted w/ positioning d/t weakness then given trials of ice chips, then thin liquids VIA CUP -- min, delayed throat clearing/cough noted x2/11-12 trials total. Instructed pt on maintaining focus w/ head forward when swallowing w/ Single, Small sips. Educated on No talking and reducing distractions during po's w/ pt/Mother also. Pt HELD CUP when DRINKING for safer swallowing; NO Straws utilized for safer swallowing. No other overt clinical s/s of aspiration noted including decline in vocal quality, or change in respiratory effort. Laryngeal excursion appeared adequate though min difficult to palpate. Pt then consumed trials of soft solids moistened w/ grossly adequate bolus management though min extra Time noted for mastication of increased texture -- pt stated he was being "careful". Oral clearing achieved w/ all consistencies. Educated pt on alternating foods for ease of oral clearing. Discussed the above w/ MD who agreed w/ trial of diet upgrade w/ aspiration precautions. Recommend upgrade to Dysphagia level 3 diet (mech soft) w/ gravies added to  moisten foods; Thin liquids VIA CUP. Recommend general aspiration precautions; Pills Whole in Puree; tray setup and positioning assistance for meals - pt MUST HOLD CUP to DRINK, No Straws. ST services will continue to f/u w/ pt for toleration of diet and education as needed while admitted. NSG updated. Precautions posted at bedside. Mother and pt in agreement.    HPI HPI: Per admitting H&P " Erik [name] is a 30 y.o. male with medical history significant for not on previous prescription medications, has asthma however has not been a provider since he was a teenager.     He presents via the mother for chief concerns of weakness, poor p.o. intake, lethargy that has been ongoing for the last 2 weeks, 12/27/2020.  She denies fever at home.  She reports that he endorses nausea and vomiting.  He has had poor p.o. intake but has been drinking apple juice and orange juice" After presenting to the ER with progressive weakness, decreased PO intake, nausea/vomiting, he wasadmitted for management of acute hypoxic respiratory failure due to COVID-19 viral infection, DKA and metabolic encephalopathy.  Pt was emergently intubated 2/1, extubated 01/23/21;      SLP Plan  Continue with current plan of care       Recommendations  Diet recommendations: Dysphagia 3 (mechanical soft);Thin liquid Liquids provided via: Cup;No straw Medication Administration: Whole meds with puree (for safer swallowing) Supervision: Patient able to self feed;Intermittent supervision to cue for compensatory strategies (tray setup support) Compensations: Minimize environmental distractions;Slow rate;Small sips/bites;Lingual sweep for clearance of pocketing;Follow solids with liquid Postural Changes and/or Swallow Maneuvers: Out of bed for meals;Seated upright 90 degrees;Upright 30-60 min after meal  General recommendations:  (Dietician f/u) Oral Care Recommendations: Oral care BID;Patient independent with oral  care;Staff/trained caregiver to provide oral care Follow up Recommendations: Skilled Nursing facility;Inpatient Rehab (TBD) SLP Visit Diagnosis: Dysphagia, oropharyngeal phase (R13.12) Plan: Continue with current plan of care       GO                 Jerilynn Som, MS, CCC-SLP Speech Language Pathologist Rehab Services [phone] [facility] 01/27/2021, 4:36 PM

## 2021-01-28 LAB — CBC WITH DIFFERENTIAL/PLATELET
Abs Immature Granulocytes: 0.06 10*3/uL (ref 0.00–0.07)
Basophils Absolute: 0 10*3/uL (ref 0.0–0.1)
Basophils Relative: 0 %
Eosinophils Absolute: 0.1 10*3/uL (ref 0.0–0.5)
Eosinophils Relative: 1 %
HCT: 33.5 % — ABNORMAL LOW (ref 39.0–52.0)
Hemoglobin: 9.6 g/dL — ABNORMAL LOW (ref 13.0–17.0)
Immature Granulocytes: 1 %
Lymphocytes Relative: 10 %
Lymphs Abs: 1 10*3/uL (ref 0.7–4.0)
MCH: 25.4 pg — ABNORMAL LOW (ref 26.0–34.0)
MCHC: 28.7 g/dL — ABNORMAL LOW (ref 30.0–36.0)
MCV: 88.6 fL (ref 80.0–100.0)
Monocytes Absolute: 0.4 10*3/uL (ref 0.1–1.0)
Monocytes Relative: 5 %
Neutro Abs: 8 10*3/uL — ABNORMAL HIGH (ref 1.7–7.7)
Neutrophils Relative %: 83 %
Platelets: 337 10*3/uL (ref 150–400)
RBC: 3.78 MIL/uL — ABNORMAL LOW (ref 4.22–5.81)
RDW: 15.7 % — ABNORMAL HIGH (ref 11.5–15.5)
WBC: 9.6 10*3/uL (ref 4.0–10.5)
nRBC: 0 % (ref 0.0–0.2)

## 2021-01-28 LAB — COMPREHENSIVE METABOLIC PANEL
ALT: 25 U/L (ref 0–44)
AST: 29 U/L (ref 15–41)
Albumin: 2.6 g/dL — ABNORMAL LOW (ref 3.5–5.0)
Alkaline Phosphatase: 70 U/L (ref 38–126)
Anion gap: 10 (ref 5–15)
BUN: 39 mg/dL — ABNORMAL HIGH (ref 6–20)
CO2: 24 mmol/L (ref 22–32)
Calcium: 9.2 mg/dL (ref 8.9–10.3)
Chloride: 116 mmol/L — ABNORMAL HIGH (ref 98–111)
Creatinine, Ser: 2.35 mg/dL — ABNORMAL HIGH (ref 0.61–1.24)
GFR, Estimated: 37 mL/min — ABNORMAL LOW (ref >60)
Glucose, Bld: 260 mg/dL — ABNORMAL HIGH (ref 70–99)
Potassium: 4.6 mmol/L (ref 3.5–5.1)
Sodium: 150 mmol/L — ABNORMAL HIGH (ref 135–145)
Total Bilirubin: 0.4 mg/dL (ref 0.3–1.2)
Total Protein: 6.9 g/dL (ref 6.5–8.1)

## 2021-01-28 LAB — BRAIN NATRIURETIC PEPTIDE: B Natriuretic Peptide: 38.7 pg/mL (ref 0.0–100.0)

## 2021-01-28 LAB — GLUCOSE, CAPILLARY
Glucose-Capillary: 116 mg/dL — ABNORMAL HIGH (ref 70–99)
Glucose-Capillary: 119 mg/dL — ABNORMAL HIGH (ref 70–99)
Glucose-Capillary: 143 mg/dL — ABNORMAL HIGH (ref 70–99)
Glucose-Capillary: 217 mg/dL — ABNORMAL HIGH (ref 70–99)
Glucose-Capillary: 234 mg/dL — ABNORMAL HIGH (ref 70–99)
Glucose-Capillary: 236 mg/dL — ABNORMAL HIGH (ref 70–99)

## 2021-01-28 LAB — C-REACTIVE PROTEIN: CRP: 0.7 mg/dL (ref <1.0)

## 2021-01-28 LAB — MAGNESIUM: Magnesium: 2.1 mg/dL (ref 1.7–2.4)

## 2021-01-28 LAB — HEMOGLOBIN A1C
Hgb A1c MFr Bld: 11.8 % — ABNORMAL HIGH (ref 4.8–5.6)
Mean Plasma Glucose: 292 mg/dL

## 2021-01-28 LAB — D-DIMER, QUANTITATIVE: D-Dimer, Quant: 2.5 ug/mL-FEU — ABNORMAL HIGH (ref 0.00–0.50)

## 2021-01-28 LAB — FERRITIN: Ferritin: 393 ng/mL — ABNORMAL HIGH (ref 24–336)

## 2021-01-28 MED ORDER — ENOXAPARIN SODIUM 80 MG/0.8ML ~~LOC~~ SOLN
0.5000 mg/kg | SUBCUTANEOUS | Status: DC
  Filled 2021-01-28: qty 1.6

## 2021-01-28 MED ORDER — PREDNISONE 20 MG PO TABS
40.0000 mg | ORAL_TABLET | Freq: Every day | ORAL | Status: AC
  Administered 2021-01-29 – 2021-02-01 (×4): 40 mg via ORAL
  Filled 2021-01-28 (×5): qty 2

## 2021-01-28 MED ORDER — ENOXAPARIN SODIUM 100 MG/ML ~~LOC~~ SOLN
0.5000 mg/kg | SUBCUTANEOUS | Status: DC
  Administered 2021-01-28 – 2021-02-02 (×6): 82.5 mg via SUBCUTANEOUS
  Filled 2021-01-28 (×8): qty 1

## 2021-01-28 NOTE — Progress Notes (Signed)
[facility]  ROUNDING NOTE   Subjective:   Patient seen today resting in bed He alert and oriented Speaks with a stronger voice and able to answer questions He is able to eat, denies nausea No breathing difficulties or discomfort  UOP 1.3L D5 @100  ml/hr  Objective:  Vital signs in last 24 hours:  Temp:  [98.4 F (36.9 C)-99.6 F (37.6 C)] 98.9 F (37.2 C) (02/17 1218) Pulse Rate:  [100-104] 104 (02/17 1218) Resp:  [15-20] 20 (02/17 1218) BP: (140-146)/(86-101) 146/86 (02/17 1218) SpO2:  [95 %-96 %] 95 % (02/17 1218)  Weight change:  Filed Weights   01/25/21 0412 01/26/21 0449 01/27/21 0537  Weight: (!) 186.9 kg (!) 165.1 kg (!) 162.8 kg    Intake/Output: I/O last 3 completed shifts: In: 2874.5 [I.V.:2874.5] Out: 2250 [Urine:1950; Stool:300]   Intake/Output this shift:  No intake/output data recorded.  Physical Exam: General: NAD  Head: Normocephalic, oral mucosa dry  Eyes: Anicteric  Neck:  trachea midline  Lungs:  Rhonchi-left base  Heart: tachycardia  Abdomen:  Soft, obese  Extremities:  no peripheral edema.  Neurologic: Alert, oriented, able to answer questions  Skin: No lesions  Access: none    Basic Metabolic Panel: Recent Labs  Lab 01/22/21 0526 01/23/21 0517 01/24/21 0615 01/25/21 0515 01/26/21 0651 01/28/21 1050  NA 139 142 148* 151* 152* 150*  K 3.9 5.0 4.6 4.0 4.4 4.6  CL 107 109 114* 119* 121* 116*  CO2 21* 21* 20* 23 21* 24  GLUCOSE 128* 216* 159* 90 98 260*  BUN 51* 62* 67* 59* 53* 39*  CREATININE 3.39* 3.44* 3.15* 2.74* 2.53* 2.35*  CALCIUM 8.3* 8.8* 8.8* 8.9 8.8* 9.2  MG 2.2 2.4 2.3 2.3 2.2 2.1  PHOS 5.7* 7.0* 6.1* 4.8* 3.9  --     Liver Function Tests: Recent Labs  Lab 01/23/21 0517 01/24/21 0615 01/25/21 0515 01/26/21 0651 01/28/21 1050  AST  --   --   --   --  29  ALT  --   --   --   --  25  ALKPHOS  --   --   --   --  70  BILITOT  --   --   --   --  0.4  PROT  --   --   --   --  6.9  ALBUMIN 1.9*  2.3* 2.2* 2.5* 2.6*   No results for input(s): LIPASE, AMYLASE in the last 168 hours. No results for input(s): AMMONIA in the last 168 hours.  CBC: Recent Labs  Lab 01/24/21 0615 01/25/21 0515 01/26/21 0651 01/27/21 1105 01/28/21 1050  WBC 13.2* 12.2* 12.4* 8.8 9.6  NEUTROABS 10.0* 8.9* 8.7* 6.2 8.0*  HGB 9.2* 8.7* 8.8* 9.1* 9.6*  HCT 30.2* 29.0* 30.0* 30.7* 33.5*  MCV 83.7 85.3 88.0 88.5 88.6  PLT 371 340 350 350 337    Cardiac Enzymes: No results for input(s): CKTOTAL, CKMB, CKMBINDEX, TROPONINI in the last 168 hours.  BNP: Invalid input(s): POCBNP  CBG: Recent Labs  Lab 01/27/21 2153 01/28/21 0007 01/28/21 0531 01/28/21 0827 01/28/21 1215  GLUCAP 112* 116* 119* 143* 234*    Microbiology: Results for orders placed or performed during the hospital encounter of 01/12/21  Blood culture (routine single)     Status: None   Collection Time: 01/12/21  9:05 AM   Specimen: BLOOD  Result Value Ref Range Status   Specimen Description BLOOD LEFT [facility]  Final   Special Requests   Final  BOTTLES DRAWN AEROBIC ONLY Blood Culture results may not be optimal due to an inadequate volume of blood received in culture bottles   Culture   Final    NO GROWTH 5 DAYS Performed at [facility], [address] [place], [place] [address]    Report Status 01/17/2021 FINAL  Final  SARS Coronavirus 2 by RT PCR (hospital order, [facility]) Nasopharyngeal Nasopharyngeal Swab     Status: Abnormal   Collection Time: 01/12/21  9:36 AM   Specimen: Nasopharyngeal Swab  Result Value Ref Range Status   SARS Coronavirus 2 POSITIVE (A) NEGATIVE Final    Comment: RESULT CALLED TO, READ BACK BY AND VERIFIED WITH: ANNA JASPER ON 01/12/21 AT 1111 SDR (NOTE) SARS-CoV-2 target nucleic acids are DETECTED  SARS-CoV-2 RNA is generally detectable in upper respiratory specimens  during the acute phase of infection.  Positive results are indicative  of the presence of  the identified virus, but do not rule out bacterial infection or co-infection with other pathogens not detected by the test.  Clinical correlation with patient history and  other diagnostic information is necessary to determine patient infection status.  The expected result is negative.  Fact Sheet for Patients:   BoilerBrush.com.cyhttps://www.fda.gov/media/136312/download   Fact Sheet for Healthcare Providers:   https://pope.com/https://www.fda.gov/media/136313/download    This test is not yet approved or cleared by the [place] FDA and  has been authorized for detection and/or diagnosis of SARS-CoV-2 by FDA under an Emergency Use Authorization (EUA).  This EUA will remain in effect (meaning this tes t can be used) for the duration of  the COVID-19 declaration under Section 564(b)(1) of the Act, 21 U.S.C. section 360-bbb-3(b)(1), unless the authorization is terminated or revoked sooner.  Performed at [facility], [address] [place], [place] [address]   Culture, blood (single)     Status: None   Collection Time: 01/12/21  1:08 PM   Specimen: BLOOD  Result Value Ref Range Status   Specimen Description BLOOD BLOOD RIGHT HAND  Final   Special Requests   Final    BOTTLES DRAWN AEROBIC AND ANAEROBIC Blood Culture adequate volume   Culture   Final    NO GROWTH 5 DAYS Performed at [facility], [address] [place], [place] [address]    Report Status 01/17/2021 FINAL  Final  MRSA PCR Screening     Status: None   Collection Time: 01/12/21  7:22 PM   Specimen: Nasopharyngeal  Result Value Ref Range Status   MRSA by PCR NEGATIVE NEGATIVE Final    Comment:        The GeneXpert MRSA Assay (FDA approved for NASAL specimens only), is one component of a comprehensive MRSA colonization surveillance program. It is not intended to diagnose MRSA infection nor to guide or monitor treatment for MRSA infections. Performed at [facility], [address] [place], [place]  [address]   Culture, respiratory (non-expectorated)     Status: None   Collection Time: 01/13/21  3:04 PM   Specimen: Tracheal Aspirate; Respiratory  Result Value Ref Range Status   Specimen Description   Final    TRACHEAL ASPIRATE Performed at [facility], [address] [place], [place] [address]    Special Requests   Final    NONE Performed at [facility], [address] [place], [place] [address]    Gram Stain   Final    ABUNDANT WBC PRESENT, PREDOMINANTLY PMN MODERATE GRAM POSITIVE  COCCI Performed at [facility], [address] [place], [place] [address]    Culture FEW STAPHYLOCOCCUS AUREUS  Final   Report Status 01/16/2021 FINAL  Final   Organism ID, Bacteria STAPHYLOCOCCUS AUREUS  Final      Susceptibility   Staphylococcus aureus - MIC*    CIPROFLOXACIN <=0.5 SENSITIVE Sensitive     ERYTHROMYCIN <=0.25 SENSITIVE Sensitive     GENTAMICIN <=0.5 SENSITIVE Sensitive     OXACILLIN 0.5 SENSITIVE Sensitive     TETRACYCLINE <=1 SENSITIVE Sensitive     VANCOMYCIN 1 SENSITIVE Sensitive     TRIMETH/SULFA <=10 SENSITIVE Sensitive     CLINDAMYCIN <=0.25 SENSITIVE Sensitive     RIFAMPIN <=0.5 SENSITIVE Sensitive     Inducible Clindamycin NEGATIVE Sensitive     * FEW STAPHYLOCOCCUS AUREUS  Culture, respiratory (non-expectorated)     Status: None   Collection Time: 01/19/21 10:41 PM   Specimen: Tracheal Aspirate; Respiratory  Result Value Ref Range Status   Specimen Description   Final    TRACHEAL ASPIRATE Performed at [facility], [address] [place], [place] [address]    Special Requests   Final    NONE Performed at [facility], [address] [place], [place] [address]    Gram Stain   Final    FEW WBC PRESENT,BOTH PMN AND MONONUCLEAR RARE BUDDING YEAST SEEN Performed at [facility], [address], [place], [place] [address]    Culture FEW CANDIDA ALBICANS  Final   Report Status 01/22/2021 FINAL  Final     Coagulation Studies: No results for input(s): LABPROT, INR in the last 72 hours.  Urinalysis: No results for input(s): COLORURINE, LABSPEC, PHURINE, GLUCOSEU, HGBUR, BILIRUBINUR, KETONESUR, PROTEINUR, UROBILINOGEN, NITRITE, LEUKOCYTESUR in the last 72 hours.  Invalid input(s): APPERANCEUR    Imaging: No results found.   Medications:   . sodium chloride     . albuterol  1-2 puff Inhalation Q6H  . aspirin  81 mg Oral Daily  . atorvastatin  80 mg Oral Daily  . Chlorhexidine Gluconate Cloth  6 each Topical Daily  . clopidogrel  75 mg Oral Daily  . docusate sodium  100 mg Oral BID  . enoxaparin (LOVENOX) injection  0.5 mg/kg Subcutaneous Q24H  . famotidine  20 mg Oral Daily  . feeding supplement (NEPRO CARB STEADY)  237 mL Oral TID BM  . insulin aspart  0-20 Units Subcutaneous Q4H  . insulin detemir  30 Units Subcutaneous Daily  . mouth rinse  15 mL Mouth Rinse BID  . metoprolol tartrate  25 mg Oral BID  . multivitamin with minerals  1 tablet Oral Daily  . polyethylene glycol  17 g Oral Daily  . [START ON 01/29/2021] predniSONE  40 mg Oral Q breakfast  . sodium chloride flush  3 mL Intravenous Q12H   sodium chloride, acetaminophen (TYLENOL) oral liquid 160 mg/5 mL, dextromethorphan-guaiFENesin, dextrose, hydrALAZINE, lip balm, metoprolol tartrate, ondansetron **OR** ondansetron (ZOFRAN) IV, ondansetron (ZOFRAN) IV, polyethylene glycol, sodium chloride flush  Assessment/ Plan:  Mr. LOVELL NUTTALL is a 30 y.o. black male with asthma, with no other past medical history, and no outpatient medications who was admitted to [facility] on 01/12/2021 for ARDS (adult respiratory distress syndrome) (HCC) [J80] Acute respiratory failure with hypoxia (HCC) [J96.01] Acute hypoxemic respiratory failure due to COVID-19 (HCC) [U07.1, J96.01] Hyperosmolar hyperglycemic state (HHS) (HCC) [E11.00, E11.65] Pneumonia due to COVID-19 virus [U07.1, J12.82]   Started on CRRT on admission. Discontinued on  2/9. Hemodynamically stable. Has not required intermittent hemodialysis this admission.   1. Acute kidney injury: with no known creatinine baseline.  Continued improvement- Crt 2.35 Will continue to monitor No need for dialysis at this time  2. Acute respiratory failure:  Currently on room air  3. 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 Hemoglobin A1c of 13.6%. Glucose levels controlled on current treament  4. Anemia with kidney failure:  Continued improvement -Hgb-9.6.   5. Hypernatremia:  - improved NA-150 -d/c IVF -eating well   LOS: 16 Shantelle Breeze 2/17/20222:06 PM

## 2021-01-28 NOTE — Progress Notes (Addendum)
Inpatient Diabetes Program Recommendations  AACE/ADA: New Consensus Statement on Inpatient Glycemic Control (2015)  Target Ranges:  Prepandial:   less than 140 mg/dL      Peak postprandial:   less than 180 mg/dL (1-2 hours)      Critically ill patients:  140 - 180 mg/dL   Results for CALISTRO, RAUF (MRN [id number]) as of 01/28/2021 12:11  Ref. Range 01/12/2021 13:08  Hemoglobin A1C Latest Ref Range: 4.8 - 5.6 % 13.6 (H)   Results for ZHAIRE, LOCKER (MRN [id number]) as of 01/28/2021 12:11  Ref. Range 01/27/2021 21:53 01/28/2021 00:07 01/28/2021 05:31 01/28/2021 08:27  Glucose-Capillary Latest Ref Range: 70 - 99 mg/dL 112 (H) 116 (H) 119 (H) 143 (H)    Admit with: DKA/ New Diagnosis DM/ COVID+/ ARDS/ Acute Renal Failure   Current Orders: Levemir 30 units Daily      Novolog Resistant Correction Scale/ SSI (0-20 units) Q4 hours  Solumedrol 40 mg daily    Met w/ pt at bedside today to discuss new diagnosis of diabetes/ admission DKA/ etc.  Pt A&O and able to hold conversation.  Having some weakness in his left arm.    Spoke with pt about new diagnosis.  Discussed with pt that his strong family history of diabetes and current weight of 162.8 kg likely predisposed pt to development of diabetes.  Also discussed with pt that COVID likely exacerbated his blood sugar levels and likely was the cause of the DKA.  Discussed with pt diagnosis of DKA (pathophysiology), treatment of DKA, lab results, and transition plan to SQ insulin regimen. Discussed A1C results with pt and explained what an A1C is, basic pathophysiology of DM Type 2, basic home care, basic diabetes diet nutrition principles, importance of checking CBGs and maintaining good CBG control to prevent long-term and short-term complications.  Also reviewed blood sugar goals and A1c goals for home.    RNs to provide ongoing basic DM education at bedside with this patient.  Have ordered educational booklet, insulin starter kit.  Have also placed RD  consult for DM diet education for this patient.  Also showed pt Insulin pen and allowed pt to provide return demonstration with the insulin pen with moderate prompting.  Have asked RNs to allow pt to start giving his own insulin for practice.  Mom might also need to be involved in teaching as pt lives with Mom and will be returning to [facility] home after d/c from [facility].  Attempted to call Mom to discuss all of the above.  Got voicemail and left message for Mom to call me back when she can.  Will re-attempt to call Mom again tomorrow 02/18.    --Will follow patient during hospitalization--  Wyn Quaker RN, MSN, CDE Diabetes Coordinator Inpatient Glycemic Control Team Team Pager: [phone] (8a-5p)

## 2021-01-28 NOTE — Progress Notes (Signed)
PROGRESS NOTE    Erik [name]  IRJ:[id number] DOB: 1991/08/24 DOA: 01/12/2021 PCP: Pcp, No   Brief Narrative:   30 yo morbidly obese male with Asthma now with progressive multiorgan failure with COVID 19 pneumonia and ARDS with severe acidosis and severe hypoxia with ARDS with acute renal failure; now extubated and off renal replacement therapy.  Initially due to severe respiratory failure she was intubated was found to be in acute kidney injury started on CRRT.  She was found to be COVID-19 positive requiring treatment including remdesivir and steroids.  She was not deemed to be candidate for baricitinib/Actemra.  On 2/8C suffered from acute MI/STEMI therefore taken for left heart catheterization 2/9.  Successfully extubated on 2/11.  Assessment & Plan:   Principal Problem:   Acute hypoxemic respiratory failure due to COVID-19 [facility]) Active Problems:   DKA (diabetic ketoacidosis) (HCC)   Obesity, Class III, BMI 40-49.9 (morbid obesity) (HCC)   Acute metabolic encephalopathy   ARDS (adult respiratory distress syndrome) (HCC)   ST elevation myocardial infarction (STEMI) (HCC)   Acute respiratory failure with hypoxia (HCC)   VT (ventricular tachycardia) (HCC)   Elevated troponin     Acute hypoxemic respiratory failure due to severe COVID-19 pneumonia/ARDS 2/1 Rquire Mechanical ventilation  Successfully extubated 01/22/21 -currently on room air at rest Completed course of remdesivir and antibiotics. Due to suspected CAP no baricitinib/actemra Continue steroid taper. Currently on IV Solu-Medrol, will transition to p.o. prednisone Labs downtrending appropriately  AKI with acute renal failure, resolving -Initially patient was on CRRT which was discontinued on 2/9.  Currently hemodynamically stable.  Continue to monitor urine output and renal function. -Foley catheter discontinued. Creatinine improved, 2.35. -No baseline renal function available -Nephrology following  Acute inferior  STEMI with associated diastolic heart failure and Vtach -Cardiology team is following.  Status post left heart cath 2/9-no significant CAD seen.  Suspect myocarditis.    Eventually will need repeat echocardiogram. -Echocardiogram 2/9-EF 60 to 65% -LDL 65. A1c is 11.8  Ambulatory dysfunction, profound  -Secondary to acute illness.  PT/OT.   Profound hyperglycemia secondary to newly diagnosed uncontrolled diabetes - insulin dependent -Hemoglobin A1c-11.8 -Insulin sliding scale and Accu-Chek -Diabetic coordinator following  PT-CIR   DVT prophylaxis: Subcu heparin Code Status: Full code Family Communication: None at bedside  Status is: Inpatient  Remains inpatient appropriate because:Inpatient level of care appropriate due to severity of illness   Dispo: The patient is from: Home              Anticipated d/c is to: SNF              Anticipated d/c date is: 3 days              Patient currently is not medically stable to d/c.   Difficult to place patient No  Body mass index is 48.69 kg/m.       Subjective: Feels a little better today, denies any complaints at this time. He is slowly tolerating his diet. Tells me he was able to drink his milk and coffee without any issues  Review of Systems Otherwise negative except as per HPI, including: General: Denies fever, chills, night sweats or unintended weight loss. Resp: Denies cough, wheezing, shortness of breath. Cardiac: Denies chest pain, palpitations, orthopnea, paroxysmal nocturnal dyspnea. GI: Denies abdominal pain, nausea, vomiting, diarrhea or constipation GU: Denies dysuria, frequency, hesitancy or incontinence MS: Denies muscle aches, joint pain or swelling Neuro: Denies headache, neurologic deficits (focal weakness, numbness, tingling),  abnormal gait Psych: Denies anxiety, depression, SI/HI/AVH Skin: Denies new rashes or lesions ID: Denies sick contacts, exotic exposures, travel  Examination: Constitutional:  Not in acute distress, morbidly obese Respiratory: Clear to auscultation bilaterally Cardiovascular: Normal sinus rhythm, no rubs Abdomen: Nontender nondistended good bowel sounds Musculoskeletal: No edema noted Skin: No rashes seen Neurologic: CN 2-12 grossly intact.  And nonfocal Psychiatric: Normal judgment and insight. Alert and oriented x 3. Normal mood. Foley catheter in place, will be removed   Objective: Vitals:   01/27/21 1946 01/28/21 0535 01/28/21 0830 01/28/21 1218  BP: (!) 140/101 (!) 144/98 (!) 144/95 (!) 146/86  Pulse: (!) 101 (!) 102 (!) 101 (!) 104  Resp:  18 18 20   Temp: 99.6 F (37.6 C) 98.6 F (37 C) 98.4 F (36.9 C) 98.9 F (37.2 C)  TempSrc: Oral Oral Oral Oral  SpO2: 96% 96% 95% 95%  Weight:      Height:        Intake/Output Summary (Last 24 hours) at 01/28/2021 1244 Last data filed at 01/27/2021 1800 Gross per 24 hour  Intake 642.44 ml  Output 950 ml  Net -307.56 ml   Filed Weights   01/25/21 0412 01/26/21 0449 01/27/21 0537  Weight: (!) 186.9 kg (!) 165.1 kg (!) 162.8 kg     Data Reviewed:   CBC: Recent Labs  Lab 01/24/21 0615 01/25/21 0515 01/26/21 0651 01/27/21 1105 01/28/21 1050  WBC 13.2* 12.2* 12.4* 8.8 9.6  NEUTROABS 10.0* 8.9* 8.7* 6.2 8.0*  HGB 9.2* 8.7* 8.8* 9.1* 9.6*  HCT 30.2* 29.0* 30.0* 30.7* 33.5*  MCV 83.7 85.3 88.0 88.5 88.6  PLT 371 340 350 350 337   Basic Metabolic Panel: Recent Labs  Lab 01/22/21 0526 01/23/21 0517 01/24/21 0615 01/25/21 0515 01/26/21 0651 01/28/21 1050  NA 139 142 148* 151* 152* 150*  K 3.9 5.0 4.6 4.0 4.4 4.6  CL 107 109 114* 119* 121* 116*  CO2 21* 21* 20* 23 21* 24  GLUCOSE 128* 216* 159* 90 98 260*  BUN 51* 62* 67* 59* 53* 39*  CREATININE 3.39* 3.44* 3.15* 2.74* 2.53* 2.35*  CALCIUM 8.3* 8.8* 8.8* 8.9 8.8* 9.2  MG 2.2 2.4 2.3 2.3 2.2 2.1  PHOS 5.7* 7.0* 6.1* 4.8* 3.9  --    GFR: Estimated Creatinine Clearance: 73.3 mL/min (A) (by C-G formula based on SCr of 2.35 mg/dL  (H)). Liver Function Tests: Recent Labs  Lab 01/23/21 0517 01/24/21 0615 01/25/21 0515 01/26/21 0651 01/28/21 1050  AST  --   --   --   --  29  ALT  --   --   --   --  25  ALKPHOS  --   --   --   --  70  BILITOT  --   --   --   --  0.4  PROT  --   --   --   --  6.9  ALBUMIN 1.9* 2.3* 2.2* 2.5* 2.6*   No results for input(s): LIPASE, AMYLASE in the last 168 hours. No results for input(s): AMMONIA in the last 168 hours. Coagulation Profile: No results for input(s): INR, PROTIME in the last 168 hours. Cardiac Enzymes: No results for input(s): CKTOTAL, CKMB, CKMBINDEX, TROPONINI in the last 168 hours. BNP (last 3 results) No results for input(s): PROBNP in the last 8760 hours. HbA1C: Recent Labs    01/27/21 1105  HGBA1C 11.8*   CBG: Recent Labs  Lab 01/27/21 2153 01/28/21 0007 01/28/21 0531 01/28/21 0827 01/28/21 1215  GLUCAP 112* 116* 119* 143* 234*   Lipid Profile: Recent Labs    01/27/21 1105  CHOL 165  HDL 54  LDLCALC 65  TRIG 230*  CHOLHDL 3.1   Thyroid Function Tests: No results for input(s): TSH, T4TOTAL, FREET4, T3FREE, THYROIDAB in the last 72 hours. Anemia Panel: Recent Labs    01/26/21 0651 01/27/21 1105 01/28/21 1050  FERRITIN 566* 406* 393*  TIBC 232*  --   --   IRON 36*  --   --    Sepsis Labs: No results for input(s): PROCALCITON, LATICACIDVEN in the last 168 hours.  Recent Results (from the past 240 hour(s))  Culture, respiratory (non-expectorated)     Status: None   Collection Time: 01/19/21 10:41 PM   Specimen: Tracheal Aspirate; Respiratory  Result Value Ref Range Status   Specimen Description   Final    TRACHEAL ASPIRATE Performed at [facility], [address] [place], [place] [address]    Special Requests   Final    NONE Performed at [facility], [address] [place], [place] [address]    Gram Stain   Final    FEW WBC PRESENT,BOTH PMN AND MONONUCLEAR RARE BUDDING YEAST SEEN Performed at [facility], [address], [place], [place] [address]    Culture FEW CANDIDA ALBICANS  Final   Report Status 01/22/2021 FINAL  Final         Radiology Studies: No results found.      Scheduled Meds: . albuterol  1-2 puff Inhalation Q6H  . aspirin  81 mg Oral Daily  . atorvastatin  80 mg Oral Daily  . Chlorhexidine Gluconate Cloth  6 each Topical Daily  . clopidogrel  75 mg Oral Daily  . docusate sodium  100 mg Oral BID  . enoxaparin (LOVENOX) injection  0.5 mg/kg Subcutaneous Q24H  . famotidine  20 mg Oral Daily  . feeding supplement (NEPRO CARB STEADY)  237 mL Oral TID BM  . insulin aspart  0-20 Units Subcutaneous Q4H  . insulin detemir  30 Units Subcutaneous Daily  . mouth rinse  15 mL Mouth Rinse BID  . methylPREDNISolone (SOLU-MEDROL) injection  40 mg Intravenous Daily  . metoprolol tartrate  25 mg Oral BID  . multivitamin with minerals  1 tablet Oral Daily  . polyethylene glycol  17 g Oral Daily  . sodium chloride flush  3 mL Intravenous Q12H   Continuous Infusions: . sodium chloride       LOS: 16 days   Time spent= 35 mins    Maudry Zeidan Joline Maxcy, MD [facility]  If 7PM-7AM, please contact night-coverage  01/28/2021, 12:44 PM

## 2021-01-28 NOTE — Progress Notes (Signed)
Physical Therapy Treatment Patient Details Name: Erik [name] MRN: [id number] DOB: 07-29-1991 Today's Date: 01/28/2021    History of Present Illness presented to ER secondary to progressive weakness, decreased PO intake, nausea/vomiting; admitted for management of acute hypoxic respiratory failure due to COVID-19 viral infection, DKA and metabolic encephalopathy.  Hospital course significant for emergent intubation 2/1-2/12/22; CRRT 2/3-01/20/21 via L temporary IJ dialysis catheter (R femoral removed); code STEMI with emergent cardiac cath (showing no significant coronary disease) 01/20/21 with elevated troponin associated with myocarditis.    PT Comments    Continued improvement in affect, vocal quality and overall interaction with therapist this date.  Able to complete OOB to chair via hoyer lift, tolerating transfers and OOB positioning well.  Maintains neutral alignment while up in chair; patient pleased with ability and tolerance.  Maintained position approx 90 minutes prior to need to return to bed (for hygiene purposes). Vitals stable and WFL throughout; will continue to progress transfers as appropriate.  Will plan to issue formal HEP for use outside of therapy next session.    Follow Up Recommendations  CIR     Equipment Recommendations       Recommendations for Other Services       Precautions / Restrictions Precautions Precautions: Fall Restrictions Weight Bearing Restrictions: No    Mobility  Bed Mobility Overal bed mobility: Needs Assistance Bed Mobility: Rolling Rolling: Mod assist;Max assist;+2 for physical assistance   Supine to sit: Mod assist;Max assist;+2 for physical assistance     General bed mobility comments: actively assisting with rolling/rotation, reaching and grasping bedrails with UEs as able (act assist to position L UE); extensive assist to initiate pelvic rotation, but does assist with LEs as able    Transfers Overall transfer level: Needs  assistance                  Ambulation/Gait             General Gait Details: unsafe/unable   Stairs             Wheelchair Mobility    Modified Rankin (Stroke Patients Only)       Balance                                            Cognition Arousal/Alertness: Awake/alert Behavior During Therapy: WFL for tasks assessed/performed Overall Cognitive Status: Within Functional Limits for tasks assessed                                 General Comments: improved vocal quality, affect and overall level of interaction with therapist      Exercises Other Exercises Other Exercises: Participated with ADL routine at bed-level (see OT note for details); good efforts to actively participate and functionally utilize bilat UEs as able.  Continues with act assist to L shoulder motion, all planes. Other Exercises: Multiple rolling trials to R/L, mod/max assist +2 for UE placement and rotation of pelvis.  Does reach/grasp bedrails with UEs as able, and utilizes LEs to assist with rotation. Tolerates position changes well; good effort throughout Other Exercises: Bed/chair transfer via hoyer lift, dep assist +2 for placement of pad, management/safety with lift.  Tolerated lift and transfer well.  Maintains neutral alignment in recliner without difficulty; bilat UEs propped on pillows for shoulder support.  Tends to maintain excessive bilat LE ER in modified long-sitting position; encouraged for IR/adduct as able (patient able to return demonstration).    General Comments        Pertinent Vitals/Pain Pain Assessment: No/denies pain    Home Living                      Prior Function            PT Goals (current goals can now be found in the care plan section) Acute Rehab PT Goals Patient Stated Goal: Sit in chair PT Goal Formulation: With patient Time For Goal Achievement: 02/07/21 Potential to Achieve Goals: Good Progress  towards PT goals: Progressing toward goals    Frequency    Min 2X/week      PT Plan Current plan remains appropriate    Co-evaluation PT/OT/SLP Co-Evaluation/Treatment: Yes Reason for Co-Treatment: Complexity of the patient's impairments (multi-system involvement) PT goals addressed during session: Mobility/safety with mobility OT goals addressed during session: ADL's and self-care      AM-PAC PT "6 Clicks" Mobility   Outcome Measure  Help needed turning from your back to your side while in a flat bed without using bedrails?: A Lot Help needed moving from lying on your back to sitting on the side of a flat bed without using bedrails?: Total Help needed moving to and from a bed to a chair (including a wheelchair)?: Total Help needed standing up from a chair using your arms (e.g., wheelchair or bedside chair)?: Total Help needed to walk in hospital room?: Total Help needed climbing 3-5 steps with a railing? : Total 6 Click Score: 7    End of Session   Activity Tolerance: Patient tolerated treatment well Patient left: in chair;with call bell/phone within reach;with chair alarm set Nurse Communication: Mobility status PT Visit Diagnosis: Muscle weakness (generalized) (M62.81);Difficulty in walking, not elsewhere classified (R26.2)     Time: 3154-0086 (co-treat with OT); 1350-1400 (return to bed) PT Time Calculation (min) (ACUTE ONLY): 40 min  Charges:  $Therapeutic Activity: 23-37 mins

## 2021-01-28 NOTE — Progress Notes (Signed)
Occupational Therapy Treatment Patient Details Name: Erik [name] MRN: [id number] DOB: 12/12/1990 Today's Date: 01/28/2021    History of present illness presented to ER secondary to progressive weakness, decreased PO intake, nausea/vomiting; admitted for management of acute hypoxic respiratory failure due to COVID-19 viral infection, DKA and metabolic encephalopathy.  Hospital course significant for emergent intubation 2/1-2/12/22; CRRT 2/3-01/20/21 via L temporary IJ dialysis catheter (R femoral removed); code STEMI with emergent cardiac cath (showing no significant coronary disease) 01/20/21 with elevated troponin associated with myocarditis.   OT comments  Pt seen for OT treatment this date (Co-Tx with PT) to improve pt functional independence with self care ADLs/ADL mobility. OT Engages pt in aspects of bed bath including UB with SETUP to MIN A, and MIN A With drying underarms and applying deodorant. OT Educates re: see-saw method for bathing/drying under L UE as pt with decreased tolerance for ROM. Pt requires MAX to TOTAL A for LB bathing/toileting/peri care and requires MAX A +2 for bed level rolling to complete task thoroughly. Extensive time required for all aspects of bed bath and rolling participation, but with MIN verbal/tactile cues for hand placement, pt makes meaningful effort to participate and completes ~4 rolls bilaterally during ADL tasks. Pt tolerates hoyer lift transfer with PT/OT ensuring safety and positioning as well as educating RN re: best technique for use to transfer pt to/from bed. RN is receptive and participatory throughout. Pt left in chair with chair alarm. All needs met and in reach. SLP presenting for treatment. RN aware of session. Will continue to follow acutely. Continue to anticipate that pt will require extensive rehabilitation in CIR setting upon d/c from acute setting.    Follow Up Recommendations  CIR    Equipment Recommendations  Other (comment) (defer)     Recommendations for Other Services      Precautions / Restrictions Precautions Precautions: Fall Restrictions Weight Bearing Restrictions: No Other Position/Activity Restrictions: Temp HD IJ cath       Mobility Bed Mobility Overal bed mobility: Needs Assistance Bed Mobility: Rolling Rolling: Mod assist;Max assist;+2 for physical assistance   Supine to sit: Mod assist;Max assist;+2 for physical assistance     General bed mobility comments: actively assisting with rolling/rotation, reaching and grasping bedrails with UEs as able (act assist to position L UE); extensive assist to initiate pelvic rotation, but does assist with LEs as able  Transfers Overall transfer level: Needs assistance               General transfer comment: TOTAL A transfer with 2 people managing viking lift from bed to chair.    Balance       Sitting balance - Comments: pt seen supported sitting in chair this date. While balance is good d/t support, pt noted to have difficulty managing LEs on recliner/LE elevating portion of chair.                                   ADL either performed or assessed with clinical judgement   ADL Overall ADL's : Needs assistance/impaired     Grooming: Wash/dry face;Set up;Bed level;Applying deodorant Grooming Details (indicate cue type and reason): Pt requires increased assist for R underarm deodorant application versus L as he has less ROM with L UE. Upper Body Bathing: Minimal assistance;Set up;Bed level   Lower Body Bathing: Maximal assistance;Total assistance;Bed level Lower Body Bathing Details (indicate cue type and reason): using lateral rolling   technique, one person assist for bathing task, but 2p assist to roll for task Upper Body Dressing : Minimal assistance;Bed level Upper Body Dressing Details (indicate cue type and reason): garment oriented for patient Lower Body Dressing: Maximal assistance;Total assistance;Bed level        Toileting- Clothing Manipulation and Hygiene: Maximal assistance;+2 for physical assistance;Bed level Toileting - Clothing Manipulation Details (indicate cue type and reason): pt with large liquid BM in bed despite FMS connected.             Vision Patient Visual Report: No change from baseline     Perception     Praxis      Cognition Arousal/Alertness: Awake/alert Behavior During Therapy: WFL for tasks assessed/performed Overall Cognitive Status: Within Functional Limits for tasks assessed                                 General Comments: limited verbal communication throughout session, appropriate with command following.        Exercises Other Exercises Other Exercises: OT engages pt in bed level UB/LB ADLs with RN assisting throughout and PT/OT Co-tx to manage sling for viking lift (MAX A +2 for rolling and 2p managing sling for safety with hoyer lift transfer). PT/OT Educate RN re: positioning with viking lift to ensure good placement with transfer to chair for pt comfort. Other Exercises: Multiple rolling trials to R/L, mod/max assist +2 for UE placement and rotation of pelvis.  Does reach/grasp bedrails with UEs as able, and utilizes LEs to assist with rotation. Tolerates position changes well; good effort throughout Other Exercises: Bed/chair transfer via hoyer lift, dep assist +2 for placement of pad, management/safety with lift.  Tolerated lift and transfer well.  Maintains neutral alignment in recliner without difficulty; bilat UEs propped on pillows for shoulder support.  Tends to maintain excessive bilat LE ER in modified long-sitting position; encouraged for IR/adduct as able (patient able to return demonstration).   Shoulder Instructions       General Comments      Pertinent Vitals/ Pain       Pain Assessment: No/denies pain  Home Living                                          Prior Functioning/Environment               Frequency  Min 2X/week        Progress Toward Goals  OT Goals(current goals can now be found in the care plan section)  Progress towards OT goals: Progressing toward goals  Acute Rehab OT Goals Patient Stated Goal: Sit in chair OT Goal Formulation: With patient Time For Goal Achievement: 02/08/21 Potential to Achieve Goals: Good  Plan Discharge plan remains appropriate;Frequency remains appropriate    Co-evaluation    PT/OT/SLP Co-Evaluation/Treatment: Yes Reason for Co-Treatment: Complexity of the patient's impairments (multi-system involvement);For patient/therapist safety;To address functional/ADL transfers PT goals addressed during session: Mobility/safety with mobility OT goals addressed during session: ADL's and self-care      AM-PAC OT "6 Clicks" Daily Activity     Outcome Measure   Help from another person eating meals?: A Little Help from another person taking care of personal grooming?: A Lot Help from another person toileting, which includes using toliet, bedpan, or urinal?: Total Help from another person bathing (including   washing, rinsing, drying)?: A Lot Help from another person to put on and taking off regular upper body clothing?: A Little Help from another person to put on and taking off regular lower body clothing?: A Lot 6 Click Score: 13    End of Session Equipment Utilized During Treatment: Other (comment) (viking lift)  OT Visit Diagnosis: Unsteadiness on feet (R26.81);Muscle weakness (generalized) (M62.81)   Activity Tolerance Patient tolerated treatment well   Patient Left in chair;with call bell/phone within reach;with chair alarm set   Nurse Communication Mobility status        Time: 1145-1226 OT Time Calculation (min): 41 min  Charges: OT General Charges $OT Visit: 1 Visit OT Treatments $Self Care/Home Management : 23-37 mins  Alison Sell, MS, OTR/L ascom [phone] 01/28/21, 3:37 PM   

## 2021-01-28 NOTE — Progress Notes (Signed)
Pt mother notified that the patient will be moving to room 125.

## 2021-01-28 NOTE — TOC Progression Note (Signed)
Transition of Care [facility]) - Progression Note    Patient Details  Name: Erik [name] MRN: [id number] Date of Birth: December 02, 1991  [facility]) CM/SW Contact  Hetty Ely, RN Phone Number: 01/28/2021, 11:51 AM  Clinical Narrative:   Several phone attempts made to mother to attempt TOC Assessment no answer no voice message available on phone. Room phone attempt done, patient fails to answer, nurses says patient is NMS and not responding to verbal commands at this time.         Expected Discharge Plan and Services                                                 Social Determinants of Health (SDOH) Interventions    Readmission Risk Interventions No flowsheet data found.

## 2021-01-28 NOTE — Progress Notes (Signed)
Speech Language Pathology Treatment: Dysphagia  Patient Details Name: Erik [name] MRN: [id number] DOB: [birth year] Today's Date: 01/28/2021 Time: 8127-5170 SLP Time Calculation (min) (ACUTE ONLY): 40 min  Assessment / Plan / Recommendation Clinical Impression  Pt seen for ongoing assessment of swallowing. Diet was upgraded from a dysphagia diet to mech soft w/ thin liquids via Cup w/ aspiration precautions. No deficits since yesterday reported via chart. Pt himself stated he did "ok" eating a little "soup" last night but endorsed he did not want to "overdo it" too quickly. Discussed the need for him to order solid foods and to begin eating again. He clearly stated he did not want to back to drinking the Nectar liquids again.  He appears much vocally today; verbally responsive and follows instructions easily. Pt is on RA; wbc wnl. Pt is improving w/ PT/OT per notes; out of bed in a chair via hoyer lift during their (just completed) session today. Pulmonary status improving per MD notes. Pt's requested grape juice but declined the Nepro on tray table. No family present this session but pt was texting on the phone toward end of session. Pt explained general aspiration precautions and agreed verbally to the need for following them especially sitting upright for all oral intake and taking Small, Single sips Slowly. He endorsed he felt the liquid "go down the wrong way" when he too a larger sip. Pt consumed ice chips, then thin liquids VIA CUP -- immediate cough noted x1 w/ first trial. No further overt clinical s/s of aspiration noted w/ ~3+ ozs of juice consumed during session. Instructed pt on maintaining focus w/ head forward when swallowing w/ Single, Small sips. Educated on No talking and reducing distractions during po's w/ pt/Mother also. Pt HELD CUP when DRINKING for safer swallowing; NO Straws utilized for safer swallowing. Pt stated the first sip may have been "too big". Discussed w/ pt the soft  solid foods to choose from on the menu - moistened w/ gravies, condiments. Discussed aspiration precautions along w/ the food consistencies and instructed pt NOT to mix food and drink/liquid together(mixed consistency) -- ONE BITE OF FOOD OR SIP OF LIQUID IN THE MOUTH AT ONE TIME. Pt stated he was being "careful" and chewing the foods well. Education on aspiration precautions; food and drink consistencies; food prep for ease; meds Crushed in Puree completed(pt requested Crushed Meds d/t difficulty swallowing whole pills "all my life". Discussed the above w/ NSG who agreed w/ Crushed meds in puree. Recommend continue a Dysphagia level 3 diet (mech soft) w/ gravies added to moisten foods; Thin liquids VIA CUP. Recommend general aspiration precautions; Pills Crushed in Puree; tray setup and positioning assistance for meals - pt MUST HOLD CUP to DRINK, No Straws. ST services will continue to f/u w/ pt for toleration of diet and education as needed while admitted. NSG updated. Precautions posted at bedside.     HPI HPI: Per admitting H&P " Erik [name] is a 30 y.o. male with medical history significant for not on previous prescription medications, has asthma however has not been a provider since he was a teenager.     He presents via the mother for chief concerns of weakness, poor p.o. intake, lethargy that has been ongoing for the last 2 weeks, 12/27/2020.  She denies fever at home.  She reports that he endorses nausea and vomiting.  He has had poor p.o. intake but has been drinking apple juice and orange juice" After presenting to the ER with progressive  weakness, decreased PO intake, nausea/vomiting, he wasadmitted for management of acute hypoxic respiratory failure due to COVID-19 viral infection, DKA and metabolic encephalopathy.  Pt was emergently intubated 2/1, extubated 01/23/21.  Pt on RA.      SLP Plan  Continue with current plan of care       Recommendations  Diet recommendations: Dysphagia 3  (mechanical soft);Thin liquid Liquids provided via: Cup;No straw Medication Administration: Crushed with puree Supervision: Patient able to self feed;Intermittent supervision to cue for compensatory strategies Compensations: Minimize environmental distractions;Slow rate;Small sips/bites;Lingual sweep for clearance of pocketing;Follow solids with liquid Postural Changes and/or Swallow Maneuvers: Out of bed for meals;Seated upright 90 degrees;Upright 30-60 min after meal                General recommendations:  (CIR) Oral Care Recommendations: Oral care BID;Patient independent with oral care;Staff/trained caregiver to provide oral care Follow up Recommendations: Inpatient Rehab;Skilled Nursing facility (TBD) SLP Visit Diagnosis: Dysphagia, oropharyngeal phase (R13.12) Plan: Continue with current plan of care       GO                 Jerilynn Som, MS, CCC-SLP Speech Language Pathologist Rehab Services [phone] [facility] 01/28/2021, 2:00 PM

## 2021-01-29 LAB — CBC WITH DIFFERENTIAL/PLATELET
Abs Immature Granulocytes: 0.03 10*3/uL (ref 0.00–0.07)
Basophils Absolute: 0 10*3/uL (ref 0.0–0.1)
Basophils Relative: 0 %
Eosinophils Absolute: 0.1 10*3/uL (ref 0.0–0.5)
Eosinophils Relative: 1 %
HCT: 30.9 % — ABNORMAL LOW (ref 39.0–52.0)
Hemoglobin: 8.9 g/dL — ABNORMAL LOW (ref 13.0–17.0)
Immature Granulocytes: 0 %
Lymphocytes Relative: 25 %
Lymphs Abs: 2.3 10*3/uL (ref 0.7–4.0)
MCH: 25.7 pg — ABNORMAL LOW (ref 26.0–34.0)
MCHC: 28.8 g/dL — ABNORMAL LOW (ref 30.0–36.0)
MCV: 89.3 fL (ref 80.0–100.0)
Monocytes Absolute: 0.8 10*3/uL (ref 0.1–1.0)
Monocytes Relative: 9 %
Neutro Abs: 5.8 10*3/uL (ref 1.7–7.7)
Neutrophils Relative %: 65 %
Platelets: 310 10*3/uL (ref 150–400)
RBC: 3.46 MIL/uL — ABNORMAL LOW (ref 4.22–5.81)
RDW: 15.8 % — ABNORMAL HIGH (ref 11.5–15.5)
WBC: 9 10*3/uL (ref 4.0–10.5)
nRBC: 0 % (ref 0.0–0.2)

## 2021-01-29 LAB — COMPREHENSIVE METABOLIC PANEL
ALT: 19 U/L (ref 0–44)
AST: 14 U/L — ABNORMAL LOW (ref 15–41)
Albumin: 2.4 g/dL — ABNORMAL LOW (ref 3.5–5.0)
Alkaline Phosphatase: 64 U/L (ref 38–126)
Anion gap: 8 (ref 5–15)
BUN: 33 mg/dL — ABNORMAL HIGH (ref 6–20)
CO2: 24 mmol/L (ref 22–32)
Calcium: 9.1 mg/dL (ref 8.9–10.3)
Chloride: 119 mmol/L — ABNORMAL HIGH (ref 98–111)
Creatinine, Ser: 2.15 mg/dL — ABNORMAL HIGH (ref 0.61–1.24)
GFR, Estimated: 42 mL/min — ABNORMAL LOW (ref >60)
Glucose, Bld: 135 mg/dL — ABNORMAL HIGH (ref 70–99)
Potassium: 4.1 mmol/L (ref 3.5–5.1)
Sodium: 151 mmol/L — ABNORMAL HIGH (ref 135–145)
Total Bilirubin: 0.5 mg/dL (ref 0.3–1.2)
Total Protein: 6.1 g/dL — ABNORMAL LOW (ref 6.5–8.1)

## 2021-01-29 LAB — GLUCOSE, CAPILLARY
Glucose-Capillary: 125 mg/dL — ABNORMAL HIGH (ref 70–99)
Glucose-Capillary: 130 mg/dL — ABNORMAL HIGH (ref 70–99)
Glucose-Capillary: 138 mg/dL — ABNORMAL HIGH (ref 70–99)
Glucose-Capillary: 164 mg/dL — ABNORMAL HIGH (ref 70–99)
Glucose-Capillary: 204 mg/dL — ABNORMAL HIGH (ref 70–99)
Glucose-Capillary: 209 mg/dL — ABNORMAL HIGH (ref 70–99)

## 2021-01-29 LAB — C-REACTIVE PROTEIN: CRP: 0.6 mg/dL (ref <1.0)

## 2021-01-29 LAB — D-DIMER, QUANTITATIVE: D-Dimer, Quant: 2.11 ug/mL-FEU — ABNORMAL HIGH (ref 0.00–0.50)

## 2021-01-29 LAB — MAGNESIUM: Magnesium: 1.9 mg/dL (ref 1.7–2.4)

## 2021-01-29 LAB — FERRITIN: Ferritin: 313 ng/mL (ref 24–336)

## 2021-01-29 MED ORDER — INSULIN ASPART 100 UNIT/ML ~~LOC~~ SOLN
4.0000 [IU] | Freq: Three times a day (TID) | SUBCUTANEOUS | Status: DC
  Administered 2021-01-29 – 2021-02-01 (×9): 4 [IU] via SUBCUTANEOUS
  Filled 2021-01-29 (×8): qty 1

## 2021-01-29 MED ORDER — INSULIN ASPART 100 UNIT/ML ~~LOC~~ SOLN
0.0000 [IU] | Freq: Three times a day (TID) | SUBCUTANEOUS | Status: DC
  Administered 2021-01-29: 7 [IU] via SUBCUTANEOUS
  Administered 2021-01-29: 22:00:00 4 [IU] via SUBCUTANEOUS
  Administered 2021-01-29: 13:00:00 7 [IU] via SUBCUTANEOUS
  Administered 2021-01-30: 4 [IU] via SUBCUTANEOUS
  Administered 2021-01-30: 17:00:00 11 [IU] via SUBCUTANEOUS
  Administered 2021-01-30: 21:00:00 7 [IU] via SUBCUTANEOUS
  Administered 2021-01-30: 13:00:00 4 [IU] via SUBCUTANEOUS
  Administered 2021-01-31: 22:00:00 11 [IU] via SUBCUTANEOUS
  Administered 2021-01-31: 09:00:00 3 [IU] via SUBCUTANEOUS
  Administered 2021-01-31: 18:00:00 11 [IU] via SUBCUTANEOUS
  Administered 2021-01-31 – 2021-02-01 (×2): 4 [IU] via SUBCUTANEOUS
  Administered 2021-02-01: 7 [IU] via SUBCUTANEOUS
  Administered 2021-02-01: 11 [IU] via SUBCUTANEOUS
  Administered 2021-02-02 (×2): 4 [IU] via SUBCUTANEOUS
  Filled 2021-01-29 (×15): qty 1

## 2021-01-29 NOTE — TOC Initial Note (Signed)
Transition of Care [facility]) - Initial/Assessment Note    Patient Details  Name: Erik [name] MRN: [id number] Date of Birth: 1991-12-04  [facility] CM/SW Contact:    Liliana Cline, LCSW Phone Number: 01/29/2021, 2:15 PM  Clinical Narrative:                Patient requested that CSW speak with his Mother regarding discharge planning. CSW spoke with patient's Mother via phone. She reported patient lives with her and she provides transportation. Patient is uninsured and does not have a PCP. She was interested in a referral to [facility] and Medication Management for patient to use when he returns home, referral made on Epic. No DME, HH or SNF history. CSW explained CIR recommendation. She is open to this recommendation. CSW asked CIR Representative Luther Parody to reach out to patient's Mother as well to answer additional questions/provide information regarding CIR.   Expected Discharge Plan: IP Rehab Facility Barriers to Discharge: Continued Medical Work up   Patient Goals and CMS Choice Patient states their goals for this hospitalization and ongoing recovery are:: CIR CMS Medicare.gov Compare Post Acute Care list provided to:: Patient Represenative (must comment) Choice offered to / list presented to : Parent  Expected Discharge Plan and Services Expected Discharge Plan: IP Rehab Facility       Living arrangements for the past 2 months: Single Family Home                                      Prior Living Arrangements/Services Living arrangements for the past 2 months: Single Family Home Lives with:: Parents Patient language and need for interpreter reviewed:: Yes Do you feel safe going back to the place where you live?: Yes      Need for Family Participation in Patient Care: Yes (Comment) Care giver support system in place?: Yes (comment)   Criminal Activity/Legal Involvement Pertinent to Current Situation/Hospitalization: No - Comment as  needed  Activities of Daily Living Home Assistive Devices/Equipment: None ADL Screening (condition at time of admission) Patient's cognitive ability adequate to safely complete daily activities?: Yes Is the patient deaf or have difficulty hearing?: No Does the patient have difficulty seeing, even when wearing glasses/contacts?: No Does the patient have difficulty concentrating, remembering, or making decisions?: No Patient able to express need for assistance with ADLs?: Yes Does the patient have difficulty dressing or bathing?: No Independently performs ADLs?: Yes (appropriate for developmental age) Does the patient have difficulty walking or climbing stairs?: Yes Weakness of Legs: Both Weakness of Arms/Hands: None  Permission Sought/Granted Permission sought to share information with : Facility Industrial/product designer granted to share information with : Yes, Verbal Permission Granted (by Mother)  Share Information with NAME: Patient requested CSW speak with his Mother about DC planning.  Permission granted to share info w AGENCY: CIR        Emotional Assessment       Orientation: : Oriented to Self,Oriented to Place,Oriented to  Time,Oriented to Situation Alcohol / Substance Use: Not Applicable Psych Involvement: No (comment)  Admission diagnosis:  ARDS (adult respiratory distress syndrome) (HCC) [J80] Acute respiratory failure with hypoxia (HCC) [J96.01] Acute hypoxemic respiratory failure due to COVID-19 (HCC) [U07.1, J96.01] Hyperosmolar hyperglycemic state (HHS) (HCC) [E11.00, E11.65] Pneumonia due to COVID-19 virus [U07.1, J12.82] Patient Active Problem List   Diagnosis Date Noted  . VT (ventricular tachycardia) (HCC)   .  Elevated troponin   . Acute respiratory failure with hypoxia (HCC)   . ST elevation myocardial infarction (STEMI) (HCC)   . Acute hypoxemic respiratory failure due to COVID-19 (HCC) 01/12/2021  . DKA (diabetic ketoacidosis) (HCC)  01/12/2021  . Obesity, Class III, BMI 40-49.9 (morbid obesity) (HCC) 01/12/2021  . Acute metabolic encephalopathy 01/12/2021  . ARDS (adult respiratory distress syndrome) (HCC) 01/12/2021   PCP:  Pcp, No Pharmacy:  No Pharmacies Listed    Social Determinants of Health (SDOH) Interventions    Readmission Risk Interventions No flowsheet data found.

## 2021-01-29 NOTE — Progress Notes (Addendum)
PROGRESS NOTE    Erik [name]  AYT:[id number] DOB: [birth year] DOA: 01/12/2021 PCP: Pcp, No   Brief Narrative:   30 yo morbidly obese male with Asthma now with progressive multiorgan failure with COVID 19 pneumonia and ARDS with severe acidosis and severe hypoxia with ARDS with acute renal failure; now extubated and off renal replacement therapy.  Initially due to severe respiratory failure she was intubated was found to be in acute kidney injury started on CRRT.  She was found to be COVID-19 positive requiring treatment including remdesivir and steroids.  She was not deemed to be candidate for baricitinib/Actemra.  On 2/8C suffered from acute MI/STEMI therefore taken for left heart catheterization 2/9.  Successfully extubated on 2/11.  Assessment & Plan:   Principal Problem:   Acute hypoxemic respiratory failure due to COVID-19 [facility]) Active Problems:   DKA (diabetic ketoacidosis) (HCC)   Obesity, Class III, BMI 40-49.9 (morbid obesity) (HCC)   Acute metabolic encephalopathy   ARDS (adult respiratory distress syndrome) (HCC)   ST elevation myocardial infarction (STEMI) (HCC)   Acute respiratory failure with hypoxia (HCC)   VT (ventricular tachycardia) (HCC)   Elevated troponin   Acute hypoxemic respiratory failure due to severe COVID-19 pneumonia/ARDS 2/1 Rquire Mechanical ventilation  Successfully extubated 01/22/21 -currently on room air at rest Completed course of remdesivir and antibiotics. Due to suspected CAP no baricitinib/actemra Steroids changed to oral prednisone Labs downtrending appropriately Last day of isolation 02/01/2021  AKI with acute renal failure, resolving -Initially patient was on CRRT which was discontinued on 2/9.  Currently hemodynamically stable.  Continue to monitor urine output and renal function. -Foley catheter discontinued. Creatinine improved, 2.15 -No baseline renal function available -Nephrology following  Acute inferior STEMI with associated  diastolic heart failure and Vtach -Cardiology team is following.  Status post left heart cath 2/9-no significant CAD seen.  Suspect myocarditis.  -Echocardiogram 2/9-EF 60 to 65% -LDL 65. A1c is 11.8  Ambulatory dysfunction, profound  -Secondary to acute illness.  PT/OT-will need CIR.   Profound hyperglycemia secondary to newly diagnosed uncontrolled diabetes - insulin dependent -Hemoglobin A1c-11.8 -Insulin sliding scale and Accu-Chek -Diabetic coordinator following  PT-CIR   DVT prophylaxis: Subcu heparin Code Status: Full code Family Communication: None at bedside  Status is: Inpatient  Remains inpatient appropriate because:Inpatient level of care appropriate due to severity of illness   Dispo: The patient is from: Home              Anticipated d/c is to: CIR              Anticipated d/c date is: 1-2 days              Patient currently is not medically stable to d/c.  Awaiting clearance by nephrology   Difficult to place patient No  Body mass index is 48.69 kg/m.       Subjective: Tolerating PO today.  Review of Systems Otherwise negative except as per HPI, including: General: Denies fever, chills, night sweats or unintended weight loss. Resp: Denies cough, wheezing, shortness of breath. Cardiac: Denies chest pain, palpitations, orthopnea, paroxysmal nocturnal dyspnea. GI: Denies abdominal pain, nausea, vomiting, diarrhea or constipation GU: Denies dysuria, frequency, hesitancy or incontinence MS: Denies muscle aches, joint pain or swelling Neuro: Denies headache, neurologic deficits (focal weakness, numbness, tingling), abnormal gait Psych: Denies anxiety, depression, SI/HI/AVH Skin: Denies new rashes or lesions ID: Denies sick contacts, exotic exposures, travel  Examination: Constitutional: Not in acute distress, morbidly obese Respiratory: Clear to  auscultation bilaterally Cardiovascular: Normal sinus rhythm, no rubs Abdomen: Nontender nondistended  good bowel sounds Musculoskeletal: No edema noted Skin: No rashes seen Neurologic: CN 2-12 grossly intact.  And nonfocal Psychiatric: Normal judgment and insight. Alert and oriented x 3. Normal mood.   Objective: Vitals:   01/28/21 1940 01/29/21 0027 01/29/21 0442 01/29/21 0802  BP: (!) 157/98 (!) 146/87 125/80 127/70  Pulse: (!) 109 (!) 102 (!) 104 98  Resp: 20 17 18 16   Temp: 97.9 F (36.6 C) 98.6 F (37 C) 99.3 F (37.4 C) 99.1 F (37.3 C)  TempSrc: Oral Oral Oral Oral  SpO2: 96% 95% 94% 95%  Weight:      Height:       No intake or output data in the 24 hours ending 01/29/21 0827 Filed Weights   01/25/21 0412 01/26/21 0449 01/27/21 0537  Weight: (!) 186.9 kg (!) 165.1 kg (!) 162.8 kg     Data Reviewed:   CBC: Recent Labs  Lab 01/25/21 0515 01/26/21 0651 01/27/21 1105 01/28/21 1050 01/29/21 0515  WBC 12.2* 12.4* 8.8 9.6 9.0  NEUTROABS 8.9* 8.7* 6.2 8.0* 5.8  HGB 8.7* 8.8* 9.1* 9.6* 8.9*  HCT 29.0* 30.0* 30.7* 33.5* 30.9*  MCV 85.3 88.0 88.5 88.6 89.3  PLT 340 350 350 337 310   Basic Metabolic Panel: Recent Labs  Lab 01/23/21 0517 01/24/21 0615 01/25/21 0515 01/26/21 0651 01/28/21 1050 01/29/21 0515  NA 142 148* 151* 152* 150* 151*  K 5.0 4.6 4.0 4.4 4.6 4.1  CL 109 114* 119* 121* 116* 119*  CO2 21* 20* 23 21* 24 24  GLUCOSE 216* 159* 90 98 260* 135*  BUN 62* 67* 59* 53* 39* 33*  CREATININE 3.44* 3.15* 2.74* 2.53* 2.35* 2.15*  CALCIUM 8.8* 8.8* 8.9 8.8* 9.2 9.1  MG 2.4 2.3 2.3 2.2 2.1 1.9  PHOS 7.0* 6.1* 4.8* 3.9  --   --    GFR: Estimated Creatinine Clearance: 80.1 mL/min (A) (by C-G formula based on SCr of 2.15 mg/dL (H)). Liver Function Tests: Recent Labs  Lab 01/24/21 0615 01/25/21 0515 01/26/21 0651 01/28/21 1050 01/29/21 0515  AST  --   --   --  29 14*  ALT  --   --   --  25 19  ALKPHOS  --   --   --  70 64  BILITOT  --   --   --  0.4 0.5  PROT  --   --   --  6.9 6.1*  ALBUMIN 2.3* 2.2* 2.5* 2.6* 2.4*   No results for  input(s): LIPASE, AMYLASE in the last 168 hours. No results for input(s): AMMONIA in the last 168 hours. Coagulation Profile: No results for input(s): INR, PROTIME in the last 168 hours. Cardiac Enzymes: No results for input(s): CKTOTAL, CKMB, CKMBINDEX, TROPONINI in the last 168 hours. BNP (last 3 results) No results for input(s): PROBNP in the last 8760 hours. HbA1C: Recent Labs    01/27/21 1105  HGBA1C 11.8*   CBG: Recent Labs  Lab 01/28/21 1735 01/28/21 1938 01/29/21 0027 01/29/21 0441 01/29/21 0800  GLUCAP 236* 217* 125* 130* 138*   Lipid Profile: Recent Labs    01/27/21 1105  CHOL 165  HDL 54  LDLCALC 65  TRIG 230*  CHOLHDL 3.1   Thyroid Function Tests: No results for input(s): TSH, T4TOTAL, FREET4, T3FREE, THYROIDAB in the last 72 hours. Anemia Panel: Recent Labs    01/28/21 1050 01/29/21 0515  FERRITIN 393* 313   Sepsis Labs:  No results for input(s): PROCALCITON, LATICACIDVEN in the last 168 hours.  Recent Results (from the past 240 hour(s))  Culture, respiratory (non-expectorated)     Status: None   Collection Time: 01/19/21 10:41 PM   Specimen: Tracheal Aspirate; Respiratory  Result Value Ref Range Status   Specimen Description   Final    TRACHEAL ASPIRATE Performed at [facility], [address] [place], [place] [address]    Special Requests   Final    NONE Performed at [facility], [address] [place], [place] [address]    Gram Stain   Final    FEW WBC PRESENT,BOTH PMN AND MONONUCLEAR RARE BUDDING YEAST SEEN Performed at [facility], [address] [place], [place] [address]    Culture FEW CANDIDA ALBICANS  Final   Report Status 01/22/2021 FINAL  Final         Radiology Studies: No results found.      Scheduled Meds: . albuterol  1-2 puff Inhalation Q6H  . aspirin  81 mg Oral Daily  . atorvastatin  80 mg Oral Daily  . Chlorhexidine Gluconate Cloth  6 each Topical Daily  . clopidogrel  75  mg Oral Daily  . docusate sodium  100 mg Oral BID  . enoxaparin (LOVENOX) injection  0.5 mg/kg Subcutaneous Q24H  . famotidine  20 mg Oral Daily  . feeding supplement (NEPRO CARB STEADY)  237 mL Oral TID BM  . insulin aspart  0-20 Units Subcutaneous TID AC & HS  . insulin aspart  4 Units Subcutaneous TID WC  . insulin detemir  30 Units Subcutaneous Daily  . mouth rinse  15 mL Mouth Rinse BID  . metoprolol tartrate  25 mg Oral BID  . multivitamin with minerals  1 tablet Oral Daily  . polyethylene glycol  17 g Oral Daily  . predniSONE  40 mg Oral Q breakfast  . sodium chloride flush  3 mL Intravenous Q12H   Continuous Infusions: . sodium chloride       LOS: 17 days   Time spent= 35 mins    Izrael Peak Joline Maxcy, MD [facility]  If 7PM-7AM, please contact night-coverage  01/29/2021, 8:27 AM

## 2021-01-29 NOTE — Progress Notes (Addendum)
Inpatient Diabetes Program Recommendations  AACE/ADA: New Consensus Statement on Inpatient Glycemic Control (2015)  Target Ranges:  Prepandial:   less than 140 mg/dL      Peak postprandial:   less than 180 mg/dL (1-2 hours)      Critically ill patients:  140 - 180 mg/dL   Results for PSALM, SCHAPPELL (MRN [id number]) as of 01/29/2021 07:09  Ref. Range 01/28/2021 00:07 01/28/2021 05:31 01/28/2021 08:27 01/28/2021 12:15 01/28/2021 17:35 01/28/2021 19:38  Glucose-Capillary Latest Ref Range: 70 - 99 mg/dL 700 (H) 174 (H) 944 (H)  3 units NOVOLOG  30 units LEVEMIR  234 (H)  7 units NOVOLOG  236 (H)  7 units NOVOLOG  217 (H)  7 units NOVOLOG     Admit with:DKA/ New Diagnosis DM/ COVID+/ ARDS/ Acute Renal Failure   Current Orders: Levemir 30 units Daily                            Novolog Resistant Correction Scale/ SSI (0-20 units) Q4 hours    Solumedrol stopped--last dose given yest at 8am--To start Prednisone 40 mg Daily today    MD- Please consider the following:  1. Change the Novolog SSI frequency to TID AC + HS (pt getting PO diet now)  2. Start Novolog Meal Coverage: Novolog 4 units TID with meals Hold if pt eats <50% of meal, Hold if pt NPO   Addendum 11am--Got in touch with pt's Mom this AM by phone.  Reviewed admission DKA, treatments, New Diagnosis of Diabetes, etc.  Discussed with Mom pt's current CBGs and current Insulin regimen.  Also reviewed A1c and likely need for insulin when he goes home.  Mom wanted to know if COVID caused the diabetes and whether or not pt has Type 1 or Type 2 diabetes.  Discussed with Mom that pt had many risk factors for development of diabetes (family history and obesity) and that the COVID infection may have placed enough stress on the body to bring the diabetes to the surface.  We discussed how pt may have been having issued with elevated CBGs prior to admission (pre-diabetes) and that he may not have had any symptoms.  We also discussed how  there have been new new diagnosis of diabetes among patients with COVID over the past 2 years.  I shared with Mom that given pt's obesity and that his C-peptide showed he is still making insulin that pt is likely Type 2 diabetes insulin requiring.  However, I did remind Mom that I am not allowed to diagnose however, this is my clinical opinion based on multiple factors.  We also discussed how pt will likely need insulin for at least the next 6 months to 1 year but how pt's medication needs may change as he changes his nutrition and loses weight.  Stressed to [employer] the importance of getting pt regular medical follow up care so that a PCP can follow his diabetes and make medication adjustments.  I also discussed with Mom the 2 different ways to give insulin (vial and syringe versus insulin pens).  I have emailed Mom a Youtube video on using insulin pens at home.  I also reached out to [facility] team and spoke with Meaghan, SW.  I let SW/ [facility] team know that Mom has questions about pt going to Rehab and would like to speak to someone about this.   --Will follow patient during hospitalization--  Ambrose Finland RN, MSN, CDE  Diabetes Coordinator Inpatient Glycemic Control Team Team Pager: [phone] (8a-5p)

## 2021-01-29 NOTE — Progress Notes (Signed)
[facility]  ROUNDING NOTE   Subjective:   Patient is sitting up in bed Alert and oriented Denies shortness of breath and chest pain Able to eat small amounts Denies nausea States he feels good and has been OOB with assist  Objective:  Vital signs in last 24 hours:  Temp:  [97.8 F (36.6 C)-99.3 F (37.4 C)] 99.1 F (37.3 C) (02/18 0802) Pulse Rate:  [96-109] 98 (02/18 0802) Resp:  [16-20] 16 (02/18 0802) BP: (125-171)/(70-100) 127/70 (02/18 0802) SpO2:  [94 %-96 %] 95 % (02/18 0802)  Weight change:  Filed Weights   01/25/21 0412 01/26/21 0449 01/27/21 0537  Weight: (!) 186.9 kg (!) 165.1 kg (!) 162.8 kg    Intake/Output: No intake/output data recorded.   Intake/Output this shift:  No intake/output data recorded.  Physical Exam: General: NAD  Head: Normocephalic, oral mucosa dry  Eyes: Anicteric  Neck:  trachea midline  Lungs:  Rhonchi-left base  Heart: tachycardia  Abdomen:  Soft, obese  Extremities:  no peripheral edema.  Neurologic: Alert, oriented, able to answer questions  Skin: No lesions  Access: none    Basic Metabolic Panel: Recent Labs  Lab 01/23/21 0517 01/24/21 0615 01/25/21 0515 01/26/21 0651 01/28/21 1050 01/29/21 0515  NA 142 148* 151* 152* 150* 151*  K 5.0 4.6 4.0 4.4 4.6 4.1  CL 109 114* 119* 121* 116* 119*  CO2 21* 20* 23 21* 24 24  GLUCOSE 216* 159* 90 98 260* 135*  BUN 62* 67* 59* 53* 39* 33*  CREATININE 3.44* 3.15* 2.74* 2.53* 2.35* 2.15*  CALCIUM 8.8* 8.8* 8.9 8.8* 9.2 9.1  MG 2.4 2.3 2.3 2.2 2.1 1.9  PHOS 7.0* 6.1* 4.8* 3.9  --   --     Liver Function Tests: Recent Labs  Lab 01/24/21 0615 01/25/21 0515 01/26/21 0651 01/28/21 1050 01/29/21 0515  AST  --   --   --  29 14*  ALT  --   --   --  25 19  ALKPHOS  --   --   --  70 64  BILITOT  --   --   --  0.4 0.5  PROT  --   --   --  6.9 6.1*  ALBUMIN 2.3* 2.2* 2.5* 2.6* 2.4*   No results for input(s): LIPASE, AMYLASE in the last 168 hours. No results  for input(s): AMMONIA in the last 168 hours.  CBC: Recent Labs  Lab 01/25/21 0515 01/26/21 0651 01/27/21 1105 01/28/21 1050 01/29/21 0515  WBC 12.2* 12.4* 8.8 9.6 9.0  NEUTROABS 8.9* 8.7* 6.2 8.0* 5.8  HGB 8.7* 8.8* 9.1* 9.6* 8.9*  HCT 29.0* 30.0* 30.7* 33.5* 30.9*  MCV 85.3 88.0 88.5 88.6 89.3  PLT 340 350 350 337 310    Cardiac Enzymes: No results for input(s): CKTOTAL, CKMB, CKMBINDEX, TROPONINI in the last 168 hours.  BNP: Invalid input(s): POCBNP  CBG: Recent Labs  Lab 01/28/21 1735 01/28/21 1938 01/29/21 0027 01/29/21 0441 01/29/21 0800  GLUCAP 236* 217* 125* 130* 138*    Microbiology: Results for orders placed or performed during the hospital encounter of 01/12/21  Blood culture (routine single)     Status: None   Collection Time: 01/12/21  9:05 AM   Specimen: BLOOD  Result Value Ref Range Status   Specimen Description BLOOD LEFT AC  Final   Special Requests   Final    BOTTLES DRAWN AEROBIC ONLY Blood Culture results may not be optimal due to an inadequate volume of blood  received in culture bottles   Culture   Final    NO GROWTH 5 DAYS Performed at [facility], [address] [place], [place] [address]    Report Status 01/17/2021 FINAL  Final  SARS Coronavirus 2 by RT PCR (hospital order, performed in [facility] hospital lab) Nasopharyngeal Nasopharyngeal Swab     Status: Abnormal   Collection Time: 01/12/21  9:36 AM   Specimen: Nasopharyngeal Swab  Result Value Ref Range Status   SARS Coronavirus 2 POSITIVE (A) NEGATIVE Final    Comment: RESULT CALLED TO, READ BACK BY AND VERIFIED WITH: ANNA JASPER ON 01/12/21 AT 1111 SDR (NOTE) SARS-CoV-2 target nucleic acids are DETECTED  SARS-CoV-2 RNA is generally detectable in upper respiratory specimens  during the acute phase of infection.  Positive results are indicative  of the presence of the identified virus, but do not rule out bacterial infection or co-infection with other pathogens  not detected by the test.  Clinical correlation with patient history and  other diagnostic information is necessary to determine patient infection status.  The expected result is negative.  Fact Sheet for Patients:   BoilerBrush.com.cy   Fact Sheet for Healthcare Providers:   https://pope.com/    This test is not yet approved or cleared by the [place] FDA and  has been authorized for detection and/or diagnosis of SARS-CoV-2 by FDA under an Emergency Use Authorization (EUA).  This EUA will remain in effect (meaning this tes t can be used) for the duration of  the COVID-19 declaration under Section 564(b)(1) of the Act, 21 U.S.C. section 360-bbb-3(b)(1), unless the authorization is terminated or revoked sooner.  Performed at [facility], [address] [place], [place] [address]   Culture, blood (single)     Status: None   Collection Time: 01/12/21  1:08 PM   Specimen: BLOOD  Result Value Ref Range Status   Specimen Description BLOOD BLOOD RIGHT HAND  Final   Special Requests   Final    BOTTLES DRAWN AEROBIC AND ANAEROBIC Blood Culture adequate volume   Culture   Final    NO GROWTH 5 DAYS Performed at [facility], [address] [place], [place] [address]    Report Status 01/17/2021 FINAL  Final  MRSA PCR Screening     Status: None   Collection Time: 01/12/21  7:22 PM   Specimen: Nasopharyngeal  Result Value Ref Range Status   MRSA by PCR NEGATIVE NEGATIVE Final    Comment:        The GeneXpert MRSA Assay (FDA approved for NASAL specimens only), is one component of a comprehensive MRSA colonization surveillance program. It is not intended to diagnose MRSA infection nor to guide or monitor treatment for MRSA infections. Performed at [facility], [address] [place], [place] [address]   Culture, respiratory (non-expectorated)     Status: None   Collection Time: 01/13/21  3:04 PM    Specimen: Tracheal Aspirate; Respiratory  Result Value Ref Range Status   Specimen Description   Final    TRACHEAL ASPIRATE Performed at [facility], [address], [place], [place] [address]    Special Requests   Final    NONE Performed at [facility], [address] [place], [place] [address]    Gram Stain   Final    ABUNDANT WBC PRESENT, PREDOMINANTLY PMN MODERATE GRAM POSITIVE COCCI Performed at [facility], [address] [place], [place] [address]    Culture  FEW STAPHYLOCOCCUS AUREUS  Final   Report Status 01/16/2021 FINAL  Final   Organism ID, Bacteria STAPHYLOCOCCUS AUREUS  Final      Susceptibility   Staphylococcus aureus - MIC*    CIPROFLOXACIN <=0.5 SENSITIVE Sensitive     ERYTHROMYCIN <=0.25 SENSITIVE Sensitive     GENTAMICIN <=0.5 SENSITIVE Sensitive     OXACILLIN 0.5 SENSITIVE Sensitive     TETRACYCLINE <=1 SENSITIVE Sensitive     VANCOMYCIN 1 SENSITIVE Sensitive     TRIMETH/SULFA <=10 SENSITIVE Sensitive     CLINDAMYCIN <=0.25 SENSITIVE Sensitive     RIFAMPIN <=0.5 SENSITIVE Sensitive     Inducible Clindamycin NEGATIVE Sensitive     * FEW STAPHYLOCOCCUS AUREUS  Culture, respiratory (non-expectorated)     Status: None   Collection Time: 01/19/21 10:41 PM   Specimen: Tracheal Aspirate; Respiratory  Result Value Ref Range Status   Specimen Description   Final    TRACHEAL ASPIRATE Performed at [facility], [address] [place], [place] [address]    Special Requests   Final    NONE Performed at [facility], [address] [place], [place] [address]    Gram Stain   Final    FEW WBC PRESENT,BOTH PMN AND MONONUCLEAR RARE BUDDING YEAST SEEN Performed at [facility], [address] [place], [place] [address]    Culture FEW CANDIDA ALBICANS  Final   Report Status 01/22/2021 FINAL  Final    Coagulation Studies: No results for input(s): LABPROT, INR in the last 72 hours.  Urinalysis: No  results for input(s): COLORURINE, LABSPEC, PHURINE, GLUCOSEU, HGBUR, BILIRUBINUR, KETONESUR, PROTEINUR, UROBILINOGEN, NITRITE, LEUKOCYTESUR in the last 72 hours.  Invalid input(s): APPERANCEUR    Imaging: No results found.   Medications:   . sodium chloride     . albuterol  1-2 puff Inhalation Q6H  . aspirin  81 mg Oral Daily  . atorvastatin  80 mg Oral Daily  . Chlorhexidine Gluconate Cloth  6 each Topical Daily  . clopidogrel  75 mg Oral Daily  . docusate sodium  100 mg Oral BID  . enoxaparin (LOVENOX) injection  0.5 mg/kg Subcutaneous Q24H  . famotidine  20 mg Oral Daily  . feeding supplement (NEPRO CARB STEADY)  237 mL Oral TID BM  . insulin aspart  0-20 Units Subcutaneous TID AC & HS  . insulin aspart  4 Units Subcutaneous TID WC  . insulin detemir  30 Units Subcutaneous Daily  . mouth rinse  15 mL Mouth Rinse BID  . metoprolol tartrate  25 mg Oral BID  . multivitamin with minerals  1 tablet Oral Daily  . polyethylene glycol  17 g Oral Daily  . predniSONE  40 mg Oral Q breakfast  . sodium chloride flush  3 mL Intravenous Q12H   sodium chloride, acetaminophen (TYLENOL) oral liquid 160 mg/5 mL, dextromethorphan-guaiFENesin, dextrose, hydrALAZINE, lip balm, metoprolol tartrate, ondansetron **OR** ondansetron (ZOFRAN) IV, ondansetron (ZOFRAN) IV, polyethylene glycol, sodium chloride flush  Assessment/ Plan:  [name] is a 30 y.o. black male with asthma, with no other past medical history, and no outpatient medications who was admitted to [facility] on 01/12/2021 for ARDS (adult respiratory distress syndrome) (HCC) [J80] Acute respiratory failure with hypoxia (HCC) [J96.01] Acute hypoxemic respiratory failure due to COVID-19 (HCC) [U07.1, J96.01] Hyperosmolar hyperglycemic state (HHS) (HCC) [E11.00, E11.65] Pneumonia due to COVID-19 virus [U07.1, J12.82]   Started on CRRT on admission. Discontinued on 2/9. Hemodynamically stable. Has not required intermittent hemodialysis  this admission.   1. Acute kidney injury: with no known creatinine baseline.  Continued improvement- Crt 2.15 Will continue to monitor No need for dialysis at this time  2. Acute respiratory failure:  Currently on room air  3. 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 Hemoglobin A1c of 13.6%. Maintains glucose controll on current treament  4. Anemia with kidney failure:  Continued improvement -Hgb-8.9   5. Hypernatremia:  - improved NA-151 -eating well   LOS: 17 Erik [name] 2/18/202210:32 AM

## 2021-01-29 NOTE — Progress Notes (Addendum)
Patient moved to 1C today, is on Airborne Precautions. Attempted call into patient's room, unable to reach patient. Asked RN to ask patient to call CSW.  Spoke with CIR Representative Estill Dooms, they may have a bed next week when Airborne Precautions are discharged, and will follow up Monday.   Alfonso Ramus, [place] [phone]

## 2021-01-30 LAB — CBC WITH DIFFERENTIAL/PLATELET
Abs Immature Granulocytes: 0.03 10*3/uL (ref 0.00–0.07)
Basophils Absolute: 0 10*3/uL (ref 0.0–0.1)
Basophils Relative: 0 %
Eosinophils Absolute: 0.2 10*3/uL (ref 0.0–0.5)
Eosinophils Relative: 3 %
HCT: 30.3 % — ABNORMAL LOW (ref 39.0–52.0)
Hemoglobin: 9.2 g/dL — ABNORMAL LOW (ref 13.0–17.0)
Immature Granulocytes: 0 %
Lymphocytes Relative: 26 %
Lymphs Abs: 2.1 10*3/uL (ref 0.7–4.0)
MCH: 26.7 pg (ref 26.0–34.0)
MCHC: 30.4 g/dL (ref 30.0–36.0)
MCV: 87.8 fL (ref 80.0–100.0)
Monocytes Absolute: 0.8 10*3/uL (ref 0.1–1.0)
Monocytes Relative: 9 %
Neutro Abs: 4.9 10*3/uL (ref 1.7–7.7)
Neutrophils Relative %: 62 %
Platelets: 268 10*3/uL (ref 150–400)
RBC: 3.45 MIL/uL — ABNORMAL LOW (ref 4.22–5.81)
RDW: 15.3 % (ref 11.5–15.5)
WBC: 8.1 10*3/uL (ref 4.0–10.5)
nRBC: 0 % (ref 0.0–0.2)

## 2021-01-30 LAB — COMPREHENSIVE METABOLIC PANEL
ALT: 18 U/L (ref 0–44)
AST: 13 U/L — ABNORMAL LOW (ref 15–41)
Albumin: 2.7 g/dL — ABNORMAL LOW (ref 3.5–5.0)
Alkaline Phosphatase: 58 U/L (ref 38–126)
Anion gap: 6 (ref 5–15)
BUN: 29 mg/dL — ABNORMAL HIGH (ref 6–20)
CO2: 26 mmol/L (ref 22–32)
Calcium: 8.7 mg/dL — ABNORMAL LOW (ref 8.9–10.3)
Chloride: 113 mmol/L — ABNORMAL HIGH (ref 98–111)
Creatinine, Ser: 1.96 mg/dL — ABNORMAL HIGH (ref 0.61–1.24)
GFR, Estimated: 47 mL/min — ABNORMAL LOW (ref >60)
Glucose, Bld: 144 mg/dL — ABNORMAL HIGH (ref 70–99)
Potassium: 4.2 mmol/L (ref 3.5–5.1)
Sodium: 145 mmol/L (ref 135–145)
Total Bilirubin: 0.5 mg/dL (ref 0.3–1.2)
Total Protein: 6.3 g/dL — ABNORMAL LOW (ref 6.5–8.1)

## 2021-01-30 LAB — MAGNESIUM: Magnesium: 1.6 mg/dL — ABNORMAL LOW (ref 1.7–2.4)

## 2021-01-30 LAB — GLUCOSE, CAPILLARY
Glucose-Capillary: 162 mg/dL — ABNORMAL HIGH (ref 70–99)
Glucose-Capillary: 171 mg/dL — ABNORMAL HIGH (ref 70–99)
Glucose-Capillary: 263 mg/dL — ABNORMAL HIGH (ref 70–99)
Glucose-Capillary: 212 mg/dL — ABNORMAL HIGH (ref 70–99)

## 2021-01-30 LAB — FERRITIN: Ferritin: 262 ng/mL (ref 24–336)

## 2021-01-30 LAB — C-REACTIVE PROTEIN: CRP: 0.7 mg/dL (ref <1.0)

## 2021-01-30 LAB — D-DIMER, QUANTITATIVE: D-Dimer, Quant: 1.73 ug/mL-FEU — ABNORMAL HIGH (ref 0.00–0.50)

## 2021-01-30 MED ORDER — GERHARDT'S BUTT CREAM
TOPICAL_CREAM | Freq: Two times a day (BID) | CUTANEOUS | Status: DC
  Filled 2021-01-30 (×2): qty 1

## 2021-01-30 MED ORDER — MAGNESIUM SULFATE 4 GM/100ML IV SOLN
4.0000 g | Freq: Once | INTRAVENOUS | Status: AC
  Administered 2021-01-30: 4 g via INTRAVENOUS
  Filled 2021-01-30: qty 100

## 2021-01-30 NOTE — Progress Notes (Signed)
  Speech Language Pathology Treatment: (P) Dysphagia  Patient Details Name: Erik [name] MRN: [id number] DOB: 04-16-1991 Today's Date: 01/30/2021 Time: (P) 1400-(P) 1420 SLP Time Calculation (min) (ACUTE ONLY): (P) 20 min  Assessment / Plan / Recommendation Clinical Impression  Patient seen for skilled ST treatment targeting dysphagia. Currently on room air, pt reports he has been doing well with meals. Intermittent coughing at baseline, prior to presentation of PO. Pt able to recall swallowing precautions when asked, able to self-feed and self-limits to small single sips via cup, slow rate as he consumed soft solids and thin liquids. No overt signs of aspiration with PO, oral stage appears WFL. Intermittent cough which does not appear attributable to decreased airway protection as this occurs when pt at rest, not consuming liquids or solids. Recommend continue current diet with precautions, now reposted at [facility] since room transfer. Dysphagia 3, thin liquids via cup, NO STRAW, pt must feed self, upright/out of bed for meals. Continue meds per pt preference (crushed in puree).    HPI HPI: Per admitting H&P " ROURKE MCQUITTY is a 30 y.o. male with medical history significant for not on previous prescription medications, has asthma however has not been a provider since he was a teenager.     He presents via the mother for chief concerns of weakness, poor p.o. intake, lethargy that has been ongoing for the last 2 weeks, 12/27/2020.  She denies fever at home.  She reports that he endorses nausea and vomiting.  He has had poor p.o. intake but has been drinking apple juice and orange juice" After presenting to the ER with progressive weakness, decreased PO intake, nausea/vomiting, he wasadmitted for management of acute hypoxic respiratory failure due to COVID-19 viral infection, DKA and metabolic encephalopathy.  Pt was emergently intubated 2/1, extubated 01/23/21.  Pt on RA.      SLP Plan  (P) Continue with  current plan of care       Recommendations  Diet recommendations: (P) Dysphagia 3 (mechanical soft);Thin liquid Liquids provided via: (P) Cup;No straw Medication Administration: (P) Crushed with puree Supervision: (P) Patient able to self feed;Intermittent supervision to cue for compensatory strategies Compensations: (P) Minimize environmental distractions;Slow rate;Small sips/bites;Lingual sweep for clearance of pocketing;Follow solids with liquid Postural Changes and/or Swallow Maneuvers: (P) Out of bed for meals;Seated upright 90 degrees;Upright 30-60 min after meal                Oral Care Recommendations: (P) Oral care BID;Patient independent with oral care;Staff/trained caregiver to provide oral care Follow up Recommendations: (P) Inpatient Rehab;Skilled Nursing facility SLP Visit Diagnosis: (P) Dysphagia, oropharyngeal phase (R13.12) Plan: (P) Continue with current plan of care       GO              Rondel Baton, MS, CCC-SLP Speech-Language Pathologist   Arlana Lindau 01/30/2021, 2:18 PM

## 2021-01-30 NOTE — Progress Notes (Signed)
[facility]  ROUNDING NOTE   Subjective:   Patient is laying in bed Alert and oriented Denies shortness of breath and chest pain Able to eat breakfast Denies nausea   Objective:  Vital signs in last 24 hours:  Temp:  [98.1 F (36.7 C)-99.2 F (37.3 C)] 98.4 F (36.9 C) (02/19 1148) Pulse Rate:  [101-106] 102 (02/19 1148) Resp:  [14-18] 16 (02/19 1148) BP: (120-153)/(75-99) 140/93 (02/19 1148) SpO2:  [95 %-99 %] 98 % (02/19 1148) Weight:  [167.4 kg] 167.4 kg (02/19 0500)  Weight change:  Filed Weights   01/26/21 0449 01/27/21 0537 01/30/21 0500  Weight: (!) 165.1 kg (!) 162.8 kg (!) 167.4 kg    Intake/Output: I/O last 3 completed shifts: In: -  Out: 1 [Urine:1]   Intake/Output this shift:  No intake/output data recorded.  Physical Exam: General: NAD, resting in bed  Head: Normocephalic, oral mucosa dry  Eyes: Anicteric  Neck:  trachea midline  Lungs:  Rhonchi-left base  Heart: tachycardia  Abdomen:  Soft, obese  Extremities:  no peripheral edema.  Neurologic: Alert, oriented, able to answer questions  Skin: No lesions  Access: none    Basic Metabolic Panel: Recent Labs  Lab 01/24/21 0615 01/25/21 0515 01/26/21 0651 01/28/21 1050 01/29/21 0515 01/30/21 0414  NA 148* 151* 152* 150* 151* 145  K 4.6 4.0 4.4 4.6 4.1 4.2  CL 114* 119* 121* 116* 119* 113*  CO2 20* 23 21* 24 24 26   GLUCOSE 159* 90 98 260* 135* 144*  BUN 67* 59* 53* 39* 33* 29*  CREATININE 3.15* 2.74* 2.53* 2.35* 2.15* 1.96*  CALCIUM 8.8* 8.9 8.8* 9.2 9.1 8.7*  MG 2.3 2.3 2.2 2.1 1.9 1.6*  PHOS 6.1* 4.8* 3.9  --   --   --     Liver Function Tests: Recent Labs  Lab 01/25/21 0515 01/26/21 0651 01/28/21 1050 01/29/21 0515 01/30/21 0414  AST  --   --  29 14* 13*  ALT  --   --  25 19 18   ALKPHOS  --   --  70 64 58  BILITOT  --   --  0.4 0.5 0.5  PROT  --   --  6.9 6.1* 6.3*  ALBUMIN 2.2* 2.5* 2.6* 2.4* 2.7*   No results for input(s): LIPASE, AMYLASE in the last  168 hours. No results for input(s): AMMONIA in the last 168 hours.  CBC: Recent Labs  Lab 01/26/21 0651 01/27/21 1105 01/28/21 1050 01/29/21 0515 01/30/21 0414  WBC 12.4* 8.8 9.6 9.0 8.1  NEUTROABS 8.7* 6.2 8.0* 5.8 4.9  HGB 8.8* 9.1* 9.6* 8.9* 9.2*  HCT 30.0* 30.7* 33.5* 30.9* 30.3*  MCV 88.0 88.5 88.6 89.3 87.8  PLT 350 350 337 310 268    Cardiac Enzymes: No results for input(s): CKTOTAL, CKMB, CKMBINDEX, TROPONINI in the last 168 hours.  BNP: Invalid input(s): POCBNP  CBG: Recent Labs  Lab 01/29/21 1208 01/29/21 1600 01/29/21 2025 01/30/21 0758 01/30/21 1149  GLUCAP 209* 204* 164* 162* 171*    Microbiology: Results for orders placed or performed during the hospital encounter of 01/12/21  Blood culture (routine single)     Status: None   Collection Time: 01/12/21  9:05 AM   Specimen: BLOOD  Result Value Ref Range Status   Specimen Description BLOOD LEFT AC  Final   Special Requests   Final    BOTTLES DRAWN AEROBIC ONLY Blood Culture results may not be optimal due to an inadequate volume of blood received  in culture bottles   Culture   Final    NO GROWTH 5 DAYS Performed at [facility], [address] [place], [place] [address]    Report Status 01/17/2021 FINAL  Final  SARS Coronavirus 2 by RT PCR (hospital order, performed in [facility] hospital lab) Nasopharyngeal Nasopharyngeal Swab     Status: Abnormal   Collection Time: 01/12/21  9:36 AM   Specimen: Nasopharyngeal Swab  Result Value Ref Range Status   SARS Coronavirus 2 POSITIVE (A) NEGATIVE Final    Comment: RESULT CALLED TO, READ BACK BY AND VERIFIED WITH: ANNA JASPER ON 01/12/21 AT 1111 SDR (NOTE) SARS-CoV-2 target nucleic acids are DETECTED  SARS-CoV-2 RNA is generally detectable in upper respiratory specimens  during the acute phase of infection.  Positive results are indicative  of the presence of the identified virus, but do not rule out bacterial infection or co-infection with  other pathogens not detected by the test.  Clinical correlation with patient history and  other diagnostic information is necessary to determine patient infection status.  The expected result is negative.  Fact Sheet for Patients:   BoilerBrush.com.cy   Fact Sheet for Healthcare Providers:   https://pope.com/    This test is not yet approved or cleared by the [place] FDA and  has been authorized for detection and/or diagnosis of SARS-CoV-2 by FDA under an Emergency Use Authorization (EUA).  This EUA will remain in effect (meaning this tes t can be used) for the duration of  the COVID-19 declaration under Section 564(b)(1) of the Act, 21 U.S.C. section 360-bbb-3(b)(1), unless the authorization is terminated or revoked sooner.  Performed at [facility], [address] [place], [place] [address]   Culture, blood (single)     Status: None   Collection Time: 01/12/21  1:08 PM   Specimen: BLOOD  Result Value Ref Range Status   Specimen Description BLOOD BLOOD RIGHT HAND  Final   Special Requests   Final    BOTTLES DRAWN AEROBIC AND ANAEROBIC Blood Culture adequate volume   Culture   Final    NO GROWTH 5 DAYS Performed at [facility], [address] [place], [place] [address]    Report Status 01/17/2021 FINAL  Final  MRSA PCR Screening     Status: None   Collection Time: 01/12/21  7:22 PM   Specimen: Nasopharyngeal  Result Value Ref Range Status   MRSA by PCR NEGATIVE NEGATIVE Final    Comment:        The GeneXpert MRSA Assay (FDA approved for NASAL specimens only), is one component of a comprehensive MRSA colonization surveillance program. It is not intended to diagnose MRSA infection nor to guide or monitor treatment for MRSA infections. Performed at [facility], [address] [place], [place] [address]   Culture, respiratory (non-expectorated)     Status: None   Collection Time:  01/13/21  3:04 PM   Specimen: Tracheal Aspirate; Respiratory  Result Value Ref Range Status   Specimen Description   Final    TRACHEAL ASPIRATE Performed at [facility], [address], [place], [place] [address]    Special Requests   Final    NONE Performed at [facility], [address] [place], [place] [address]    Gram Stain   Final    ABUNDANT WBC PRESENT, PREDOMINANTLY PMN MODERATE GRAM POSITIVE COCCI Performed at [facility], [address] [place], [place] [address]    Culture FEW  STAPHYLOCOCCUS AUREUS  Final   Report Status 01/16/2021 FINAL  Final   Organism ID, Bacteria STAPHYLOCOCCUS AUREUS  Final      Susceptibility   Staphylococcus aureus - MIC*    CIPROFLOXACIN <=0.5 SENSITIVE Sensitive     ERYTHROMYCIN <=0.25 SENSITIVE Sensitive     GENTAMICIN <=0.5 SENSITIVE Sensitive     OXACILLIN 0.5 SENSITIVE Sensitive     TETRACYCLINE <=1 SENSITIVE Sensitive     VANCOMYCIN 1 SENSITIVE Sensitive     TRIMETH/SULFA <=10 SENSITIVE Sensitive     CLINDAMYCIN <=0.25 SENSITIVE Sensitive     RIFAMPIN <=0.5 SENSITIVE Sensitive     Inducible Clindamycin NEGATIVE Sensitive     * FEW STAPHYLOCOCCUS AUREUS  Culture, respiratory (non-expectorated)     Status: None   Collection Time: 01/19/21 10:41 PM   Specimen: Tracheal Aspirate; Respiratory  Result Value Ref Range Status   Specimen Description   Final    TRACHEAL ASPIRATE Performed at [facility], [address] [place], [place] [address]    Special Requests   Final    NONE Performed at [facility], [address] [place], [place] [address]    Gram Stain   Final    FEW WBC PRESENT,BOTH PMN AND MONONUCLEAR RARE BUDDING YEAST SEEN Performed at [facility], [address], [place], [place] [address]    Culture FEW CANDIDA ALBICANS  Final   Report Status 01/22/2021 FINAL  Final    Coagulation Studies: No results for input(s): LABPROT, INR in the last 72  hours.  Urinalysis: No results for input(s): COLORURINE, LABSPEC, PHURINE, GLUCOSEU, HGBUR, BILIRUBINUR, KETONESUR, PROTEINUR, UROBILINOGEN, NITRITE, LEUKOCYTESUR in the last 72 hours.  Invalid input(s): APPERANCEUR    Imaging: No results found.   Medications:   . sodium chloride     . albuterol  1-2 puff Inhalation Q6H  . aspirin  81 mg Oral Daily  . atorvastatin  80 mg Oral Daily  . Chlorhexidine Gluconate Cloth  6 each Topical Daily  . clopidogrel  75 mg Oral Daily  . docusate sodium  100 mg Oral BID  . enoxaparin (LOVENOX) injection  0.5 mg/kg Subcutaneous Q24H  . famotidine  20 mg Oral Daily  . feeding supplement (NEPRO CARB STEADY)  237 mL Oral TID BM  . insulin aspart  0-20 Units Subcutaneous TID AC & HS  . insulin aspart  4 Units Subcutaneous TID WC  . insulin detemir  30 Units Subcutaneous Daily  . mouth rinse  15 mL Mouth Rinse BID  . metoprolol tartrate  25 mg Oral BID  . multivitamin with minerals  1 tablet Oral Daily  . polyethylene glycol  17 g Oral Daily  . predniSONE  40 mg Oral Q breakfast  . sodium chloride flush  3 mL Intravenous Q12H   sodium chloride, acetaminophen (TYLENOL) oral liquid 160 mg/5 mL, dextromethorphan-guaiFENesin, dextrose, hydrALAZINE, lip balm, metoprolol tartrate, ondansetron **OR** ondansetron (ZOFRAN) IV, ondansetron (ZOFRAN) IV, polyethylene glycol, sodium chloride flush  Assessment/ Plan:  [name] is a 30 y.o. black male with asthma, with no other past medical history, and no outpatient medications who was admitted to [facility] on 01/12/2021 for ARDS (adult respiratory distress syndrome) (HCC) [J80] Acute respiratory failure with hypoxia (HCC) [J96.01] Acute hypoxemic respiratory failure due to COVID-19 (HCC) [U07.1, J96.01] Hyperosmolar hyperglycemic state (HHS) (HCC) [E11.00, E11.65] Pneumonia due to COVID-19 virus [U07.1, J12.82]   Started on CRRT on admission. Discontinued on 2/9. Hemodynamically stable. Has not required  intermittent hemodialysis  this admission.   1. Acute kidney injury: with no known creatinine baseline.  Continued improvement- Crt 1.96 Will continue to monitor No acute need for dialysis at this time  2. Acute respiratory failure:  Currently on room air  3. 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 Hemoglobin A1c of 13.6%. Maintains glucose controll on current treament  4. Anemia with kidney failure:  Continued improvement -Hgb-9.2  5. Hypernatremia:  - improved NA-145 -Encouraged nutrition   LOS: 18 Shantelle Breeze 2/19/202212:27 PM

## 2021-01-30 NOTE — Progress Notes (Signed)
PROGRESS NOTE    Erik [name]  SNK:[id number] DOB: 06/16/1991 DOA: 01/12/2021 PCP: Pcp, No   Brief Narrative:   30 yo morbidly obese male with Asthma now with progressive multiorgan failure with COVID 19 pneumonia and ARDS with severe acidosis and severe hypoxia with ARDS with acute renal failure; now extubated and off renal replacement therapy.  Initially due to severe respiratory failure she was intubated was found to be in acute kidney injury started on CRRT.  She was found to be COVID-19 positive requiring treatment including remdesivir and steroids.  She was not deemed to be candidate for baricitinib/Actemra.  On 2/8C suffered from acute MI/STEMI therefore taken for left heart catheterization 2/9.  Successfully extubated on 2/11.  Assessment & Plan:   Principal Problem:   Acute hypoxemic respiratory failure due to COVID-19 [facility]) Active Problems:   DKA (diabetic ketoacidosis) (HCC)   Obesity, Class III, BMI 40-49.9 (morbid obesity) (HCC)   Acute metabolic encephalopathy   ARDS (adult respiratory distress syndrome) (HCC)   ST elevation myocardial infarction (STEMI) (HCC)   Acute respiratory failure with hypoxia (HCC)   VT (ventricular tachycardia) (HCC)   Elevated troponin   Acute hypoxemic respiratory failure due to severe COVID-19 pneumonia/ARDS 2/1 Rquire Mechanical ventilation  Successfully extubated 01/22/21 -currently on room air at rest Completed course of remdesivir and antibiotics. Due to suspected CAP no baricitinib/actemra Continue p.o. prednisone.  T the last day 2/21 Labs downtrending appropriately Last day of isolation 02/01/2021  AKI with acute renal failure, resolving -Initially patient was on CRRT which was discontinued on 2/9.  Currently hemodynamically stable.  Continue to monitor urine output and renal function. -Foley catheter discontinued.  Creatinine improving, 1.96 -No baseline renal function available -Nephrology following  Acute inferior STEMI with  associated diastolic heart failure and Vtach -Cardiology team is following.  Status post left heart cath 2/9-no significant CAD seen.  Suspect myocarditis.  -Echocardiogram 2/9-EF 60 to 65% -LDL 65. A1c is 11.8  Ambulatory dysfunction, profound  -Secondary to acute illness.  PT/OT-CIR   Profound hyperglycemia secondary to newly diagnosed uncontrolled diabetes - insulin dependent -Hemoglobin A1c-11.8 -Insulin sliding scale and Accu-Chek -Diabetic coordinator following  PT-CIR   DVT prophylaxis: Subcu heparin Code Status: Full code Family Communication: None at bedside  Status is: Inpatient  Remains inpatient appropriate because:Inpatient level of care appropriate due to severity of illness   Dispo: The patient is from: Home              Anticipated d/c is to: CIR              Anticipated d/c date is: 1-2 days              Patient currently is medically stable to be transition to CIR when bed available   Difficult to place patient No  Body mass index is 50.05 kg/m.       Subjective: Tolerating p.o. without any issues.  No complaints overnight.  Review of Systems Otherwise negative except as per HPI, including: General: Denies fever, chills, night sweats or unintended weight loss. Resp: Denies cough, wheezing, shortness of breath. Cardiac: Denies chest pain, palpitations, orthopnea, paroxysmal nocturnal dyspnea. GI: Denies abdominal pain, nausea, vomiting, diarrhea or constipation GU: Denies dysuria, frequency, hesitancy or incontinence MS: Denies muscle aches, joint pain or swelling Neuro: Denies headache, neurologic deficits (focal weakness, numbness, tingling), abnormal gait Psych: Denies anxiety, depression, SI/HI/AVH Skin: Denies new rashes or lesions ID: Denies sick contacts, exotic exposures, travel  Examination: Constitutional:  Not in acute distress.  Morbidly obese Respiratory: Clear to auscultation bilaterally Cardiovascular: Normal sinus rhythm, no  rubs Abdomen: Nontender nondistended good bowel sounds Musculoskeletal: No edema noted Skin: No rashes seen Neurologic: CN 2-12 grossly intact.  And nonfocal Psychiatric: Normal judgment and insight. Alert and oriented x 3. Normal mood. Rectal tube in place Objective: Vitals:   01/30/21 0021 01/30/21 0500 01/30/21 0613 01/30/21 0756  BP: 125/75  130/85 (!) 153/99  Pulse: (!) 103  (!) 101 (!) 102  Resp: 18  18 14   Temp: 99.2 F (37.3 C)  98.1 F (36.7 C) 98.3 F (36.8 C)  TempSrc: Oral  Oral Oral  SpO2: 95%  97% 97%  Weight:  (!) 167.4 kg    Height:        Intake/Output Summary (Last 24 hours) at 01/30/2021 1119 Last data filed at 01/29/2021 1500 Gross per 24 hour  Intake --  Output 1 ml  Net -1 ml   Filed Weights   01/26/21 0449 01/27/21 0537 01/30/21 0500  Weight: (!) 165.1 kg (!) 162.8 kg (!) 167.4 kg     Data Reviewed:   CBC: Recent Labs  Lab 01/26/21 0651 01/27/21 1105 01/28/21 1050 01/29/21 0515 01/30/21 0414  WBC 12.4* 8.8 9.6 9.0 8.1  NEUTROABS 8.7* 6.2 8.0* 5.8 4.9  HGB 8.8* 9.1* 9.6* 8.9* 9.2*  HCT 30.0* 30.7* 33.5* 30.9* 30.3*  MCV 88.0 88.5 88.6 89.3 87.8  PLT 350 350 337 310 268   Basic Metabolic Panel: Recent Labs  Lab 01/24/21 0615 01/25/21 0515 01/26/21 0651 01/28/21 1050 01/29/21 0515 01/30/21 0414  NA 148* 151* 152* 150* 151* 145  K 4.6 4.0 4.4 4.6 4.1 4.2  CL 114* 119* 121* 116* 119* 113*  CO2 20* 23 21* 24 24 26   GLUCOSE 159* 90 98 260* 135* 144*  BUN 67* 59* 53* 39* 33* 29*  CREATININE 3.15* 2.74* 2.53* 2.35* 2.15* 1.96*  CALCIUM 8.8* 8.9 8.8* 9.2 9.1 8.7*  MG 2.3 2.3 2.2 2.1 1.9 1.6*  PHOS 6.1* 4.8* 3.9  --   --   --    GFR: Estimated Creatinine Clearance: 89.3 mL/min (A) (by C-G formula based on SCr of 1.96 mg/dL (H)). Liver Function Tests: Recent Labs  Lab 01/25/21 0515 01/26/21 0651 01/28/21 1050 01/29/21 0515 01/30/21 0414  AST  --   --  29 14* 13*  ALT  --   --  25 19 18   ALKPHOS  --   --  70 64 58  BILITOT   --   --  0.4 0.5 0.5  PROT  --   --  6.9 6.1* 6.3*  ALBUMIN 2.2* 2.5* 2.6* 2.4* 2.7*   No results for input(s): LIPASE, AMYLASE in the last 168 hours. No results for input(s): AMMONIA in the last 168 hours. Coagulation Profile: No results for input(s): INR, PROTIME in the last 168 hours. Cardiac Enzymes: No results for input(s): CKTOTAL, CKMB, CKMBINDEX, TROPONINI in the last 168 hours. BNP (last 3 results) No results for input(s): PROBNP in the last 8760 hours. HbA1C: No results for input(s): HGBA1C in the last 72 hours. CBG: Recent Labs  Lab 01/29/21 0800 01/29/21 1208 01/29/21 1600 01/29/21 2025 01/30/21 0758  GLUCAP 138* 209* 204* 164* 162*   Lipid Profile: No results for input(s): CHOL, HDL, LDLCALC, TRIG, CHOLHDL, LDLDIRECT in the last 72 hours. Thyroid Function Tests: No results for input(s): TSH, T4TOTAL, FREET4, T3FREE, THYROIDAB in the last 72 hours. Anemia Panel: Recent Labs    01/29/21  0515 01/30/21 0414  FERRITIN 313 262   Sepsis Labs: No results for input(s): PROCALCITON, LATICACIDVEN in the last 168 hours.  No results found for this or any previous visit (from the past 240 hour(s)).       Radiology Studies: No results found.      Scheduled Meds: . albuterol  1-2 puff Inhalation Q6H  . aspirin  81 mg Oral Daily  . atorvastatin  80 mg Oral Daily  . Chlorhexidine Gluconate Cloth  6 each Topical Daily  . clopidogrel  75 mg Oral Daily  . docusate sodium  100 mg Oral BID  . enoxaparin (LOVENOX) injection  0.5 mg/kg Subcutaneous Q24H  . famotidine  20 mg Oral Daily  . feeding supplement (NEPRO CARB STEADY)  237 mL Oral TID BM  . insulin aspart  0-20 Units Subcutaneous TID AC & HS  . insulin aspart  4 Units Subcutaneous TID WC  . insulin detemir  30 Units Subcutaneous Daily  . mouth rinse  15 mL Mouth Rinse BID  . metoprolol tartrate  25 mg Oral BID  . multivitamin with minerals  1 tablet Oral Daily  . polyethylene glycol  17 g Oral Daily  .  predniSONE  40 mg Oral Q breakfast  . sodium chloride flush  3 mL Intravenous Q12H   Continuous Infusions: . sodium chloride    . magnesium sulfate bolus IVPB 4 g (01/30/21 0947)     LOS: 18 days   Time spent= 35 mins    Kainoah Bartosiewicz Joline Maxcy, MD [facility]  If 7PM-7AM, please contact night-coverage  01/30/2021, 11:19 AM

## 2021-01-31 LAB — GLUCOSE, CAPILLARY
Glucose-Capillary: 121 mg/dL — ABNORMAL HIGH (ref 70–99)
Glucose-Capillary: 151 mg/dL — ABNORMAL HIGH (ref 70–99)
Glucose-Capillary: 275 mg/dL — ABNORMAL HIGH (ref 70–99)
Glucose-Capillary: 269 mg/dL — ABNORMAL HIGH (ref 70–99)

## 2021-01-31 LAB — CBC WITH DIFFERENTIAL/PLATELET
Abs Immature Granulocytes: 0.07 10*3/uL (ref 0.00–0.07)
Basophils Absolute: 0 10*3/uL (ref 0.0–0.1)
Basophils Relative: 0 %
Eosinophils Absolute: 0.1 10*3/uL (ref 0.0–0.5)
Eosinophils Relative: 2 %
HCT: 26.9 % — ABNORMAL LOW (ref 39.0–52.0)
Hemoglobin: 8.3 g/dL — ABNORMAL LOW (ref 13.0–17.0)
Immature Granulocytes: 1 %
Lymphocytes Relative: 25 %
Lymphs Abs: 2.3 10*3/uL (ref 0.7–4.0)
MCH: 26.4 pg (ref 26.0–34.0)
MCHC: 30.9 g/dL (ref 30.0–36.0)
MCV: 85.7 fL (ref 80.0–100.0)
Monocytes Absolute: 0.9 10*3/uL (ref 0.1–1.0)
Monocytes Relative: 10 %
Neutro Abs: 5.9 10*3/uL (ref 1.7–7.7)
Neutrophils Relative %: 62 %
Platelets: 256 10*3/uL (ref 150–400)
RBC: 3.14 MIL/uL — ABNORMAL LOW (ref 4.22–5.81)
RDW: 14.7 % (ref 11.5–15.5)
WBC: 9.4 10*3/uL (ref 4.0–10.5)
nRBC: 0 % (ref 0.0–0.2)

## 2021-01-31 LAB — MAGNESIUM: Magnesium: 2.1 mg/dL (ref 1.7–2.4)

## 2021-01-31 LAB — FERRITIN: Ferritin: 285 ng/mL (ref 24–336)

## 2021-01-31 LAB — C-REACTIVE PROTEIN: CRP: 0.8 mg/dL (ref <1.0)

## 2021-01-31 LAB — D-DIMER, QUANTITATIVE: D-Dimer, Quant: 1.52 ug/mL-FEU — ABNORMAL HIGH (ref 0.00–0.50)

## 2021-01-31 NOTE — Progress Notes (Signed)
PROGRESS NOTE    Erik [name]  YDX:[id number] DOB: 04/01/1991 DOA: 01/12/2021 PCP: Pcp, No   Brief Narrative:   30 yo morbidly obese male with Asthma now with progressive multiorgan failure with COVID 19 pneumonia and ARDS with severe acidosis and severe hypoxia with ARDS with acute renal failure; now extubated and off renal replacement therapy.  Initially due to severe respiratory failure she was intubated was found to be in acute kidney injury started on CRRT.  She was found to be COVID-19 positive requiring treatment including remdesivir and steroids.  She was not deemed to be candidate for baricitinib/Actemra.  On 2/8C suffered from acute MI/STEMI therefore taken for left heart catheterization 2/9.  Successfully extubated on 2/11.  Assessment & Plan:   Principal Problem:   Acute hypoxemic respiratory failure due to COVID-19 [facility]) Active Problems:   DKA (diabetic ketoacidosis) (HCC)   Obesity, Class III, BMI 40-49.9 (morbid obesity) (HCC)   Acute metabolic encephalopathy   ARDS (adult respiratory distress syndrome) (HCC)   ST elevation myocardial infarction (STEMI) (HCC)   Acute respiratory failure with hypoxia (HCC)   VT (ventricular tachycardia) (HCC)   Elevated troponin   Acute hypoxemic respiratory failure due to severe COVID-19 pneumonia/ARDS 2/1 Rquire Mechanical ventilation  Successfully extubated 01/22/21 -currently on room air at rest Completed course of remdesivir and antibiotics. Due to suspected CAP no baricitinib/actemra Continue p.o. prednisone.  Last day 2/21 Should come off isolation tonight  AKI with acute renal failure, resolving -Initially patient was on CRRT which was discontinued on 2/9.  Currently hemodynamically stable.  Continue to monitor urine output and renal function. -Foley catheter discontinued.  Creatinine improving. -No baseline renal function available -Nephrology following  Acute inferior STEMI with associated diastolic heart failure and  Vtach -Cardiology team is following.  Status post left heart cath 2/9-no significant CAD seen.  Suspect myocarditis.  -Echocardiogram 2/9-EF 60 to 65% -LDL 65. A1c is 11.8  Ambulatory dysfunction, profound  -Secondary to acute illness.  PT/OT-recommending CIR   Profound hyperglycemia secondary to newly diagnosed uncontrolled diabetes - insulin dependent -Hemoglobin A1c-11.8 -Insulin sliding scale and Accu-Chek -Diabetic coordinator following  PT-CIR   DVT prophylaxis: Subcu heparin Code Status: Full code Family Communication: None  Status is: Inpatient  Remains inpatient appropriate because:Inpatient level of care appropriate due to severity of illness   Dispo: The patient is from: Home              Anticipated d/c is to: CIR              Anticipated d/c date is: 1-2 days              Patient currently is medically stable to be transition to CIR when bed is available.   Difficult to place patient No  Body mass index is 50.45 kg/m.       Subjective: Patient is tolerating orals without any issues.  No acute events overnight.  Does not have any complaints.  Review of Systems Otherwise negative except as per HPI, including: General: Denies fever, chills, night sweats or unintended weight loss. Resp: Denies cough, wheezing, shortness of breath. Cardiac: Denies chest pain, palpitations, orthopnea, paroxysmal nocturnal dyspnea. GI: Denies abdominal pain, nausea, vomiting, diarrhea or constipation GU: Denies dysuria, frequency, hesitancy or incontinence MS: Denies muscle aches, joint pain or swelling Neuro: Denies headache, neurologic deficits (focal weakness, numbness, tingling), abnormal gait Psych: Denies anxiety, depression, SI/HI/AVH Skin: Denies new rashes or lesions ID: Denies sick contacts, exotic exposures, travel  Examination: Constitutional: Not in acute distress.  Morbidly obese Respiratory: Clear to auscultation bilaterally Cardiovascular: Normal sinus  rhythm, no rubs Abdomen: Nontender nondistended good bowel sounds Musculoskeletal: No edema noted Skin: No rashes seen Neurologic: CN 2-12 grossly intact.  And nonfocal Psychiatric: Normal judgment and insight. Alert and oriented x 3. Normal mood. Rectal tube in place Objective: Vitals:   01/31/21 0017 01/31/21 0500 01/31/21 0547 01/31/21 0700  BP: 128/86  123/75 126/79  Pulse: 98  94 96  Resp: 18  18 18   Temp: 98.7 F (37.1 C)  97.9 F (36.6 C) 97.8 F (36.6 C)  TempSrc: Oral  Oral Oral  SpO2: 99%  97% 98%  Weight:  (!) 168.7 kg    Height:        Intake/Output Summary (Last 24 hours) at 01/31/2021 1109 Last data filed at 01/30/2021 2030 Gross per 24 hour  Intake 363 ml  Output 200 ml  Net 163 ml   Filed Weights   01/27/21 0537 01/30/21 0500 01/31/21 0500  Weight: (!) 162.8 kg (!) 167.4 kg (!) 168.7 kg     Data Reviewed:   CBC: Recent Labs  Lab 01/27/21 1105 01/28/21 1050 01/29/21 0515 01/30/21 0414 01/31/21 0402  WBC 8.8 9.6 9.0 8.1 9.4  NEUTROABS 6.2 8.0* 5.8 4.9 5.9  HGB 9.1* 9.6* 8.9* 9.2* 8.3*  HCT 30.7* 33.5* 30.9* 30.3* 26.9*  MCV 88.5 88.6 89.3 87.8 85.7  PLT 350 337 310 268 256   Basic Metabolic Panel: Recent Labs  Lab 01/25/21 0515 01/26/21 0651 01/28/21 1050 01/29/21 0515 01/30/21 0414 01/31/21 0402  NA 151* 152* 150* 151* 145  --   K 4.0 4.4 4.6 4.1 4.2  --   CL 119* 121* 116* 119* 113*  --   CO2 23 21* 24 24 26   --   GLUCOSE 90 98 260* 135* 144*  --   BUN 59* 53* 39* 33* 29*  --   CREATININE 2.74* 2.53* 2.35* 2.15* 1.96*  --   CALCIUM 8.9 8.8* 9.2 9.1 8.7*  --   MG 2.3 2.2 2.1 1.9 1.6* 2.1  PHOS 4.8* 3.9  --   --   --   --    GFR: Estimated Creatinine Clearance: 89.7 mL/min (A) (by C-G formula based on SCr of 1.96 mg/dL (H)). Liver Function Tests: Recent Labs  Lab 01/25/21 0515 01/26/21 0651 01/28/21 1050 01/29/21 0515 01/30/21 0414  AST  --   --  29 14* 13*  ALT  --   --  25 19 18   ALKPHOS  --   --  70 64 58  BILITOT   --   --  0.4 0.5 0.5  PROT  --   --  6.9 6.1* 6.3*  ALBUMIN 2.2* 2.5* 2.6* 2.4* 2.7*   No results for input(s): LIPASE, AMYLASE in the last 168 hours. No results for input(s): AMMONIA in the last 168 hours. Coagulation Profile: No results for input(s): INR, PROTIME in the last 168 hours. Cardiac Enzymes: No results for input(s): CKTOTAL, CKMB, CKMBINDEX, TROPONINI in the last 168 hours. BNP (last 3 results) No results for input(s): PROBNP in the last 8760 hours. HbA1C: No results for input(s): HGBA1C in the last 72 hours. CBG: Recent Labs  Lab 01/30/21 0758 01/30/21 1149 01/30/21 1542 01/30/21 2059 01/31/21 0812  GLUCAP 162* 171* 263* 212* 121*   Lipid Profile: No results for input(s): CHOL, HDL, LDLCALC, TRIG, CHOLHDL, LDLDIRECT in the last 72 hours. Thyroid Function Tests: No results for input(s): TSH,  T4TOTAL, FREET4, T3FREE, THYROIDAB in the last 72 hours. Anemia Panel: Recent Labs    01/30/21 0414 01/31/21 0402  FERRITIN 262 285   Sepsis Labs: No results for input(s): PROCALCITON, LATICACIDVEN in the last 168 hours.  No results found for this or any previous visit (from the past 240 hour(s)).       Radiology Studies: No results found.      Scheduled Meds: . albuterol  1-2 puff Inhalation Q6H  . aspirin  81 mg Oral Daily  . atorvastatin  80 mg Oral Daily  . Chlorhexidine Gluconate Cloth  6 each Topical Daily  . clopidogrel  75 mg Oral Daily  . docusate sodium  100 mg Oral BID  . enoxaparin (LOVENOX) injection  0.5 mg/kg Subcutaneous Q24H  . famotidine  20 mg Oral Daily  . feeding supplement (NEPRO CARB STEADY)  237 mL Oral TID BM  . Gerhardt's butt cream   Topical BID  . insulin aspart  0-20 Units Subcutaneous TID AC & HS  . insulin aspart  4 Units Subcutaneous TID WC  . insulin detemir  30 Units Subcutaneous Daily  . mouth rinse  15 mL Mouth Rinse BID  . metoprolol tartrate  25 mg Oral BID  . multivitamin with minerals  1 tablet Oral Daily  .  polyethylene glycol  17 g Oral Daily  . predniSONE  40 mg Oral Q breakfast  . sodium chloride flush  3 mL Intravenous Q12H   Continuous Infusions: . sodium chloride       LOS: 19 days   Time spent= 35 mins    Nailea Whitehorn Joline Maxcy, MD [facility]  If 7PM-7AM, please contact night-coverage  01/31/2021, 11:09 AM

## 2021-01-31 NOTE — Progress Notes (Signed)
[facility]  ROUNDING NOTE   Subjective:   Patient sitting up in bed eating breakfast Alert and oriented Able to eat meals without nausea Denies shortness of breath and chest pain Says he is trying to drink more water   Objective:  Vital signs in last 24 hours:  Temp:  [97.8 F (36.6 C)-99.6 F (37.6 C)] 97.8 F (36.6 C) (02/20 0700) Pulse Rate:  [94-106] 96 (02/20 0700) Resp:  [14-18] 18 (02/20 0700) BP: (123-140)/(75-93) 126/79 (02/20 0700) SpO2:  [96 %-99 %] 98 % (02/20 0700) Weight:  [168.7 kg] 168.7 kg (02/20 0500)  Weight change: 1.361 kg Filed Weights   01/27/21 0537 01/30/21 0500 01/31/21 0500  Weight: (!) 162.8 kg (!) 167.4 kg (!) 168.7 kg    Intake/Output: I/O last 3 completed shifts: In: 363 [P.O.:218; Other:45; IV Piggyback:100] Out: 200 [Stool:200]   Intake/Output this shift:  No intake/output data recorded.  Physical Exam: General: NAD, resting in bed  Head: Normocephalic, oral mucosa dry  Eyes: Anicteric  Neck:  trachea midline  Lungs:  Diminished in bases  Heart: Regular rate and rhythm  Abdomen:  Soft, obese  Extremities:  no peripheral edema.  Neurologic: Alert, oriented, able to answer questions  Skin: No lesions  Access: none    Basic Metabolic Panel: Recent Labs  Lab 01/25/21 0515 01/26/21 0651 01/28/21 1050 01/29/21 0515 01/30/21 0414 01/31/21 0402  NA 151* 152* 150* 151* 145  --   K 4.0 4.4 4.6 4.1 4.2  --   CL 119* 121* 116* 119* 113*  --   CO2 23 21* 24 24 26   --   GLUCOSE 90 98 260* 135* 144*  --   BUN 59* 53* 39* 33* 29*  --   CREATININE 2.74* 2.53* 2.35* 2.15* 1.96*  --   CALCIUM 8.9 8.8* 9.2 9.1 8.7*  --   MG 2.3 2.2 2.1 1.9 1.6* 2.1  PHOS 4.8* 3.9  --   --   --   --     Liver Function Tests: Recent Labs  Lab 01/25/21 0515 01/26/21 0651 01/28/21 1050 01/29/21 0515 01/30/21 0414  AST  --   --  29 14* 13*  ALT  --   --  25 19 18   ALKPHOS  --   --  70 64 58  BILITOT  --   --  0.4 0.5 0.5  PROT   --   --  6.9 6.1* 6.3*  ALBUMIN 2.2* 2.5* 2.6* 2.4* 2.7*   No results for input(s): LIPASE, AMYLASE in the last 168 hours. No results for input(s): AMMONIA in the last 168 hours.  CBC: Recent Labs  Lab 01/27/21 1105 01/28/21 1050 01/29/21 0515 01/30/21 0414 01/31/21 0402  WBC 8.8 9.6 9.0 8.1 9.4  NEUTROABS 6.2 8.0* 5.8 4.9 5.9  HGB 9.1* 9.6* 8.9* 9.2* 8.3*  HCT 30.7* 33.5* 30.9* 30.3* 26.9*  MCV 88.5 88.6 89.3 87.8 85.7  PLT 350 337 310 268 256    Cardiac Enzymes: No results for input(s): CKTOTAL, CKMB, CKMBINDEX, TROPONINI in the last 168 hours.  BNP: Invalid input(s): POCBNP  CBG: Recent Labs  Lab 01/30/21 0758 01/30/21 1149 01/30/21 1542 01/30/21 2059 01/31/21 0812  GLUCAP 162* 171* 263* 212* 121*    Microbiology: Results for orders placed or performed during the hospital encounter of 01/12/21  Blood culture (routine single)     Status: None   Collection Time: 01/12/21  9:05 AM   Specimen: BLOOD  Result Value Ref Range Status  Specimen Description BLOOD LEFT AC  Final   Special Requests   Final    BOTTLES DRAWN AEROBIC ONLY Blood Culture results may not be optimal due to an inadequate volume of blood received in culture bottles   Culture   Final    NO GROWTH 5 DAYS Performed at [facility], [address] [place], [place] [address]    Report Status 01/17/2021 FINAL  Final  SARS Coronavirus 2 by RT PCR (hospital order, performed in [facility] hospital lab) Nasopharyngeal Nasopharyngeal Swab     Status: Abnormal   Collection Time: 01/12/21  9:36 AM   Specimen: Nasopharyngeal Swab  Result Value Ref Range Status   SARS Coronavirus 2 POSITIVE (A) NEGATIVE Final    Comment: RESULT CALLED TO, READ BACK BY AND VERIFIED WITH: ANNA JASPER ON 01/12/21 AT 1111 SDR (NOTE) SARS-CoV-2 target nucleic acids are DETECTED  SARS-CoV-2 RNA is generally detectable in upper respiratory specimens  during the acute phase of infection.  Positive results are  indicative  of the presence of the identified virus, but do not rule out bacterial infection or co-infection with other pathogens not detected by the test.  Clinical correlation with patient history and  other diagnostic information is necessary to determine patient infection status.  The expected result is negative.  Fact Sheet for Patients:   BoilerBrush.com.cy   Fact Sheet for Healthcare Providers:   https://pope.com/    This test is not yet approved or cleared by the [place] FDA and  has been authorized for detection and/or diagnosis of SARS-CoV-2 by FDA under an Emergency Use Authorization (EUA).  This EUA will remain in effect (meaning this tes t can be used) for the duration of  the COVID-19 declaration under Section 564(b)(1) of the Act, 21 U.S.C. section 360-bbb-3(b)(1), unless the authorization is terminated or revoked sooner.  Performed at [facility], [address] [place], [place] [address]   Culture, blood (single)     Status: None   Collection Time: 01/12/21  1:08 PM   Specimen: BLOOD  Result Value Ref Range Status   Specimen Description BLOOD BLOOD RIGHT HAND  Final   Special Requests   Final    BOTTLES DRAWN AEROBIC AND ANAEROBIC Blood Culture adequate volume   Culture   Final    NO GROWTH 5 DAYS Performed at [facility], [address] [place], [place] [address]    Report Status 01/17/2021 FINAL  Final  MRSA PCR Screening     Status: None   Collection Time: 01/12/21  7:22 PM   Specimen: Nasopharyngeal  Result Value Ref Range Status   MRSA by PCR NEGATIVE NEGATIVE Final    Comment:        The GeneXpert MRSA Assay (FDA approved for NASAL specimens only), is one component of a comprehensive MRSA colonization surveillance program. It is not intended to diagnose MRSA infection nor to guide or monitor treatment for MRSA infections. Performed at [facility], [address] [place], [place] [address]   Culture, respiratory (non-expectorated)     Status: None   Collection Time: 01/13/21  3:04 PM   Specimen: Tracheal Aspirate; Respiratory  Result Value Ref Range Status   Specimen Description   Final    TRACHEAL ASPIRATE Performed at [facility], [address] [place], [place] [address]    Special Requests   Final    NONE Performed at [facility], [address] [place], [place] [address]  Gram Stain   Final    ABUNDANT WBC PRESENT, PREDOMINANTLY PMN MODERATE GRAM POSITIVE COCCI Performed at [facility], [address] [place], [place] [address]    Culture FEW STAPHYLOCOCCUS AUREUS  Final   Report Status 01/16/2021 FINAL  Final   Organism ID, Bacteria STAPHYLOCOCCUS AUREUS  Final      Susceptibility   Staphylococcus aureus - MIC*    CIPROFLOXACIN <=0.5 SENSITIVE Sensitive     ERYTHROMYCIN <=0.25 SENSITIVE Sensitive     GENTAMICIN <=0.5 SENSITIVE Sensitive     OXACILLIN 0.5 SENSITIVE Sensitive     TETRACYCLINE <=1 SENSITIVE Sensitive     VANCOMYCIN 1 SENSITIVE Sensitive     TRIMETH/SULFA <=10 SENSITIVE Sensitive     CLINDAMYCIN <=0.25 SENSITIVE Sensitive     RIFAMPIN <=0.5 SENSITIVE Sensitive     Inducible Clindamycin NEGATIVE Sensitive     * FEW STAPHYLOCOCCUS AUREUS  Culture, respiratory (non-expectorated)     Status: None   Collection Time: 01/19/21 10:41 PM   Specimen: Tracheal Aspirate; Respiratory  Result Value Ref Range Status   Specimen Description   Final    TRACHEAL ASPIRATE Performed at [facility], [address] [place], [place] [address]    Special Requests   Final    NONE Performed at [facility], [address] [place], [place] [address]    Gram Stain   Final    FEW WBC PRESENT,BOTH PMN AND MONONUCLEAR RARE BUDDING YEAST SEEN Performed at [facility], [address] [place], [place] [address]    Culture FEW CANDIDA ALBICANS  Final   Report  Status 01/22/2021 FINAL  Final    Coagulation Studies: No results for input(s): LABPROT, INR in the last 72 hours.  Urinalysis: No results for input(s): COLORURINE, LABSPEC, PHURINE, GLUCOSEU, HGBUR, BILIRUBINUR, KETONESUR, PROTEINUR, UROBILINOGEN, NITRITE, LEUKOCYTESUR in the last 72 hours.  Invalid input(s): APPERANCEUR    Imaging: No results found.   Medications:   . sodium chloride     . albuterol  1-2 puff Inhalation Q6H  . aspirin  81 mg Oral Daily  . atorvastatin  80 mg Oral Daily  . Chlorhexidine Gluconate Cloth  6 each Topical Daily  . clopidogrel  75 mg Oral Daily  . docusate sodium  100 mg Oral BID  . enoxaparin (LOVENOX) injection  0.5 mg/kg Subcutaneous Q24H  . famotidine  20 mg Oral Daily  . feeding supplement (NEPRO CARB STEADY)  237 mL Oral TID BM  . Gerhardt's butt cream   Topical BID  . insulin aspart  0-20 Units Subcutaneous TID AC & HS  . insulin aspart  4 Units Subcutaneous TID WC  . insulin detemir  30 Units Subcutaneous Daily  . mouth rinse  15 mL Mouth Rinse BID  . metoprolol tartrate  25 mg Oral BID  . multivitamin with minerals  1 tablet Oral Daily  . polyethylene glycol  17 g Oral Daily  . predniSONE  40 mg Oral Q breakfast  . sodium chloride flush  3 mL Intravenous Q12H   sodium chloride, acetaminophen (TYLENOL) oral liquid 160 mg/5 mL, dextromethorphan-guaiFENesin, dextrose, hydrALAZINE, lip balm, metoprolol tartrate, ondansetron **OR** ondansetron (ZOFRAN) IV, ondansetron (ZOFRAN) IV, polyethylene glycol, sodium chloride flush  Assessment/ Plan:  Erik [name] is a 30 y.o. black male with asthma, with no other past medical history, and no outpatient medications who was admitted to [facility] on 01/12/2021 for ARDS (adult respiratory distress syndrome) (HCC) [J80] Acute respiratory failure with hypoxia (  HCC) [J96.01] Acute hypoxemic respiratory failure due to COVID-19 (HCC) [U07.1, J96.01] Hyperosmolar hyperglycemic state (HHS) (HCC) [E11.00,  E11.65] Pneumonia due to COVID-19 virus [U07.1, J12.82]   Started on CRRT on admission. Discontinued on 2/9. Hemodynamically stable. Has not required intermittent hemodialysis this admission.   1. Acute kidney injury: with no known creatinine baseline.  Continued improvement- Crt 1.96 Improved renal function Able to urinate No acute need for dialysis at this time  2. Acute respiratory failure:  Currently on room air  3. 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 Hemoglobin A1c of 13.6%.  Maintains glucose control on current treament  4. Anemia with kidney failure:  Continued improvement -Hgb-9.2  5. Hypernatremia:  - improved NA-145 -Encouraged oral nutrition   LOS: 19 Shantelle Breeze 2/20/202210:02 AM

## 2021-02-01 LAB — BASIC METABOLIC PANEL
Anion gap: 8 (ref 5–15)
BUN: 25 mg/dL — ABNORMAL HIGH (ref 6–20)
CO2: 24 mmol/L (ref 22–32)
Calcium: 8.5 mg/dL — ABNORMAL LOW (ref 8.9–10.3)
Chloride: 107 mmol/L (ref 98–111)
Creatinine, Ser: 1.73 mg/dL — ABNORMAL HIGH (ref 0.61–1.24)
GFR, Estimated: 54 mL/min — ABNORMAL LOW (ref >60)
Glucose, Bld: 146 mg/dL — ABNORMAL HIGH (ref 70–99)
Potassium: 4.1 mmol/L (ref 3.5–5.1)
Sodium: 139 mmol/L (ref 135–145)

## 2021-02-01 LAB — GLUCOSE, CAPILLARY
Glucose-Capillary: 117 mg/dL — ABNORMAL HIGH (ref 70–99)
Glucose-Capillary: 189 mg/dL — ABNORMAL HIGH (ref 70–99)
Glucose-Capillary: 262 mg/dL — ABNORMAL HIGH (ref 70–99)
Glucose-Capillary: 232 mg/dL — ABNORMAL HIGH (ref 70–99)

## 2021-02-01 LAB — MAGNESIUM: Magnesium: 2 mg/dL (ref 1.7–2.4)

## 2021-02-01 MED ORDER — METOPROLOL TARTRATE 25 MG PO TABS
25.0000 mg | ORAL_TABLET | Freq: Once | ORAL | Status: AC
  Administered 2021-02-01: 11:00:00 25 mg via ORAL
  Filled 2021-02-01: qty 1

## 2021-02-01 MED ORDER — INSULIN ASPART 100 UNIT/ML ~~LOC~~ SOLN
6.0000 [IU] | Freq: Three times a day (TID) | SUBCUTANEOUS | Status: DC
  Administered 2021-02-01 (×2): 6 [IU] via SUBCUTANEOUS
  Filled 2021-02-01: qty 1

## 2021-02-01 MED ORDER — INSULIN DETEMIR 100 UNIT/ML ~~LOC~~ SOLN
33.0000 [IU] | Freq: Every day | SUBCUTANEOUS | Status: DC
  Filled 2021-02-01: qty 0.33

## 2021-02-01 MED ORDER — METOPROLOL TARTRATE 50 MG PO TABS
50.0000 mg | ORAL_TABLET | Freq: Two times a day (BID) | ORAL | Status: DC
  Administered 2021-02-01 – 2021-02-03 (×4): 50 mg via ORAL
  Filled 2021-02-01 (×4): qty 1

## 2021-02-01 MED ORDER — ENSURE MAX PROTEIN PO LIQD
11.0000 [oz_av] | Freq: Two times a day (BID) | ORAL | Status: DC
  Administered 2021-02-02 – 2021-02-03 (×3): 11 [oz_av] via ORAL
  Filled 2021-02-01: qty 330

## 2021-02-01 MED ORDER — LIVING WELL WITH DIABETES BOOK
Freq: Once | Status: DC
  Filled 2021-02-01 (×2): qty 1

## 2021-02-01 MED ORDER — INSULIN STARTER KIT- PEN NEEDLES (ENGLISH)
1.0000 | Freq: Once | Status: AC
  Administered 2021-02-01: 1
  Filled 2021-02-01: qty 1

## 2021-02-01 NOTE — Progress Notes (Signed)
   01/31/21 1945  Assess: MEWS Score  Temp 98 F (36.7 C)  BP (!) 159/95  Pulse Rate (!) 119  Resp 16  Level of Consciousness Alert  SpO2 97 %  O2 Device Room Air  Assess: MEWS Score  MEWS Temp 0  MEWS Systolic 0  MEWS Pulse 2  MEWS RR 0  MEWS LOC 0  MEWS Score 2  MEWS Score Color Yellow  Assess: if the MEWS score is Yellow or Red  Were vital signs taken at a resting state? Yes  Focused Assessment No change from prior assessment  Early Detection of Sepsis Score *See Row Information* Low  MEWS guidelines implemented *See Row Information* Yes  Treat  MEWS Interventions Administered scheduled meds/treatments  Pain Scale 0-10  Pain Score 0  Constipation interventions Laxative  Take Vital Signs  Increase Vital Sign Frequency  Yellow: Q 2hr X 2 then Q 4hr X 2, if remains yellow, continue Q 4hrs  Escalate  MEWS: Escalate Yellow: discuss with charge nurse/RN and consider discussing with provider and RRT  Notify: Charge Nurse/RN  Name of Charge Nurse/RN Notified Phyllis RN  Date Charge Nurse/RN Notified 01/31/21  Time Charge Nurse/RN Notified 1956  Notify: Provider  Provider Name/Title E. Anna Genre  Date Provider Notified 01/31/21  Time Provider Notified 1958  Notification Type Page  Notification Reason Change in status   Scheduled PO metoprolol administered, pt stable.

## 2021-02-01 NOTE — Progress Notes (Signed)
Physical Therapy Treatment Patient Details Name: Erik [name] MRN: [id number] DOB: Aug 31, 1991 Today's Date: 02/01/2021    History of Present Illness presented to ER secondary to progressive weakness, decreased PO intake, nausea/vomiting; admitted for management of acute hypoxic respiratory failure due to COVID-19 viral infection, DKA and metabolic encephalopathy.  Hospital course significant for emergent intubation 2/1-2/12/22; CRRT 2/3-01/20/21 via L temporary IJ dialysis catheter (R femoral removed); code STEMI with emergent cardiac cath (showing no significant coronary disease) 01/20/21 with elevated troponin associated with myocarditis.    PT Comments    Patient is making excellent progress towards meeting PT goals with increased activity tolerance and independence with functional activity this session. Multiple transfers performed this session. Sit to stand transfer performed x 4 bouts from bed with short rest break between bouts of standing with and without bariatric rolling walker. Patient demonstrates weight acceptance in BLE with +3 person assistance required during transfer for safety and body habitus. Difficulty with trunk extension in standing despite verbal cues, however patient is able to clear or partially clear hips from bed with transfers. Lateral scooting performed towards right side with verbal cues for technique and +2 person assistance. Patient also performed incremental squat pivot transfer from bed to chair with +3 person assistance for safety and body habitus.  Heart rate noted to be up to 144bpm with activity, however decreased into the 120's with short rest breaks. Patient remains motivated and demonstrates consistent progress with functional mobility with PT treatment interventions. CIR is highly recommended at this time. PT will continue to follow to address functional limitations listed below.    Follow Up Recommendations  CIR     Equipment Recommendations   (to be  determined at next level of care)    Recommendations for Other Services       Precautions / Restrictions Precautions Precautions: Fall Restrictions Weight Bearing Restrictions: No    Mobility  Bed Mobility Overal bed mobility: Needs Assistance Bed Mobility: Supine to Sit     Supine to sit: Max assist;HOB elevated     General bed mobility comments: patient required Max A for BLE and trunk support to sit upright. verbal cues for sequencing and safety. faciliation also for reaching with LUE due to proximal muscle weakness in left arm. extra time required to complete tasks    Transfers Overall transfer level: Needs assistance Equipment used: Rolling walker (2 wheeled) (bariatric rolling walker used intermittently) Transfers: Sit to/from [employer];Lateral/Scoot Transfers Sit to Stand: Mod assist (+3 person for safety and body habitus)   Squat pivot transfers:  (+3 person for safety and body habitus)    Lateral/Scoot Transfers: Mod assist;+2 physical assistance General transfer comment: verbal, tactile, and visual cues for transfer technique. patient completed 4 sit to stand transfers from bed and was able to clear hips for partial standing x 3 bouts with difficulty with trunk extension due to fatigue. short seated rest breaks between bouts of standing.  patient performed incremental squat pivot transfer from bed to chair with verbal cues for technique.  Ambulation/Gait             General Gait Details: ambulation not attempted this session   Stairs             Wheelchair Mobility    Modified Rankin (Stroke Patients Only)       Balance Overall balance assessment: Needs assistance Sitting-balance support: Feet supported;No upper extremity supported Sitting balance-Leahy Scale: Fair Sitting balance - Comments: improvement in sitting balance noted today.  patient is able to maintain midline without exterior trunk support and no trunk sway/loss of  balance                                    Cognition Arousal/Alertness: Awake/alert Behavior During Therapy: WFL for tasks assessed/performed Overall Cognitive Status: Within Functional Limits for tasks assessed                                 General Comments: talking throughout session. patient able to follow single step commands consistently      Exercises      General Comments        Pertinent Vitals/Pain Pain Assessment: No/denies pain    Home Living                      Prior Function            PT Goals (current goals can now be found in the care plan section) Acute Rehab PT Goals Patient Stated Goal: to get stronger PT Goal Formulation: With patient Time For Goal Achievement: 02/07/21 Potential to Achieve Goals: Good Progress towards PT goals: Progressing toward goals    Frequency    Min 2X/week      PT Plan Current plan remains appropriate    Co-evaluation   Reason for Co-Treatment: To address functional/ADL transfers PT goals addressed during session: Mobility/safety with mobility        AM-PAC PT "6 Clicks" Mobility   Outcome Measure  Help needed turning from your back to your side while in a flat bed without using bedrails?: A Lot Help needed moving from lying on your back to sitting on the side of a flat bed without using bedrails?: A Lot Help needed moving to and from a bed to a chair (including a wheelchair)?: Total Help needed standing up from a chair using your arms (e.g., wheelchair or bedside chair)?: Total Help needed to walk in hospital room?: Total Help needed climbing 3-5 steps with a railing? : Total 6 Click Score: 8    End of Session   Activity Tolerance: Patient tolerated treatment well Patient left: in chair;with call bell/phone within reach;with chair alarm set Nurse Communication: Mobility status PT Visit Diagnosis: Muscle weakness (generalized) (M62.81);Difficulty in walking, not  elsewhere classified (R26.2)     Time: 4193-7902 PT Time Calculation (min) (ACUTE ONLY): 38 min  Charges:  $Therapeutic Activity: 23-37 mins                     Donna Bernard, PT, MPT    [name] 02/01/2021, 11:53 AM

## 2021-02-01 NOTE — Progress Notes (Signed)
PROGRESS NOTE    KACEN MELLINGER  DGL:[id number] DOB: 1991/10/27 DOA: 01/12/2021 PCP: Pcp, No   Brief Narrative:   30 yo morbidly obese male with Asthma now with progressive multiorgan failure with COVID 19 pneumonia and ARDS with severe acidosis and severe hypoxia with ARDS with acute renal failure; now extubated and off renal replacement therapy.  Initially due to severe respiratory failure she was intubated was found to be in acute kidney injury started on CRRT.  She was found to be COVID-19 positive requiring treatment including remdesivir and steroids.  She was not deemed to be candidate for baricitinib/Actemra.  On 2/8C suffered from acute MI/STEMI therefore taken for left heart catheterization 2/9.  Successfully extubated on 2/11.  Patient renal function continues to improve including his breathing.  Slowly advancing his diet and tolerating well.  PT recommended CIR therefore awaiting authorization.  Does have persistent sinus tachycardia therefore metoprolol being adjusted.  Assessment & Plan:   Principal Problem:   Acute hypoxemic respiratory failure due to COVID-19 [facility]) Active Problems:   DKA (diabetic ketoacidosis) (HCC)   Obesity, Class III, BMI 40-49.9 (morbid obesity) (HCC)   Acute metabolic encephalopathy   ARDS (adult respiratory distress syndrome) (HCC)   ST elevation myocardial infarction (STEMI) (HCC)   Acute respiratory failure with hypoxia (HCC)   VT (ventricular tachycardia) (HCC)   Elevated troponin   Acute hypoxemic respiratory failure due to severe COVID-19 pneumonia/ARDS 2/1 Rquire Mechanical ventilation  Successfully extubated 01/22/21 -currently on room air at rest Completed course of remdesivir and antibiotics. Due to suspected CAP no baricitinib/actemra P.o. prednisone completed 2/21. Discontinue isolation today 2/21  AKI with acute renal failure, resolving -Initially required CRRT but now has resolved.  Creatinine today 1.73.  Acute inferior STEMI with  associated diastolic heart failure and Vtach Sinus tachycardia -Cardiology team is following.  Status post left heart cath 2/9-no significant CAD seen.  Suspect myocarditis.  -Echocardiogram 2/9-EF 60 to 65% -LDL 65. A1c is 11.8 -Increase metoprolol 50 mg twice daily  Ambulatory dysfunction, profound  -Secondary to acute illness.  PT/OT-recommending CIR   Profound hyperglycemia secondary to newly diagnosed uncontrolled diabetes - insulin dependent -Hemoglobin A1c-11.8 -Insulin sliding scale and Accu-Chek -Diabetic coordinator following -Increase Levemir 33 units daily -Increase NovoLog 6 units 3 times Premeal  PT-CIR   DVT prophylaxis: Subcu heparin Code Status: Full code Family Communication: None  Status is: Inpatient  Remains inpatient appropriate because:Inpatient level of care appropriate due to severity of illness   Dispo: The patient is from: Home              Anticipated d/c is to: CIR              Anticipated d/c date is: 1 day              Patient currently is medically stable to be transition to CIR when bed is available.   Difficult to place patient No  Body mass index is 48.28 kg/m.       Subjective: Patient is 70 complaints.  Tolerating oral diet without any issues.  Currently awaiting to be transition to CIR  Review of Systems Otherwise negative except as per HPI, including: General: Denies fever, chills, night sweats or unintended weight loss. Resp: Denies cough, wheezing, shortness of breath. Cardiac: Denies chest pain, palpitations, orthopnea, paroxysmal nocturnal dyspnea. GI: Denies abdominal pain, nausea, vomiting, diarrhea or constipation GU: Denies dysuria, frequency, hesitancy or incontinence MS: Denies muscle aches, joint pain or swelling Neuro: Denies  headache, neurologic deficits (focal weakness, numbness, tingling), abnormal gait Psych: Denies anxiety, depression, SI/HI/AVH Skin: Denies new rashes or lesions ID: Denies sick  contacts, exotic exposures, travel  Examination: Constitutional: Not in acute distress, morbid obesity Respiratory: Clear to auscultation bilaterally Cardiovascular: Normal sinus rhythm, no rubs Abdomen: Nontender nondistended good bowel sounds Musculoskeletal: No edema noted Skin: No rashes seen Neurologic: CN 2-12 grossly intact.  And nonfocal Psychiatric: Normal judgment and insight. Alert and oriented x 3. Normal mood. Rectal tube in place Objective: Vitals:   02/01/21 0002 02/01/21 0316 02/01/21 0319 02/01/21 0807  BP: (!) 159/99 (!) 141/91  (!) 155/99  Pulse: (!) 102 (!) 101  (!) 110  Resp: 19 20  17   Temp: 98.4 F (36.9 C) 98.2 F (36.8 C)  98.3 F (36.8 C)  TempSrc:      SpO2: 99% 100%  99%  Weight:   (!) 161.5 kg   Height:        Intake/Output Summary (Last 24 hours) at 02/01/2021 1115 Last data filed at 02/01/2021 0900 Gross per 24 hour  Intake 163 ml  Output 1400 ml  Net -1237 ml   Filed Weights   01/30/21 0500 01/31/21 0500 02/01/21 0319  Weight: (!) 167.4 kg (!) 168.7 kg (!) 161.5 kg     Data Reviewed:   CBC: Recent Labs  Lab 01/27/21 1105 01/28/21 1050 01/29/21 0515 01/30/21 0414 01/31/21 0402  WBC 8.8 9.6 9.0 8.1 9.4  NEUTROABS 6.2 8.0* 5.8 4.9 5.9  HGB 9.1* 9.6* 8.9* 9.2* 8.3*  HCT 30.7* 33.5* 30.9* 30.3* 26.9*  MCV 88.5 88.6 89.3 87.8 85.7  PLT 350 337 310 268 256   Basic Metabolic Panel: Recent Labs  Lab 01/26/21 0651 01/28/21 1050 01/29/21 0515 01/30/21 0414 01/31/21 0402 02/01/21 0429  NA 152* 150* 151* 145  --  139  K 4.4 4.6 4.1 4.2  --  4.1  CL 121* 116* 119* 113*  --  107  CO2 21* 24 24 26   --  24  GLUCOSE 98 260* 135* 144*  --  146*  BUN 53* 39* 33* 29*  --  25*  CREATININE 2.53* 2.35* 2.15* 1.96*  --  1.73*  CALCIUM 8.8* 9.2 9.1 8.7*  --  8.5*  MG 2.2 2.1 1.9 1.6* 2.1 2.0  PHOS 3.9  --   --   --   --   --    GFR: Estimated Creatinine Clearance: 99.1 mL/min (A) (by C-G formula based on SCr of 1.73 mg/dL (H)). Liver  Function Tests: Recent Labs  Lab 01/26/21 0651 01/28/21 1050 01/29/21 0515 01/30/21 0414  AST  --  29 14* 13*  ALT  --  25 19 18   ALKPHOS  --  70 64 58  BILITOT  --  0.4 0.5 0.5  PROT  --  6.9 6.1* 6.3*  ALBUMIN 2.5* 2.6* 2.4* 2.7*   No results for input(s): LIPASE, AMYLASE in the last 168 hours. No results for input(s): AMMONIA in the last 168 hours. Coagulation Profile: No results for input(s): INR, PROTIME in the last 168 hours. Cardiac Enzymes: No results for input(s): CKTOTAL, CKMB, CKMBINDEX, TROPONINI in the last 168 hours. BNP (last 3 results) No results for input(s): PROBNP in the last 8760 hours. HbA1C: No results for input(s): HGBA1C in the last 72 hours. CBG: Recent Labs  Lab 01/31/21 0812 01/31/21 1217 01/31/21 1719 01/31/21 2105 02/01/21 0811  GLUCAP 121* 151* 275* 269* 117*   Lipid Profile: No results for input(s): CHOL, HDL,  LDLCALC, TRIG, CHOLHDL, LDLDIRECT in the last 72 hours. Thyroid Function Tests: No results for input(s): TSH, T4TOTAL, FREET4, T3FREE, THYROIDAB in the last 72 hours. Anemia Panel: Recent Labs    01/30/21 0414 01/31/21 0402  FERRITIN 262 285   Sepsis Labs: No results for input(s): PROCALCITON, LATICACIDVEN in the last 168 hours.  No results found for this or any previous visit (from the past 240 hour(s)).       Radiology Studies: No results found.      Scheduled Meds: . albuterol  1-2 puff Inhalation Q6H  . aspirin  81 mg Oral Daily  . atorvastatin  80 mg Oral Daily  . Chlorhexidine Gluconate Cloth  6 each Topical Daily  . clopidogrel  75 mg Oral Daily  . docusate sodium  100 mg Oral BID  . enoxaparin (LOVENOX) injection  0.5 mg/kg Subcutaneous Q24H  . famotidine  20 mg Oral Daily  . feeding supplement (NEPRO CARB STEADY)  237 mL Oral TID BM  . Gerhardt's butt cream   Topical BID  . insulin aspart  0-20 Units Subcutaneous TID AC & HS  . insulin aspart  4 Units Subcutaneous TID WC  . insulin detemir  30  Units Subcutaneous Daily  . mouth rinse  15 mL Mouth Rinse BID  . metoprolol tartrate  50 mg Oral BID  . multivitamin with minerals  1 tablet Oral Daily  . polyethylene glycol  17 g Oral Daily  . sodium chloride flush  3 mL Intravenous Q12H   Continuous Infusions: . sodium chloride       LOS: 20 days   Time spent= 35 mins    Ankit Joline Maxcy, MD [facility]  If 7PM-7AM, please contact night-coverage  02/01/2021, 11:15 AM

## 2021-02-01 NOTE — Progress Notes (Signed)
Inpatient Rehab Admissions Coordinator:   Spoke to pt's mother over the phone to discuss recommendations for CIR.  She is familiar with the program and very hopeful pt wlil be able to transition there when ready.  We discussed estimated length of stay to be around 3 weeks or so, with goals of supervision to min assist.  She will be home with patient most of the time, and when she needs to leave can have family come stay with him, if he requires 24/7 supervision.  Note that patient did very well with therapy today and able to stand several tries and transfer out of bed.  Will follow for possible admission in the next few days once a bed becomes available.    Estill Dooms, PT, DPT Admissions Coordinator [phone] 02/01/21  1:38 PM

## 2021-02-01 NOTE — Progress Notes (Addendum)
Occupational Therapy Treatment Patient Details Name: Erik [name] MRN: [id number] DOB: 07/05/1991 Today's Date: 02/01/2021    History of present illness Pt presented to ER secondary to progressive weakness, decreased PO intake, nausea/vomiting; admitted for management of acute hypoxic respiratory failure due to COVID-19 viral infection, DKA and metabolic encephalopathy.  Hospital course significant for emergent intubation 2/1-2/12/22; CRRT 2/3-01/20/21 via L temporary IJ dialysis catheter (R femoral removed); code STEMI with emergent cardiac cath (showing no significant coronary disease) 01/20/21 with elevated troponin associated with myocarditis.   OT comments  Pt seen for OT treatment on this date. Upon arrival to room pt awake and seated upright in bed. Pt agreeable to tx and reporting no pain, however reporting some discomfort from FMS. Pt is making great progress towards goals, and was able to progress to participating in supine>sit transfer with MAX A, sit<>stand transfers with MOD Ax3 (+3 for safety and body habitus), incremental squat pivot transfer from bed to chair with MOD Ax3, and seated grooming with SET-UP. Pt continues to benefit from skilled OT services to maximize return to PLOF and minimize risk of future falls, injury, caregiver burden, and readmission. Will continue to follow POC. Discharge recommendation remains appropriate.    Follow Up Recommendations  CIR    Equipment Recommendations  Other (comment) (defer to next venue of care)       Precautions / Restrictions Precautions Precautions: Fall Restrictions Weight Bearing Restrictions: No       Mobility Bed Mobility Overal bed mobility: Needs Assistance Bed Mobility: Supine to Sit     Supine to sit: Max assist;HOB elevated     General bed mobility comments: Patient required MAX A and verbal cues for  b/l LE and trunk support to sit upright. Assistance provided for reaching with LUE due to proximal muscle weakness in  left arm. Required extra time to complete tasks  Transfers Overall transfer level: Needs assistance Equipment used: Rolling walker (2 wheeled) (bariatric rolling walker used intermittently) Transfers: Sit to/from [employer];Lateral/Scoot Transfers Sit to Stand: Mod assist (+3 person for safety and body habitus)   Squat pivot transfers: Max assist (+3 person for safety and body habitus)    Lateral/Scoot Transfers: Mod assist;+2 physical assistance General transfer comment: Multi-modal cues for body positioning during transfers. Pt able to complete x4 sit to stand transfers from bed and was able to clear hips for partial standing x3 bouts with difficulty with trunk extension due to fatigue. Pt able to maintain seated balance independently during seated rest breaks between bouts of standing. Pt also performed incremental squat pivot transfer from bed to chair with verbal cues for technique.  Upon arrival to room, pt sitting upright in bed HR 124. During sit<>stand transfer, HR increased to MAX 144. At end of session, pt seated upright in recliner with legs elevated, HR 126, and pt in no acute distress      Balance Overall balance assessment: Needs assistance Sitting-balance support: Feet supported;No upper extremity supported Sitting balance-Leahy Scale: Fair Sitting balance - Comments: Pt with improved sitting balance, maintaining midline without trunk support for a culmulative of 20 mins between sit>stand transfers     Standing balance-Leahy Scale: Zero Standing balance comment: Pt able to clear hips for partial standing x3 bouts with difficulty with trunk extension due to fatigue.                           ADL either performed or assessed with clinical judgement  ADL Overall ADL's : Needs assistance/impaired     Grooming: Wash/dry hands;Oral care;Set up;Sitting Grooming Details (indicate cue type and reason): Continues to use L UE as functional assist,  however appears to have increased strength and ROM grossly                             Functional mobility during ADLs: Maximal assistance;+2 for physical assistance;Rolling walker (+3 for physical assistance during sit>stand; able to tolerate x4 attempts)                 Cognition Arousal/Alertness: Awake/alert Behavior During Therapy: WFL for tasks assessed/performed Overall Cognitive Status: Within Functional Limits for tasks assessed                                 General Comments: Pt agreeable and motivated throughout session. Pt was able to follow commands consistenly and demonstrate good recall of cues provided                   Pertinent Vitals/ Pain       Pain Assessment: No/denies pain         Frequency  Min 2X/week        Progress Toward Goals  OT Goals(current goals can now be found in the care plan section)  Progress towards OT goals: Progressing toward goals  Acute Rehab OT Goals Patient Stated Goal: to get stronger OT Goal Formulation: With patient Time For Goal Achievement: 02/08/21 Potential to Achieve Goals: Good  Plan Discharge plan remains appropriate;Frequency remains appropriate    Co-evaluation    PT/OT/SLP Co-Evaluation/Treatment: Yes Reason for Co-Treatment: To address functional/ADL transfers PT goals addressed during session: Mobility/safety with mobility OT goals addressed during session: ADL's and self-care      AM-PAC OT "6 Clicks" Daily Activity     Outcome Measure   Help from another person eating meals?: A Little Help from another person taking care of personal grooming?: A Little Help from another person toileting, which includes using toliet, bedpan, or urinal?: Total Help from another person bathing (including washing, rinsing, drying)?: A Lot Help from another person to put on and taking off regular upper body clothing?: A Little Help from another person to put on and taking off regular  lower body clothing?: A Lot 6 Click Score: 14    End of Session Equipment Utilized During Treatment: Rolling walker  OT Visit Diagnosis: Unsteadiness on feet (R26.81);Muscle weakness (generalized) (M62.81)   Activity Tolerance Patient tolerated treatment well   Patient Left in chair;with call bell/phone within reach;with chair alarm set   Nurse Communication Mobility status        Time: 2536-6440 OT Time Calculation (min): 37 min  Charges: OT General Charges $OT Visit: 1 Visit OT Treatments $Self Care/Home Management : 8-22 mins  Matthew Folks, OTR/L [facility] [phone]

## 2021-02-01 NOTE — Plan of Care (Addendum)
  RD consulted for nutrition education regarding diabetes.   Lab Results  Component Value Date   HGBA1C 11.8 (H) 01/27/2021    RD provided "Carbohydrate Counting for People with Diabetes" handout from the Academy of [employer] and Dietetics. Discussed different food groups and their effects on blood sugar, emphasizing carbohydrate-containing foods. Provided list of carbohydrates and recommended serving sizes of common foods.  Discussed importance of controlled and consistent carbohydrate intake throughout the day. Provided examples of ways to balance meals/snacks and encouraged intake of high-fiber, whole grain complex carbohydrates. Teach back method used.  Expect good compliance.  Body mass index is 48.28 kg/m. Pt meets criteria for obesity class III based on current BMI.  Current diet order is dysphagia 3, patient is consuming approximately 100% of meals at this time.  Felix Pacini, MS, RD, LDN Pager number available on Amion

## 2021-02-01 NOTE — Progress Notes (Signed)
[facility]  ROUNDING NOTE   Subjective:   Patient sitting up in bed Alert and oriented Able to eat meals without nausea Denies shortness of breath and chest pain Says he hasn't worked with therapy all weekend but  Hopes they come today  Objective:  Vital signs in last 24 hours:  Temp:  [97.8 F (36.6 C)-98.4 F (36.9 C)] 98.3 F (36.8 C) (02/21 0807) Pulse Rate:  [100-119] 110 (02/21 0807) Resp:  [16-20] 17 (02/21 0807) BP: (127-159)/(76-99) 155/99 (02/21 0807) SpO2:  [97 %-100 %] 99 % (02/21 0807) Weight:  [161.5 kg] 161.5 kg (02/21 0319)  Weight change: -7.258 kg Filed Weights   01/30/21 0500 01/31/21 0500 02/01/21 0319  Weight: (!) 167.4 kg (!) 168.7 kg (!) 161.5 kg    Intake/Output: I/O last 3 completed shifts: In: 526 [P.O.:336; Other:90; IV Piggyback:100] Out: 1000 [Urine:600; Stool:400]   Intake/Output this shift:  Total I/O In: -  Out: 600 [Urine:600]  Physical Exam: General: NAD, resting in bed  Head: Normocephalic, oral mucosa dry  Eyes: Anicteric  Neck: supple  Lungs:  Diminished in bases  Heart: Regular rate and rhythm  Abdomen:  Soft, obese  Extremities:  no peripheral edema.  Neurologic: Alert, oriented, able to answer questions  Skin: No lesions  Access: none    Basic Metabolic Panel: Recent Labs  Lab 01/26/21 0651 01/28/21 1050 01/29/21 0515 01/30/21 0414 01/31/21 0402 02/01/21 0429  NA 152* 150* 151* 145  --  139  K 4.4 4.6 4.1 4.2  --  4.1  CL 121* 116* 119* 113*  --  107  CO2 21* 24 24 26   --  24  GLUCOSE 98 260* 135* 144*  --  146*  BUN 53* 39* 33* 29*  --  25*  CREATININE 2.53* 2.35* 2.15* 1.96*  --  1.73*  CALCIUM 8.8* 9.2 9.1 8.7*  --  8.5*  MG 2.2 2.1 1.9 1.6* 2.1 2.0  PHOS 3.9  --   --   --   --   --     Liver Function Tests: Recent Labs  Lab 01/26/21 0651 01/28/21 1050 01/29/21 0515 01/30/21 0414  AST  --  29 14* 13*  ALT  --  25 19 18   ALKPHOS  --  70 64 58  BILITOT  --  0.4 0.5 0.5  PROT   --  6.9 6.1* 6.3*  ALBUMIN 2.5* 2.6* 2.4* 2.7*   No results for input(s): LIPASE, AMYLASE in the last 168 hours. No results for input(s): AMMONIA in the last 168 hours.  CBC: Recent Labs  Lab 01/27/21 1105 01/28/21 1050 01/29/21 0515 01/30/21 0414 01/31/21 0402  WBC 8.8 9.6 9.0 8.1 9.4  NEUTROABS 6.2 8.0* 5.8 4.9 5.9  HGB 9.1* 9.6* 8.9* 9.2* 8.3*  HCT 30.7* 33.5* 30.9* 30.3* 26.9*  MCV 88.5 88.6 89.3 87.8 85.7  PLT 350 337 310 268 256    Cardiac Enzymes: No results for input(s): CKTOTAL, CKMB, CKMBINDEX, TROPONINI in the last 168 hours.  BNP: Invalid input(s): POCBNP  CBG: Recent Labs  Lab 01/31/21 0812 01/31/21 1217 01/31/21 1719 01/31/21 2105 02/01/21 0811  GLUCAP 121* 151* 275* 269* 117*    Microbiology: Results for orders placed or performed during the hospital encounter of 01/12/21  Blood culture (routine single)     Status: None   Collection Time: 01/12/21  9:05 AM   Specimen: BLOOD  Result Value Ref Range Status   Specimen Description BLOOD LEFT [facility]  Final   Special  Requests   Final    BOTTLES DRAWN AEROBIC ONLY Blood Culture results may not be optimal due to an inadequate volume of blood received in culture bottles   Culture   Final    NO GROWTH 5 DAYS Performed at [facility], [address] [place], [place] [address]    Report Status 01/17/2021 FINAL  Final  SARS Coronavirus 2 by RT PCR (hospital order, performed in [facility] hospital lab) Nasopharyngeal Nasopharyngeal Swab     Status: Abnormal   Collection Time: 01/12/21  9:36 AM   Specimen: Nasopharyngeal Swab  Result Value Ref Range Status   SARS Coronavirus 2 POSITIVE (A) NEGATIVE Final    Comment: RESULT CALLED TO, READ BACK BY AND VERIFIED WITH: ANNA JASPER ON 01/12/21 AT 1111 SDR (NOTE) SARS-CoV-2 target nucleic acids are DETECTED  SARS-CoV-2 RNA is generally detectable in upper respiratory specimens  during the acute phase of infection.  Positive results are indicative  of  the presence of the identified virus, but do not rule out bacterial infection or co-infection with other pathogens not detected by the test.  Clinical correlation with patient history and  other diagnostic information is necessary to determine patient infection status.  The expected result is negative.  Fact Sheet for Patients:   BoilerBrush.com.cy   Fact Sheet for Healthcare Providers:   https://pope.com/    This test is not yet approved or cleared by the [place] FDA and  has been authorized for detection and/or diagnosis of SARS-CoV-2 by FDA under an Emergency Use Authorization (EUA).  This EUA will remain in effect (meaning this tes t can be used) for the duration of  the COVID-19 declaration under Section 564(b)(1) of the Act, 21 U.S.C. section 360-bbb-3(b)(1), unless the authorization is terminated or revoked sooner.  Performed at [facility], [address] [place], [place] [address]   Culture, blood (single)     Status: None   Collection Time: 01/12/21  1:08 PM   Specimen: BLOOD  Result Value Ref Range Status   Specimen Description BLOOD BLOOD RIGHT HAND  Final   Special Requests   Final    BOTTLES DRAWN AEROBIC AND ANAEROBIC Blood Culture adequate volume   Culture   Final    NO GROWTH 5 DAYS Performed at [facility], [address] [place], [place] [address]    Report Status 01/17/2021 FINAL  Final  MRSA PCR Screening     Status: None   Collection Time: 01/12/21  7:22 PM   Specimen: Nasopharyngeal  Result Value Ref Range Status   MRSA by PCR NEGATIVE NEGATIVE Final    Comment:        The GeneXpert MRSA Assay (FDA approved for NASAL specimens only), is one component of a comprehensive MRSA colonization surveillance program. It is not intended to diagnose MRSA infection nor to guide or monitor treatment for MRSA infections. Performed at [facility], [address]  [place], [place] [address]   Culture, respiratory (non-expectorated)     Status: None   Collection Time: 01/13/21  3:04 PM   Specimen: Tracheal Aspirate; Respiratory  Result Value Ref Range Status   Specimen Description   Final    TRACHEAL ASPIRATE Performed at [facility], [address], [place], [place] [address]    Special Requests   Final    NONE Performed at [facility], [address] [place], [place] [address]    Gram Stain   Final    ABUNDANT  WBC PRESENT, PREDOMINANTLY PMN MODERATE GRAM POSITIVE COCCI Performed at [facility], [address] [place], [place] [address]    Culture FEW STAPHYLOCOCCUS AUREUS  Final   Report Status 01/16/2021 FINAL  Final   Organism ID, Bacteria STAPHYLOCOCCUS AUREUS  Final      Susceptibility   Staphylococcus aureus - MIC*    CIPROFLOXACIN <=0.5 SENSITIVE Sensitive     ERYTHROMYCIN <=0.25 SENSITIVE Sensitive     GENTAMICIN <=0.5 SENSITIVE Sensitive     OXACILLIN 0.5 SENSITIVE Sensitive     TETRACYCLINE <=1 SENSITIVE Sensitive     VANCOMYCIN 1 SENSITIVE Sensitive     TRIMETH/SULFA <=10 SENSITIVE Sensitive     CLINDAMYCIN <=0.25 SENSITIVE Sensitive     RIFAMPIN <=0.5 SENSITIVE Sensitive     Inducible Clindamycin NEGATIVE Sensitive     * FEW STAPHYLOCOCCUS AUREUS  Culture, respiratory (non-expectorated)     Status: None   Collection Time: 01/19/21 10:41 PM   Specimen: Tracheal Aspirate; Respiratory  Result Value Ref Range Status   Specimen Description   Final    TRACHEAL ASPIRATE Performed at [facility], [address] [place], [place] [address]    Special Requests   Final    NONE Performed at [facility], [address] [place], [place] [address]    Gram Stain   Final    FEW WBC PRESENT,BOTH PMN AND MONONUCLEAR RARE BUDDING YEAST SEEN Performed at [facility], [address] [place], [place] [address]    Culture FEW CANDIDA ALBICANS  Final   Report Status 01/22/2021  FINAL  Final    Coagulation Studies: No results for input(s): LABPROT, INR in the last 72 hours.  Urinalysis: No results for input(s): COLORURINE, LABSPEC, PHURINE, GLUCOSEU, HGBUR, BILIRUBINUR, KETONESUR, PROTEINUR, UROBILINOGEN, NITRITE, LEUKOCYTESUR in the last 72 hours.  Invalid input(s): APPERANCEUR    Imaging: No results found.   Medications:   . sodium chloride     . albuterol  1-2 puff Inhalation Q6H  . aspirin  81 mg Oral Daily  . atorvastatin  80 mg Oral Daily  . Chlorhexidine Gluconate Cloth  6 each Topical Daily  . clopidogrel  75 mg Oral Daily  . docusate sodium  100 mg Oral BID  . enoxaparin (LOVENOX) injection  0.5 mg/kg Subcutaneous Q24H  . famotidine  20 mg Oral Daily  . feeding supplement (NEPRO CARB STEADY)  237 mL Oral TID BM  . Gerhardt's butt cream   Topical BID  . insulin aspart  0-20 Units Subcutaneous TID AC & HS  . insulin aspart  4 Units Subcutaneous TID WC  . insulin detemir  30 Units Subcutaneous Daily  . mouth rinse  15 mL Mouth Rinse BID  . metoprolol tartrate  25 mg Oral Once  . metoprolol tartrate  50 mg Oral BID  . multivitamin with minerals  1 tablet Oral Daily  . polyethylene glycol  17 g Oral Daily  . sodium chloride flush  3 mL Intravenous Q12H   sodium chloride, acetaminophen (TYLENOL) oral liquid 160 mg/5 mL, dextromethorphan-guaiFENesin, dextrose, hydrALAZINE, lip balm, metoprolol tartrate, ondansetron **OR** ondansetron (ZOFRAN) IV, ondansetron (ZOFRAN) IV, polyethylene glycol, sodium chloride flush  Assessment/ Plan:  Erik [name] is a 30 y.o. black male with asthma, with no other past medical history, and no outpatient medications who was [facility] 01/12/2021 for ARDS (adult respiratory distress syndrome) (HCC) [J80] Acute respiratory failure with hypoxia (HCC) [J96.01] Acute hypoxemic respiratory failure due to COVID-19 (  HCC) [U07.1, J96.01] Hyperosmolar hyperglycemic state (HHS) (HCC) [E11.00, E11.65] Pneumonia  due to COVID-19 virus [U07.1, J12.82]   Started on CRRT on admission. Discontinued on 2/9. Hemodynamically stable. Has not required intermittent hemodialysis this admission.   1. Acute kidney injury: with no known creatinine baseline.  Continued improvement- Crt 1.73 Improved renal function Able to urinate Seems to be nearing baseline renal function Discussed with patient the importance of diet and exercise  2. Acute respiratory failure:  Currently on room air  3. 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 Hemoglobin A1c of 13.6%.  Maintains glucose control on current treament  4. Anemia with kidney failure:  Continued improvement -Hgb-9.2  5. Hypernatremia:  - improved NA-139 -Encouraged oral nutrition   LOS: 20 Erik [name] 2/21/202210:48 AM

## 2021-02-01 NOTE — TOC Progression Note (Signed)
Transition of Care [facility]) - Progression Note    Patient Details  Name: Erik [name] MRN: [id number] Date of Birth: [birth year]  [facility] CM/SW Contact  Liliana Cline, LCSW Phone Number: 02/01/2021, 1:54 PM  Clinical Narrative:   Plan for CIR when bed available. CIR Representative Estill Dooms said Thursday at the latest.    Expected Discharge Plan: IP Rehab Facility Barriers to Discharge: Continued Medical Work up  Expected Discharge Plan and Services Expected Discharge Plan: IP Rehab Facility       Living arrangements for the past 2 months: Single Family Home Expected Discharge Date: 02/01/21                                     Social Determinants of Health (SDOH) Interventions    Readmission Risk Interventions No flowsheet data found.

## 2021-02-01 NOTE — Progress Notes (Signed)
Nutrition Follow-up  DOCUMENTATION CODES:   Morbid obesity  INTERVENTION:  Will discontinue Nepro per patient request.  Provide Ensure Max Protein po BID, each supplement provides 150 kcal and 30 grams of protein.  Provide double protein portions at meals.  Continue MVI.  Provided diet education (note to follow).  NUTRITION DIAGNOSIS:   Increased nutrient needs related to catabolic illness (WGYKZ-99) as evidenced by estimated needs.  New nutrition diagnosis.  GOAL:   Patient will meet greater than or equal to 90% of their needs  Progressing.  MONITOR:   PO intake,Supplement acceptance,Labs,Weight trends,I & O's  REASON FOR ASSESSMENT:   Ventilator,Consult Assessment of nutrition requirement/status,Enteral/tube feeding initiation and management  ASSESSMENT:   30 year old male with PMHx of asthma admitted with COVID-19 PNA and AKI.   2/1 intubated 2/7 emesis x 3; tube feeds held 2/8 tube feeds resumed 2/12 extubated 2/14 diet advanced to dysphagia 1 with nectar thick liquids per SLP 2/16 diet advanced to dysphagia 3 with thin liquids per SLP  Patient no longer receiving dialysis. Per chart plan is for patient to transfer to CIR when bed available. RD had already planned to see patient for follow-up today but then also received consult for diabetes education.  Met with patient and his mother at bedside. Patient reports his nausea has improved and his appetite is much better. He is eating 100% of his meals now. Discussed increased nutrient needs. Patient reports he does not like the Nepro shakes and he has not been drinking them. He is amenable to trying Ensure Max. RD also encouraged patient order adequate calories and protein at meals and discussed options on the menu. Patient unable to provide specific information on typical intake PTA. Provided diet education per consult.  Medications reviewed and include: Colace 100 mg BID, famotidine, Novolog 0-20 units TID and  QHS, Novolog 6 units TID, Levemir 33 units daily, MVI daily, Miralax.  Labs reviewed: CBG 117-275, BUN 25, Creatinine 1.73.  I/O: 600 mL UOP yesterday (0.2 mL/kg/hr) + 3 occurrences unmeasured UOP  Weight trend: 161.5 kg on 2/21; overall -3.4 kg from 2/2  Diet Order:   Diet Order            DIET DYS 3 Room service appropriate? Yes with Assist; Fluid consistency: Thin  Diet effective now                EDUCATION NEEDS:   Education needs have been addressed  Skin:  Skin Assessment: Skin Integrity Issues: (MASD buttocks)  Last BM:  01/31/2021 - type 7 per rectal tube  Height:   Ht Readings from Last 1 Encounters:  01/12/21 6' (1.829 m)   Weight:   Wt Readings from Last 1 Encounters:  02/01/21 (!) 161.5 kg   Ideal Body Weight:  80.9 kg  BMI:  Body mass index is 48.28 kg/m.  Estimated Nutritional Needs:   Kcal:  2900-3100  Protein:  >150g/day  Fluid:  2.5-2.8L/day  Jacklynn Barnacle, MS, RD, LDN Pager number available on Amion

## 2021-02-02 LAB — BASIC METABOLIC PANEL
Anion gap: 8 (ref 5–15)
BUN: 26 mg/dL — ABNORMAL HIGH (ref 6–20)
CO2: 25 mmol/L (ref 22–32)
Calcium: 8.8 mg/dL — ABNORMAL LOW (ref 8.9–10.3)
Chloride: 107 mmol/L (ref 98–111)
Creatinine, Ser: 1.63 mg/dL — ABNORMAL HIGH (ref 0.61–1.24)
GFR, Estimated: 58 mL/min — ABNORMAL LOW (ref >60)
Glucose, Bld: 124 mg/dL — ABNORMAL HIGH (ref 70–99)
Potassium: 4 mmol/L (ref 3.5–5.1)
Sodium: 140 mmol/L (ref 135–145)

## 2021-02-02 LAB — GLUCOSE, CAPILLARY
Glucose-Capillary: 111 mg/dL — ABNORMAL HIGH (ref 70–99)
Glucose-Capillary: 178 mg/dL — ABNORMAL HIGH (ref 70–99)
Glucose-Capillary: 151 mg/dL — ABNORMAL HIGH (ref 70–99)
Glucose-Capillary: 106 mg/dL — ABNORMAL HIGH (ref 70–99)

## 2021-02-02 LAB — MAGNESIUM: Magnesium: 1.9 mg/dL (ref 1.7–2.4)

## 2021-02-02 MED ORDER — DOCUSATE SODIUM 100 MG PO CAPS: 100.0000 mg | ORAL_CAPSULE | Freq: Two times a day (BID) | ORAL | Status: DC | PRN

## 2021-02-02 MED ORDER — INSULIN DETEMIR 100 UNIT/ML ~~LOC~~ SOLN
37.0000 [IU] | Freq: Every day | SUBCUTANEOUS | Status: DC
  Administered 2021-02-02 – 2021-02-03 (×2): 37 [IU] via SUBCUTANEOUS
  Filled 2021-02-02 (×2): qty 0.37

## 2021-02-02 MED ORDER — OXYCODONE HCL 5 MG PO TABS
5.0000 mg | ORAL_TABLET | ORAL | Status: DC | PRN
  Administered 2021-02-02: 5 mg via ORAL
  Filled 2021-02-02: qty 1

## 2021-02-02 MED ORDER — INSULIN ASPART 100 UNIT/ML ~~LOC~~ SOLN
8.0000 [IU] | Freq: Three times a day (TID) | SUBCUTANEOUS | Status: DC
  Administered 2021-02-02 – 2021-02-03 (×4): 8 [IU] via SUBCUTANEOUS
  Filled 2021-02-02 (×3): qty 1

## 2021-02-02 MED FILL — Heparin Sod (Porcine)-NaCl IV Soln 2000 Unit/L-0.9%: INTRAVENOUS | Qty: 1000 | Status: AC

## 2021-02-02 NOTE — Plan of Care (Signed)
  Problem: Clinical Measurements: Goal: Will remain free from infection Outcome: Progressing Goal: Respiratory complications will improve Outcome: Progressing Goal: Cardiovascular complication will be avoided Outcome: Progressing   Problem: Activity: Goal: Risk for activity intolerance will decrease Outcome: Progressing   Problem: Nutrition: Goal: Adequate nutrition will be maintained Outcome: Progressing   Problem: Coping: Goal: Level of anxiety will decrease Outcome: Progressing   Problem: Elimination: Goal: Will not experience complications related to urinary retention Outcome: Progressing   Problem: Skin Integrity: Goal: Risk for impaired skin integrity will decrease Outcome: Progressing

## 2021-02-02 NOTE — Plan of Care (Signed)
  Problem: Clinical Measurements: Goal: Respiratory complications will improve Outcome: Progressing Goal: Cardiovascular complication will be avoided Outcome: Progressing   Problem: Nutrition: Goal: Adequate nutrition will be maintained Outcome: Progressing   Problem: Coping: Goal: Level of anxiety will decrease Outcome: Progressing   Problem: Skin Integrity: Goal: Risk for impaired skin integrity will decrease Outcome: Progressing   Problem: Activity: Goal: Risk for activity intolerance will decrease Outcome: Not Progressing

## 2021-02-02 NOTE — Progress Notes (Addendum)
Physical Therapy Treatment Patient Details Name: Erik [name] MRN: [id number] DOB: June 18, 1991 Today's Date: 02/02/2021    History of Present Illness Pt presented to ER secondary to progressive weakness, decreased PO intake, nausea/vomiting; admitted for management of acute hypoxic respiratory failure due to COVID-19 viral infection, DKA and metabolic encephalopathy.  Hospital course significant for emergent intubation 2/1-2/12/22; CRRT 2/3-01/20/21 via L temporary IJ dialysis catheter (R femoral removed); code STEMI with emergent cardiac cath (showing no significant coronary disease) 01/20/21 with elevated troponin associated with myocarditis.    PT Comments    Patient continues to make progress with increased activity tolerance and increased independence with mobility.  Patient is very motivated to regain independence. Multiple sit to stand transfers performed today with rolling walker with Mod A- Max A +2 person assistance. Assistance required for lifting to stand and facilitation for trunk extension to achieve upright standing position. Patient was able to stand for ~ 15 seconds this session which is an improvement from previous session. Recommend to continue PT to maximize independence and facilitate return to prior level of function. Continue to recommend CIR for discharge.    Follow Up Recommendations  CIR     Equipment Recommendations   (to be determined at next leve of care)    Recommendations for Other Services       Precautions / Restrictions Precautions Precautions: Fall Restrictions Weight Bearing Restrictions: No    Mobility  Bed Mobility Overal bed mobility: Needs Assistance Bed Mobility: Supine to Sit;Sit to Supine Rolling: Min assist (for rolling to left side for comfort due to rectal tube)   Supine to sit: Max assist;HOB elevated Sit to supine: Mod assist;+2 for physical assistance   General bed mobility comments: one person assistance for supine to short sitting.  +2 person assistance required for return to bed. verbal cues for sequencing and technique.    Transfers Overall transfer level: Needs assistance Equipment used: Rolling walker (2 wheeled) (bariatirc rolling walker) Transfers: Sit to/from Stand           General transfer comment: patient performed 4 bouts of sit to stand transfers from bed with the bed in the lowest height. patient needs Mod- Max A +2 with lifting assistance required and for faciliation for upright trunk. patient was able to stand fully for more than 10 seconds with rolling walker for UE support. short rest breaks between bouts of standing  Ambulation/Gait                 Stairs             Wheelchair Mobility    Modified Rankin (Stroke Patients Only)       Balance   Sitting-balance support: Feet supported;No upper extremity supported Sitting balance-Leahy Scale: Fair Sitting balance - Comments: no loss of balance in sitting position                                    Cognition Arousal/Alertness: Awake/alert Behavior During Therapy: WFL for tasks assessed/performed Overall Cognitive Status: Within Functional Limits for tasks assessed                                 General Comments: patient following single step commands without difficulty      Exercises      General Comments        Pertinent Vitals/Pain  Pain Assessment: No/denies pain Faces Pain Scale: Hurts a little bit Pain Location: rectal tube Pain Descriptors / Indicators: Discomfort Pain Intervention(s): Monitored during session    Home Living                      Prior Function            PT Goals (current goals can now be found in the care plan section) Acute Rehab PT Goals Patient Stated Goal: to get stronger PT Goal Formulation: With patient Time For Goal Achievement: 02/07/21 Potential to Achieve Goals: Good Progress towards PT goals: Progressing toward goals     Frequency    Min 2X/week      PT Plan Current plan remains appropriate    Co-evaluation              AM-PAC PT "6 Clicks" Mobility   Outcome Measure  Help needed turning from your back to your side while in a flat bed without using bedrails?: A Little Help needed moving from lying on your back to sitting on the side of a flat bed without using bedrails?: A Lot Help needed moving to and from a bed to a chair (including a wheelchair)?: Total Help needed standing up from a chair using your arms (e.g., wheelchair or bedside chair)?: Total Help needed to walk in hospital room?: Total Help needed climbing 3-5 steps with a railing? : Total 6 Click Score: 9    End of Session   Activity Tolerance: Patient tolerated treatment well Patient left: in bed;with call bell/phone within reach;with bed alarm set Nurse Communication:  (nurse aide in room during part of session to assist with chaning soiled pads while standing) PT Visit Diagnosis: Muscle weakness (generalized) (M62.81);Difficulty in walking, not elsewhere classified (R26.2)     Time: 1856-3149 PT Time Calculation (min) (ACUTE ONLY): 34 min  Charges:  $Therapeutic Activity: 23-37 mins                     Donna Bernard, PT, MPT    [name] 02/02/2021, 4:36 PM

## 2021-02-02 NOTE — Progress Notes (Signed)
   02/02/21 1600  Assess: MEWS Score  Temp 98.3 F (36.8 C)  BP (!) 144/97  Pulse Rate (!) 118  Resp 18  Level of Consciousness Alert  SpO2 98 %  O2 Device Room Air  Patient Activity (if Appropriate) In bed  Assess: MEWS Score  MEWS Temp 0  MEWS Systolic 0  MEWS Pulse 2  MEWS RR 0  MEWS LOC 0  MEWS Score 2  MEWS Score Color Yellow  Assess: if the MEWS score is Yellow or Red  Were vital signs taken at a resting state? Yes  Focused Assessment Change from prior assessment (see assessment flowsheet)  Early Detection of Sepsis Score *See Row Information* Low  MEWS guidelines implemented *See Row Information* Yes  Treat  Pain Scale 0-10  Pain Score 0  Take Vital Signs  Increase Vital Sign Frequency  Yellow: Q 2hr X 2 then Q 4hr X 2, if remains yellow, continue Q 4hrs  Escalate  MEWS: Escalate Yellow: discuss with charge nurse/RN and consider discussing with provider and RRT  Notify: Charge Nurse/RN  Name of Charge Nurse/RN Notified Grier Mitts, RN  Date Charge Nurse/RN Notified 02/02/21  Time Charge Nurse/RN Notified 1612  Notify: Provider  Provider Name/Title Dr. Nelson Chimes  Date Provider Notified 02/02/21  Time Provider Notified 1612  Notification Type Page  Notification Reason Change in status  Provider response No new orders;Other (Comment) (cont to monitor)  Date of Provider Response 02/02/21  Time of Provider Response [phone]  Document  Patient Outcome Other (Comment) (continue to monitor)  Progress note created (see row info) Yes

## 2021-02-02 NOTE — Consult Note (Signed)
Physical Medicine and Rehabilitation Consult Reason for Consult: Cardiopulmonary debility Referring Physician: Dimple Nanas, MD   HPI: Erik [name] is a 30 y.o. male with asthma who is admitted with multiorgan failure secondary to COVID-19 and ARDS. His course ws complicated by severe acidosis and sever hypoxia.  Physical Medicine & Rehabilitation was consulted to assess candidacy for CIR given debility. Currently extubated and now off renal replacement therapy. Rectal tube in place and this is currently what is most bothersome to him,    Review of Systems  Constitutional: Positive for malaise/fatigue.  HENT: Negative.   Eyes: Negative.   Cardiovascular: Negative.   Gastrointestinal: Negative.   Genitourinary: Negative.   Musculoskeletal: Negative.   Skin: Negative.   Neurological: Positive for weakness.  Endo/Heme/Allergies: Negative.   Psychiatric/Behavioral: Negative.    Past Medical History:  Diagnosis Date  . Asthma    Past Surgical History:  Procedure Laterality Date  . LEFT HEART CATH AND CORONARY ANGIOGRAPHY N/A 01/20/2021   Procedure: LEFT HEART CATH AND CORONARY ANGIOGRAPHY;  Surgeon: Yvonne Kendall, MD;  Location: [facility] INVASIVE CV LAB;  Service: Cardiovascular;  Laterality: N/A;   No family history on file. Social History:  reports that he has never smoked. He has never used smokeless tobacco. He reports that he does not drink alcohol and does not use drugs. Allergies: No Known Allergies No medications prior to admission.    Home: Home Living Family/patient expects to be discharged to:: Private residence Living Arrangements: Parent Available Help at Discharge: Family Type of Home: House Home Layout: Multi-level,Bed/bath upstairs Home Equipment: None  Functional History: Prior Function Level of Independence: Independent Comments: Indep with ADLs, household and community mobilization without assist device; denies fall history; no home O2; enjoys  video games. Functional Status:  Mobility: Bed Mobility Overal bed mobility: Needs Assistance Bed Mobility: Supine to Sit Rolling: Mod assist,Max assist,+2 for physical assistance Supine to sit: Max assist,HOB elevated Sit to supine: Max assist,+2 for physical assistance General bed mobility comments: Patient required MAX A and verbal cues for  b/l LE and trunk support to sit upright. Assistance provided for reaching with LUE due to proximal muscle weakness in left arm. Required extra time to complete tasks Transfers Overall transfer level: Needs assistance Equipment used: Rolling walker (2 wheeled) (bariatric rolling walker used intermittently) Transfer via Lift Equipment:  ([employer]/process development scientist) Transfers: Sit to/from [employer] Transfers,Lateral/Scoot Transfers Sit to Stand: Mod assist (+3 person for safety and body habitus) Squat pivot transfers: Max assist (+3 person for safety and body habitus)  Lateral/Scoot Transfers: Mod assist,+2 physical assistance General transfer comment: Multi-modal cues for body positioning during transfers. Pt able to complete x4 sit to stand transfers from bed and was able to clear hips for partial standing x3 bouts with difficulty with trunk extension due to fatigue. Pt able to maintain seated balance independently during seated rest breaks between bouts of standing. Pt also performed incremental squat pivot transfer from bed to chair with verbal cues for technique. Ambulation/Gait General Gait Details: ambulation not attempted this session    ADL: ADL Overall ADL's : Needs assistance/impaired Grooming: Wash/dry hands,Oral care,Set up,Sitting Grooming Details (indicate cue type and reason): Continues to use L UE as functional assist, however appears to have increased strength and ROM grossly Upper Body Bathing: Minimal assistance,Set up,Bed level Lower Body Bathing: Maximal assistance,Total assistance,Bed level Lower Body Bathing Details (indicate cue  type and reason): using lateral rolling technique, one person assist for bathing task, but  2p assist to roll for task Upper Body Dressing : Minimal assistance,Bed level Upper Body Dressing Details (indicate cue type and reason): garment oriented for patient Lower Body Dressing: Maximal assistance,Total assistance,Bed level Lower Body Dressing Details (indicate cue type and reason): To don/doff socks Toileting- Clothing Manipulation and Hygiene: Maximal assistance,+2 for physical assistance,Bed level Toileting - Clothing Manipulation Details (indicate cue type and reason): pt with large liquid BM in bed despite FMS connected. Functional mobility during ADLs: Maximal assistance,+2 for physical assistance,Rolling walker (+3 for physical assistance during sit>stand; able to tolerate x4 attempts)  Cognition: Cognition Overall Cognitive Status: Within Functional Limits for tasks assessed Orientation Level: Oriented X4 Cognition Arousal/Alertness: Awake/alert Behavior During Therapy: WFL for tasks assessed/performed Overall Cognitive Status: Within Functional Limits for tasks assessed General Comments: Pt agreeable and motivated throughout session. Pt was able to follow commands consistenly and demonstrate good recall of cues provided  Blood pressure (!) 148/95, pulse (!) 117, temperature 98.2 F (36.8 C), resp. rate 16, height 6' (1.829 m), weight (!) 166 kg, SpO2 100 %. Physical Exam  Gen: no distress, normal appearing. Morbid obesity HEENT: oral mucosa pink and moist, NCAT Cardio: Tachycardic Chest: normal effort, normal rate of breathing Abd: soft, non-distended Ext: no edema Psych: pleasant, normal affect Skin: intact Neuro: Alert and oriented x3 Musculoskeletal: 4/5 strength throughout lower extremities, 4+5 strength throughout upper extremities, MaxAx2 sit to stand, able to maintain standing balance for 30 seconds, limited by fatigue GU: Rectal tube in place  Results for orders  placed or performed during the hospital encounter of 01/12/21 (from the past 24 hour(s))  Glucose, capillary     Status: Abnormal   Collection Time: 02/01/21  4:44 PM  Result Value Ref Range   Glucose-Capillary 262 (H) 70 - 99 mg/dL  Glucose, capillary     Status: Abnormal   Collection Time: 02/01/21  8:23 PM  Result Value Ref Range   Glucose-Capillary 232 (H) 70 - 99 mg/dL  Basic metabolic panel     Status: Abnormal   Collection Time: 02/02/21  4:09 AM  Result Value Ref Range   Sodium 140 135 - 145 mmol/L   Potassium 4.0 3.5 - 5.1 mmol/L   Chloride 107 98 - 111 mmol/L   CO2 25 22 - 32 mmol/L   Glucose, Bld 124 (H) 70 - 99 mg/dL   BUN 26 (H) 6 - 20 mg/dL   Creatinine, Ser 7.62 (H) 0.61 - 1.24 mg/dL   Calcium 8.8 (L) 8.9 - 10.3 mg/dL   GFR, Estimated 58 (L) >60 mL/min   Anion gap 8 5 - 15  Magnesium     Status: None   Collection Time: 02/02/21  4:09 AM  Result Value Ref Range   Magnesium 1.9 1.7 - 2.4 mg/dL  Glucose, capillary     Status: Abnormal   Collection Time: 02/02/21  8:37 AM  Result Value Ref Range   Glucose-Capillary 111 (H) 70 - 99 mg/dL  Glucose, capillary     Status: Abnormal   Collection Time: 02/02/21 11:27 AM  Result Value Ref Range   Glucose-Capillary 178 (H) 70 - 99 mg/dL   No results found.   Assessment/Plan: Diagnosis: COVID-19 debility 1. Does the need for close, 24 hr/day medical supervision in concert with the patient's rehab needs make it unreasonable for this patient to be served in a less intensive setting? Yes 2. Co-Morbidities requiring supervision/potential complications:  1. DKA: -he would benefit from CBG monitoring 2. Metabolic encephalopathy: he would benefit form SLP  3. ARDS: he would benefit from respiratory monitoring TID 4. Morbid obesity (BMI 49.64) 5. Debility: he would benefit from intensive PT and OT 3. Due to bladder management, bowel management, safety, skin/wound care, disease management, medication administration, pain  management and patient education, does the patient require 24 hr/day rehab nursing? Yes 4. Does the patient require coordinated care of a physician, rehab nurse, therapy disciplines of PT, OT, SLP to address physical and functional deficits in the context of the above medical diagnosis(es)? Yes Addressing deficits in the following areas: balance, endurance, locomotion, strength, transferring, bowel/bladder control, bathing, dressing, feeding, grooming, toileting, cognition and psychosocial support 5. Can the patient actively participate in an intensive therapy program of at least 3 hrs of therapy per day at least 5 days per week? Yes 6. The potential for patient to make measurable gains while on inpatient rehab is good 7. Anticipated functional outcomes upon discharge from inpatient rehab are MinA with PT, min assist with OT, supervision with SLP. 8. Estimated rehab length of stay to reach the above functional goals is: 3-4 weeks 9. Anticipated discharge destination: Home 10. Overall Rehab/Functional Prognosis: excellent  RECOMMENDATIONS: This patient's condition is appropriate for continued rehabilitative care in the following setting: CIR Patient has agreed to participate in recommended program. Yes Note that insurance prior authorization may be required for reimbursement for recommended care.  Comment: Thank you for this consult. Admission coordinator to follow.   I have personally performed a face to face diagnostic evaluation, including, but not limited to relevant history and physical exam findings, of this patient and developed relevant assessment and plan.  Additionally, I have reviewed and concur with the physician assistant's documentation above.  Horton Chin, MD 02/02/2021

## 2021-02-02 NOTE — Progress Notes (Signed)
Inpatient Rehab Admissions Coordinator:   I have no beds available for this patient to admit to CIR today.  Will continue to follow for timing of potential admission pending bed availability.   Estill Dooms, PT, DPT Admissions Coordinator [phone] 02/02/21  9:50 AM

## 2021-02-02 NOTE — Progress Notes (Signed)
PROGRESS NOTE    Erik [name]  [name] DOB: 1991-01-14 DOA: 01/12/2021 PCP: Pcp, No   Brief Narrative:   30 yo morbidly obese male with Asthma now with progressive multiorgan failure with COVID 19 pneumonia and ARDS with severe acidosis and severe hypoxia with ARDS with acute renal failure; now extubated and off renal replacement therapy.  Initially due to severe respiratory failure she was intubated was found to be in acute kidney injury started on CRRT.  She was found to be COVID-19 positive requiring treatment including remdesivir and steroids.  She was not deemed to be candidate for baricitinib/Actemra.  On 2/8C suffered from acute MI/STEMI therefore taken for left heart catheterization 2/9.  Successfully extubated on 2/11.  Patient renal function continues to improve including his breathing.  Slowly advancing his diet and tolerating well.  PT recommended CIR therefore awaiting authorization.  Does have persistent sinus tachycardia therefore metoprolol being adjusted.  Assessment & Plan:   Principal Problem:   Acute hypoxemic respiratory failure due to COVID-19 [facility]) Active Problems:   DKA (diabetic ketoacidosis) (HCC)   Obesity, Class III, BMI 40-49.9 (morbid obesity) (HCC)   Acute metabolic encephalopathy   ARDS (adult respiratory distress syndrome) (HCC)   ST elevation myocardial infarction (STEMI) (HCC)   Acute respiratory failure with hypoxia (HCC)   VT (ventricular tachycardia) (HCC)   Elevated troponin   Acute hypoxemic respiratory failure due to severe COVID-19 pneumonia/ARDS 2/1 Rquire Mechanical ventilation  Successfully extubated 01/22/21 -currently on room air at rest Completed course of remdesivir and antibiotics. Due to suspected CAP no baricitinib/actemra Completed prednisone on 2/21.  Isolation discontinued 2/21.  AKI with acute renal failure, resolving -Initially required CRRT but now has resolved.  Creatinine today 1.63.  Continues to improve.  Acute  inferior STEMI with associated diastolic heart failure and Vtach Sinus tachycardia -Cardiology team is following.  Status post left heart cath 2/9-no significant CAD seen.  Suspect myocarditis.  -Echocardiogram 2/9-EF 60 to 65% -LDL 65. A1c is 11.8 -Metoprolol 50 mg twice daily.  Ambulatory dysfunction, profound  -Secondary to acute illness.  PT/OT-recommending CIR  Profound hyperglycemia secondary to newly diagnosed uncontrolled diabetes - insulin dependent -Hemoglobin A1c-11.8 -Insulin sliding scale and Accu-Chek -Diabetic coordinator following -Increase Levemir 37 units -Increase NovoLog 8 units 3 times Premeal  Loose stools-discontinue daily MiraLAX and Colace.  PT-CIR   DVT prophylaxis: Subcu heparin Code Status: Full code Family Communication: None  Status is: Inpatient  Remains inpatient appropriate because:Inpatient level of care appropriate due to severity of illness   Dispo: The patient is from: Home              Anticipated d/c is to: CIR              Anticipated d/c date is: 1 day              Patient currently is medically stable to be transition to CIR when bed is available.  CIR team following.   Difficult to place patient No  Body mass index is 49.64 kg/m.       Subjective: Feels great no complaints  Review of Systems Otherwise negative except as per HPI, including: General: Denies fever, chills, night sweats or unintended weight loss. Resp: Denies cough, wheezing, shortness of breath. Cardiac: Denies chest pain, palpitations, orthopnea, paroxysmal nocturnal dyspnea. GI: Denies abdominal pain, nausea, vomiting, diarrhea or constipation GU: Denies dysuria, frequency, hesitancy or incontinence MS: Denies muscle aches, joint pain or swelling Neuro: Denies headache, neurologic deficits (  focal weakness, numbness, tingling), abnormal gait Psych: Denies anxiety, depression, SI/HI/AVH Skin: Denies new rashes or lesions ID: Denies sick contacts, exotic  exposures, travel  Examination: Constitutional: Not in acute distress.  Morbidly obese Respiratory: Clear to auscultation bilaterally Cardiovascular: Normal sinus rhythm, no rubs Abdomen: Nontender nondistended good bowel sounds Musculoskeletal: No edema noted Skin: No rashes seen Neurologic: CN 2-12 grossly intact.  And nonfocal Psychiatric: Normal judgment and insight. Alert and oriented x 3. Normal mood. Rectal tube in place Objective: Vitals:   02/02/21 0735 02/02/21 0800 02/02/21 0812 02/02/21 1154  BP: (!) 152/103 (!) 158/105 (!) 147/95 (!) 148/95  Pulse: (!) 105 (!) 103 (!) 105 (!) 117  Resp: 16 16 18 16   Temp: 98 F (36.7 C) 98.1 F (36.7 C) 98.3 F (36.8 C) 98.2 F (36.8 C)  TempSrc: Oral Oral    SpO2: 98% 98% 99% 100%  Weight:      Height:        Intake/Output Summary (Last 24 hours) at 02/02/2021 1203 Last data filed at 02/02/2021 0000 Gross per 24 hour  Intake 720 ml  Output 600 ml  Net 120 ml   Filed Weights   01/31/21 0500 02/01/21 0319 02/02/21 0500  Weight: (!) 168.7 kg (!) 161.5 kg (!) 166 kg     Data Reviewed:   CBC: Recent Labs  Lab 01/27/21 1105 01/28/21 1050 01/29/21 0515 01/30/21 0414 01/31/21 0402  WBC 8.8 9.6 9.0 8.1 9.4  NEUTROABS 6.2 8.0* 5.8 4.9 5.9  HGB 9.1* 9.6* 8.9* 9.2* 8.3*  HCT 30.7* 33.5* 30.9* 30.3* 26.9*  MCV 88.5 88.6 89.3 87.8 85.7  PLT 350 337 310 268 256   Basic Metabolic Panel: Recent Labs  Lab 01/28/21 1050 01/29/21 0515 01/30/21 0414 01/31/21 0402 02/01/21 0429 02/02/21 0409  NA 150* 151* 145  --  139 140  K 4.6 4.1 4.2  --  4.1 4.0  CL 116* 119* 113*  --  107 107  CO2 24 24 26   --  24 25  GLUCOSE 260* 135* 144*  --  146* 124*  BUN 39* 33* 29*  --  25* 26*  CREATININE 2.35* 2.15* 1.96*  --  1.73* 1.63*  CALCIUM 9.2 9.1 8.7*  --  8.5* 8.8*  MG 2.1 1.9 1.6* 2.1 2.0 1.9   GFR: Estimated Creatinine Clearance: 106.9 mL/min (A) (by C-G formula based on SCr of 1.63 mg/dL (H)). Liver Function  Tests: Recent Labs  Lab 01/28/21 1050 01/29/21 0515 01/30/21 0414  AST 29 14* 13*  ALT 25 19 18   ALKPHOS 70 64 58  BILITOT 0.4 0.5 0.5  PROT 6.9 6.1* 6.3*  ALBUMIN 2.6* 2.4* 2.7*   No results for input(s): LIPASE, AMYLASE in the last 168 hours. No results for input(s): AMMONIA in the last 168 hours. Coagulation Profile: No results for input(s): INR, PROTIME in the last 168 hours. Cardiac Enzymes: No results for input(s): CKTOTAL, CKMB, CKMBINDEX, TROPONINI in the last 168 hours. BNP (last 3 results) No results for input(s): PROBNP in the last 8760 hours. HbA1C: No results for input(s): HGBA1C in the last 72 hours. CBG: Recent Labs  Lab 02/01/21 1214 02/01/21 1644 02/01/21 2023 02/02/21 0837 02/02/21 1127  GLUCAP 189* 262* 232* 111* 178*   Lipid Profile: No results for input(s): CHOL, HDL, LDLCALC, TRIG, CHOLHDL, LDLDIRECT in the last 72 hours. Thyroid Function Tests: No results for input(s): TSH, T4TOTAL, FREET4, T3FREE, THYROIDAB in the last 72 hours. Anemia Panel: Recent Labs    01/31/21 0402  FERRITIN 285   Sepsis Labs: No results for input(s): PROCALCITON, LATICACIDVEN in the last 168 hours.  No results found for this or any previous visit (from the past 240 hour(s)).       Radiology Studies: No results found.      Scheduled Meds: . albuterol  1-2 puff Inhalation Q6H  . aspirin  81 mg Oral Daily  . atorvastatin  80 mg Oral Daily  . Chlorhexidine Gluconate Cloth  6 each Topical Daily  . clopidogrel  75 mg Oral Daily  . enoxaparin (LOVENOX) injection  0.5 mg/kg Subcutaneous Q24H  . famotidine  20 mg Oral Daily  . Gerhardt's butt cream   Topical BID  . insulin aspart  0-20 Units Subcutaneous TID AC & HS  . insulin aspart  8 Units Subcutaneous TID WC  . insulin detemir  37 Units Subcutaneous Daily  . living well with diabetes book   Does not apply Once  . mouth rinse  15 mL Mouth Rinse BID  . metoprolol tartrate  50 mg Oral BID  . multivitamin  with minerals  1 tablet Oral Daily  . Ensure Max Protein  11 oz Oral BID BM  . sodium chloride flush  3 mL Intravenous Q12H   Continuous Infusions: . sodium chloride       LOS: 21 days   Time spent= 35 mins    Niaya Hickok Joline Maxcy, MD [facility]  If 7PM-7AM, please contact night-coverage  02/02/2021, 12:03 PM

## 2021-02-02 NOTE — Progress Notes (Signed)
Met w/ pt again at bedside.  Re-reviewed using insulin at home after he gets out of Rehab.  Pt told me the RN let him give his own insulin injections yesterday afternoon and that he felt better about it.  RN to continue education with pt today.  Orally re-reviewed how to use insulin pen at home (pt was initially shown insulin pen 2/17).  I have also spoken with pt's Mom by phone and emailed a Youtube video of using insulin pen at home to both pt and Mom.   --Will follow patient during hospitalization--  Wyn Quaker RN, MSN, CDE Diabetes Coordinator Inpatient Glycemic Control Team Team Pager: [phone] (8a-5p)

## 2021-02-02 NOTE — Progress Notes (Signed)
Speech Language Pathology Treatment: Dysphagia  Patient Details Name: Erik [name] MRN: [id number] DOB: 12-29-1990 Today's Date: 02/02/2021 Time: 7544-9201 SLP Time Calculation (min) (ACUTE ONLY): 35 min  Assessment / Plan / Recommendation Clinical Impression  Pt seen for ongoing assessment of swallowing. He appears much improved and alert today; verbally responsive and able to follow instructions w/ min cues. Pt is on RA; wbc wnl. During this acute illness of Acute hypoxemic respiratory failure due to severe COVID-19 pneumonia/ARDS, Acute inferior STEMI with associated diastolic heart failure and Vtach Sinus tachycardia and Profound hyperglycemia secondary to newly diagnosed uncontrolled diabetes occurred. Pt also has Ambulatory dysfunction, profoundw/ CIR recommended by PT/OT services. Pt has Obesity, Class III, BMI 40-49.9 (morbid obesity) and is deconditioned s/p lengthy illness. Dietician following for support.    Pt appears to continue to improve. He was alert and verbally engaged w/ SLP easily; noted much improved vocal quality w/ WNL volume of speech and clear vocal quality today. Pt has been tolerating a dysphagia level 3(mech soft) diet w/ thin liquids w/out deficits per pt/NSG. Pt was eager to have "regular" foods including a chicken quesadilla for lunch today. SLP explained general aspiration precautions, and pt agreed verbally to the need for following them especially sitting upright for all oral intake and drinking slowly. Pt consumed trials of thin liquids via Cup and bites of his sandwich and bag of chips w/ his Lunch meal. NO overt clinical s/s of aspiration were noted w/ any consistency; respiratory status remained calm and unlabored, vocal quality clear b/t trials. Pt drank via Cup following instructions for small sips slowly. Oral phase appeared grossly [facility] for bolus management of solids and timely A-P transfer for swallowing; oral clearing achieved w/ all consistencies. Encouraged  pt to take small bites and not overfill his mouth when eating at meals; less talking w/ mouth full also encouraged to lessen risk for choking. Pt described that he was doing "well" swallowing his Pills w/ Milk vs using a Puree -- encouraged him to use what he felt worked for him for ease of swallowing the Pills(d/t Baseline difficulty swallowing pills prior to this admit per his report).  Recommend continue upgraded Regular diet w/ gravies added to moisten foods; Thin liquids. Recommend general aspiration precautions; Pills Whole in Puree if needed for easier swallowing; tray setup and positioning assistance for meals as needed. ST services available for any new needs while admitted. NSG updated. Precautions posted at bedside.     HPI HPI: Per admitting H&P " Erik [name] is a 30 y.o. male with medical history significant for not on previous prescription medications, has asthma however has not been a provider since he was a teenager.     He presents via the mother for chief concerns of weakness, poor p.o. intake, lethargy that has been ongoing for the last 2 weeks, 12/27/2020.  She denies fever at home.  She reports that he endorses nausea and vomiting.  He has had poor p.o. intake but has been drinking apple juice and orange juice" After presenting to the ER with progressive weakness, decreased PO intake, nausea/vomiting, he wasadmitted for management of acute hypoxic respiratory failure due to COVID-19 viral infection, DKA and metabolic encephalopathy.  Pt was emergently intubated 2/1, extubated 01/23/21.  Pt on RA now; much improved overall awaiting d/c to Rehab.      SLP Plan  All goals met       Recommendations  Diet recommendations: Regular;Thin liquid Liquids provided via: Cup;Straw Medication Administration:  Whole meds with liquid (vs Whole in puree as desired for easier swallowing(baseline)) Supervision: Patient able to self feed (setup support) Compensations: Minimize environmental  distractions;Small sips/bites;Slow rate;Follow solids with liquid Postural Changes and/or Swallow Maneuvers: Out of bed for meals;Seated upright 90 degrees;Upright 30-60 min after meal                General recommendations:  (Dietician following) Oral Care Recommendations: Oral care BID;Oral care before and after PO;Patient independent with oral care Follow up Recommendations: None (for ST services indicated currently) SLP Visit Diagnosis: Dysphagia, unspecified (R13.10) Plan: All goals met       GO               Orinda Kenner, MS, CCC-SLP Speech Language Pathologist Rehab Services [phone] [facility] 02/02/2021, 3:27 PM

## 2021-02-03 ENCOUNTER — Inpatient Hospital Stay (HOSPITAL_COMMUNITY)
Admission: RE | Admit: 2021-02-03 | Discharge: 2021-02-26 | DRG: 945 | Disposition: A | Discharge status: Home / Self Care | Payer: Self-pay | Source: Other Acute Inpatient Hospital | Attending: Physical Medicine & Rehabilitation | Admitting: Physical Medicine & Rehabilitation

## 2021-02-03 ENCOUNTER — Other Ambulatory Visit: Payer: Self-pay

## 2021-02-03 ENCOUNTER — Encounter (HOSPITAL_COMMUNITY): Payer: Self-pay | Admitting: Physical Medicine & Rehabilitation

## 2021-02-03 DIAGNOSIS — E119 Type 2 diabetes mellitus without complications: Secondary | ICD-10-CM

## 2021-02-03 DIAGNOSIS — Z8616 Personal history of COVID-19: Secondary | ICD-10-CM

## 2021-02-03 DIAGNOSIS — E785 Hyperlipidemia, unspecified: Secondary | ICD-10-CM | POA: Diagnosis present

## 2021-02-03 DIAGNOSIS — Z6841 Body Mass Index (BMI) 40.0 and over, adult: Secondary | ICD-10-CM

## 2021-02-03 DIAGNOSIS — I252 Old myocardial infarction: Secondary | ICD-10-CM

## 2021-02-03 DIAGNOSIS — I10 Essential (primary) hypertension: Secondary | ICD-10-CM

## 2021-02-03 DIAGNOSIS — E669 Obesity, unspecified: Secondary | ICD-10-CM | POA: Diagnosis present

## 2021-02-03 DIAGNOSIS — R5381 Other malaise: Principal | ICD-10-CM | POA: Diagnosis present

## 2021-02-03 DIAGNOSIS — I2119 ST elevation (STEMI) myocardial infarction involving other coronary artery of inferior wall: Secondary | ICD-10-CM | POA: Diagnosis present

## 2021-02-03 DIAGNOSIS — G7281 Critical illness myopathy: Secondary | ICD-10-CM

## 2021-02-03 DIAGNOSIS — Z79899 Other long term (current) drug therapy: Secondary | ICD-10-CM

## 2021-02-03 DIAGNOSIS — I5030 Unspecified diastolic (congestive) heart failure: Secondary | ICD-10-CM | POA: Diagnosis present

## 2021-02-03 DIAGNOSIS — E1165 Type 2 diabetes mellitus with hyperglycemia: Secondary | ICD-10-CM | POA: Diagnosis present

## 2021-02-03 DIAGNOSIS — N179 Acute kidney failure, unspecified: Secondary | ICD-10-CM

## 2021-02-03 DIAGNOSIS — J45909 Unspecified asthma, uncomplicated: Secondary | ICD-10-CM | POA: Diagnosis present

## 2021-02-03 DIAGNOSIS — K5900 Constipation, unspecified: Secondary | ICD-10-CM | POA: Diagnosis present

## 2021-02-03 DIAGNOSIS — I110 Hypertensive heart disease with heart failure: Secondary | ICD-10-CM | POA: Diagnosis present

## 2021-02-03 HISTORY — DX: Type 2 diabetes mellitus without complications: E11.9

## 2021-02-03 LAB — BASIC METABOLIC PANEL
Anion gap: 8 (ref 5–15)
BUN: 27 mg/dL — ABNORMAL HIGH (ref 6–20)
CO2: 23 mmol/L (ref 22–32)
Calcium: 8.5 mg/dL — ABNORMAL LOW (ref 8.9–10.3)
Chloride: 108 mmol/L (ref 98–111)
Creatinine, Ser: 1.69 mg/dL — ABNORMAL HIGH (ref 0.61–1.24)
GFR, Estimated: 56 mL/min — ABNORMAL LOW (ref >60)
Glucose, Bld: 111 mg/dL — ABNORMAL HIGH (ref 70–99)
Potassium: 3.8 mmol/L (ref 3.5–5.1)
Sodium: 139 mmol/L (ref 135–145)

## 2021-02-03 LAB — GLUCOSE, CAPILLARY
Glucose-Capillary: 114 mg/dL — ABNORMAL HIGH (ref 70–99)
Glucose-Capillary: 117 mg/dL — ABNORMAL HIGH (ref 70–99)
Glucose-Capillary: 147 mg/dL — ABNORMAL HIGH (ref 70–99)
Glucose-Capillary: 131 mg/dL — ABNORMAL HIGH (ref 70–99)

## 2021-02-03 LAB — PHOSPHORUS: Phosphorus: 4.5 mg/dL (ref 2.5–4.6)

## 2021-02-03 LAB — MAGNESIUM: Magnesium: 1.7 mg/dL (ref 1.7–2.4)

## 2021-02-03 MED ORDER — CLOPIDOGREL BISULFATE 75 MG PO TABS: 75.0000 mg | ORAL_TABLET | Freq: Every day | ORAL | Status: DC

## 2021-02-03 MED ORDER — INSULIN DETEMIR 100 UNIT/ML ~~LOC~~ SOLN: 37.0000 [IU] | Freq: Every day | SUBCUTANEOUS | 11 refills | Status: DC

## 2021-02-03 MED ORDER — INSULIN ASPART 100 UNIT/ML ~~LOC~~ SOLN: 0.0000 [IU] | Freq: Three times a day (TID) | SUBCUTANEOUS | 11 refills | Status: DC

## 2021-02-03 MED ORDER — ASPIRIN 81 MG PO CHEW: 81.0000 mg | CHEWABLE_TABLET | Freq: Every day | ORAL | Status: DC

## 2021-02-03 MED ORDER — DM-GUAIFENESIN ER 30-600 MG PO TB12
1.0000 | ORAL_TABLET | Freq: Two times a day (BID) | ORAL | Status: DC | PRN
  Administered 2021-02-18: 1 via ORAL
  Filled 2021-02-03: qty 1

## 2021-02-03 MED ORDER — DM-GUAIFENESIN ER 30-600 MG PO TB12: 1.0000 | ORAL_TABLET | Freq: Two times a day (BID) | ORAL | Status: DC | PRN

## 2021-02-03 MED ORDER — ATORVASTATIN CALCIUM 80 MG PO TABS
80.0000 mg | ORAL_TABLET | Freq: Every day | ORAL | Status: DC
  Administered 2021-02-04 – 2021-02-26 (×23): 80 mg via ORAL
  Filled 2021-02-03 (×23): qty 1

## 2021-02-03 MED ORDER — DOCUSATE SODIUM 100 MG PO CAPS: 100.0000 mg | ORAL_CAPSULE | Freq: Two times a day (BID) | ORAL | 0 refills | Status: DC | PRN

## 2021-02-03 MED ORDER — CLOPIDOGREL BISULFATE 75 MG PO TABS
75.0000 mg | ORAL_TABLET | Freq: Every day | ORAL | Status: DC
  Administered 2021-02-04 – 2021-02-26 (×23): 75 mg via ORAL
  Filled 2021-02-03 (×23): qty 1

## 2021-02-03 MED ORDER — NYSTATIN-TRIAMCINOLONE 100000-0.1 UNIT/GM-% EX CREA
TOPICAL_CREAM | Freq: Two times a day (BID) | CUTANEOUS | Status: DC
  Filled 2021-02-03 (×2): qty 15

## 2021-02-03 MED ORDER — ENSURE MAX PROTEIN PO LIQD
11.0000 [oz_av] | Freq: Two times a day (BID) | ORAL | Status: DC
  Administered 2021-02-04 – 2021-02-17 (×26): 11 [oz_av] via ORAL
  Filled 2021-02-03 (×5): qty 330

## 2021-02-03 MED ORDER — GERHARDT'S BUTT CREAM
TOPICAL_CREAM | Freq: Two times a day (BID) | CUTANEOUS | Status: AC
  Filled 2021-02-03: qty 1

## 2021-02-03 MED ORDER — INSULIN ASPART 100 UNIT/ML ~~LOC~~ SOLN
0.0000 [IU] | Freq: Three times a day (TID) | SUBCUTANEOUS | Status: DC
  Administered 2021-02-03 – 2021-02-07 (×11): 3 [IU] via SUBCUTANEOUS
  Administered 2021-02-08: 2 [IU] via SUBCUTANEOUS
  Administered 2021-02-08 – 2021-02-25 (×15): 3 [IU] via SUBCUTANEOUS

## 2021-02-03 MED ORDER — ACETAMINOPHEN 160 MG/5ML PO SOLN
650.0000 mg | ORAL | Status: DC | PRN
  Filled 2021-02-03 (×2): qty 20.3

## 2021-02-03 MED ORDER — INSULIN ASPART 100 UNIT/ML ~~LOC~~ SOLN: 8.0000 [IU] | Freq: Three times a day (TID) | SUBCUTANEOUS | 11 refills | Status: DC

## 2021-02-03 MED ORDER — FAMOTIDINE 20 MG PO TABS
20.0000 mg | ORAL_TABLET | Freq: Every day | ORAL | Status: DC
  Administered 2021-02-04 – 2021-02-26 (×23): 20 mg via ORAL
  Filled 2021-02-03 (×23): qty 1

## 2021-02-03 MED ORDER — DOCUSATE SODIUM 100 MG PO CAPS: 100.0000 mg | ORAL_CAPSULE | Freq: Two times a day (BID) | ORAL | Status: DC | PRN

## 2021-02-03 MED ORDER — FAMOTIDINE 20 MG PO TABS: 20.0000 mg | ORAL_TABLET | Freq: Every day | ORAL | Status: DC

## 2021-02-03 MED ORDER — ENSURE MAX PROTEIN PO LIQD: 11.0000 [oz_av] | Freq: Two times a day (BID) | ORAL | Status: DC

## 2021-02-03 MED ORDER — ONDANSETRON HCL 4 MG PO TABS: 4.0000 mg | ORAL_TABLET | Freq: Four times a day (QID) | ORAL | Status: DC | PRN

## 2021-02-03 MED ORDER — LIP MEDEX EX OINT
1.0000 "application " | TOPICAL_OINTMENT | CUTANEOUS | Status: DC | PRN
  Filled 2021-02-03: qty 7

## 2021-02-03 MED ORDER — ENOXAPARIN SODIUM 80 MG/0.8ML ~~LOC~~ SOLN
0.5000 mg/kg | SUBCUTANEOUS | Status: DC
  Filled 2021-02-03 (×2): qty 1.6

## 2021-02-03 MED ORDER — OXYCODONE HCL 5 MG PO TABS
5.0000 mg | ORAL_TABLET | ORAL | Status: DC | PRN
  Administered 2021-02-04: 5 mg via ORAL
  Filled 2021-02-03: qty 1

## 2021-02-03 MED ORDER — INSULIN ASPART 100 UNIT/ML ~~LOC~~ SOLN
8.0000 [IU] | Freq: Three times a day (TID) | SUBCUTANEOUS | Status: DC
  Administered 2021-02-03 – 2021-02-25 (×64): 8 [IU] via SUBCUTANEOUS

## 2021-02-03 MED ORDER — ONDANSETRON HCL 4 MG/2ML IJ SOLN: 4.0000 mg | Freq: Four times a day (QID) | INTRAMUSCULAR | Status: DC | PRN

## 2021-02-03 MED ORDER — INSULIN DETEMIR 100 UNIT/ML ~~LOC~~ SOLN
37.0000 [IU] | Freq: Every day | SUBCUTANEOUS | Status: DC
  Administered 2021-02-04 – 2021-02-26 (×23): 37 [IU] via SUBCUTANEOUS
  Filled 2021-02-03 (×23): qty 0.37

## 2021-02-03 MED ORDER — ASPIRIN 81 MG PO CHEW
81.0000 mg | CHEWABLE_TABLET | Freq: Every day | ORAL | Status: DC
  Administered 2021-02-04 – 2021-02-26 (×23): 81 mg via ORAL
  Filled 2021-02-03 (×23): qty 1

## 2021-02-03 MED ORDER — ENOXAPARIN SODIUM 100 MG/ML ~~LOC~~ SOLN
82.5000 mg | SUBCUTANEOUS | Status: DC
  Administered 2021-02-03 – 2021-02-09 (×7): 82.5 mg via SUBCUTANEOUS
  Filled 2021-02-03 (×8): qty 0.82

## 2021-02-03 MED ORDER — ATORVASTATIN CALCIUM 80 MG PO TABS: 80.0000 mg | ORAL_TABLET | Freq: Every day | ORAL | Status: DC

## 2021-02-03 MED ORDER — ADULT MULTIVITAMIN W/MINERALS CH
1.0000 | ORAL_TABLET | Freq: Every day | ORAL | Status: DC
  Administered 2021-02-04 – 2021-02-26 (×23): 1 via ORAL
  Filled 2021-02-03 (×23): qty 1

## 2021-02-03 MED ORDER — ALBUTEROL SULFATE HFA 108 (90 BASE) MCG/ACT IN AERS
1.0000 | INHALATION_SPRAY | Freq: Four times a day (QID) | RESPIRATORY_TRACT | Status: DC
  Administered 2021-02-03 – 2021-02-04 (×4): 2 via RESPIRATORY_TRACT
  Filled 2021-02-03: qty 6.7

## 2021-02-03 MED ORDER — METOPROLOL TARTRATE 50 MG PO TABS
50.0000 mg | ORAL_TABLET | Freq: Two times a day (BID) | ORAL | Status: DC
  Administered 2021-02-03 – 2021-02-09 (×12): 50 mg via ORAL
  Filled 2021-02-03 (×12): qty 1

## 2021-02-03 MED ORDER — ALBUTEROL SULFATE HFA 108 (90 BASE) MCG/ACT IN AERS: 1.0000 | INHALATION_SPRAY | Freq: Four times a day (QID) | RESPIRATORY_TRACT | Status: DC

## 2021-02-03 MED ORDER — POLYETHYLENE GLYCOL 3350 17 G PO PACK: 17.0000 g | PACK | Freq: Every day | ORAL | 0 refills | Status: DC | PRN

## 2021-02-03 MED ORDER — METOPROLOL TARTRATE 50 MG PO TABS: 50.0000 mg | ORAL_TABLET | Freq: Two times a day (BID) | ORAL | Status: DC

## 2021-02-03 MED ORDER — ONDANSETRON HCL 4 MG PO TABS: 4.0000 mg | ORAL_TABLET | Freq: Four times a day (QID) | ORAL | 0 refills | Status: DC | PRN

## 2021-02-03 MED ORDER — ENOXAPARIN SODIUM 100 MG/ML ~~LOC~~ SOLN: 82.5000 mg | SUBCUTANEOUS | Status: DC

## 2021-02-03 MED ORDER — POLYETHYLENE GLYCOL 3350 17 G PO PACK: 17.0000 g | PACK | Freq: Every day | ORAL | Status: DC | PRN

## 2021-02-03 MED ORDER — ADULT MULTIVITAMIN W/MINERALS CH: 1.0000 | ORAL_TABLET | Freq: Every day | ORAL | Status: AC

## 2021-02-03 NOTE — Progress Notes (Signed)
Inpatient Rehabilitation  Patient information reviewed and entered into eRehab system by Ted Leonhart M. Salvadore Valvano, M.A., CCC/SLP, PPS Coordinator.  Information including medical coding, functional ability and quality indicators will be reviewed and updated through discharge.    

## 2021-02-03 NOTE — TOC Transition Note (Signed)
Transition of Care [facility]) - CM/SW Discharge Note   Patient Details  Name: Erik [name] MRN: [id number] Date of Birth: 1991/07/26  [facility]) CM/SW Contact:  Caryn Section, RN Phone Number: 02/03/2021, 10:37 AM   Clinical Narrative:   Patient to be transferred to CIR today via [facility].  No further concerns.  TOC signing off.    Final next level of care: IP Rehab Facility Barriers to Discharge: Barriers Resolved   Patient Goals and CMS Choice Patient states their goals for this hospitalization and ongoing recovery are:: CIR CMS Medicare.gov Compare Post Acute Care list provided to:: Patient Represenative (must comment) Choice offered to / list presented to : Parent  Discharge Placement                Patient to be transferred to facility by: [facility]   Patient and family notified of of transfer: 02/03/21  Discharge Plan and Services                                     Social Determinants of Health (SDOH) Interventions     Readmission Risk Interventions No flowsheet data found.

## 2021-02-03 NOTE — Progress Notes (Addendum)
Marcello Fennel, MD  Physician  Physical Medicine and Rehabilitation  PMR Pre-admission     Addendum  Date of Service:  02/03/2021 10:51 AM      Related encounter: ED to Hosp-Admission (Discharged) from 01/12/2021 in [facility] (1C)          Show:Clear all [x] Manual[x] Template[x] Copied  Added by: [x] , PT   [] Hover for details  PMR Admission Coordinator Pre-Admission Assessment  Patient: Erik [name] is an 30 y.o., male MRN: DOB: 07-11-1991 Height: 6' (182.9 cm) Weight: (!) 166 kg                                                                                                                                                  Insurance Information HMO:     PPO:      PCP:      IPA:      80/20:     OTHER:  PRIMARY: Uninsured. Estimate provided on 02/03/21      Policy#:       Subscriber:  CM Name:       Phone#:      Fax#:  Pre-Cert#:       Employer:  Benefits:  Phone #:      Name:  Eff. Date:      Deduct:       Out of Pocket Max:       Life Max:   CIR:       SNF:  Outpatient:     Co-Pay:  Home Health:       Co-Pay:  DME:      Co-Pay:  Providers:  SECONDARY:       Policy#:       Phone#:   [phone]:      Phone#:   The 03/12/1991" for patients in Inpatient Rehabilitation Facilities with attached "Privacy Act Statement-Health Care Records" was provided and verbally reviewed with: N/A  Emergency Contact Information         Contact Information    Name Relation Home Work DEANE, WATTENBARGER Mother Artist  [phone]     Current Medical History  Patient Admitting Diagnosis: post-covid debility   History of Present Illness: Erik [name] is a 30 year old right-handed male with unremarkable past medical history except obesity with BMI 49.64 on no prescription medications and is not followed by a medical provider. Presented 01/12/2021 with progressive  weakness, poor p.o. intake, lethargy x2 weeks. Denied any fever. Patient with bouts of nausea vomiting. Chest x-ray showed cardiomegaly. No pneumothorax. Bilateral pulmonary infiltrate/edema admission chemistry sodium 152 potassium 5.7 glucose 802 BUN 74 creatinine 3.06 total bilirubin 1.8, WBC 17,300, hemoglobin 15.8, lactic acid 3.3, blood cultures no growth to date, hemoglobin A1c 13.6 SARS coronavirus positive.. Patient was placed on intravenous remdesiviras well  as Decadron. Airborne and contact precautions implementedand discontinued 2/21 and patient currently maintained on room air. Renal ultrasound showed no obstructive uropathy. Patient did initially require intubation for airway protection. Renal service was consultedand CRRT initiated. Hospital course complicated by increased chest pain 01/19/2021 EKG MI/STEMI as well as episode of sustained ventricular tachycardia taken to the cardiac Cath Lab 01/20/2021 that showed no significant CAD suspect myocarditis and continues to be followed by Dr. Cristal Deer End. Echocardiogram with ejection fraction of 60 to 65%. Patient was placed on metoprolol as well as low-dose aspirin and Plavix. Lovenox ongoing for DVT prophylaxis. Insulin initiate as well as follow-up diabetic coordinator for diabetes mellitus. AKI has resolved CRRT discontinued latest creatinine 1.69. He is tolerating a regular diet. Therapy evaluations completed due to patient decreased functional mobility was recommended for a comprehensive rehab program.  Past Medical History      Past Medical History:  Diagnosis Date  . Asthma     Family History  family history is not on file.  Prior Rehab/Hospitalizations:  Has the patient had prior rehab or hospitalizations prior to admission? No  Has the patient had major surgery during 100 days prior to admission? Yes  Current Medications   Current Facility-Administered Medications:  .  0.9 %  sodium chloride  infusion, 250 mL, Intravenous, PRN, End, Christopher, MD .  acetaminophen (TYLENOL) 160 MG/5ML solution 650 mg, 650 mg, Oral, Q4H PRN, Ronnald Ramp, RPH .  albuterol (VENTOLIN HFA) 108 (90 Base) MCG/ACT inhaler 1-2 puff, 1-2 puff, Inhalation, Q6H, Amin, Ankit Chirag, MD, 2 puff at 02/03/21 0934 .  aspirin chewable tablet 81 mg, 81 mg, Oral, Daily, Ronnald Ramp, RPH, 81 mg at 02/03/21 1610 .  atorvastatin (LIPITOR) tablet 80 mg, 80 mg, Oral, Daily, Ronnald Ramp, RPH, 80 mg at 02/03/21 9604 .  Chlorhexidine Gluconate Cloth 2 % PADS 6 each, 6 each, Topical, Daily, End, Christopher, MD, 6 each at 02/03/21 [phone] .  clopidogrel (PLAVIX) tablet 75 mg, 75 mg, Oral, Daily, Agbor-Etang, Arlys John, MD, 75 mg at 02/03/21 0937 .  dextromethorphan-guaiFENesin (MUCINEX DM) 30-600 MG per 12 hr tablet 1 tablet, 1 tablet, Oral, BID PRN, Amin, Ankit Chirag, MD .  dextrose 50 % solution 0-50 mL, 0-50 mL, Intravenous, PRN, End, Christopher, MD .  docusate sodium (COLACE) capsule 100 mg, 100 mg, Oral, BID PRN, Amin, Ankit Chirag, MD .  enoxaparin (LOVENOX) injection 82.5 mg, 0.5 mg/kg, Subcutaneous, Q24H, Amin, Ankit Chirag, MD, 82.5 mg at 02/02/21 2150 .  famotidine (PEPCID) tablet 20 mg, 20 mg, Oral, Daily, Azucena Fallen, MD, 20 mg at 02/03/21 8119 .  Gerhardt's butt cream, , Topical, BID, Amin, Ankit Chirag, MD, Given at 02/03/21 [phone] .  hydrALAZINE (APRESOLINE) injection 10 mg, 10 mg, Intravenous, Q4H PRN, Belia Heman, Kurian, MD .  insulin aspart (novoLOG) injection 0-20 Units, 0-20 Units, Subcutaneous, TID AC & HS, Amin, Ankit Chirag, MD, 4 Units at 02/02/21 1700 .  insulin aspart (novoLOG) injection 8 Units, 8 Units, Subcutaneous, TID WC, Amin, Ankit Chirag, MD, 8 Units at 02/03/21 [phone] .  insulin detemir (LEVEMIR) injection 37 Units, 37 Units, Subcutaneous, Daily, Dimple Nanas, MD, 37 Units at 02/03/21 [phone] .  lip balm (BLISTEX) ointment 1 application, 1 application, Topical, PRN, Azucena Fallen,  MD .  living well with diabetes book MISC, , Does not apply, Once, Amin, Ankit Chirag, MD .  MEDLINE mouth rinse, 15 mL, Mouth Rinse, BID, Kasa, Kurian, MD, 15 mL at 02/03/21 0940 .  metoprolol  tartrate (LOPRESSOR) injection 5 mg, 5 mg, Intravenous, Q4H PRN, Amin, Ankit Chirag, MD .  metoprolol tartrate (LOPRESSOR) tablet 50 mg, 50 mg, Oral, BID, Amin, Ankit Chirag, MD, 50 mg at 02/03/21 7564 .  multivitamin with minerals tablet 1 tablet, 1 tablet, Oral, Daily, Azucena Fallen, MD, 1 tablet at 02/03/21 [phone] .  ondansetron (ZOFRAN) tablet 4 mg, 4 mg, Oral, Q6H PRN **OR** ondansetron (ZOFRAN) injection 4 mg, 4 mg, Intravenous, Q6H PRN, End, Christopher, MD, 4 mg at 01/27/21 2210 .  ondansetron (ZOFRAN) injection 4 mg, 4 mg, Intravenous, Q6H PRN, End, Christopher, MD, 4 mg at 01/18/21 1200 .  oxyCODONE (Oxy IR/ROXICODONE) immediate release tablet 5 mg, 5 mg, Oral, Q4H PRN, Amin, Ankit Chirag, MD, 5 mg at 02/02/21 1304 .  polyethylene glycol (MIRALAX / GLYCOLAX) packet 17 g, 17 g, Oral, Daily PRN, End, Christopher, MD .  protein supplement (ENSURE MAX) liquid, 11 oz, Oral, BID BM, Amin, Ankit Chirag, MD, 11 oz at 02/03/21 0940 .  sodium chloride flush (NS) 0.9 % injection 3 mL, 3 mL, Intravenous, Q12H, End, Christopher, MD, 3 mL at 02/03/21 0940 .  sodium chloride flush (NS) 0.9 % injection 3 mL, 3 mL, Intravenous, PRN, End, Christopher, MD, 3 mL at 02/01/21 2108  Patients Current Diet:     Diet Order                  Diet - low sodium heart healthy            Diet regular Room service appropriate? Yes; Fluid consistency: Thin  Diet effective now                  Precautions / Restrictions Precautions Precautions: Fall Restrictions Weight Bearing Restrictions: No Other Position/Activity Restrictions: Temp HD IJ cath   Has the patient had 2 or more falls or a fall with injury in the past year?No  Prior Activity Level Community (5-7x/wk): working from home prior to  admit, independent, no DME used at baseline, driving  Prior Functional Level Prior Function Level of Independence: Independent Comments: Indep with ADLs, household and community mobilization without assist device; denies fall history; no home O2; enjoys video games.  Self Care: Did the patient need help bathing, dressing, using the toilet or eating?  Independent  Indoor Mobility: Did the patient need assistance with walking from room to room (with or without device)? Independent  Stairs: Did the patient need assistance with internal or external stairs (with or without device)? Independent  Functional Cognition: Did the patient need help planning regular tasks such as shopping or remembering to take medications? Independent  Home Assistive Devices / Equipment Home Assistive Devices/Equipment: None Home Equipment: None  Prior Device Use: Indicate devices/aids used by the patient prior to current illness, exacerbation or injury? None of the above  Current Functional Level Cognition  Overall Cognitive Status: Within Functional Limits for tasks assessed Orientation Level: Oriented X4 General Comments: patient following single step commands without difficulty    Extremity Assessment (includes Sensation/Coordination)  Upper Extremity Assessment: Generalized weakness,RUE deficits/detail,LUE deficits/detail RUE Deficits / Details: R UE grossly 3-/5 LUE Deficits / Details: L UE grossly 2+/5;  Lower Extremity Assessment: Defer to PT evaluation    ADLs  Overall ADL's : Needs assistance/impaired Grooming: Wash/dry hands,Oral care,Set up,Sitting Grooming Details (indicate cue type and reason): Continues to use L UE as functional assist, however appears to have increased strength and ROM grossly Upper Body Bathing: Minimal assistance,Set up,Bed level Lower Body Bathing:  Maximal assistance,Total assistance,Bed level Lower Body Bathing Details (indicate cue type and reason): using  lateral rolling technique, one person assist for bathing task, but 2p assist to roll for task Upper Body Dressing : Minimal assistance,Bed level Upper Body Dressing Details (indicate cue type and reason): garment oriented for patient Lower Body Dressing: Maximal assistance,Total assistance,Bed level Lower Body Dressing Details (indicate cue type and reason): To don/doff socks Toileting- Clothing Manipulation and Hygiene: Maximal assistance,+2 for physical assistance,Bed level Toileting - Clothing Manipulation Details (indicate cue type and reason): pt with large liquid BM in bed despite FMS connected. Functional mobility during ADLs: Maximal assistance,+2 for physical assistance,Rolling walker (+3 for physical assistance during sit>stand; able to tolerate x4 attempts)    Mobility  Overal bed mobility: Needs Assistance Bed Mobility: Supine to Sit,Sit to Supine Rolling: Min assist (for rolling to left side for comfort due to rectal tube) Supine to sit: Max assist,HOB elevated Sit to supine: Mod assist,+2 for physical assistance General bed mobility comments: one person assistance for supine to short sitting. +2 person assistance required for return to bed. verbal cues for sequencing and technique.    Transfers  Overall transfer level: Needs assistance Equipment used: Rolling walker (2 wheeled) (bariatirc rolling walker) Transfer via Lift Equipment:  ([employer] Product/process development scientisthoyer) Transfers: Sit to/from Stand Sit to Stand: Mod assist (+3 person for safety and body habitus) Squat pivot transfers: Max assist (+3 person for safety and body habitus)  Lateral/Scoot Transfers: Mod assist,+2 physical assistance General transfer comment: patient performed 4 bouts of sit to stand transfers from bed with the bed in the lowest height. patient needs Mod- Max A +2 with lifting assistance reuqired and for faciliation for upright trunk. patient was able to stand fully for more than 10 seconds with rolling walker for UE  support. short rest breaks between bouts of standing    Ambulation / Gait / Stairs / Wheelchair Mobility  Ambulation/Gait General Gait Details: ambulation not attempted this session    Posture / Balance Dynamic Sitting Balance Sitting balance - Comments: no loss of balance in sitting position Balance Overall balance assessment: Needs assistance Sitting-balance support: Feet supported,No upper extremity supported Sitting balance-Leahy Scale: Fair Sitting balance - Comments: no loss of balance in sitting position Postural control: Posterior lean,Left lateral lean Standing balance-Leahy Scale: Zero Standing balance comment: Pt able to clear hips for partial standing x3 bouts with difficulty with trunk extension due to fatigue.    Special needs/care consideration Oxygen on room air currently, Diabetic management yes and Special service needs bariatric patient     Previous Home Environment (from acute therapy documentation) Living Arrangements: Parent Available Help at Discharge: Family Type of Home: House Home Layout: Multi-level,Bed/bath upstairs Home Care Services: No  Discharge Living Setting Plans for Discharge Living Setting: Lives with (comment),Patient's home (lives with mother) Type of Home at Discharge: Apartment Discharge Home Layout: One level (on the 3rd floor, could maybe move to 1st) Discharge Home Access: Stairs to enter Entrance Stairs-Rails: Interior and spatial designeright,Left Entrance Stairs-Number of Steps: 2 flights Discharge Bathroom Shower/Tub: Tub/shower unit Discharge Bathroom Toilet: Standard Discharge Bathroom Accessibility: Yes How Accessible: Accessible via walker (would not be accessible with bariatric w/c) Does the patient have any problems obtaining your medications?: Yes (Describe) (uninsured)  Social/Family/Support Systems Anticipated Caregiver: mom Phylis BougieLawanner Traber Anticipated Caregiver's Contact Information: [phone] Ability/Limitations of Caregiver: she is  in dialysis 3x/week but can have other family be with patient if necessary Caregiver Availability: 24/7 Discharge Plan Discussed with Primary Caregiver: Yes Is Caregiver In  Agreement with Plan?: Yes Does Caregiver/Family have Issues with Lodging/Transportation while Pt is in Rehab?: No   Goals Patient/Family Goal for Rehab: PT/OT supervision to min assist, SLP n/a Expected length of stay: 21-14 days Additional Information: bariatric patient Pt/Family Agrees to Admission and willing to participate: Yes Program Orientation Provided & Reviewed with Pt/Caregiver Including Roles  & Responsibilities: Yes  Barriers to Discharge: Inaccessible home environment,Insurance for SNF coverage  Barriers to Discharge Comments: third floor apartment   Decrease burden of Care through IP rehab admission: n/a  Possible need for SNF placement upon discharge: Not anticipated.  Discussed that pt will not be able to d/c to SNF without a payor source and mom verbalizes understanding.   Patient Condition: This patient's condition remains as documented in the consult dated 02/02/21, in which the Rehabilitation Physician determined and documented that the patient's condition is appropriate for intensive rehabilitative care in an inpatient rehabilitation facility. Will admit to inpatient rehab today.  Preadmission Screen Completed By:  Stephania Fragmin, PT, DPT 02/03/2021 11:06 AM ______________________________________________________________________   Discussed status with Dr. Allena Katz on 02/03/21 at 11:08 AM  and received approval for admission today.  Admission Coordinator:  Stephania Fragmin, PT, DPT time 11:08 AM Dorna Bloom 02/03/21          Revision History                             Note Details  Author Marcello Fennel, MD File Time 02/03/2021 11:14 AM  Author Type Physician Status Addendum  Last Editor Marcello Fennel, MD Service Physical Medicine and Rehabilitation  [facility] Acct #  [id number] Admit Date 02/03/2021

## 2021-02-03 NOTE — H&P (Signed)
Physical Medicine and Rehabilitation Admission H&P    Chief Complaint  Patient presents with  . Shortness of Breath  . Hyperglycemia  : HPI: Erik [name] is a 30 year old right-handed male with unremarkable past medical history except obesity with BMI 49.64 on no prescription medications and is not followed by a medical provider.  History taken from chart review and patient.  Patient lives with parent.  Multilevel home bed and bath upstairs.  Independent prior to admission.  He presented on 01/12/2021 with progressive weakness, poor p.o. intake, lethargy x2 weeks.  Denied any fever.  Patient with bouts of nausea vomiting.  Chest x-ray showed cardiomegaly.  No pneumothorax.  Bilateral pulmonary infiltrate/edema admission chemistry sodium 142, potassium 5.7, glucose 802 BUN 74 creatinine 3.06 total bilirubin 1.8, WBC 17,300, hemoglobin 15.8, lactic acid 3.3, blood cultures no growth to date, hemoglobin A1c 13.6 SARS coronavirus positive.  Patient was placed on intravenous remdesivir as well as Decadron.  Airborne and contact precautions implemented and discontinued on 02/01/2021 and patient currently maintained on room air.  Renal ultrasound showed no obstructive uropathy.  Patient did initially require intubation for airway protection.  Renal service was consulted and CRRT initiated.  Hospital course further complicated by increasing chest pain on 01/19/2021.  EKG SHOWING MI/STEMI as well as episode of sustained ventricular tachycardia taken to the cardiac Cath Lab on 01/20/2021 that showed no significant CAD suspect myocarditis and continues to be followed by Dr. Cristal De[place] End.  Echocardiogram with ejection fraction of 60-65%.  Patient was placed on metoprolol as well as low-dose aspirin and Plavix.  Lovenox ongoing for DVT prophylaxis.  Insulin initiate as well as follow-up diabetic coordinator for diabetes mellitus.  AKI has resolved CRRT discontinued latest creatinine 1.69.  He is tolerating a regular  diet.  Therapy evaluations completed due to patient decreased functional mobility and endurance and was admitted for a comprehensive rehab program.  Please see preadmission assessment from earlier today as well.  Review of Systems  Constitutional: Positive for malaise/fatigue. Negative for chills and fever.  HENT: Negative for hearing loss.   Eyes: Negative for blurred vision and double vision.  Respiratory: Positive for cough and shortness of breath. Negative for wheezing.   Cardiovascular: Negative for palpitations.  Gastrointestinal: Positive for constipation. Negative for heartburn, nausea and vomiting.  Genitourinary: Negative for dysuria, flank pain and hematuria.  Musculoskeletal: Positive for myalgias.  Skin: Negative for rash.  Neurological: Positive for weakness. Negative for speech change and focal weakness.  All other systems reviewed and are negative.  Past Medical History:  Diagnosis Date  . Asthma    Past Surgical History:  Procedure Laterality Date  . LEFT HEART CATH AND CORONARY ANGIOGRAPHY N/A 01/20/2021   Procedure: LEFT HEART CATH AND CORONARY ANGIOGRAPHY;  Surgeon: Yvonne Kendall, MD;  Location: [facility] INVASIVE CV LAB;  Service: Cardiovascular;  Laterality: N/A;   No family history on file. Social History:  reports that he has never smoked. He has never used smokeless tobacco. He reports that he does not drink alcohol and does not use drugs. Allergies: No Known Allergies No medications prior to admission.    Drug Regimen Review Drug regimen was reviewed and remains appropriate with no significant issues identified  Home: Home Living Family/patient expects to be discharged to:: Private residence Living Arrangements: Parent Available Help at Discharge: Family Type of Home: House Home Layout: Multi-level,Bed/bath upstairs Home Equipment: None   Functional History: Prior Function Level of Independence: Independent Comments: Indep with ADLs, household  and  community mobilization without assist device; denies fall history; no home O2; enjoys video games.  Functional Status:  Mobility: Bed Mobility Overal bed mobility: Needs Assistance Bed Mobility: Supine to Sit,Sit to Supine Rolling: Min assist (for rolling to left side for comfort due to rectal tube) Supine to sit: Max assist,HOB elevated Sit to supine: Mod assist,+2 for physical assistance General bed mobility comments: one person assistance for supine to short sitting. +2 person assistance required for return to bed. verbal cues for sequencing and technique. Transfers Overall transfer level: Needs assistance Equipment used: Rolling walker (2 wheeled) (bariatirc rolling walker) Transfer via Lift Equipment:  ([employer]/process development scientist) Transfers: Sit to/from Stand Sit to Stand: Mod assist (+3 person for safety and body habitus) Squat pivot transfers: Max assist (+3 person for safety and body habitus)  Lateral/Scoot Transfers: Mod assist,+2 physical assistance General transfer comment: patient performed 4 bouts of sit to stand transfers from bed with the bed in the lowest height. patient needs Mod- Max A +2 with lifting assistance reuqired and for faciliation for upright trunk. patient was able to stand fully for more than 10 seconds with rolling walker for UE support. short rest breaks between bouts of standing Ambulation/Gait General Gait Details: ambulation not attempted this session    ADL: ADL Overall ADL's : Needs assistance/impaired Grooming: Wash/dry hands,Oral care,Set up,Sitting Grooming Details (indicate cue type and reason): Continues to use L UE as functional assist, however appears to have increased strength and ROM grossly Upper Body Bathing: Minimal assistance,Set up,Bed level Lower Body Bathing: Maximal assistance,Total assistance,Bed level Lower Body Bathing Details (indicate cue type and reason): using lateral rolling technique, one person assist for bathing task, but 2p assist to  roll for task Upper Body Dressing : Minimal assistance,Bed level Upper Body Dressing Details (indicate cue type and reason): garment oriented for patient Lower Body Dressing: Maximal assistance,Total assistance,Bed level Lower Body Dressing Details (indicate cue type and reason): To don/doff socks Toileting- Clothing Manipulation and Hygiene: Maximal assistance,+2 for physical assistance,Bed level Toileting - Clothing Manipulation Details (indicate cue type and reason): pt with large liquid BM in bed despite FMS connected. Functional mobility during ADLs: Maximal assistance,+2 for physical assistance,Rolling walker (+3 for physical assistance during sit>stand; able to tolerate x4 attempts)  Cognition: Cognition Overall Cognitive Status: Within Functional Limits for tasks assessed Orientation Level: Oriented X4 Cognition Arousal/Alertness: Awake/alert Behavior During Therapy: WFL for tasks assessed/performed Overall Cognitive Status: Within Functional Limits for tasks assessed General Comments: patient following single step commands without difficulty  Physical Exam: Blood pressure 140/89, pulse (!) 110, temperature 97.9 F (36.6 C), resp. rate 18, height 6' (1.829 m), weight (!) 166 kg, SpO2 98 %. Physical Exam Vitals reviewed.  Constitutional:      Appearance: He is obese.  HENT:     Head: Normocephalic and atraumatic.     Right Ear: External ear normal.     Left Ear: External ear normal.     Nose: Nose normal.  Eyes:     General:        Right eye: No discharge.        Left eye: No discharge.     Extraocular Movements: Extraocular movements intact.  Cardiovascular:     Rate and Rhythm: Regular rhythm. Tachycardia present.  Pulmonary:     Effort: Pulmonary effort is normal. No respiratory distress.     Breath sounds: No stridor.  Abdominal:     General: Abdomen is flat. Bowel sounds are normal. There is no distension.  Musculoskeletal:     Cervical back: Normal range of  motion and neck supple.     Comments: No edema or tenderness in extremities  Skin:    General: Skin is warm and dry.  Neurological:     Mental Status: He is alert.     Comments: Alert and oriented x3 and follows commands. Motor: 4/5 throughout  Psychiatric:        Mood and Affect: Affect is blunt.        Behavior: Behavior is slowed.     Results for orders placed or performed during the hospital encounter of 01/12/21 (from the past 48 hour(s))  Glucose, capillary     Status: Abnormal   Collection Time: 02/01/21 12:14 PM  Result Value Ref Range   Glucose-Capillary 189 (H) 70 - 99 mg/dL    Comment: Glucose reference range applies only to samples taken after fasting for at least 8 hours.  Glucose, capillary     Status: Abnormal   Collection Time: 02/01/21  4:44 PM  Result Value Ref Range   Glucose-Capillary 262 (H) 70 - 99 mg/dL    Comment: Glucose reference range applies only to samples taken after fasting for at least 8 hours.  Glucose, capillary     Status: Abnormal   Collection Time: 02/01/21  8:23 PM  Result Value Ref Range   Glucose-Capillary 232 (H) 70 - 99 mg/dL    Comment: Glucose reference range applies only to samples taken after fasting for at least 8 hours.  Basic metabolic panel     Status: Abnormal   Collection Time: 02/02/21  4:09 AM  Result Value Ref Range   Sodium 140 135 - 145 mmol/L   Potassium 4.0 3.5 - 5.1 mmol/L   Chloride 107 98 - 111 mmol/L   CO2 25 22 - 32 mmol/L   Glucose, Bld 124 (H) 70 - 99 mg/dL    Comment: Glucose reference range applies only to samples taken after fasting for at least 8 hours.   BUN 26 (H) 6 - 20 mg/dL   Creatinine, Ser 9.15 (H) 0.61 - 1.24 mg/dL   Calcium 8.8 (L) 8.9 - 10.3 mg/dL   GFR, Estimated 58 (L) >60 mL/min    Comment: (NOTE) Calculated using the CKD-EPI Creatinine Equation (2021)    Anion gap 8 5 - 15    Comment: Performed at [facility], [address] [place], [place] [address]  Magnesium     Status:  None   Collection Time: 02/02/21  4:09 AM  Result Value Ref Range   Magnesium 1.9 1.7 - 2.4 mg/dL    Comment: Performed at [facility], [address] [place], [place] [address]  Glucose, capillary     Status: Abnormal   Collection Time: 02/02/21  8:37 AM  Result Value Ref Range   Glucose-Capillary 111 (H) 70 - 99 mg/dL    Comment: Glucose reference range applies only to samples taken after fasting for at least 8 hours.  Glucose, capillary     Status: Abnormal   Collection Time: 02/02/21 11:27 AM  Result Value Ref Range   Glucose-Capillary 178 (H) 70 - 99 mg/dL    Comment: Glucose reference range applies only to samples taken after fasting for at least 8 hours.  Glucose, capillary     Status: Abnormal   Collection Time: 02/02/21  4:25 PM  Result Value Ref Range   Glucose-Capillary 151 (H) 70 - 99 mg/dL    Comment: Glucose reference range applies  only to samples taken after fasting for at least 8 hours.  Glucose, capillary     Status: Abnormal   Collection Time: 02/02/21  9:23 PM  Result Value Ref Range   Glucose-Capillary 106 (H) 70 - 99 mg/dL    Comment: Glucose reference range applies only to samples taken after fasting for at least 8 hours.  Basic metabolic panel     Status: Abnormal   Collection Time: 02/03/21  4:26 AM  Result Value Ref Range   Sodium 139 135 - 145 mmol/L   Potassium 3.8 3.5 - 5.1 mmol/L   Chloride 108 98 - 111 mmol/L   CO2 23 22 - 32 mmol/L   Glucose, Bld 111 (H) 70 - 99 mg/dL    Comment: Glucose reference range applies only to samples taken after fasting for at least 8 hours.   BUN 27 (H) 6 - 20 mg/dL   Creatinine, Ser 1.611.69 (H) 0.61 - 1.24 mg/dL   Calcium 8.5 (L) 8.9 - 10.3 mg/dL   GFR, Estimated 56 (L) >60 mL/min    Comment: (NOTE) Calculated using the CKD-EPI Creatinine Equation (2021)    Anion gap 8 5 - 15    Comment: Performed at [facility], [address] [place], [place] [address]  Magnesium     Status: None    Collection Time: 02/03/21  4:26 AM  Result Value Ref Range   Magnesium 1.7 1.7 - 2.4 mg/dL    Comment: Performed at [facility], [address] [place], [place] [address]  Glucose, capillary     Status: Abnormal   Collection Time: 02/03/21  8:03 AM  Result Value Ref Range   Glucose-Capillary 114 (H) 70 - 99 mg/dL    Comment: Glucose reference range applies only to samples taken after fasting for at least 8 hours.   No results found.     Medical Problem List and Plan: 1.  Debility secondary to COVID-19 with course complicated by MI/STEMI as well as AKI  -patient may shower  -ELOS/Goals: 14-17 days/supervision/min a  Admit to CIR 2.  Antithrombotics: -DVT/anticoagulation: Lovenox 82.5 mg daily  -antiplatelet therapy: Aspirin 81 mg daily and Plavix 85 mg daily 3. Pain Management: Oxycodone as needed  By with increased exertion 4. Mood: Provide emotional support  -antipsychotic agents: N/A 5. Neuropsych: This patient is capable of making decisions on his own behalf. 6. Skin/Wound Care: Routine skin checks 7. Fluids/Electrolytes/Nutrition: Routine in and outs 8.  AKI.  Renal function improved.  CRRT discontinued.  Follow-up renal services  CMP ordered for tomorrow. 9.  COVID-19.  Patient completed course of remdesivir as well as steroids.  Contact precautions discontinued 10.  Super obesity.  BMI 53.52 dietary follow-up 11.  Newly diagnosed uncontrolled diabetes mellitus.  Hemoglobin A1c 13.6.  Continue NovoLog 8 units 3 times daily, Levemir 37 units daily  Monitor with increased mobility 12.  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 tach.  Cardiology follow-up.  Cardiac catheterization 01/20/2021 no significant CAD.    Metoprolol 50 mg twice daily. 13.  Hyperlipidemia: Lipitor  Charlton AmorDaniel J Angiulli, PA-C 02/03/2021  I have personally performed a face to face diagnostic evaluation, including, but not limited to relevant history and physical exam  findings, of this patient and developed relevant assessment and plan.  Additionally, I have reviewed and concur with the physician assistant's documentation above.  Maryla MorrowAnkit Jakobee Brackins, MD, ABPMR

## 2021-02-03 NOTE — Discharge Summary (Addendum)
Physician Discharge Summary  °Erik [name] MRN:[id number] DOB: 11/21/1991 DOA: 01/12/2021 ° °PCP: Pcp, No ° °Admit date: 01/12/2021 °Discharge date: 02/03/2021 ° °Admitted From: Home °Disposition:  CIR ° °Recommendations for Outpatient Follow-up:  °1. Follow up with PCP in 1-2 weeks °2. Please obtain BMP/CBC in one week °3. Please follow up on the following pending results:None ° °Home Health:No °Equipment/Devices: °Discharge Condition: Stable °CODE STATUS: Full °Diet recommendation: Heart Healthy / Carb Modified  ° °Brief/Interim Summary: °29 yo morbidly obese male with Asthma now with progressive multiorgan failure with COVID 19 pneumonia and ARDS with severe acidosis and severe hypoxia with ARDS with acute renal failure; initially intubated and required CRRT.  Later extubated and renal functions maintained without any replacement therapy.  Received treatment with remdesivir and steroid.  He was not a candidate for baricitinib or Actemra. ° °Patient met severe sepsis with septic shock criteria secondary to COVID-19 pneumonia on admission. °Sepsis treated and resolved before discharge. ° °On 01/19/2021 suffered from acute STEMI and was taken for cardiac catheterization on 01/20/2021 which was negative for any significant coronary artery disease.  Suspecting myocarditis.  Echocardiogram with normal EF. ° °Patient was also found to have hyperglycemia, secondary to newly diagnosed uncontrolled diabetes with A1c of 11.8.  He was started on Levemir and NovoLog and will follow up with his primary care provider for further recommendations on discharge. ° °Patient developed ambulatory dysfunction and profound deconditioning secondary to acute illness.  Evaluated by PT and OT and they were recommending CIR.  Patient was discharged to CIR for rehab. ° °Discharge Diagnoses:  °Principal Problem: °  Acute hypoxemic respiratory failure due to COVID-19 (HCC) °Active Problems: °  DKA (diabetic ketoacidosis) (HCC) °  Obesity, Class III, BMI  40-49.9 (morbid obesity) (HCC) °  Acute metabolic encephalopathy °  ARDS (adult respiratory distress syndrome) (HCC) °  ST elevation myocardial infarction (STEMI) (HCC) °  Acute respiratory failure with hypoxia (HCC) °  VT (ventricular tachycardia) (HCC) °  Elevated troponin ° °Discharge Instructions ° °Discharge Instructions   ° Ambulatory referral to Nutrition and Diabetic Education   Complete by: As directed °  ° Admit with COVID and DKA/ New Diagnosis of Diabetes.  Pt to go to inpatient Rehab after discharge.  Not sure what timeline is yet for discharge to home from Rehab.  Will need insulin for home.  ° Diet - low sodium heart healthy   Complete by: As directed °  ° Increase activity slowly   Complete by: As directed °  ° No dressing needed   Complete by: As directed °  °  ° °Allergies as of 02/03/2021   °No Known Allergies °  °  °Medication List  °  °TAKE these medications   °albuterol 108 (90 Base) MCG/ACT inhaler °Commonly known as: VENTOLIN HFA °Inhale 1-2 puffs into the lungs every 6 (six) hours. °  °aspirin 81 MG chewable tablet °Chew 1 tablet (81 mg total) by mouth daily. °Start taking on: February 04, 2021 °  °atorvastatin 80 MG tablet °Commonly known as: LIPITOR °Take 1 tablet (80 mg total) by mouth daily. °Start taking on: February 04, 2021 °  °clopidogrel 75 MG tablet °Commonly known as: PLAVIX °Take 1 tablet (75 mg total) by mouth daily. °Start taking on: February 04, 2021 °  °dextromethorphan-guaiFENesin 30-600 MG 12hr tablet °Commonly known as: MUCINEX DM °Take 1 tablet by mouth 2 (two) times daily as needed for cough. °  °docusate sodium 100 MG capsule °Commonly known as: COLACE °Take   1 capsule (100 mg total) by mouth 2 (two) times daily as needed for mild constipation. °  °Ensure Max Protein Liqd °Take 330 mLs (11 oz total) by mouth 2 (two) times daily between meals. °  °famotidine 20 MG tablet °Commonly known as: PEPCID °Take 1 tablet (20 mg total) by mouth daily. °Start taking on: February 04, 2021 °  °insulin aspart 100 UNIT/ML injection °Commonly known as: novoLOG °Inject 0-20 Units into the skin 4 (four) times daily -  before meals and at bedtime. °  °insulin aspart 100 UNIT/ML injection °Commonly known as: novoLOG °Inject 8 Units into the skin 3 (three) times daily with meals. °  °insulin detemir 100 UNIT/ML injection °Commonly known as: LEVEMIR °Inject 0.37 mLs (37 Units total) into the skin daily. °Start taking on: February 04, 2021 °  °metoprolol tartrate 50 MG tablet °Commonly known as: LOPRESSOR °Take 1 tablet (50 mg total) by mouth 2 (two) times daily. °  °multivitamin with minerals Tabs tablet °Take 1 tablet by mouth daily. °Start taking on: February 04, 2021 °  °ondansetron 4 MG tablet °Commonly known as: ZOFRAN °Take 1 tablet (4 mg total) by mouth every 6 (six) hours as needed for nausea. °  °polyethylene glycol 17 g packet °Commonly known as: MIRALAX / GLYCOLAX °Take 17 g by mouth daily as needed for moderate constipation. °  °  °  °  ° °  °Discharge Care Instructions  °(From admission, onward)  °  ° °  °  Start     Ordered  ° 02/03/21 0000  No dressing needed       ° 02/03/21 1028  °  °  °  ° ° °No Known Allergies ° °Consultations: °· Cardiology °· PCCM °· Nephrology ° °Procedures/Studies: °DG Abd 1 View ° °Result Date: 01/21/2021 °CLINICAL DATA:  29-year-old male positive COVID-19. Decreased bowel sounds. EXAM: ABDOMEN - 1 VIEW COMPARISON:  01/19/2021 and earlier. FINDINGS: Portable AP supine view at 0626 hours. Stable enteric tube, tip at the distal stomach. Right femoral approach catheter appears stable. Continued largely absent bowel gas throughout the abdomen and pelvis. Trace gas in the region of the rectum. No dilated bowel loops are evident. Stable visualized osseous structures. IMPRESSION: 1. Stable enteric tube and right femoral catheter. 2. Continued virtual absence of bowel gas throughout the abdomen and pelvis. No dilated loops are evident, although if there is new abdominal  distension then recommend follow-up CT Abdomen and Pelvis as fluid-filled abnormal bowel is difficult to exclude. Electronically Signed   By: H  Hall M.D.   On: 01/21/2021 06:49  ° °DG Abd 1 View ° °Result Date: 01/19/2021 °CLINICAL DATA:  Ileus.  Reported COVID-19 pneumonitis EXAM: ABDOMEN - 1 VIEW COMPARISON:  January 13, 2021. FINDINGS: The nasogastric tube tip in region of pylorus. Right femoral catheter tip is at the L4 level. Apparent rectal thermometer present. There is a relative paucity of bowel gas. No bowel dilatation or air-fluid levels. No free air evident on supine examination. Airspace opacity left base, incompletely visualized. IMPRESSION: Nasogastric tube tip at gastric pylorus level. Paucity of bowel gas may be indicative of ileus or enteritis. Bowel obstruction felt to be less likely. No free air evident. Electronically Signed   By: William  Woodruff III M.D.   On: 01/19/2021 11:31  ° °DG Abd 1 View ° °Result Date: 01/13/2021 °CLINICAL DATA:  Orogastric tube placement EXAM: ABDOMEN - 1 VIEW COMPARISON:  01/12/2021 FINDINGS: Limited view of the lower chest and upper abdomen   was performed for the purposes of enteric tube localization. Enteric tube courses below the diaphragm with distal tip extending into the distal aspect of the gastric body. Multiple overlying cardiac leads. Cardiomegaly. IMPRESSION: Enteric tube courses below the diaphragm with distal tip extending into the distal aspect of the gastric body. Electronically Signed   By: Nicholas  Plundo D.O.   On: 01/13/2021 11:31  ° °DG Abdomen 1 View ° °Result Date: 01/12/2021 °CLINICAL DATA:  NG tube placement. EXAM: ABDOMEN - 1 VIEW COMPARISON:  None. FINDINGS: Tip of the enteric tube is below the diaphragm in the stomach, the side port is in the distal esophagus. Advancement of at least 7 cm is recommended for optimal placement. Normal bowel gas pattern in the included upper abdomen. IMPRESSION: Tip of the enteric tube below the diaphragm in the  stomach, the side port in the distal esophagus. Advancement of at least 7 cm recommended for optimal placement. Electronically Signed   By: Melanie  Sanford M.D.   On: 01/12/2021 18:03  ° °CARDIAC CATHETERIZATION ° °Result Date: 01/20/2021 °Conclusions: 1. Minimal coronary artery plaquing without angiographically significant coronary artery disease. 2. Moderately reduced left ventricular contraction with global hypokinesis. 3. Mildly elevated left ventricular filling pressure (LVEDP ~20 mmHg). Recommendations: 1. No obvious coronary artery lesion to explain ST segment changes and sustained ventricular tachycardia.  Possible causes include metabolic/electrolyte derangements and myocarditis in the setting of COVID-19. 2. Trend HS-TnI until it has peaked, then stop. 3. Obtain transthoracic echocardiogram. 4. If blood pressure tolerates, consider addition of low-dose beta-blocker. 5. If patient has recurrent VT, consider IV amiodarone. 6. Replete electrolytes to maintain K > 4.0 and Mg > 2.0. Christopher End, MD [facility]  ° °US RENAL ° °Result Date: 01/13/2021 °CLINICAL DATA:  Acute kidney injury. EXAM: RENAL / URINARY TRACT ULTRASOUND COMPLETE COMPARISON:  None. FINDINGS: Right Kidney: Renal measurements: 13.7 x 6.6 x 7.6 cm = volume: 367 mL. No hydronephrosis. Grossly normal renal parenchymal echogenicity. Normal renal cortical thickness. No obvious focal renal lesion. Left Kidney: Renal measurements: 12.6 x 7.0 x 6.3 cm = volume: 290 mL. No hydronephrosis. Grossly normal renal parenchymal echogenicity. Normal renal cortical thickness. No obvious focal lesion. Bladder: Decompressed by Foley catheter. Other: Incidental note of hepatic steatosis. Technically limited exam due to patient body habitus and medical status on ventilator. IMPRESSION: Unremarkable renal ultrasound.  No obstructive uropathy. Electronically Signed   By: Melanie  Sanford M.D.   On: 01/13/2021 19:30  ° °DG Chest Port 1 View ° °Result Date:  01/19/2021 °CLINICAL DATA:  Acute respiratory failure EXAM: PORTABLE CHEST 1 VIEW COMPARISON:  01/15/2021 FINDINGS: The endotracheal tube terminates above the carina. The left-sided dialysis catheter is well position. The enteric tube terminates below the left hemidiaphragm. The lung volumes are low. There is no pneumothorax. There are findings of volume overload/vascular congestion. There is bibasilar atelectasis with probable small bilateral pleural effusions. IMPRESSION: Lines and tubes as above. Low lung volumes with vascular congestion and bibasilar atelectasis. Electronically Signed   By: Christopher  Green M.D.   On: 01/19/2021 01:36  ° °DG Chest Port 1 View ° °Result Date: 01/15/2021 °CLINICAL DATA:  Central line placement EXAM: PORTABLE CHEST 1 VIEW COMPARISON:  Radiograph 01/14/2021 FINDINGS: Endotracheal tube tip terminates 4.1 cm from the carina. Transesophageal tube tip and side port terminate below the GE junction, beyond the margins of imaging. Dual lumen left IJ approach central venous catheter tip terminates within the right atrium. Telemetry leads and external devices overlie the chest. Patchy   heterogeneous opacities are present in both lungs, with a mid to lower lung predominance. No pneumothorax. No effusion. Stable cardiomediastinal contours. No acute osseous or soft tissue abnormality. IMPRESSION: 1. Endotracheal tube tip terminates 4.1 cm from the carina. 2. Transesophageal tube tip and side port terminate below the GE junction, beyond the margins of imaging. 3. Persistent heterogeneous bilateral opacities, compatible with COVID-19 pneumonia. Electronically Signed   By: Lovena Le M.D.   On: 01/15/2021 06:37   DG Chest Port 1 View  Result Date: 01/14/2021 CLINICAL DATA:  Respiratory failure.  COVID pneumonia. EXAM: PORTABLE CHEST 1 VIEW COMPARISON:  Radiograph yesterday. FINDINGS: Endotracheal tube tip 3.9 cm from the carina. Enteric tube in place with tip below the diaphragm not included in  the field of view. Previous right internal jugular central line is not definitively seen. Improved retrocardiac opacity from prior exam. Background patchy heterogeneous opacities persist, not significantly changed. Stable heart size and mediastinal contours. No pneumothorax or large pleural effusion. IMPRESSION: 1. Improved retrocardiac opacity. Additional heterogeneous bilateral lung opacities are not significantly changed, typical of COVID pneumonia. 2. Endotracheal tube tip 3.9 cm from the carina. Enteric tube in place. Electronically Signed   By: Keith Rake M.D.   On: 01/14/2021 17:40   DG Chest Port 1 View  Result Date: 01/12/2021 CLINICAL DATA:  Central line placement. EXAM: PORTABLE CHEST 1 VIEW COMPARISON:  Earlier today. FINDINGS: Right-sided central line tip projects over the mid SVC. No pneumothorax. Endotracheal tube appears low in positioning 9 mm from the carina. Enteric tube in place with side-port now in the stomach below the diaphragm. Persistent volume loss at the left lung base with dense airspace opacity. Patchy opacity in the left perihilar region and right mid lung are similar. IMPRESSION: 1. Tip of the right central line projects over the mid SVC. No pneumothorax. 2. Endotracheal tube appears low in positioning 9 mm from the carina. Recommend retraction of 1-2 cm. 3. Enteric tube in place with side-port now in the stomach below the diaphragm. 4. Dense retrocardiac opacity, similar to prior. Additional patchy opacities throughout both lungs not significantly changed. Electronically Signed   By: Keith Rake M.D.   On: 01/12/2021 23:36   DG Chest Portable 1 View  Result Date: 01/12/2021 CLINICAL DATA:  Intubation. EXAM: PORTABLE CHEST 1 VIEW COMPARISON:  Chest radiograph earlier today. FINDINGS: Endotracheal tube tip is 2.4 cm from the carina. Enteric tube is in place tip below the diaphragm, side-port in the distal esophagus, this is been slightly advanced on subsequent  abdominal radiograph. Patient is rotated. Increasing volume loss and opacity at the left lung base. Streaky opacities in the right lower lung zone. Heart size grossly normal, not well assessed. No pneumothorax. Limited assessment for pleural effusion on the current exam. IMPRESSION: 1. Endotracheal tube tip 2.4 cm from the carina. 2. Enteric tube tip below the diaphragm, side-port in the distal esophagus. 3. Increasing volume loss and opacity at the left lung base, likely atelectasis and or lobar collapse/mucous plugging. Progressive is also considered. Streaky opacities in the right lower lung zone may be atelectasis or pneumonia. Electronically Signed   By: Keith Rake M.D.   On: 01/12/2021 18:02   DG Chest Portable 1 View  Result Date: 01/12/2021 CLINICAL DATA:  Shortness of breath. EXAM: PORTABLE CHEST 1 VIEW COMPARISON:  No prior. FINDINGS: Cardiomegaly. Low lung volumes. Bilateral pulmonary infiltrates/edema. No prominent pleural effusion. No pneumothorax. No acute bony abnormality. IMPRESSION: 1. Cardiomegaly. 2. Low lung volumes. Bilateral pulmonary  infiltrates/edema. Electronically Signed   By: Marcello Moores  Register   On: 01/12/2021 09:16   ECHOCARDIOGRAM COMPLETE  Result Date: 01/20/2021    ECHOCARDIOGRAM REPORT   Patient Name:   MANASSEH PITTSLEY Date of Exam: 01/20/2021 Medical Rec #:  [id number]    Height:       72.0 in Accession #:    [id number]   Weight:       425.0 lb Date of Birth:  06/21/1991    BSA:          2.935 m Patient Age:    29 years     BP:           99/52 mmHg Patient Gender: M            HR:           80 bpm. Exam Location:  [facility] Procedure: 2D Echo, Color Doppler and Cardiac Doppler Indications:    I47.2 Ventricular Tachycardia  History:        Patient has no prior history of Echocardiogram examinations.                 Asthma                 Pt tested positive for COVID-19 on 01/12/21.  Sonographer:    Charmayne Sheer RDCS (AE) Referring Phys: [id number] CHRISTOPHER END Diagnosing      Kate Sable MD Phys:  Sonographer Comments: Echo performed with patient supine and on artificial respirator and suboptimal apical window. Image acquisition challenging due to patient body habitus. IMPRESSIONS  1. Left ventricular ejection fraction, by estimation, is 60 to 65%. The left ventricle has normal function. The left ventricle has no regional wall motion abnormalities. Left ventricular diastolic parameters were normal.  2. Right ventricular systolic function is normal. The right ventricular size is normal.  3. The mitral valve is normal in structure. No evidence of mitral valve regurgitation. No evidence of mitral stenosis.  4. The aortic valve is normal in structure. Aortic valve regurgitation is not visualized. No aortic stenosis is present.  5. The inferior vena cava is normal in size with greater than 50% respiratory variability, suggesting right atrial pressure of 3 mmHg. FINDINGS  Left Ventricle: Left ventricular ejection fraction, by estimation, is 60 to 65%. The left ventricle has normal function. The left ventricle has no regional wall motion abnormalities. The left ventricular internal cavity size was normal in size. There is  no left ventricular hypertrophy. Left ventricular diastolic parameters were normal. Right Ventricle: The right ventricular size is normal. No increase in right ventricular wall thickness. Right ventricular systolic function is normal. Left Atrium: Left atrial size was normal in size. Right Atrium: Right atrial size was normal in size. Pericardium: There is no evidence of pericardial effusion. Mitral Valve: The mitral valve is normal in structure. No evidence of mitral valve regurgitation. No evidence of mitral valve stenosis. MV peak gradient, 4.2 mmHg. The mean mitral valve gradient is 2.0 mmHg. Tricuspid Valve: The tricuspid valve is normal in structure. Tricuspid valve regurgitation is not demonstrated. No evidence of tricuspid stenosis. Aortic Valve: The aortic valve is  normal in structure. Aortic valve regurgitation is not visualized. No aortic stenosis is present. Aortic valve mean gradient measures 4.0 mmHg. Aortic valve peak gradient measures 7.7 mmHg. Aortic valve area, by VTI measures 3.59 cm. Pulmonic Valve: The pulmonic valve was normal in structure. Pulmonic valve regurgitation is not visualized. No evidence of pulmonic stenosis. Aorta: The aortic  root is normal in size and structure. Venous: The inferior vena cava is normal in size with greater than 50% respiratory variability, suggesting right atrial pressure of 3 mmHg. IAS/Shunts: No atrial level shunt detected by color flow Doppler.  LEFT VENTRICLE PLAX 2D LVIDd:         5.10 cm  Diastology LVIDs:         2.90 cm  LV e' medial:    6.53 cm/s LV PW:         1.00 cm  LV E/e' medial:  14.0 LV IVS:        0.90 cm  LV e' lateral:   6.74 cm/s LVOT diam:     2.30 cm  LV E/e' lateral: 13.6 LV SV:         76 LV SV Index:   26 LVOT Area:     4.15 cm²  LEFT ATRIUM           Index LA diam:      4.10 cm 1.40 cm/m² LA Vol (A4C): 62.0 ml 21.12 ml/m²  AORTIC VALVE                   PULMONIC VALVE AV Area (Vmax):    3.23 cm²    PV Vmax:       1.54 m/s AV Area (Vmean):   3.44 cm²    PV Vmean:      88.800 cm/s AV Area (VTI):     3.59 cm²    PV VTI:        0.259 m AV Vmax:           139.00 cm/s PV Peak grad:  9.5 mmHg AV Vmean:          93.100 cm/s PV Mean grad:  4.0 mmHg AV VTI:            0.213 m AV Peak Grad:      7.7 mmHg AV Mean Grad:      4.0 mmHg LVOT Vmax:         108.00 cm/s LVOT Vmean:        77.100 cm/s LVOT VTI:          0.184 m LVOT/AV VTI ratio: 0.86  AORTA Ao Root diam: 3.10 cm MITRAL VALVE MV Area (PHT): 3.56 cm²    SHUNTS MV Area VTI:   2.67 cm²    Systemic VTI:  0.18 m MV Peak grad:  4.2 mmHg    Systemic Diam: 2.30 cm MV Mean grad:  2.0 mmHg MV Vmax:       1.02 m/s MV Vmean:      58.2 cm/s MV Decel Time: 213 msec MV E velocity: 91.70 cm/s MV A velocity: 39.80 cm/s MV E/A ratio:  2.30 Brian Agbor-Etang MD Electronically  signed by Brian Agbor-Etang MD Signature Date/Time: 01/20/2021/5:48:37 PM    Final   ° ° °Subjective: ° pt. Was seen and examined, no new complaints.  °Ready to start Rehab with [facility] ° °Discharge Exam: °Vitals:  ° 02/03/21 0437 02/03/21 0724  °BP: (!) 139/95 140/89  °Pulse: (!) 109 (!) 110  °Resp: 18 18  °Temp: 99.1 °F (37.3 °C) 97.9 °F (36.6 °C)  °SpO2: 100% 98%  ° °Vitals:  ° 02/03/21 0037 02/03/21 0437 02/03/21 0500 02/03/21 0724  °BP: (!) 140/94 (!) 139/95  140/89  °Pulse: (!) 102 (!) 109  (!) 110  °Resp: 20 18  18  °Temp: 98.7 °F (37.1 °C) 99.1 °F (37.3 °C)    97.9 F (36.6 C)  TempSrc:  Oral    SpO2: 98% 100%  98%  Weight:   (!) 166 kg   Height:        General: Pt is morbidly obese, alert, awake, not in acute distress Cardiovascular: RRR, S1/S2 +, no rubs, no gallops Respiratory: CTA bilaterally, no wheezing, no rhonchi Abdominal: Soft, NT, ND, bowel sounds + Extremities: no edema, no cyanosis   The results of significant diagnostics from this hospitalization (including imaging, microbiology, ancillary and laboratory) are listed below for reference.    Microbiology: No results found for this or any previous visit (from the past 240 hour(s)).   Labs: BNP (last 3 results) Recent Labs    01/17/21 1536 01/28/21 1050  BNP 60.4 12.7   Basic Metabolic Panel: Recent Labs  Lab 01/29/21 0515 01/30/21 0414 01/31/21 0402 02/01/21 0429 02/02/21 0409 02/03/21 0426  NA 151* 145  --  139 140 139  K 4.1 4.2  --  4.1 4.0 3.8  CL 119* 113*  --  107 107 108  CO2 24 26  --  _0 GLUCOSE 135* 144*  --  146* 124* 111*  BUN 33* 29*  --  25* 26* 27*  CREATININE 2.15* 1.96*  --  1.73* 1.63* 1.69*  CALCIUM 9.1 8.7*  --  8.5* 8.8* 8.5*  MG 1.9 1.6* 2.1 2.0 1.9 1.7   Liver Function Tests: Recent Labs  Lab 01/28/21 1050 01/29/21 0515 01/30/21 0414  AST 29 14* 13*  ALT _1 ALKPHOS 70 64 58  BILITOT 0.4 0.5 0.5  PROT 6.9 6.1* 6.3*  ALBUMIN 2.6* 2.4* 2.7*   No results for  input(s): LIPASE, AMYLASE in the last 168 hours. No results for input(s): AMMONIA in the last 168 hours. CBC: Recent Labs  Lab 01/27/21 1105 01/28/21 1050 01/29/21 0515 01/30/21 0414 01/31/21 0402  WBC 8.8 9.6 9.0 8.1 9.4  NEUTROABS 6.2 8.0* 5.8 4.9 5.9  HGB 9.1* 9.6* 8.9* 9.2* 8.3*  HCT 30.7* 33.5* 30.9* 30.3* 26.9*  MCV 88.5 88.6 89.3 87.8 85.7  PLT 350 337 310 268 256   Cardiac Enzymes: No results for input(s): CKTOTAL, CKMB, CKMBINDEX, TROPONINI in the last 168 hours. BNP: Invalid input(s): POCBNP CBG: Recent Labs  Lab 02/02/21 0837 02/02/21 1127 02/02/21 1625 02/02/21 2123 02/03/21 0803  GLUCAP 111* 178* 151* 106* 114*   D-Dimer No results for input(s): DDIMER in the last 72 hours. Hgb A1c No results for input(s): HGBA1C in the last 72 hours. Lipid Profile No results for input(s): CHOL, HDL, LDLCALC, TRIG, CHOLHDL, LDLDIRECT in the last 72 hours. Thyroid function studies No results for input(s): TSH, T4TOTAL, T3FREE, THYROIDAB in the last 72 hours.  Invalid input(s): FREET3 Anemia work up No results for input(s): VITAMINB12, FOLATE, FERRITIN, TIBC, IRON, RETICCTPCT in the last 72 hours. Urinalysis    Component Value Date/Time   COLORURINE AMBER (A) 01/13/2021 1046   APPEARANCEUR TURBID (A) 01/13/2021 1046   LABSPEC 1.027 01/13/2021 1046   PHURINE 5.0 01/13/2021 1046   GLUCOSEU 50 (A) 01/13/2021 1046   HGBUR SMALL (A) 01/13/2021 1046   BILIRUBINUR NEGATIVE 01/13/2021 1046   KETONESUR 5 (A) 01/13/2021 1046   PROTEINUR 30 (A) 01/13/2021 1046   NITRITE NEGATIVE 01/13/2021 1046   LEUKOCYTESUR TRACE (A) 01/13/2021 1046   Sepsis Labs Invalid input(s): PROCALCITONIN,  WBC,  LACTICIDVEN Microbiology No results found for this or any previous visit (from the past 240 hour(s)).  Time coordinating discharge: Over 30  minutes ° °SIGNED: ° °Sumayya Amin, MD  °[facility] °02/03/2021, 10:29 AM ° °If 7PM-7AM, please contact night-coverage [email or web address] ° °This  record has been created using Dragon voice recognition software. Errors have been sought and corrected,but may not always be located. Such creation errors do not reflect on the standard of care.  °

## 2021-02-03 NOTE — Discharge Summary (Incomplete Revision)
Physician Discharge Summary  Erik [name] VWU:[id number] DOB: June 17, 1991 DOA: 01/12/2021  PCP: Oneita Hurt, No  Admit date: 01/12/2021 Discharge date: 02/03/2021  Admitted From: Home Disposition:  CIR  Recommendations for Outpatient Follow-up:  1. Follow up with PCP in 1-2 weeks 2. Please obtain BMP/CBC in one week 3. Please follow up on the following pending results:None  Home Health:No Equipment/Devices: Discharge Condition: Stable CODE STATUS: Full Diet recommendation: Heart Healthy / Carb Modified   Brief/Interim Summary:   Discharge Diagnoses:  Principal Problem:   Acute hypoxemic respiratory failure due to COVID-19 [facility]) Active Problems:   DKA (diabetic ketoacidosis) (HCC)   Obesity, Class III, BMI 40-49.9 (morbid obesity) (HCC)   Acute metabolic encephalopathy   ARDS (adult respiratory distress syndrome) (HCC)   ST elevation myocardial infarction (STEMI) (HCC)   Acute respiratory failure with hypoxia (HCC)   VT (ventricular tachycardia) (HCC)   Elevated troponin    Discharge Instructions  Discharge Instructions    Ambulatory referral to Nutrition and Diabetic Education   Complete by: As directed    Admit with COVID and DKA/ New Diagnosis of Diabetes.  Pt to go to inpatient Rehab after discharge.  Not sure what timeline is yet for discharge to home from Rehab.  Will need insulin for home.   Diet - low sodium heart healthy   Complete by: As directed    Increase activity slowly   Complete by: As directed    No dressing needed   Complete by: As directed      Allergies as of 02/03/2021   No Known Allergies     Medication List    TAKE these medications   albuterol 108 (90 Base) MCG/ACT inhaler Commonly known as: VENTOLIN HFA Inhale 1-2 puffs into the lungs every 6 (six) hours.   aspirin 81 MG chewable tablet Chew 1 tablet (81 mg total) by mouth daily. Start taking on: February 04, 2021   atorvastatin 80 MG tablet Commonly known as: LIPITOR Take 1 tablet (80 mg  total) by mouth daily. Start taking on: February 04, 2021   clopidogrel 75 MG tablet Commonly known as: PLAVIX Take 1 tablet (75 mg total) by mouth daily. Start taking on: February 04, 2021   dextromethorphan-guaiFENesin 30-600 MG 12hr tablet Commonly known as: MUCINEX DM Take 1 tablet by mouth 2 (two) times daily as needed for cough.   docusate sodium 100 MG capsule Commonly known as: COLACE Take 1 capsule (100 mg total) by mouth 2 (two) times daily as needed for mild constipation.   Ensure Max Protein Liqd Take 330 mLs (11 oz total) by mouth 2 (two) times daily between meals.   famotidine 20 MG tablet Commonly known as: PEPCID Take 1 tablet (20 mg total) by mouth daily. Start taking on: February 04, 2021   insulin aspart 100 UNIT/ML injection Commonly known as: novoLOG Inject 0-20 Units into the skin 4 (four) times daily -  before meals and at bedtime.   insulin aspart 100 UNIT/ML injection Commonly known as: novoLOG Inject 8 Units into the skin 3 (three) times daily with meals.   insulin detemir 100 UNIT/ML injection Commonly known as: LEVEMIR Inject 0.37 mLs (37 Units total) into the skin daily. Start taking on: February 04, 2021   metoprolol tartrate 50 MG tablet Commonly known as: LOPRESSOR Take 1 tablet (50 mg total) by mouth 2 (two) times daily.   multivitamin with minerals Tabs tablet Take 1 tablet by mouth daily. Start taking on: February 04, 2021   ondansetron  4 MG tablet Commonly known as: ZOFRAN Take 1 tablet (4 mg total) by mouth every 6 (six) hours as needed for nausea.   polyethylene glycol 17 g packet Commonly known as: MIRALAX / GLYCOLAX Take 17 g by mouth daily as needed for moderate constipation.            Discharge Care Instructions  (From admission, onward)         Start     Ordered   02/03/21 0000  No dressing needed        02/03/21 1028          No Known Allergies  Consultations:    Procedures/Studies: DG Abd 1  View  Result Date: 01/21/2021 CLINICAL DATA:  30 year old male positive COVID-19. Decreased bowel sounds. EXAM: ABDOMEN - 1 VIEW COMPARISON:  01/19/2021 and earlier. FINDINGS: Portable AP supine view at 0626 hours. Stable enteric tube, tip at the distal stomach. Right femoral approach catheter appears stable. Continued largely absent bowel gas throughout the abdomen and pelvis. Trace gas in the region of the rectum. No dilated bowel loops are evident. Stable visualized osseous structures. IMPRESSION: 1. Stable enteric tube and right femoral catheter. 2. Continued virtual absence of bowel gas throughout the abdomen and pelvis. No dilated loops are evident, although if there is new abdominal distension then recommend follow-up CT Abdomen and Pelvis as fluid-filled abnormal bowel is difficult to exclude. Electronically Signed   By: Odessa Fleming M.D.   On: 01/21/2021 06:49   DG Abd 1 View  Result Date: 01/19/2021 CLINICAL DATA:  Ileus.  Reported COVID-19 pneumonitis EXAM: ABDOMEN - 1 VIEW COMPARISON:  January 13, 2021. FINDINGS: The nasogastric tube tip in region of pylorus. Right femoral catheter tip is at the L4 level. Apparent rectal thermometer present. There is a relative paucity of bowel gas. No bowel dilatation or air-fluid levels. No free air evident on supine examination. Airspace opacity left base, incompletely visualized. IMPRESSION: Nasogastric tube tip at gastric pylorus level. Paucity of bowel gas may be indicative of ileus or enteritis. Bowel obstruction felt to be less likely. No free air evident. Electronically Signed   By: Bretta Bang III M.D.   On: 01/19/2021 11:31   DG Abd 1 View  Result Date: 01/13/2021 CLINICAL DATA:  Orogastric tube placement EXAM: ABDOMEN - 1 VIEW COMPARISON:  01/12/2021 FINDINGS: Limited view of the lower chest and upper abdomen was performed for the purposes of enteric tube localization. Enteric tube courses below the diaphragm with distal tip extending into the distal  aspect of the gastric body. Multiple overlying cardiac leads. Cardiomegaly. IMPRESSION: Enteric tube courses below the diaphragm with distal tip extending into the distal aspect of the gastric body. Electronically Signed   By: Duanne Guess D.O.   On: 01/13/2021 11:31   DG Abdomen 1 View  Result Date: 01/12/2021 CLINICAL DATA:  NG tube placement. EXAM: ABDOMEN - 1 VIEW COMPARISON:  None. FINDINGS: Tip of the enteric tube is below the diaphragm in the stomach, the side port is in the distal esophagus. Advancement of at least 7 cm is recommended for optimal placement. Normal bowel gas pattern in the included upper abdomen. IMPRESSION: Tip of the enteric tube below the diaphragm in the stomach, the side port in the distal esophagus. Advancement of at least 7 cm recommended for optimal placement. Electronically Signed   By: Narda Rutherford M.D.   On: 01/12/2021 18:03   CARDIAC CATHETERIZATION  Result Date: 01/20/2021 Conclusions: 1. Minimal coronary artery plaquing without  angiographically significant coronary artery disease. 2. Moderately reduced left ventricular contraction with global hypokinesis. 3. Mildly elevated left ventricular filling pressure (LVEDP ~20 mmHg). Recommendations: 1. No obvious coronary artery lesion to explain ST segment changes and sustained ventricular tachycardia.  Possible causes include metabolic/electrolyte derangements and myocarditis in the setting of COVID-19. 2. Trend HS-TnI until it has peaked, then stop. 3. Obtain transthoracic echocardiogram. 4. If blood pressure tolerates, consider addition of low-dose beta-blocker. 5. If patient has recurrent VT, consider IV amiodarone. 6. Replete electrolytes to maintain K > 4.0 and Mg > 2.0. Yvonne Kendall, MD [facility]   US RENAL  Result Date: 01/13/2021 CLINICAL DATA:  Acute kidney injury. EXAM: RENAL / URINARY TRACT ULTRASOUND COMPLETE COMPARISON:  None. FINDINGS: Right Kidney: Renal measurements: 13.7 x 6.6 x 7.6 cm =  volume: 367 mL. No hydronephrosis. Grossly normal renal parenchymal echogenicity. Normal renal cortical thickness. No obvious focal renal lesion. Left Kidney: Renal measurements: 12.6 x 7.0 x 6.3 cm = volume: 290 mL. No hydronephrosis. Grossly normal renal parenchymal echogenicity. Normal renal cortical thickness. No obvious focal lesion. Bladder: Decompressed by Foley catheter. Other: Incidental note of hepatic steatosis. Technically limited exam due to patient body habitus and medical status on ventilator. IMPRESSION: Unremarkable renal ultrasound.  No obstructive uropathy. Electronically Signed   By: Narda Rutherford M.D.   On: 01/13/2021 19:30   DG Chest Port 1 View  Result Date: 01/19/2021 CLINICAL DATA:  Acute respiratory failure EXAM: PORTABLE CHEST 1 VIEW COMPARISON:  01/15/2021 FINDINGS: The endotracheal tube terminates above the carina. The left-sided dialysis catheter is well position. The enteric tube terminates below the left hemidiaphragm. The lung volumes are low. There is no pneumothorax. There are findings of volume overload/vascular congestion. There is bibasilar atelectasis with probable small bilateral pleural effusions. IMPRESSION: Lines and tubes as above. Low lung volumes with vascular congestion and bibasilar atelectasis. Electronically Signed   By: Katherine Mantle M.D.   On: 01/19/2021 01:36   DG Chest Port 1 View  Result Date: 01/15/2021 CLINICAL DATA:  Central line placement EXAM: PORTABLE CHEST 1 VIEW COMPARISON:  Radiograph 01/14/2021 FINDINGS: Endotracheal tube tip terminates 4.1 cm from the carina. Transesophageal tube tip and side port terminate below the GE junction, beyond the margins of imaging. Dual lumen left IJ approach central venous catheter tip terminates within the right atrium. Telemetry leads and external devices overlie the chest. Patchy heterogeneous opacities are present in both lungs, with a mid to lower lung predominance. No pneumothorax. No effusion. Stable  cardiomediastinal contours. No acute osseous or soft tissue abnormality. IMPRESSION: 1. Endotracheal tube tip terminates 4.1 cm from the carina. 2. Transesophageal tube tip and side port terminate below the GE junction, beyond the margins of imaging. 3. Persistent heterogeneous bilateral opacities, compatible with COVID-19 pneumonia. Electronically Signed   By: Kreg Shropshire M.D.   On: 01/15/2021 06:37   DG Chest Port 1 View  Result Date: 01/14/2021 CLINICAL DATA:  Respiratory failure.  COVID pneumonia. EXAM: PORTABLE CHEST 1 VIEW COMPARISON:  Radiograph yesterday. FINDINGS: Endotracheal tube tip 3.9 cm from the carina. Enteric tube in place with tip below the diaphragm not included in the field of view. Previous right internal jugular central line is not definitively seen. Improved retrocardiac opacity from prior exam. Background patchy heterogeneous opacities persist, not significantly changed. Stable heart size and mediastinal contours. No pneumothorax or large pleural effusion. IMPRESSION: 1. Improved retrocardiac opacity. Additional heterogeneous bilateral lung opacities are not significantly changed, typical of COVID pneumonia. 2. Endotracheal  tube tip 3.9 cm from the carina. Enteric tube in place. Electronically Signed   By: Narda Rutherford M.D.   On: 01/14/2021 17:40   DG Chest Port 1 View  Result Date: 01/12/2021 CLINICAL DATA:  Central line placement. EXAM: PORTABLE CHEST 1 VIEW COMPARISON:  Earlier today. FINDINGS: Right-sided central line tip projects over the mid SVC. No pneumothorax. Endotracheal tube appears low in positioning 9 mm from the carina. Enteric tube in place with side-port now in the stomach below the diaphragm. Persistent volume loss at the left lung base with dense airspace opacity. Patchy opacity in the left perihilar region and right mid lung are similar. IMPRESSION: 1. Tip of the right central line projects over the mid SVC. No pneumothorax. 2. Endotracheal tube appears low in  positioning 9 mm from the carina. Recommend retraction of 1-2 cm. 3. Enteric tube in place with side-port now in the stomach below the diaphragm. 4. Dense retrocardiac opacity, similar to prior. Additional patchy opacities throughout both lungs not significantly changed. Electronically Signed   By: Narda Rutherford M.D.   On: 01/12/2021 23:36   DG Chest Portable 1 View  Result Date: 01/12/2021 CLINICAL DATA:  Intubation. EXAM: PORTABLE CHEST 1 VIEW COMPARISON:  Chest radiograph earlier today. FINDINGS: Endotracheal tube tip is 2.4 cm from the carina. Enteric tube is in place tip below the diaphragm, side-port in the distal esophagus, this is been slightly advanced on subsequent abdominal radiograph. Patient is rotated. Increasing volume loss and opacity at the left lung base. Streaky opacities in the right lower lung zone. Heart size grossly normal, not well assessed. No pneumothorax. Limited assessment for pleural effusion on the current exam. IMPRESSION: 1. Endotracheal tube tip 2.4 cm from the carina. 2. Enteric tube tip below the diaphragm, side-port in the distal esophagus. 3. Increasing volume loss and opacity at the left lung base, likely atelectasis and or lobar collapse/mucous plugging. Progressive is also considered. Streaky opacities in the right lower lung zone may be atelectasis or pneumonia. Electronically Signed   By: Narda Rutherford M.D.   On: 01/12/2021 18:02   DG Chest Portable 1 View  Result Date: 01/12/2021 CLINICAL DATA:  Shortness of breath. EXAM: PORTABLE CHEST 1 VIEW COMPARISON:  No prior. FINDINGS: Cardiomegaly. Low lung volumes. Bilateral pulmonary infiltrates/edema. No prominent pleural effusion. No pneumothorax. No acute bony abnormality. IMPRESSION: 1. Cardiomegaly. 2. Low lung volumes. Bilateral pulmonary infiltrates/edema. Electronically Signed   By: Maisie Fus  Register   On: 01/12/2021 09:16   ECHOCARDIOGRAM COMPLETE  Result Date: 01/20/2021    ECHOCARDIOGRAM REPORT   Patient  Name:   Erik [name] Date of Exam: 01/20/2021 Medical Rec #:  [id number]    Height:       72.0 in Accession #:    [id number]   Weight:       425.0 lb Date of Birth:  1991/03/16    BSA:          2.935 m Patient Age:    29 years     BP:           99/52 mmHg Patient Gender: M            HR:           80 bpm. Exam Location:  [facility] Procedure: 2D Echo, Color Doppler and Cardiac Doppler Indications:    I47.2 Ventricular Tachycardia  History:        Patient has no prior history of Echocardiogram examinations.  Asthma                 Pt tested positive for COVID-19 on 01/12/21.  Sonographer:    Humphrey RollsJoan Heiss RDCS (AE) Referring Phys: [id number] CHRISTOPHER END Diagnosing      Debbe OdeaBrian Agbor-Etang MD Phys:  Sonographer Comments: Echo performed with patient supine and on artificial respirator and suboptimal apical window. Image acquisition challenging due to patient body habitus. IMPRESSIONS  1. Left ventricular ejection fraction, by estimation, is 60 to 65%. The left ventricle has normal function. The left ventricle has no regional wall motion abnormalities. Left ventricular diastolic parameters were normal.  2. Right ventricular systolic function is normal. The right ventricular size is normal.  3. The mitral valve is normal in structure. No evidence of mitral valve regurgitation. No evidence of mitral stenosis.  4. The aortic valve is normal in structure. Aortic valve regurgitation is not visualized. No aortic stenosis is present.  5. The inferior vena cava is normal in size with greater than 50% respiratory variability, suggesting right atrial pressure of 3 mmHg. FINDINGS  Left Ventricle: Left ventricular ejection fraction, by estimation, is 60 to 65%. The left ventricle has normal function. The left ventricle has no regional wall motion abnormalities. The left ventricular internal cavity size was normal in size. There is  no left ventricular hypertrophy. Left ventricular diastolic parameters were normal. Right Ventricle: The  right ventricular size is normal. No increase in right ventricular wall thickness. Right ventricular systolic function is normal. Left Atrium: Left atrial size was normal in size. Right Atrium: Right atrial size was normal in size. Pericardium: There is no evidence of pericardial effusion. Mitral Valve: The mitral valve is normal in structure. No evidence of mitral valve regurgitation. No evidence of mitral valve stenosis. MV peak gradient, 4.2 mmHg. The mean mitral valve gradient is 2.0 mmHg. Tricuspid Valve: The tricuspid valve is normal in structure. Tricuspid valve regurgitation is not demonstrated. No evidence of tricuspid stenosis. Aortic Valve: The aortic valve is normal in structure. Aortic valve regurgitation is not visualized. No aortic stenosis is present. Aortic valve mean gradient measures 4.0 mmHg. Aortic valve peak gradient measures 7.7 mmHg. Aortic valve area, by VTI measures 3.59 cm. Pulmonic Valve: The pulmonic valve was normal in structure. Pulmonic valve regurgitation is not visualized. No evidence of pulmonic stenosis. Aorta: The aortic root is normal in size and structure. Venous: The inferior vena cava is normal in size with greater than 50% respiratory variability, suggesting right atrial pressure of 3 mmHg. IAS/Shunts: No atrial level shunt detected by color flow Doppler.  LEFT VENTRICLE PLAX 2D LVIDd:         5.10 cm  Diastology LVIDs:         2.90 cm  LV e' medial:    6.53 cm/s LV PW:         1.00 cm  LV E/e' medial:  14.0 LV IVS:        0.90 cm  LV e' lateral:   6.74 cm/s LVOT diam:     2.30 cm  LV E/e' lateral: 13.6 LV SV:         76 LV SV Index:   26 LVOT Area:     4.15 cm  LEFT ATRIUM           Index LA diam:      4.10 cm 1.40 cm/m LA Vol (A4C): 62.0 ml 21.12 ml/m  AORTIC VALVE  PULMONIC VALVE AV Area (Vmax):    3.23 cm    PV Vmax:       1.54 m/s AV Area (Vmean):   3.44 cm    PV Vmean:      88.800 cm/s AV Area (VTI):     3.59 cm    PV VTI:        0.259 m AV  Vmax:           139.00 cm/s PV Peak grad:  9.5 mmHg AV Vmean:          93.100 cm/s PV Mean grad:  4.0 mmHg AV VTI:            0.213 m AV Peak Grad:      7.7 mmHg AV Mean Grad:      4.0 mmHg LVOT Vmax:         108.00 cm/s LVOT Vmean:        77.100 cm/s LVOT VTI:          0.184 m LVOT/AV VTI ratio: 0.86  AORTA Ao Root diam: 3.10 cm MITRAL VALVE MV Area (PHT): 3.56 cm    SHUNTS MV Area VTI:   2.67 cm    Systemic VTI:  0.18 m MV Peak grad:  4.2 mmHg    Systemic Diam: 2.30 cm MV Mean grad:  2.0 mmHg MV Vmax:       1.02 m/s MV Vmean:      58.2 cm/s MV Decel Time: 213 msec MV E velocity: 91.70 cm/s MV A velocity: 39.80 cm/s MV E/A ratio:  2.30 Debbe Odea MD Electronically signed by Debbe Odea MD Signature Date/Time: 01/20/2021/5:48:37 PM    Final     Subjective:  pt. Was seen and examined, no new complaints.  Ready to start Rehab with CIR  Discharge Exam: Vitals:   02/03/21 0437 02/03/21 0724  BP: (!) 139/95 140/89  Pulse: (!) 109 (!) 110  Resp: 18 18  Temp: 99.1 F (37.3 C) 97.9 F (36.6 C)  SpO2: 100% 98%   Vitals:   02/03/21 0037 02/03/21 0437 02/03/21 0500 02/03/21 0724  BP: (!) 140/94 (!) 139/95  140/89  Pulse: (!) 102 (!) 109  (!) 110  Resp: 20 18  18   Temp: 98.7 F (37.1 C) 99.1 F (37.3 C)  97.9 F (36.6 C)  TempSrc:  Oral    SpO2: 98% 100%  98%  Weight:   (!) 166 kg   Height:        General: Pt is morbidly obese, alert, awake, not in acute distress Cardiovascular: RRR, S1/S2 +, no rubs, no gallops Respiratory: CTA bilaterally, no wheezing, no rhonchi Abdominal: Soft, NT, ND, bowel sounds + Extremities: no edema, no cyanosis   The results of significant diagnostics from this hospitalization (including imaging, microbiology, ancillary and laboratory) are listed below for reference.    Microbiology: No results found for this or any previous visit (from the past 240 hour(s)).   Labs: BNP (last 3 results) Recent Labs    01/17/21 1536 01/28/21 1050  BNP  60.4 38.7   Basic Metabolic Panel: Recent Labs  Lab 01/29/21 0515 01/30/21 0414 01/31/21 0402 02/01/21 0429 02/02/21 0409 02/03/21 0426  NA 151* 145  --  139 140 139  K 4.1 4.2  --  4.1 4.0 3.8  CL 119* 113*  --  107 107 108  CO2 24 26  --  24 25 23   GLUCOSE 135* 144*  --  146* 124* 111*  BUN 33*  29*  --  25* 26* 27*  CREATININE 2.15* 1.96*  --  1.73* 1.63* 1.69*  CALCIUM 9.1 8.7*  --  8.5* 8.8* 8.5*  MG 1.9 1.6* 2.1 2.0 1.9 1.7   Liver Function Tests: Recent Labs  Lab 01/28/21 1050 01/29/21 0515 01/30/21 0414  AST 29 14* 13*  ALT 25 19 18   ALKPHOS 70 64 58  BILITOT 0.4 0.5 0.5  PROT 6.9 6.1* 6.3*  ALBUMIN 2.6* 2.4* 2.7*   No results for input(s): LIPASE, AMYLASE in the last 168 hours. No results for input(s): AMMONIA in the last 168 hours. CBC: Recent Labs  Lab 01/27/21 1105 01/28/21 1050 01/29/21 0515 01/30/21 0414 01/31/21 0402  WBC 8.8 9.6 9.0 8.1 9.4  NEUTROABS 6.2 8.0* 5.8 4.9 5.9  HGB 9.1* 9.6* 8.9* 9.2* 8.3*  HCT 30.7* 33.5* 30.9* 30.3* 26.9*  MCV 88.5 88.6 89.3 87.8 85.7  PLT 350 337 310 268 256   Cardiac Enzymes: No results for input(s): CKTOTAL, CKMB, CKMBINDEX, TROPONINI in the last 168 hours. BNP: Invalid input(s): POCBNP CBG: Recent Labs  Lab 02/02/21 0837 02/02/21 1127 02/02/21 1625 02/02/21 2123 02/03/21 0803  GLUCAP 111* 178* 151* 106* 114*   D-Dimer No results for input(s): DDIMER in the last 72 hours. Hgb A1c No results for input(s): HGBA1C in the last 72 hours. Lipid Profile No results for input(s): CHOL, HDL, LDLCALC, TRIG, CHOLHDL, LDLDIRECT in the last 72 hours. Thyroid function studies No results for input(s): TSH, T4TOTAL, T3FREE, THYROIDAB in the last 72 hours.  Invalid input(s): FREET3 Anemia work up No results for input(s): VITAMINB12, FOLATE, FERRITIN, TIBC, IRON, RETICCTPCT in the last 72 hours. Urinalysis    Component Value Date/Time   COLORURINE AMBER (A) 01/13/2021 1046   APPEARANCEUR TURBID (A)  01/13/2021 1046   LABSPEC 1.027 01/13/2021 1046   PHURINE 5.0 01/13/2021 1046   GLUCOSEU 50 (A) 01/13/2021 1046   HGBUR SMALL (A) 01/13/2021 1046   BILIRUBINUR NEGATIVE 01/13/2021 1046   KETONESUR 5 (A) 01/13/2021 1046   PROTEINUR 30 (A) 01/13/2021 1046   NITRITE NEGATIVE 01/13/2021 1046   LEUKOCYTESUR TRACE (A) 01/13/2021 1046   Sepsis Labs Invalid input(s): PROCALCITONIN,  WBC,  LACTICIDVEN Microbiology No results found for this or any previous visit (from the past 240 hour(s)).  Time coordinating discharge: Over 30 minutes  SIGNED:  03/13/2021, MD  [facility] 02/03/2021, 10:29 AM  If 7PM-7AM, please contact night-coverage [email or web address]  This record has been created using 02/05/2021. Errors have been sought and corrected,but may not always be located. Such creation errors do not reflect on the standard of care.

## 2021-02-03 NOTE — H&P (Signed)
Physical Medicine and Rehabilitation Admission H&P    Chief Complaint  Patient presents with  . Shortness of Breath  . Hyperglycemia  : HPI: Erik [name] is a 30 year old right-handed male with unremarkable past medical history except obesity with BMI 49.64 on no prescription medications and is not followed by a medical provider.  History taken from chart review and patient.  Patient lives with parent.  Multilevel home bed and bath upstairs.  Independent prior to admission.  He presented on 01/12/2021 with progressive weakness, poor p.o. intake, lethargy x2 weeks.  Denied any fever.  Patient with bouts of nausea vomiting.  Chest x-ray showed cardiomegaly.  No pneumothorax.  Bilateral pulmonary infiltrate/edema admission chemistry sodium 142, potassium 5.7, glucose 802 BUN 74 creatinine 3.06 total bilirubin 1.8, WBC 17,300, hemoglobin 15.8, lactic acid 3.3, blood cultures no growth to date, hemoglobin A1c 13.6 SARS coronavirus positive.  Patient was placed on intravenous remdesivir as well as Decadron.  Airborne and contact precautions implemented and discontinued on 02/01/2021 and patient currently maintained on room air.  Renal ultrasound showed no obstructive uropathy.  Patient did initially require intubation for airway protection.  Renal service was consulted and CRRT initiated.  Hospital course further complicated by increasing chest pain on 01/19/2021.  EKG SHOWING MI/STEMI as well as episode of sustained ventricular tachycardia taken to the cardiac Cath Lab on 01/20/2021 that showed no significant CAD suspect myocarditis and continues to be followed by Dr. Cristal De[place] End.  Echocardiogram with ejection fraction of 60-65%.  Patient was placed on metoprolol as well as low-dose aspirin and Plavix.  Lovenox ongoing for DVT prophylaxis.  Insulin initiate as well as follow-up diabetic coordinator for diabetes mellitus.  AKI has resolved CRRT discontinued latest creatinine 1.69.  He is tolerating a regular  diet.  Therapy evaluations completed due to patient decreased functional mobility and endurance and was admitted for a comprehensive rehab program.  Please see preadmission assessment from earlier today as well.  Review of Systems  Constitutional: Positive for malaise/fatigue. Negative for chills and fever.  HENT: Negative for hearing loss.   Eyes: Negative for blurred vision and double vision.  Respiratory: Positive for cough and shortness of breath. Negative for wheezing.   Cardiovascular: Negative for palpitations.  Gastrointestinal: Positive for constipation. Negative for heartburn, nausea and vomiting.  Genitourinary: Negative for dysuria, flank pain and hematuria.  Musculoskeletal: Positive for myalgias.  Skin: Negative for rash.  Neurological: Positive for weakness. Negative for speech change and focal weakness.  All other systems reviewed and are negative.  Past Medical History:  Diagnosis Date  . Asthma    Past Surgical History:  Procedure Laterality Date  . LEFT HEART CATH AND CORONARY ANGIOGRAPHY N/A 01/20/2021   Procedure: LEFT HEART CATH AND CORONARY ANGIOGRAPHY;  Surgeon: Yvonne Kendall, MD;  Location: [facility] INVASIVE CV LAB;  Service: Cardiovascular;  Laterality: N/A;   No family history on file. Social History:  reports that he has never smoked. He has never used smokeless tobacco. He reports that he does not drink alcohol and does not use drugs. Allergies: No Known Allergies No medications prior to admission.    Drug Regimen Review Drug regimen was reviewed and remains appropriate with no significant issues identified  Home: Home Living Family/patient expects to be discharged to:: Private residence Living Arrangements: Parent Available Help at Discharge: Family Type of Home: House Home Layout: Multi-level,Bed/bath upstairs Home Equipment: None   Functional History: Prior Function Level of Independence: Independent Comments: Indep with ADLs, household  and  community mobilization without assist device; denies fall history; no home O2; enjoys video games.  Functional Status:  Mobility: Bed Mobility Overal bed mobility: Needs Assistance Bed Mobility: Supine to Sit,Sit to Supine Rolling: Min assist (for rolling to left side for comfort due to rectal tube) Supine to sit: Max assist,HOB elevated Sit to supine: Mod assist,+2 for physical assistance General bed mobility comments: one person assistance for supine to short sitting. +2 person assistance required for return to bed. verbal cues for sequencing and technique. Transfers Overall transfer level: Needs assistance Equipment used: Rolling walker (2 wheeled) (bariatirc rolling walker) Transfer via Lift Equipment:  ([employer]/process development scientist) Transfers: Sit to/from Stand Sit to Stand: Mod assist (+3 person for safety and body habitus) Squat pivot transfers: Max assist (+3 person for safety and body habitus)  Lateral/Scoot Transfers: Mod assist,+2 physical assistance General transfer comment: patient performed 4 bouts of sit to stand transfers from bed with the bed in the lowest height. patient needs Mod- Max A +2 with lifting assistance reuqired and for faciliation for upright trunk. patient was able to stand fully for more than 10 seconds with rolling walker for UE support. short rest breaks between bouts of standing Ambulation/Gait General Gait Details: ambulation not attempted this session    ADL: ADL Overall ADL's : Needs assistance/impaired Grooming: Wash/dry hands,Oral care,Set up,Sitting Grooming Details (indicate cue type and reason): Continues to use L UE as functional assist, however appears to have increased strength and ROM grossly Upper Body Bathing: Minimal assistance,Set up,Bed level Lower Body Bathing: Maximal assistance,Total assistance,Bed level Lower Body Bathing Details (indicate cue type and reason): using lateral rolling technique, one person assist for bathing task, but 2p assist to  roll for task Upper Body Dressing : Minimal assistance,Bed level Upper Body Dressing Details (indicate cue type and reason): garment oriented for patient Lower Body Dressing: Maximal assistance,Total assistance,Bed level Lower Body Dressing Details (indicate cue type and reason): To don/doff socks Toileting- Clothing Manipulation and Hygiene: Maximal assistance,+2 for physical assistance,Bed level Toileting - Clothing Manipulation Details (indicate cue type and reason): pt with large liquid BM in bed despite FMS connected. Functional mobility during ADLs: Maximal assistance,+2 for physical assistance,Rolling walker (+3 for physical assistance during sit>stand; able to tolerate x4 attempts)  Cognition: Cognition Overall Cognitive Status: Within Functional Limits for tasks assessed Orientation Level: Oriented X4 Cognition Arousal/Alertness: Awake/alert Behavior During Therapy: WFL for tasks assessed/performed Overall Cognitive Status: Within Functional Limits for tasks assessed General Comments: patient following single step commands without difficulty  Physical Exam: Blood pressure 140/89, pulse (!) 110, temperature 97.9 F (36.6 C), resp. rate 18, height 6' (1.829 m), weight (!) 166 kg, SpO2 98 %. Physical Exam Vitals reviewed.  Constitutional:      Appearance: He is obese.  HENT:     Head: Normocephalic and atraumatic.     Right Ear: External ear normal.     Left Ear: External ear normal.     Nose: Nose normal.  Eyes:     General:        Right eye: No discharge.        Left eye: No discharge.     Extraocular Movements: Extraocular movements intact.  Cardiovascular:     Rate and Rhythm: Regular rhythm. Tachycardia present.  Pulmonary:     Effort: Pulmonary effort is normal. No respiratory distress.     Breath sounds: No stridor.  Abdominal:     General: Abdomen is flat. Bowel sounds are normal. There is no distension.  Musculoskeletal:     Cervical back: Normal range of  motion and neck supple.     Comments: No edema or tenderness in extremities  Skin:    General: Skin is warm and dry.  Neurological:     Mental Status: He is alert.     Comments: Alert and oriented x3 and follows commands. Motor: 4/5 throughout  Psychiatric:        Mood and Affect: Affect is blunt.        Behavior: Behavior is slowed.     Results for orders placed or performed during the hospital encounter of 01/12/21 (from the past 48 hour(s))  Glucose, capillary     Status: Abnormal   Collection Time: 02/01/21 12:14 PM  Result Value Ref Range   Glucose-Capillary 189 (H) 70 - 99 mg/dL    Comment: Glucose reference range applies only to samples taken after fasting for at least 8 hours.  Glucose, capillary     Status: Abnormal   Collection Time: 02/01/21  4:44 PM  Result Value Ref Range   Glucose-Capillary 262 (H) 70 - 99 mg/dL    Comment: Glucose reference range applies only to samples taken after fasting for at least 8 hours.  Glucose, capillary     Status: Abnormal   Collection Time: 02/01/21  8:23 PM  Result Value Ref Range   Glucose-Capillary 232 (H) 70 - 99 mg/dL    Comment: Glucose reference range applies only to samples taken after fasting for at least 8 hours.  Basic metabolic panel     Status: Abnormal   Collection Time: 02/02/21  4:09 AM  Result Value Ref Range   Sodium 140 135 - 145 mmol/L   Potassium 4.0 3.5 - 5.1 mmol/L   Chloride 107 98 - 111 mmol/L   CO2 25 22 - 32 mmol/L   Glucose, Bld 124 (H) 70 - 99 mg/dL    Comment: Glucose reference range applies only to samples taken after fasting for at least 8 hours.   BUN 26 (H) 6 - 20 mg/dL   Creatinine, Ser 5.32 (H) 0.61 - 1.24 mg/dL   Calcium 8.8 (L) 8.9 - 10.3 mg/dL   GFR, Estimated 58 (L) >60 mL/min    Comment: (NOTE) Calculated using the CKD-EPI Creatinine Equation (2021)    Anion gap 8 5 - 15    Comment: Performed at [facility], [address] [place], [place] [address]  Magnesium     Status:  None   Collection Time: 02/02/21  4:09 AM  Result Value Ref Range   Magnesium 1.9 1.7 - 2.4 mg/dL    Comment: Performed at [facility], [address] [place], [place] [address]  Glucose, capillary     Status: Abnormal   Collection Time: 02/02/21  8:37 AM  Result Value Ref Range   Glucose-Capillary 111 (H) 70 - 99 mg/dL    Comment: Glucose reference range applies only to samples taken after fasting for at least 8 hours.  Glucose, capillary     Status: Abnormal   Collection Time: 02/02/21 11:27 AM  Result Value Ref Range   Glucose-Capillary 178 (H) 70 - 99 mg/dL    Comment: Glucose reference range applies only to samples taken after fasting for at least 8 hours.  Glucose, capillary     Status: Abnormal   Collection Time: 02/02/21  4:25 PM  Result Value Ref Range   Glucose-Capillary 151 (H) 70 - 99 mg/dL    Comment: Glucose reference range applies  only to samples taken after fasting for at least 8 hours.  Glucose, capillary     Status: Abnormal   Collection Time: 02/02/21  9:23 PM  Result Value Ref Range   Glucose-Capillary 106 (H) 70 - 99 mg/dL    Comment: Glucose reference range applies only to samples taken after fasting for at least 8 hours.  Basic metabolic panel     Status: Abnormal   Collection Time: 02/03/21  4:26 AM  Result Value Ref Range   Sodium 139 135 - 145 mmol/L   Potassium 3.8 3.5 - 5.1 mmol/L   Chloride 108 98 - 111 mmol/L   CO2 23 22 - 32 mmol/L   Glucose, Bld 111 (H) 70 - 99 mg/dL    Comment: Glucose reference range applies only to samples taken after fasting for at least 8 hours.   BUN 27 (H) 6 - 20 mg/dL   Creatinine, Ser 6.28 (H) 0.61 - 1.24 mg/dL   Calcium 8.5 (L) 8.9 - 10.3 mg/dL   GFR, Estimated 56 (L) >60 mL/min    Comment: (NOTE) Calculated using the CKD-EPI Creatinine Equation (2021)    Anion gap 8 5 - 15    Comment: Performed at [facility], [address], [place], [place] [address]  Magnesium     Status: None    Collection Time: 02/03/21  4:26 AM  Result Value Ref Range   Magnesium 1.7 1.7 - 2.4 mg/dL    Comment: Performed at [facility], [address] [place], [place] [address]  Glucose, capillary     Status: Abnormal   Collection Time: 02/03/21  8:03 AM  Result Value Ref Range   Glucose-Capillary 114 (H) 70 - 99 mg/dL    Comment: Glucose reference range applies only to samples taken after fasting for at least 8 hours.   No results found.     Medical Problem List and Plan: 1.  Debility secondary to COVID-19 with course complicated by MI/STEMI as well as AKI  -patient may shower  -ELOS/Goals: 14-17 days/supervision/min a  Admit to CIR 2.  Antithrombotics: -DVT/anticoagulation: Lovenox 82.5 mg daily  -antiplatelet therapy: Aspirin 81 mg daily and Plavix 85 mg daily 3. Pain Management: Oxycodone as needed  By with increased exertion 4. Mood: Provide emotional support  -antipsychotic agents: N/A 5. Neuropsych: This patient is capable of making decisions on his own behalf. 6. Skin/Wound Care: Routine skin checks 7. Fluids/Electrolytes/Nutrition: Routine in and outs 8.  AKI.  Renal function improved.  CRRT discontinued.  Follow-up renal services  CMP ordered for tomorrow. 9.  COVID-19.  Patient completed course of remdesivir as well as steroids.  Contact precautions discontinued 10.  Super obesity.  BMI 53.52 dietary follow-up 11.  Newly diagnosed uncontrolled diabetes mellitus.  Hemoglobin A1c 13.6.  Continue NovoLog 8 units 3 times daily, Levemir 37 units daily  Monitor with increased mobility 12.  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 tach.  Cardiology follow-up.  Cardiac catheterization 01/20/2021 no significant CAD.    Metoprolol 50 mg twice daily. 13.  Hyperlipidemia: Lipitor  Charlton Amor, PA-C 02/03/2021  I have personally performed a face to face diagnostic evaluation, including, but not limited to relevant history and physical exam  findings, of this patient and developed relevant assessment and plan.  Additionally, I have reviewed and concur with the physician assistant's documentation above.  Maryla Morrow, MD, ABPMR  The patient's status has not changed. Any changes from the pre-admission screening or documentation  from the acute chart are noted above.   Delice Lesch, MD, ABPMR

## 2021-02-03 NOTE — PMR Pre-admission (Addendum)
PMR Admission Coordinator Pre-Admission Assessment  Patient: Erik [name] is an 30 y.o., male MRN: [id number] DOB: 10/12/1991 Height: 6' (182.9 cm) Weight: (!) 166 kg              Insurance Information HMO:     PPO:      PCP:      IPA:      80/20:     OTHER:  PRIMARY: Uninsured. Estimate provided on 02/03/21      Policy#:       Subscriber:  CM Name:       Phone#:      Fax#:  Pre-Cert#:       Employer:  Benefits:  Phone #:      Name:  Eff. Date:      Deduct:       Out of Pocket Max:       Life Max:   CIR:       SNF:  Outpatient:     Co-Pay:  Home Health:       Co-Pay:  DME:      Co-Pay:  Providers:  SECONDARY:       Policy#:       Phone#:   Artist:      Phone#:   The Data processing manager" for patients in Inpatient Rehabilitation Facilities with attached "Privacy Act Statement-Health Care Records" was provided and verbally reviewed with: N/A  Emergency Contact Information Contact Information    Name Relation Home Work Erik [name] Mother [phone]  [phone]     Current Medical History  Patient Admitting Diagnosis: post-covid debility   History of Present Illness: Erik [name] is a 30 year old right-handed male with unremarkable past medical history except obesity with BMI 49.64 on no prescription medications and is not followed by a medical provider.  Presented 01/12/2021 with progressive weakness, poor p.o. intake, lethargy x2 weeks.  Denied any fever.  Patient with bouts of nausea vomiting.  Chest x-ray showed cardiomegaly.  No pneumothorax.  Bilateral pulmonary infiltrate/edema admission chemistry sodium 152 potassium 5.7 glucose 802 BUN 74 creatinine 3.06 total bilirubin 1.8, WBC 17,300, hemoglobin 15.8, lactic acid 3.3, blood cultures no growth to date, hemoglobin A1c 13.6 SARS coronavirus positive..  Patient was placed on intravenous remdesivir as well as Decadron.  Airborne and contact precautions implemented and discontinued 2/21 and  patient currently maintained on room air.  Renal ultrasound showed no obstructive uropathy.  Patient did initially require intubation for airway protection.  Renal service was consulted and CRRT initiated.  Hospital course complicated by increased chest pain 01/19/2021 EKG MI/STEMI as well as episode of sustained ventricular tachycardia taken to the cardiac Cath Lab 01/20/2021 that showed no significant CAD suspect myocarditis and continues to be followed by Dr. Cristal Deer End.  Echocardiogram with ejection fraction of 60 to 65%.  Patient was placed on metoprolol as well as low-dose aspirin and Plavix.  Lovenox ongoing for DVT prophylaxis.  Insulin initiate as well as follow-up diabetic coordinator for diabetes mellitus.  AKI has resolved CRRT discontinued latest creatinine 1.69.  He is tolerating a regular diet.  Therapy evaluations completed due to patient decreased functional mobility was recommended for a comprehensive rehab program.    Past Medical History  Past Medical History:  Diagnosis Date  . Asthma     Family History  family history is not on file.  Prior Rehab/Hospitalizations:  Has the patient had prior rehab or hospitalizations prior to admission? No  Has the patient  had major surgery during 100 days prior to admission? No  Current Medications   Current Facility-Administered Medications:  .  0.9 %  sodium chloride infusion, 250 mL, Intravenous, PRN, End, Christopher, MD .  acetaminophen (TYLENOL) 160 MG/5ML solution 650 mg, 650 mg, Oral, Q4H PRN, Ronnald Ramp, RPH .  albuterol (VENTOLIN HFA) 108 (90 Base) MCG/ACT inhaler 1-2 puff, 1-2 puff, Inhalation, Q6H, Amin, Markas Aldredge Chirag, MD, 2 puff at 02/03/21 0934 .  aspirin chewable tablet 81 mg, 81 mg, Oral, Daily, Ronnald Ramp, RPH, 81 mg at 02/03/21 5956 .  atorvastatin (LIPITOR) tablet 80 mg, 80 mg, Oral, Daily, Ronnald Ramp, RPH, 80 mg at 02/03/21 3875 .  Chlorhexidine Gluconate Cloth 2 % PADS 6 each, 6 each, Topical, Daily,  End, Christopher, MD, 6 each at 02/03/21 [phone] .  clopidogrel (PLAVIX) tablet 75 mg, 75 mg, Oral, Daily, Agbor-Etang, Arlys John, MD, 75 mg at 02/03/21 0937 .  dextromethorphan-guaiFENesin (MUCINEX DM) 30-600 MG per 12 hr tablet 1 tablet, 1 tablet, Oral, BID PRN, Amin, Bret Stamour Chirag, MD .  dextrose 50 % solution 0-50 mL, 0-50 mL, Intravenous, PRN, End, Christopher, MD .  docusate sodium (COLACE) capsule 100 mg, 100 mg, Oral, BID PRN, Amin, Graviel Payeur Chirag, MD .  enoxaparin (LOVENOX) injection 82.5 mg, 0.5 mg/kg, Subcutaneous, Q24H, Amin, Yonatan Guitron Chirag, MD, 82.5 mg at 02/02/21 2150 .  famotidine (PEPCID) tablet 20 mg, 20 mg, Oral, Daily, Azucena Fallen, MD, 20 mg at 02/03/21 2951 .  Gerhardt's butt cream, , Topical, BID, Amin, Trystin Terhune Chirag, MD, Given at 02/03/21 [phone] .  hydrALAZINE (APRESOLINE) injection 10 mg, 10 mg, Intravenous, Q4H PRN, Belia Heman, Kurian, MD .  insulin aspart (novoLOG) injection 0-20 Units, 0-20 Units, Subcutaneous, TID AC & HS, Amin, Itzayana Pardy Chirag, MD, 4 Units at 02/02/21 1700 .  insulin aspart (novoLOG) injection 8 Units, 8 Units, Subcutaneous, TID WC, Amin, Linsi Humann Chirag, MD, 8 Units at 02/03/21 [phone] .  insulin detemir (LEVEMIR) injection 37 Units, 37 Units, Subcutaneous, Daily, Dimple Nanas, MD, 37 Units at 02/03/21 [phone] .  lip balm (BLISTEX) ointment 1 application, 1 application, Topical, PRN, Azucena Fallen, MD .  living well with diabetes book MISC, , Does not apply, Once, Amin, Karletta Millay Chirag, MD .  MEDLINE mouth rinse, 15 mL, Mouth Rinse, BID, Kasa, Kurian, MD, 15 mL at 02/03/21 0940 .  metoprolol tartrate (LOPRESSOR) injection 5 mg, 5 mg, Intravenous, Q4H PRN, Amin, Harvie Morua Chirag, MD .  metoprolol tartrate (LOPRESSOR) tablet 50 mg, 50 mg, Oral, BID, Amin, Ahmiya Abee Chirag, MD, 50 mg at 02/03/21 0109 .  multivitamin with minerals tablet 1 tablet, 1 tablet, Oral, Daily, Azucena Fallen, MD, 1 tablet at 02/03/21 [phone] .  ondansetron (ZOFRAN) tablet 4 mg, 4 mg, Oral, Q6H PRN  **OR** ondansetron (ZOFRAN) injection 4 mg, 4 mg, Intravenous, Q6H PRN, End, Christopher, MD, 4 mg at 01/27/21 2210 .  ondansetron (ZOFRAN) injection 4 mg, 4 mg, Intravenous, Q6H PRN, End, Christopher, MD, 4 mg at 01/18/21 1200 .  oxyCODONE (Oxy IR/ROXICODONE) immediate release tablet 5 mg, 5 mg, Oral, Q4H PRN, Amin, Buckley Bradly Chirag, MD, 5 mg at 02/02/21 1304 .  polyethylene glycol (MIRALAX / GLYCOLAX) packet 17 g, 17 g, Oral, Daily PRN, End, Christopher, MD .  protein supplement (ENSURE MAX) liquid, 11 oz, Oral, BID BM, Amin, Arlenne Kimbley Chirag, MD, 11 oz at 02/03/21 0940 .  sodium chloride flush (NS) 0.9 % injection 3 mL, 3 mL, Intravenous, Q12H, End, Christopher, MD, 3 mL at 02/03/21 0940 .  sodium chloride flush (NS) 0.9 % injection 3 mL, 3 mL, Intravenous, PRN, End, Christopher, MD, 3 mL at 02/01/21 2108  Patients Current Diet:  Diet Order            Diet - low sodium heart healthy           Diet regular Room service appropriate? Yes; Fluid consistency: Thin  Diet effective now                 Precautions / Restrictions Precautions Precautions: Fall Restrictions Weight Bearing Restrictions: No Other Position/Activity Restrictions: Temp HD IJ cath   Has the patient had 2 or more falls or a fall with injury in the past year?No  Prior Activity Level Community (5-7x/wk): working from home prior to admit, independent, no DME used at baseline, driving  Prior Functional Level Prior Function Level of Independence: Independent Comments: Indep with ADLs, household and community mobilization without assist device; denies fall history; no home O2; enjoys video games.  Self Care: Did the patient need help bathing, dressing, using the toilet or eating?  Independent  Indoor Mobility: Did the patient need assistance with walking from room to room (with or without device)? Independent  Stairs: Did the patient need assistance with internal or external stairs (with or without device)?  Independent  Functional Cognition: Did the patient need help planning regular tasks such as shopping or remembering to take medications? Independent  Home Assistive Devices / Equipment Home Assistive Devices/Equipment: None Home Equipment: None  Prior Device Use: Indicate devices/aids used by the patient prior to current illness, exacerbation or injury? None of the above  Current Functional Level Cognition  Overall Cognitive Status: Within Functional Limits for tasks assessed Orientation Level: Oriented X4 General Comments: patient following single step commands without difficulty    Extremity Assessment (includes Sensation/Coordination)  Upper Extremity Assessment: Generalized weakness,RUE deficits/detail,LUE deficits/detail RUE Deficits / Details: R UE grossly 3-/5 LUE Deficits / Details: L UE grossly 2+/5;  Lower Extremity Assessment: Defer to PT evaluation    ADLs  Overall ADL's : Needs assistance/impaired Grooming: Wash/dry hands,Oral care,Set up,Sitting Grooming Details (indicate cue type and reason): Continues to use L UE as functional assist, however appears to have increased strength and ROM grossly Upper Body Bathing: Minimal assistance,Set up,Bed level Lower Body Bathing: Maximal assistance,Total assistance,Bed level Lower Body Bathing Details (indicate cue type and reason): using lateral rolling technique, one person assist for bathing task, but 2p assist to roll for task Upper Body Dressing : Minimal assistance,Bed level Upper Body Dressing Details (indicate cue type and reason): garment oriented for patient Lower Body Dressing: Maximal assistance,Total assistance,Bed level Lower Body Dressing Details (indicate cue type and reason): To don/doff socks Toileting- Clothing Manipulation and Hygiene: Maximal assistance,+2 for physical assistance,Bed level Toileting - Clothing Manipulation Details (indicate cue type and reason): pt with large liquid BM in bed despite FMS  connected. Functional mobility during ADLs: Maximal assistance,+2 for physical assistance,Rolling walker (+3 for physical assistance during sit>stand; able to tolerate x4 attempts)    Mobility  Overal bed mobility: Needs Assistance Bed Mobility: Supine to Sit,Sit to Supine Rolling: Min assist (for rolling to left side for comfort due to rectal tube) Supine to sit: Max assist,HOB elevated Sit to supine: Mod assist,+2 for physical assistance General bed mobility comments: one person assistance for supine to short sitting. +2 person assistance required for return to bed. verbal cues for sequencing and technique.    Transfers  Overall transfer level: Needs assistance Equipment used: Rolling  walker (2 wheeled) (bariatirc rolling walker) Transfer via Lift Equipment:  (Viking Product/process development scientist) Transfers: Sit to/from Stand Sit to Stand: Mod assist (+3 person for safety and body habitus) Squat pivot transfers: Max assist (+3 person for safety and body habitus)  Lateral/Scoot Transfers: Mod assist,+2 physical assistance General transfer comment: patient performed 4 bouts of sit to stand transfers from bed with the bed in the lowest height. patient needs Mod- Max A +2 with lifting assistance reuqired and for faciliation for upright trunk. patient was able to stand fully for more than 10 seconds with rolling walker for UE support. short rest breaks between bouts of standing    Ambulation / Gait / Stairs / Wheelchair Mobility  Ambulation/Gait General Gait Details: ambulation not attempted this session    Posture / Balance Dynamic Sitting Balance Sitting balance - Comments: no loss of balance in sitting position Balance Overall balance assessment: Needs assistance Sitting-balance support: Feet supported,No upper extremity supported Sitting balance-Leahy Scale: Fair Sitting balance - Comments: no loss of balance in sitting position Postural control: Posterior lean,Left lateral lean Standing balance-Leahy  Scale: Zero Standing balance comment: Pt able to clear hips for partial standing x3 bouts with difficulty with trunk extension due to fatigue.    Special needs/care consideration Oxygen on room air currently, Diabetic management yes and Special service needs bariatric patient     Previous Home Environment (from acute therapy documentation) Living Arrangements: Parent Available Help at Discharge: Family Type of Home: House Home Layout: Multi-level,Bed/bath upstairs Home Care Services: No  Discharge Living Setting Plans for Discharge Living Setting: Lives with (comment),Patient's home (lives with mother) Type of Home at Discharge: Apartment Discharge Home Layout: One level (on the 3rd floor, could maybe move to 1st) Discharge Home Access: Stairs to enter Entrance Stairs-Rails: Interior and spatial designer of Steps: 2 flights Discharge Bathroom Shower/Tub: Tub/shower unit Discharge Bathroom Toilet: Standard Discharge Bathroom Accessibility: Yes How Accessible: Accessible via walker (would not be accessible with bariatric w/c) Does the patient have any problems obtaining your medications?: Yes (Describe) (uninsured)  Social/Family/Support Systems Anticipated Caregiver: mom Tyrez Berrios Anticipated Caregiver's Contact Information: [phone] Ability/Limitations of Caregiver: she is in dialysis 3x/week but can have other family be with patient if necessary Caregiver Availability: 24/7 Discharge Plan Discussed with Primary Caregiver: Yes Is Caregiver In Agreement with Plan?: Yes Does Caregiver/Family have Issues with Lodging/Transportation while Pt is in Rehab?: No   Goals Patient/Family Goal for Rehab: PT/OT supervision to min assist, SLP n/a Expected length of stay: 21-14 days Additional Information: bariatric patient Pt/Family Agrees to Admission and willing to participate: Yes Program Orientation Provided & Reviewed with Pt/Caregiver Including Roles  & Responsibilities:  Yes  Barriers to Discharge: Inaccessible home environment,Insurance for SNF coverage  Barriers to Discharge Comments: third floor apartment   Decrease burden of Care through IP rehab admission: n/a  Possible need for SNF placement upon discharge: Not anticipated.  Discussed that pt will not be able to d/c to SNF without a payor source and mom verbalizes understanding.   Patient Condition: This patient's condition remains as documented in the consult dated 02/02/21, in which the Rehabilitation Physician determined and documented that the patient's condition is appropriate for intensive rehabilitative care in an inpatient rehabilitation facility. Will admit to inpatient rehab today.  Preadmission Screen Completed By:  Stephania Fragmin, PT, DPT 02/03/2021 11:06 AM ______________________________________________________________________   Discussed status with Dr. Allena Katz on 02/03/21 at 11:08 AM  and received approval for admission today.  Admission Coordinator:  Stephania Fragmin,  PT, DPT time 11:08 AM Dorna Bloom 02/03/21

## 2021-02-03 NOTE — Progress Notes (Signed)
Inpatient Rehab Admissions Coordinator:   I have a bed available for this patient to admit today.  TOC aware.  Will let pt/family know.   Estill Dooms, PT, DPT Admissions Coordinator [phone] 02/03/21  10:51 AM

## 2021-02-03 NOTE — Progress Notes (Signed)
Initial Nutrition Assessment  RD working remotely.  DOCUMENTATION CODES:   Morbid obesity  INTERVENTION:   - Continue Ensure Max po BID, each supplement provides 150 kcal and 30 grams of protein  - Double protein portions TID with meals  - Continue MVI with minerals daily  - Encourage adequate PO intake  NUTRITION DIAGNOSIS:   Increased nutrient needs related to catabolic illness (recent COVID-19 infection) as evidenced by estimated needs.  GOAL:   Patient will meet greater than or equal to 90% of their needs  MONITOR:   PO intake,Supplement acceptance,Labs,Weight trends,I & O's  REASON FOR ASSESSMENT:   Malnutrition Screening Tool    ASSESSMENT:   30 year old male with unremarkable PMH. Presented on 01/12/21 with progressive weakness, poor PO intake, lethargy x 2 weeks. Pt admitted with COVID-19. Pt did initially require intubation for airway protection. Renal service was consulted and CRRT initiated for AKI which has now resolved. Pt admitted to CIR on 2/23.   Unable to reach pt via phone call to room. Reviewed RD notes from acute admission. Pt tolerating Ensure Max oral nutrition supplements. RD will continue with current order. RD will also order double protein portions TID with meals to aid pt in meeting increased kcal and protein needs.  Reviewed weights from acute admission. Current weight of 169.6 kg is similar to weight of 164.9 kg on 01/13/21. Suspect weight fluctuations during acute admission were related to fluid status. Per RN documentation on 02/03/21, pt with no edema at this time. RD will continue to monitor weight trends.  01/13/21: 164.9 kg 01/15/21: 174.2 kg 01/19/21: 192.8 kg 01/25/21: 186.9 kg 02/01/21: 161.5 kg  Meal Completion: 75%  Medications reviewed and include: pepcid, SSI TID with meals and at bedtime, novolog 8 units TID with meals, levemir 37 units daily, MVI with minerals, Ensure Max BID  Labs reviewed: creatinine 1.59, phosphorus 5.3,  hemoglobin 9.3 CBG's: 117-147 x 24 hours  UOP: 1225 ml x 12 hours  NUTRITION - FOCUSED PHYSICAL EXAM:  Unable to complete at this time.  Diet Order:   Diet Order            Diet Carb Modified Fluid consistency: Thin; Room service appropriate? Yes  Diet effective now                 EDUCATION NEEDS:   No education needs have been identified at this time  Skin:  Skin Assessment: Skin Integrity Issues: Other: skin tear to vertebral column, MASD to buttocks  Last BM:  02/03/21 medium type 5  Height:   Ht Readings from Last 1 Encounters:  02/03/21 5\' 10"  (1.778 m)    Weight:   Wt Readings from Last 1 Encounters:  02/04/21 (!) 169.6 kg    BMI:  Body mass index is 53.65 kg/m.  Estimated Nutritional Needs:   Kcal:  2700-2900  Protein:  150-170 grams  Fluid:  >/= 2.5 L    02/06/21, MS, RD, LDN Inpatient Clinical Dietitian Please see AMiON for contact information.

## 2021-02-03 NOTE — Progress Notes (Signed)
Inpatient Rehabilitation Medication Review by a Pharmacist  A complete drug regimen review was completed for this patient to identify any potential clinically significant medication issues.  Clinically significant medication issues were identified:  no  Check AMION for pharmacist assigned to patient if future medication questions/issues arise during this admission.  Pharmacist comments:   Time spent performing this drug regimen review (minutes):  5   Lauria Depoy A. Jeanella Craze, PharmD, BCPS, FNKF Clinical Pharmacist [place] Please utilize Amion for appropriate phone number to reach the unit pharmacist [facility])  02/03/2021 1:41 PM

## 2021-02-03 NOTE — Progress Notes (Signed)
Called to give report to CIR, nurse not available. Gave number to Diplomatic Services operational officer for call back.

## 2021-02-04 DIAGNOSIS — R5381 Other malaise: Principal | ICD-10-CM

## 2021-02-04 LAB — CBC WITH DIFFERENTIAL/PLATELET
Abs Immature Granulocytes: 0.07 10*3/uL (ref 0.00–0.07)
Basophils Absolute: 0 10*3/uL (ref 0.0–0.1)
Basophils Relative: 0 %
Eosinophils Absolute: 0.4 10*3/uL (ref 0.0–0.5)
Eosinophils Relative: 4 %
HCT: 31.3 % — ABNORMAL LOW (ref 39.0–52.0)
Hemoglobin: 9.3 g/dL — ABNORMAL LOW (ref 13.0–17.0)
Immature Granulocytes: 1 %
Lymphocytes Relative: 25 %
Lymphs Abs: 2.2 10*3/uL (ref 0.7–4.0)
MCH: 25.8 pg — ABNORMAL LOW (ref 26.0–34.0)
MCHC: 29.7 g/dL — ABNORMAL LOW (ref 30.0–36.0)
MCV: 86.9 fL (ref 80.0–100.0)
Monocytes Absolute: 1 10*3/uL (ref 0.1–1.0)
Monocytes Relative: 11 %
Neutro Abs: 5.2 10*3/uL (ref 1.7–7.7)
Neutrophils Relative %: 59 %
Platelets: 317 10*3/uL (ref 150–400)
RBC: 3.6 MIL/uL — ABNORMAL LOW (ref 4.22–5.81)
RDW: 15 % (ref 11.5–15.5)
WBC: 8.8 10*3/uL (ref 4.0–10.5)
nRBC: 0 % (ref 0.0–0.2)

## 2021-02-04 LAB — COMPREHENSIVE METABOLIC PANEL
ALT: 20 U/L (ref 0–44)
AST: 14 U/L — ABNORMAL LOW (ref 15–41)
Albumin: 2.4 g/dL — ABNORMAL LOW (ref 3.5–5.0)
Alkaline Phosphatase: 57 U/L (ref 38–126)
Anion gap: 11 (ref 5–15)
BUN: 17 mg/dL (ref 6–20)
CO2: 22 mmol/L (ref 22–32)
Calcium: 8.8 mg/dL — ABNORMAL LOW (ref 8.9–10.3)
Chloride: 106 mmol/L (ref 98–111)
Creatinine, Ser: 1.59 mg/dL — ABNORMAL HIGH (ref 0.61–1.24)
GFR, Estimated: 60 mL/min — ABNORMAL LOW (ref >60)
Glucose, Bld: 117 mg/dL — ABNORMAL HIGH (ref 70–99)
Potassium: 3.7 mmol/L (ref 3.5–5.1)
Sodium: 139 mmol/L (ref 135–145)
Total Bilirubin: 0.4 mg/dL (ref 0.3–1.2)
Total Protein: 5.9 g/dL — ABNORMAL LOW (ref 6.5–8.1)

## 2021-02-04 LAB — GLUCOSE, CAPILLARY
Glucose-Capillary: 120 mg/dL — ABNORMAL HIGH (ref 70–99)
Glucose-Capillary: 125 mg/dL — ABNORMAL HIGH (ref 70–99)
Glucose-Capillary: 110 mg/dL — ABNORMAL HIGH (ref 70–99)
Glucose-Capillary: 123 mg/dL — ABNORMAL HIGH (ref 70–99)

## 2021-02-04 LAB — PHOSPHORUS: Phosphorus: 5.3 mg/dL — ABNORMAL HIGH (ref 2.5–4.6)

## 2021-02-04 MED ORDER — ALBUTEROL SULFATE HFA 108 (90 BASE) MCG/ACT IN AERS
1.0000 | INHALATION_SPRAY | Freq: Four times a day (QID) | RESPIRATORY_TRACT | Status: DC | PRN
  Administered 2021-02-26: 2 via RESPIRATORY_TRACT

## 2021-02-04 NOTE — Progress Notes (Signed)
Occupational Therapy Session Note  Patient Details  Name: Erik [name] MRN: [id number] Date of Birth: Jan 25, 1991  Today's Date: 02/04/2021 OT Individual Time: 1102-1200 OT Individual Time Calculation (min): 58 min   Short Term Goals: Week 1:  OT Short Term Goal 1 (Week 1): Patient will complete sit<>stand with max A of 1 person OT Short Term Goal 2 (Week 1): Patient will maintain standing in [place] for 1 minute in prep for BADL task OT Short Term Goal 3 (Week 1): Pt will complete 1 step up LB dressing task  Skilled Therapeutic Interventions/Progress Updates:    Patient greeted semi-reclined in bed and agreeable to OT treatment session. Pt completed bed mobility with mod A. Sit<>stand with Stedy from raised bed with max A +2. Pt then transferred over to bariatric wc. Pt able to stand from perched stedy with min A. Pt brought down to therapy gym and completed 3 sets of 10 knee extension and seated hip extension with 3 lb ankle weights. UB there-ex with 3 sets of 10 chest press, bicep curl and straight arm raises using 2 lb dowel rod. OT assist with L shoulder 2/2 weakness. Pt completed sit<>stand from lower wc with Stedy and mod A +2. Pt able to power up much better at second session. Pt completed 5 sit<>stands from perched Stedy height with focus on hip and trunk extension into standing. Pt returned to room at end of session and pt left seated in wc with call bell in reach and needs met.   Therapy Documentation Precautions:  Restrictions Weight Bearing Restrictions: No Pain: Denies pain  Therapy/Group: Individual Therapy  Valma Cava 02/04/2021, 12:20 PM

## 2021-02-04 NOTE — Progress Notes (Signed)
Inpatient Rehabilitation Care Coordinator Assessment and Plan Patient Details  Name: Erik [name] MRN: [id number] Date of Birth: Oct 04, 1991  Today's Date: 02/04/2021  Hospital Problems: Principal Problem:   Debility  Past Medical History:  Past Medical History:  Diagnosis Date  . Asthma   . Diabetes mellitus without complication [facility])    Past Surgical History:  Past Surgical History:  Procedure Laterality Date  . LEFT HEART CATH AND CORONARY ANGIOGRAPHY N/A 01/20/2021   Procedure: LEFT HEART CATH AND CORONARY ANGIOGRAPHY;  Surgeon: Nelva Bush, MD;  Location: [place] [facility];  Service: Cardiovascular;  Laterality: N/A;   Social History:  reports that he has never smoked. He has never used smokeless tobacco. He reports that he does not drink alcohol and does not use drugs.  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 Caregiver Availability: Intermittent Family Dynamics: Pt lives with his mother and grandfather in an apartment. Pt was primary caregiver for his grandfather in which he helped him with his meals, medications, etc.  Social History Preferred language: English Religion: Baptist Cultural Background: Pt has been caring for his grandfather Education: high school grad Read: Yes Write: Yes Employment Status: Unemployed Date Retired/Disabled/Unemployed: N/A Public relations account executive Issues: Denies Guardian/Conservator: N/A   Abuse/Neglect Abuse/Neglect Assessment Can Be Completed: Yes Physical Abuse: Denies Verbal Abuse: Denies Sexual Abuse: Denies Exploitation of patient/patient's resources: Denies Self-Neglect: Denies  Emotional Status Pt's affect, behavior and adjustment status: Pt in good spirits at time of visit. Recent Psychosocial  Issues: Pt denies Psychiatric History: Denies Substance Abuse History: Denies; pt states etoh is a rare occassion  Patient / Family Perceptions, Expectations & Goals Pt/Family understanding of illness & functional limitations: Pt has general understanding of care needs Premorbid pt/family roles/activities: Independent Anticipated changes in roles/activities/participation: Assistance with ADLs/IADLs Pt/family expectations/goals: Pt goal is to walk.  Community Resources Express Scripts: None Premorbid Home Care/DME Agencies: None Transportation available at discharge: mother Resource referrals recommended: Neuropsychology  Discharge Planning Living Arrangements: Parent,Other relatives Support Systems: Parent Type of Residence: Private residence Insurance Resources: Teacher, adult education Resources: Family Support Financial Screen Referred: No (SW updated Erik [name]/ financial counseling on pt requring a screen.) Living Expenses: Rent Money Management: Family Does the patient have any problems obtaining your medications?: No Home Management: Pt managed home care needs Patient/Family Preliminary Plans: Mother Care Coordinator Barriers to Discharge: Decreased caregiver support,Lack of/limited family support Care Coordinator Barriers to Discharge Comments: Pt is uninsured. Care Coordinator Anticipated Follow Up Needs: HH/OP  Clinical Impression SW met with pt in room to introduce self, explain role, and discuss discharge process. Pt has no HCPOA. No DME. Pt is uninsured. SW discussed with pt on charity HH/DME, and [facility] medication assistance program. SW to provide pt with community resources. Pt aware SW to follow-up with his mother.   SW left information on Erik [name] and [facility] to establish primary care.   Erik [name] A Erik [name] 02/04/2021, 3:31 PM

## 2021-02-04 NOTE — Progress Notes (Signed)
PROGRESS NOTE   Subjective/Complaints: C/o right sided upper back pain due to his sleeping position last night. No other complaints this morning Admission labs stable  ROS: + upper right sided back pain Objective:   No results found. Recent Labs    02/04/21 0503  WBC 8.8  HGB 9.3*  HCT 31.3*  PLT 317   Recent Labs    02/03/21 0426 02/04/21 0503  NA 139 139  K 3.8 3.7  CL 108 106  CO2 23 22  GLUCOSE 111* 117*  BUN 27* 17  CREATININE 1.69* 1.59*  CALCIUM 8.5* 8.8*    Intake/Output Summary (Last 24 hours) at 02/04/2021 1411 Last data filed at 02/04/2021 0500 Gross per 24 hour  Intake 222 ml  Output 1225 ml  Net -1003 ml        Physical Exam: Vital Signs Blood pressure (!) 146/97, pulse (!) 107, temperature 98.5 F (36.9 C), resp. rate 18, height 5\' 10"  (1.778 m), weight (!) 169.6 kg, SpO2 98 %. Gen: no distress, normal appearing HEENT: oral mucosa pink and moist, NCAT Cardio: Tachycardia Chest: normal effort, normal rate of breathing Abd: soft, non-distended Ext: no edema Psych: pleasant, normal affect Skin: intact Musculoskeletal:     Cervical back: Normal range of motion and neck supple.     Comments: No edema or tenderness in extremities  Skin:    General: Skin is warm and dry.  Neurological:     Mental Status: He is alert.     Comments: Alert and oriented x3 and follows commands. Motor: 4/5 throughout  Psychiatric:        Mood and Affect: Affect is blunt.        Behavior: Behavior is slowed.   Assessment/Plan: 1. Functional deficits which require 3+ hours per day of interdisciplinary therapy in a comprehensive inpatient rehab setting.  Physiatrist is providing close team supervision and 24 hour management of active medical problems listed below.  Physiatrist and rehab team continue to assess barriers to discharge/monitor patient progress toward functional and medical goals  Care  Tool:  Bathing    Body parts bathed by patient: Chest,Abdomen,Front perineal area,Face,Left arm   Body parts bathed by helper: Right arm,Buttocks,Right upper leg,Left upper leg,Right lower leg,Left lower leg     Bathing assist Assist Level: Total Assistance - Patient < 25%     Upper Body Dressing/Undressing Upper body dressing   What is the patient wearing?: Hospital gown only    Upper body assist Assist Level: Maximal Assistance - Patient 25 - 49%    Lower Body Dressing/Undressing Lower body dressing      What is the patient wearing?: Incontinence brief     Lower body assist Assist for lower body dressing: Dependent - Patient 0%     Toileting Toileting    Toileting assist Assist for toileting: Dependent - Patient 0%     Transfers Chair/bed transfer  Transfers assist     Chair/bed transfer assist level: 2 Helpers     Locomotion Ambulation   Ambulation assist              Walk 10 feet activity   Assist  Walk 50 feet activity   Assist           Walk 150 feet activity   Assist           Walk 10 feet on uneven surface  activity   Assist           Wheelchair     Assist               Wheelchair 50 feet with 2 turns activity    Assist            Wheelchair 150 feet activity     Assist          Blood pressure (!) 146/97, pulse (!) 107, temperature 98.5 F (36.9 C), resp. rate 18, height 5\' 10"  (1.778 m), weight (!) 169.6 kg, SpO2 98 %.    Medical Problem List and Plan: 1.  Debility secondary to COVID-19 with course complicated by MI/STEMI as well as AKI             -patient may shower             -ELOS/Goals: 14-17 days/supervision/min a             Initial CIR evals today 2.  Antithrombotics: -DVT/anticoagulation: Continue Lovenox 82.5 mg daily             -antiplatelet therapy: Aspirin 81 mg daily and Plavix 85 mg daily 3. Pain Management: Continue Oxycodone as needed. Add  kpad for right sided upper back muscular pain.              By with increased exertion 4. Mood: Provide emotional support             -antipsychotic agents: N/A 5. Neuropsych: This patient is capable of making decisions on his own behalf. 6. Skin/Wound Care: Routine skin checks 7. Fluids/Electrolytes/Nutrition: Routine in and outs 8.  AKI.  Renal function improved.  CRRT discontinued.  Follow-up renal services             Cr improving 2/24, continue to monitor weekly. 9.  COVID-19.  Patient completed course of remdesivir as well as steroids.  Contact precautions discontinued 10.  Super obesity.  BMI 53.52 dietary follow-up 11.  Newly diagnosed uncontrolled diabetes mellitus.  Hemoglobin A1c 13.6.  Continue NovoLog 8 units 3 times daily, Levemir 37 units daily             Monitor with increased mobility 12.  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 tach.  Cardiology follow-up.  Cardiac catheterization 01/20/2021 no significant CAD.               Metoprolol 50 mg twice daily. 13.  Hyperlipidemia: Lipitor  LOS: 1 days A FACE TO FACE EVALUATION WAS PERFORMED  03/20/2021 P Leotha Voeltz 02/04/2021, 2:11 PM

## 2021-02-04 NOTE — Plan of Care (Signed)
  Problem: RH Balance Goal: LTG: Patient will maintain dynamic sitting balance (OT) Description: LTG:  Patient will maintain dynamic sitting balance with assistance during activities of daily living (OT) Flowsheets (Taken 02/04/2021 1245) LTG: Pt will maintain dynamic sitting balance during ADLs with: Supervision/Verbal cueing Goal: LTG Patient will maintain dynamic standing with ADLs (OT) Description: LTG:  Patient will maintain dynamic standing balance with assist during activities of daily living (OT)  Flowsheets (Taken 02/04/2021 1245) LTG: Pt will maintain dynamic standing balance during ADLs with: Supervision/Verbal cueing   Problem: Sit to Stand Goal: LTG:  Patient will perform sit to stand in prep for activites of daily living with assistance level (OT) Description: LTG:  Patient will perform sit to stand in prep for activites of daily living with assistance level (OT) Flowsheets (Taken 02/04/2021 1245) LTG: PT will perform sit to stand in prep for activites of daily living with assistance level: Supervision/Verbal cueing   Problem: RH Grooming Goal: LTG Patient will perform grooming w/assist,cues/equip (OT) Description: LTG: Patient will perform grooming with assist, with/without cues using equipment (OT) Flowsheets (Taken 02/04/2021 1245) LTG: Pt will perform grooming with assistance level of: Supervision/Verbal cueing   Problem: RH Bathing Goal: LTG Patient will bathe all body parts with assist levels (OT) Description: LTG: Patient will bathe all body parts with assist levels (OT) Flowsheets (Taken 02/04/2021 1245) LTG: Pt will perform bathing with assistance level/cueing: Contact Guard/Touching assist   Problem: RH Dressing Goal: LTG Patient will perform upper body dressing (OT) Description: LTG Patient will perform upper body dressing with assist, with/without cues (OT). Flowsheets (Taken 02/04/2021 1245) LTG: Pt will perform upper body dressing with assistance level of:  Supervision/Verbal cueing Goal: LTG Patient will perform lower body dressing w/assist (OT) Description: LTG: Patient will perform lower body dressing with assist, with/without cues in positioning using equipment (OT) Flowsheets (Taken 02/04/2021 1245) LTG: Pt will perform lower body dressing with assistance level of: Contact Guard/Touching assist   Problem: RH Toileting Goal: LTG Patient will perform toileting task (3/3 steps) with assistance level (OT) Description: LTG: Patient will perform toileting task (3/3 steps) with assistance level (OT)  Flowsheets (Taken 02/04/2021 1245) LTG: Pt will perform toileting task (3/3 steps) with assistance level: Minimal Assistance - Patient > 75%   Problem: RH Functional Use of Upper Extremity Goal: LTG Patient will use RT/LT upper extremity as a (OT) Description: LTG: Patient will use right/left upper extremity as a stabilizer/gross assist/diminished/nondominant/dominant level with assist, with/without cues during functional activity (OT) Flowsheets (Taken 02/04/2021 1245) LTG: Use of upper extremity in functional activities: LUE as diminished level   Problem: RH Toilet Transfers Goal: LTG Patient will perform toilet transfers w/assist (OT) Description: LTG: Patient will perform toilet transfers with assist, with/without cues using equipment (OT) Flowsheets (Taken 02/04/2021 1245) LTG: Pt will perform toilet transfers with assistance level of: Supervision/Verbal cueing   Problem: RH Tub/Shower Transfers Goal: LTG Patient will perform tub/shower transfers w/assist (OT) Description: LTG: Patient will perform tub/shower transfers with assist, with/without cues using equipment (OT) Flowsheets (Taken 02/04/2021 1245) LTG: Pt will perform tub/shower stall transfers with assistance level of: Contact Guard/Touching assist

## 2021-02-04 NOTE — Evaluation (Signed)
Occupational Therapy Assessment and Plan  Patient Details  Name: Erik [name] MRN: [id number] Date of Birth: June 06, 1991  OT Diagnosis: muscle weakness (generalized) and decreased cardiorespiratory endurance Rehab Potential: Rehab Potential (ACUTE ONLY): Good ELOS: 21-24 days   Today's Date: 02/04/2021 OT Individual Time: 6644-0347 OT Individual Time Calculation (min): 56 min     Hospital Problem: Principal Problem:   Debility   Past Medical History:  Past Medical History:  Diagnosis Date  . Asthma   . Diabetes mellitus without complication [facility])    Past Surgical History:  Past Surgical History:  Procedure Laterality Date  . LEFT HEART CATH AND CORONARY ANGIOGRAPHY N/A 01/20/2021   Procedure: LEFT HEART CATH AND CORONARY ANGIOGRAPHY;  Surgeon: Nelva Bush, MD;  Location: [place] [facility];  Service: Cardiovascular;  Laterality: N/A;    Assessment & Plan Clinical Impression: Patient is a 30 y.o. year old male with recent admission to the hospital on 01/12/2021 with progressive weakness, poor p.o. intake, lethargy x2 weeks. Denied any fever. Patient with bouts of nausea vomiting. Chest x-ray showed cardiomegaly. No pneumothorax. Bilateral pulmonary infiltrate/edema admission chemistry sodium 142, potassium 5.7, glucose 802 BUN 74 creatinine 3.06 total bilirubin 1.8, WBC 17,300, hemoglobin 15.8, lactic acid 3.3, blood cultures no growth to date, hemoglobin A1c 13.6 SARS coronavirus positive. Patient was placed on intravenous remdesivir as well as Decadron. Airborne and contact precautions implemented and discontinued on 02/01/2021 and patient currently maintained on room air. Renal ultrasound showed no obstructive uropathy. Patient did initially require intubation for airway protection. Renal service was consulted and CRRT initiated. Hospital course further complicated by increasing chest pain on 01/19/2021. EKG SHOWING MI/STEMI as well as episode of sustained ventricular tachycardia taken  to the cardiac Cath Lab on 01/20/2021 that showed no significant CAD suspect myocarditis and continues to be followed by Dr. Harrell Gave End. Echocardiogram with ejection fraction of 60-65%. Patient was placed on metoprolol as well as low-dose aspirin and Plavix. Lovenox ongoing for DVT prophylaxis. Insulin initiate as well as follow-up diabetic coordinator for diabetes mellitus. AKI has resolved CRRT discontinued latest creatinine 1.69.  Patient transferred to CIR on 02/03/2021 .    Patient currently requires total with basic self-care skills secondary to muscle weakness, decreased cardiorespiratoy endurance and decreased standing balance, decreased postural control and decreased balance strategies.  Prior to hospitalization, patient could complete BADL with independent .  Patient will benefit from skilled intervention to increase independence with basic self-care skills prior to discharge home with care partner.  Anticipate patient will require 24 hour supervision and follow up home health.  OT - End of Session Endurance Deficit: Yes Endurance Deficit Description: Rest breaks within BADL Tasks OT Assessment Rehab Potential (ACUTE ONLY): Good OT Patient demonstrates impairments in the following area(s): Balance;Endurance;Motor;Pain;Safety OT Basic ADL's Functional Problem(s): Grooming;Bathing;Dressing;Toileting OT Transfers Functional Problem(s): Toilet;Tub/Shower OT Additional Impairment(s): Fuctional Use of Upper Extremity OT Plan OT Intensity: Minimum of 1-2 x/day, 45 to 90 minutes OT Frequency: 5 out of 7 days OT Duration/Estimated Length of Stay: 21-24 days OT Treatment/Interventions: Balance/vestibular training;Cognitive remediation/compensation;Community reintegration;Discharge planning;Disease mangement/prevention;DME/adaptive equipment instruction;Functional electrical stimulation;Functional mobility training;Patient/family education;Neuromuscular re-education;Pain management;Psychosocial  support;Splinting/orthotics;Skin care/wound managment;Self Care/advanced ADL retraining;Therapeutic Exercise;Therapeutic Activities;UE/LE Strength taining/ROM;Visual/perceptual remediation/compensation;UE/LE Coordination activities;Wheelchair propulsion/positioning OT Basic Self-Care Anticipated Outcome(s): Supervision/CGA OT Toileting Anticipated Outcome(s): Min A OT Bathroom Transfers Anticipated Outcome(s): Supervision OT Recommendation Patient destination: Home Follow Up Recommendations: Home health OT Equipment Recommended: To be determined   OT Evaluation Precautions/Restrictions  Precautions Precautions: Fall Restrictions Weight Bearing Restrictions: No Pain  Pain Assessment Pain Scale: 0-10 Pain Score: 0-No pain Pain Type: Acute pain Pain Location: Back Pain Frequency: Occasional Pain Onset: On-going Pain Intervention(s): Medication (See eMAR) Home Living/Prior Valentine expects to be discharged to:: Private residence Living Arrangements: Parent,Other relatives Available Help at Discharge: Family Type of Home: Apartment Home Access: Elevator Home Layout: One level Bathroom Shower/Tub: Chiropodist: Standard  Lives With: Family (Mom) Prior Function Level of Independence: Independent with homemaking with ambulation,Independent with basic ADLs Comments: Enjoys playing vidoe games, Vision Baseline Vision/History: No visual deficits Patient Visual Report: No change from baseline Perception  Perception: Within Functional Limits Praxis Praxis: Intact Cognition Overall Cognitive Status: Within Functional Limits for tasks assessed Arousal/Alertness: Awake/alert Orientation Level: Person;Place;Situation Person: Oriented Place: Oriented Situation: Oriented Year: 2022 Month: February Day of Week: Correct Memory: Appears intact Immediate Memory Recall: Sock;Blue Memory Recall Sock: With Cue Memory Recall Blue:  Without Cue Memory Recall Bed: With Cue Safety/Judgment: Appears intact Sensation Sensation Light Touch: Appears Intact Coordination Gross Motor Movements are Fluid and Coordinated: No Fine Motor Movements are Fluid and Coordinated: No Coordination and Movement Description: Limited by generalized weakness Motor  Motor Motor: Within Functional Limits  Trunk/Postural Assessment  Cervical Assessment Cervical Assessment: Exceptions to [facility] (forward head) Thoracic Assessment Thoracic Assessment: Exceptions to [facility] (rounded shoulders) Lumbar Assessment Lumbar Assessment: Exceptions to [facility] (posterior pelvic tilt) Postural Control Postural Control: Within Functional Limits  Balance Balance Balance Assessed: Yes Static Sitting Balance Static Sitting - Balance Support: Feet supported Static Sitting - Level of Assistance: 5: Stand by assistance Dynamic Sitting Balance Dynamic Sitting - Balance Support: Feet supported Dynamic Sitting - Level of Assistance: 5: Stand by assistance Static Standing Balance Static Standing - Balance Support: Bilateral upper extremity supported Static Standing - Level of Assistance: 3: Mod assist Dynamic Standing Balance Dynamic Standing - Balance Support: Bilateral upper extremity supported Dynamic Standing - Level of Assistance: 2: Max assist Extremity/Trunk Assessment RUE Assessment RUE Assessment: Within Functional Limits LUE Assessment LUE Assessment: Exceptions to [facility] Passive Range of Motion (PROM) Comments: [facility] General Strength Comments: Limited L shoulder flexion to 60 degrees  Care Tool Care Tool Self Care Eating   Eating Assist Level: Set up assist    Oral Care    Oral Care Assist Level: Set up assist    Bathing   Body parts bathed by patient: Chest;Abdomen;Front perineal area;Face;Left arm Body parts bathed by helper: Right arm;Buttocks;Right upper leg;Left upper leg;Right lower leg;Left lower leg   Assist Level: Total Assistance -  Patient < 25%    Upper Body Dressing(including orthotics)   What is the patient wearing?: Hospital gown only   Assist Level: Maximal Assistance - Patient 25 - 49%    Lower Body Dressing (excluding footwear)   What is the patient wearing?: Incontinence brief Assist for lower body dressing: Dependent - Patient 0%    Putting on/Taking off footwear   What is the patient wearing?: Non-skid slipper socks Assist for footwear: Dependent - Patient 0%       Care Tool Toileting Toileting activity   Assist for toileting: Dependent - Patient 0%     Care Tool Bed Mobility Roll left and right activity   Roll left and right assist level: Minimal Assistance - Patient > 75%    Sit to lying activity   Sit to lying assist level: Maximal Assistance - Patient 25 - 49%    Lying to sitting edge of bed activity   Lying to sitting edge of  bed assist level: Maximal Assistance - Patient 25 - 49%     Care Tool Transfers Sit to stand transfer   Sit to stand assist level: Total Assistance - Patient < 25% (Stedy)    Chair/bed transfer   Chair/bed transfer assist level: Total Assistance - Patient < 25% (Stedy)     Materials engineer transfer activity did not occur: Safety/medical concerns       Care Tool Cognition Expression of Ideas and Wants Expression of Ideas and Wants: Without difficulty (complex and basic) - expresses complex messages without difficulty and with speech that is clear and easy to understand   Understanding Verbal and Non-Verbal Content Understanding Verbal and Non-Verbal Content: Understands (complex and basic) - clear comprehension without cues or repetitions   Memory/Recall Ability *first 3 days only Memory/Recall Ability *first 3 days only: Current season;Location of own room;Staff names and faces;That he or she is in a hospital/hospital unit    Refer to Care Plan for [place] 1 OT Short Term Goal 1 (Week 1): Patient will complete  sit<>stand with max A of 1 person OT Short Term Goal 2 (Week 1): Patient will maintain standing in [place] for 1 minute in prep for BADL task OT Short Term Goal 3 (Week 1): Pt will complete 1 step up LB dressing task  Recommendations for other services: None    Skilled Therapeutic Intervention Pt greeted semi-reclined in bed and agreeable to OT eval and treat. Pt completed bed mobility with max A to elevate trunk. Pt then tolerated sitting EOB for 20 minutes while performing UB BADLs. Pt with limited shoulder ROM on R side limiting reach. Worked on sit<>stand at [employer] using bariatric Stedy. Pt unable to achieve hip and trunk extension after 3 trials, but on 4th trial and bed raised way up, Pt was able to stand with max/total A. Pt able to stand long enough to get Stedy padles under his bottom. Pt tolerated perch in [place] for 5 minutes, then reported max fatigue and did not think he could stand again. With max encouragement, pt able to power up for brief period for OT to remove Stedy padles and return him to sitting. Pt needed max A to return to supine. Pt reported need for bed pan and rolled with min A for bed pan placement. Pt with small BM and needed total A for peri-care and brief change. OT eval completed addressing rehab process, OT purpose, POC, ELOS, and goals. Pt left semi-reclined in bed with alarm on, nursing present, and needs met. See below for further details on BADL performance.  ADL ADL Eating: Set up Grooming: Setup Upper Body Bathing: Moderate assistance Lower Body Bathing: Dependent Upper Body Dressing: Maximal assistance Lower Body Dressing: Dependent Toileting: Dependent Mobility  Bed Mobility Bed Mobility: Sit to Supine;Supine to Sit Supine to Sit: Moderate Assistance - Patient 50-74% Sit to Supine: Moderate Assistance - Patient 50-74% Transfers Sit to Stand: Total Assistance - Patient < 25% Stand to Sit: Total Assistance - Patient < 25%   Discharge Criteria: Patient will  be discharged from OT if patient refuses treatment 3 consecutive times without medical reason, if treatment goals not met, if there is a change in medical status, if patient makes no progress towards goals or if patient is discharged from hospital.  The above assessment, treatment plan, treatment alternatives and goals were discussed and mutually agreed upon: by patient  Valma Cava 02/04/2021, 4:09 PM

## 2021-02-04 NOTE — Evaluation (Signed)
Physical Therapy Assessment and Plan  Patient Details  Name: Erik [name] MRN: [id number] Date of Birth: [birth year]  PT Diagnosis: Difficulty walking and Muscle weakness Rehab Potential: Good ELOS: 3-3.5 weeks   Today's Date: 02/04/2021 PT Individual Time: 1304-1400 PT Individual Time Calculation (min): 56 min    Hospital Problem: Principal Problem:   Debility   Past Medical History:  Past Medical History:  Diagnosis Date  . Asthma   . Diabetes mellitus without complication [facility])    Past Surgical History:  Past Surgical History:  Procedure Laterality Date  . LEFT HEART CATH AND CORONARY ANGIOGRAPHY N/A 01/20/2021   Procedure: LEFT HEART CATH AND CORONARY ANGIOGRAPHY;  Surgeon: Nelva Bush, MD;  Location: [facility];  Service: Cardiovascular;  Laterality: N/A;    Assessment & Plan Clinical Impression: Patient is a 30 year old right-handed male with unremarkable past medical history except obesity with BMI 49.64 on no prescription medications and is not followed by a medical provider.  History taken from chart review and patient.  Patient lives with parent.  Multilevel home bed and bath upstairs.  Independent prior to admission.  He presented on 01/12/2021 with progressive weakness, poor p.o. intake, lethargy x2 weeks.  Denied any fever.  Patient with bouts of nausea vomiting.  Chest x-ray showed cardiomegaly.  No pneumothorax.  Bilateral pulmonary infiltrate/edema admission chemistry sodium 142, potassium 5.7, glucose 802 BUN 74 creatinine 3.06 total bilirubin 1.8, WBC 17,300, hemoglobin 15.8, lactic acid 3.3, blood cultures no growth to date, hemoglobin A1c 13.6 SARS coronavirus positive.  Patient was placed on intravenous remdesivir as well as Decadron.  Airborne and contact precautions implemented and discontinued on 02/01/2021 and patient currently maintained on room air.  Renal ultrasound showed no obstructive uropathy.  Patient did initially require intubation for airway  protection.  Renal service was consulted and CRRT initiated.  Hospital course further complicated by increasing chest pain on 01/19/2021.  EKG SHOWING MI/STEMI as well as episode of sustained ventricular tachycardia taken to the cardiac Cath Lab on 01/20/2021 that showed no significant CAD suspect myocarditis and continues to be followed by Dr. Harrell Gave End.  Echocardiogram with ejection fraction of 60-65%.  Patient was placed on metoprolol as well as low-dose aspirin and Plavix.  Lovenox ongoing for DVT prophylaxis.  Insulin initiate as well as follow-up diabetic coordinator for diabetes mellitus.  AKI has resolved CRRT discontinued latest creatinine 1.69.  He is tolerating a regular diet.  Patient transferred to CIR on 02/03/2021 .   Patient currently requires max with mobility secondary to muscle weakness and decreased cardiorespiratoy endurance.  Prior to hospitalization, patient was independent  with mobility and lived with  (Mom) in a [place] home.  Home access is  Elevator.  Patient will benefit from skilled PT intervention to maximize safe functional mobility, minimize fall risk and decrease caregiver burden for planned discharge home with intermittent assist.  Anticipate patient will benefit from follow up [facility] at discharge.  PT - End of Session Activity Tolerance: Tolerates 30+ min activity with multiple rests Endurance Deficit: Yes PT Assessment Rehab Potential (ACUTE/IP ONLY): Good PT Patient demonstrates impairments in the following area(s): Balance;Endurance;Motor;Pain;Perception;Safety;Skin Integrity PT Transfers Functional Problem(s): Bed Mobility;Bed to Chair;Car;Furniture PT Locomotion Functional Problem(s): Ambulation;Stairs PT Plan PT Intensity: Minimum of 1-2 x/day ,45 to 90 minutes PT Frequency: 5 out of 7 days PT Duration Estimated Length of Stay: 3-3.5 weeks PT Treatment/Interventions: Ambulation/gait training;Community reintegration;DME/adaptive equipment  instruction;Neuromuscular re-education;Psychosocial support;Stair training;UE/LE Strength taining/ROM;Wheelchair propulsion/positioning;UE/LE Coordination activities;Therapeutic Activities;Skin care/wound management;Pain management;Functional  electrical stimulation;Discharge planning;Balance/vestibular training;Cognitive remediation/compensation;Disease management/prevention;Functional mobility training;Patient/family education;Splinting/orthotics;Therapeutic Exercise;Visual/perceptual remediation/compensation PT Transfers Anticipated Outcome(s): Supervision PT Locomotion Anticipated Outcome(s): Supervisoin PT Recommendation Follow Up Recommendations: Home health PT Patient destination: Home Equipment Recommended: To be determined   PT Evaluation Precautions/Restrictions Precautions Precautions: Fall Restrictions Weight Bearing Restrictions: No General Chart Reviewed: Yes Family/Caregiver Present: No Vital SignsOxygen Therapy SpO2: 98 % O2 Device: Room Air FiO2 (%): 21 % Pain Pain Assessment Pain Scale: 0-10 Pain Score: 0-No pain Pain Type: Acute pain Pain Location: Back Pain Frequency: Occasional Pain Onset: On-going Pain Intervention(s): Medication (See eMAR) Home Living/Prior Functioning Home Living Available Help at Discharge: Family Type of Home: Apartment Home Access: Elevator  Lives With:  (Mom) Prior Function Comments: Indep with ADLs, household and community mobilization without assist device; denies fall history; no home O2; enjoys video games. Vision/Perception  Perception Perception: Within Functional Limits Praxis Praxis: Intact  Cognition Overall Cognitive Status: Within Functional Limits for tasks assessed Arousal/Alertness: Awake/alert Orientation Level: Oriented X4 Safety/Judgment: Appears intact Sensation Sensation Light Touch: Appears Intact Coordination Gross Motor Movements are Fluid and Coordinated: No Fine Motor Movements are Fluid and  Coordinated: No Coordination and Movement Description: Limited by generalized weakness Motor  Motor Motor: Within Functional Limits  Trunk/Postural Assessment  Cervical Assessment Cervical Assessment: Exceptions to [facility] (forward head) Thoracic Assessment Thoracic Assessment: Exceptions to [facility] (rounded shoulders) Lumbar Assessment Lumbar Assessment: Exceptions to [facility] (posterior pelvic tilt) Postural Control Postural Control: Within Functional Limits  Balance Balance Balance Assessed: Yes Static Sitting Balance Static Sitting - Balance Support: Feet supported Static Sitting - Level of Assistance: 5: Stand by assistance Dynamic Sitting Balance Dynamic Sitting - Balance Support: Feet supported Dynamic Sitting - Level of Assistance: 5: Stand by assistance Static Standing Balance Static Standing - Balance Support: Bilateral upper extremity supported Static Standing - Level of Assistance: 3: Mod assist Dynamic Standing Balance Dynamic Standing - Balance Support: Bilateral upper extremity supported Dynamic Standing - Level of Assistance: 3: Mod assist Extremity Assessment  RLE Assessment RLE Assessment: Exceptions to [facility] Passive Range of Motion (PROM) Comments: Limited by body habitus General Strength Comments: Grossly 3/5 LLE Assessment LLE Assessment: Exceptions to [facility] Passive Range of Motion (PROM) Comments: Limited by body habitus General Strength Comments: Grossly 3/5  Care Tool Care Tool Bed Mobility Roll left and right activity   Roll left and right assist level: Minimal Assistance - Patient > 75%    Sit to lying activity   Sit to lying assist level: Maximal Assistance - Patient 25 - 49%    Lying to sitting edge of bed activity   Lying to sitting edge of bed assist level: Maximal Assistance - Patient 25 - 49%     Care Tool Transfers Sit to stand transfer   Sit to stand assist level: Total Assistance - Patient < 25% (Stedy)    Chair/bed transfer   Chair/bed  transfer assist level: Total Assistance - Patient < 25% (Stedy)     Materials engineer transfer activity did not occur: Safety/medical Personal assistant transfer activity did not occur: Safety/medical concerns        Care Tool Locomotion Ambulation   Assist level: 2 helpers Assistive device: Parallel bars Max distance: 1'  Walk 10 feet activity Walk 10 feet activity did not occur: Safety/medical concerns       Walk 50 feet with 2 turns activity Walk 50 feet with 2 turns activity did not occur: Safety/medical concerns  Walk 150 feet activity Walk 150 feet activity did not occur: Safety/medical concerns      Walk 10 feet on uneven surfaces activity Walk 10 feet on uneven surfaces activity did not occur: Safety/medical concerns      Stairs Stair activity did not occur: Safety/medical concerns        Walk up/down 1 step activity Walk up/down 1 step or curb (drop down) activity did not occur: Safety/medical concerns     Walk up/down 4 steps activity did not occuR: Safety/medical concerns  Walk up/down 4 steps activity      Walk up/down 12 steps activity Walk up/down 12 steps activity did not occur: Safety/medical concerns      Pick up small objects from floor Pick up small object from the floor (from standing position) activity did not occur: Safety/medical concerns      Wheelchair Will patient use wheelchair at discharge?: No          Wheel 50 feet with 2 turns activity      Wheel 150 feet activity        Refer to Care Plan for Long Term Goals  SHORT TERM GOAL WEEK 1 PT Short Term Goal 1 (Week 1): Pt will perform bed mobility at minA PT Short Term Goal 2 (Week 1): Pt will perform bed to chair transfers with modA PT Short Term Goal 3 (Week 1): Pt will perform sit to stand transfer with minA. PT Short Term Goal 4 (Week 1): Pt will ambulate x25' with modA +2.  Recommendations for other services: None   Skilled Therapeutic  Intervention  Evaluation completed (see details above and below) with education on PT POC and goals and individual treatment initiated with focus on bed mobility, balance, transfers, and initiation of gait training. Pt received supine in bed on bed pan. No report of pain. Pt performs bilateral rolling with modA and PT provides totalA for pericare and brief change. Pt performs supine to sit with modA and cues for sequencing and body mechanics. Pt performs sit to stand from elevated EOB with Stedy and cues for anterior weight shift and sequencing. Transfer to [facility] with stedy and totalA. WC transport to gym for time management. Pt performs multiple reps of sit to stand in parallel bars for functional strengthening and transfer training. Initially pt requires maxA but improves to performing with modA. Pt practices lateral weight shifting progressing to marching in place progressing to ambulation x1' with +2 for very close WC follow. Pt has bilateral knee buckling after 1' and sits down in [place]. Upon return to room, pt attempts to stand to [place] several times but is unable to complete. Following rest break, pt attempts again and completes with maxA at trunk and hips. Sit to supine with modA. Left with alarm intact and all needs within reach.   Mobility Bed Mobility Bed Mobility: Sit to Supine;Supine to Sit Supine to Sit: Moderate Assistance - Patient 50-74% Sit to Supine: Moderate Assistance - Patient 50-74% Transfers Transfers: Sit to Stand;Stand to Sit;Stand Pivot Transfers Sit to Stand: Total Assistance - Patient < 25% Stand to Sit: Total Assistance - Patient < 25% Stand Pivot Transfers: Total Assistance - Patient < 25% Stand Pivot Transfer Details: Verbal cues for technique;Verbal cues for precautions/safety;Verbal cues for safe use of DME/AE;Tactile cues for posture;Tactile cues for weight shifting;Tactile cues for initiation Transfer via Lift Equipment: Probation officer Ambulation: Yes Gait  Assistance: 2 Helpers Gait Distance (Feet): 1 Feet Assistive device: Parallel bars Gait Assistance  Details: Tactile cues for initiation;Tactile cues for weight shifting;Tactile cues for placement;Verbal cues for precautions/safety;Verbal cues for sequencing Gait Gait: Yes Gait Pattern: Impaired Gait Pattern:  (bilateral knee buckle after 1') Gait velocity: Decreased Stairs / Additional Locomotion Stairs: No   Discharge Criteria: Patient will be discharged from PT if patient refuses treatment 3 consecutive times without medical reason, if treatment goals not met, if there is a change in medical status, if patient makes no progress towards goals or if patient is discharged from hospital.  The above assessment, treatment plan, treatment alternatives and goals were discussed and mutually agreed upon: by patient  Breck Coons, PT, DPT 02/04/2021, 2:58 PM

## 2021-02-05 LAB — GLUCOSE, CAPILLARY
Glucose-Capillary: 121 mg/dL — ABNORMAL HIGH (ref 70–99)
Glucose-Capillary: 107 mg/dL — ABNORMAL HIGH (ref 70–99)
Glucose-Capillary: 106 mg/dL — ABNORMAL HIGH (ref 70–99)
Glucose-Capillary: 98 mg/dL (ref 70–99)

## 2021-02-05 NOTE — Progress Notes (Signed)
Physical Therapy Session Note  Patient Details  Name: Erik [name] MRN: [id number] Date of Birth: [birth year]  Today's Date: 02/05/2021 PT Individual Time: 9833-8250 PT Individual Time Calculation (min): 57 min   Short Term Goals: Week 1:  PT Short Term Goal 1 (Week 1): Pt will perform bed mobility at minA PT Short Term Goal 2 (Week 1): Pt will perform bed to chair transfers with modA PT Short Term Goal 3 (Week 1): Pt will perform sit to stand transfer with minA. PT Short Term Goal 4 (Week 1): Pt will ambulate x25' with modA +2.  Skilled Therapeutic Interventions/Progress Updates:     Pt received supine in bed and agrees to therapy. Reports cramping pain in both lower legs, likely skeletomuscular in nature. PT provides education and rest breaks to manage pain. Pt performs supine to sit with minA. From slightly elevated EOB, pt stands to [place] with modA +2 and transfers to [facility] with toalA. WC transport to gym for time management. Pt performs multiple reps of sit to stand in parallel bars with modA +1 to encourage anterior weight shift at hips. In standing, pt is able to maintain balance with CGA and practices decreasing upper extremity support on parallel bars to increase strengthening and balance challenge for legs. Pt then progresses to marching in place. After several steps pt buckles in bilateral lower extremities and suddenly sits back on WC. Pt requires +3 assist to shift hips back due to being positioned close to edge of chair. PT educates pt on attempting to take breaks prior to being at point of buckling and pt verbalizes understanding. Pt then ambulates 2x5' witih PT blocking knees and very close WC follow. Pt does not buckle and demos improved eccentric control with stand to sit. WC transport back to room. Pt performs sit to stand to [place] with modA +2 and transfers back to bed. Sit to supine with modA +1 left with alarm intact and all needs within reach.  Therapy Documentation Precautions:   Precautions Precautions: Fall Restrictions Weight Bearing Restrictions: No   Therapy/Group: Individual Therapy  Beau Fanny, PT, DPT 02/05/2021, 3:37 PM

## 2021-02-05 NOTE — Progress Notes (Signed)
Patient ID: Erik [name], male   DOB: Jan 09, 1991, 30 y.o.   MRN: [id number]  SW made contact with pt mother Erik [name] [phone]) to introduce self, explain role, and discuss pt discharge plan. Confirms that pt has been helping his grandfather, however, if other supports needed,there was other family available in the area. She states she goes to dialysis TTS at 6:10am. SW discussed medication assistance program, and pt establishing PCP. Reports previous SW sent a referral but unable to remember name of clinic. Per EMR, referral made in Epic to [facility] for PCP and medication assistance, same resources SW provided. SW encouraged pt mother to follow-up with clinic. SW to provide updates after team conference.   Cecile Sheerer, MSW, LCSWA Office: [phone] Cell: [phone] Fax: [phone]

## 2021-02-05 NOTE — Progress Notes (Signed)
Occupational Therapy Session Note  Patient Details  Name: Erik [name] MRN: [id number] Date of Birth: 01-11-1991  Today's Date: 02/05/2021 OT Individual Time: 1103-1200 OT Individual Time Calculation (min): 57 min   Short Term Goals: Week 1:  OT Short Term Goal 1 (Week 1): Patient will complete sit<>stand with max A of 1 person OT Short Term Goal 2 (Week 1): Patient will maintain standing in [place] for 1 minute in prep for BADL task OT Short Term Goal 3 (Week 1): Pt will complete 1 step up LB dressing task  Skilled Therapeutic Interventions/Progress Updates:    Pt greeted semi-reclined in bed and agreeable to OT treatment session. Pt completed bed mobility with Mod A to elevate trunk. Stedy placed and pt unable to stand with 1 person assist from raised bed. Rehab tech entered and provided +2 assistance for sit<>stand in [place]. Pt then transferred to wc in [place]. Pt brought to dayroom and worked on  B UE strength/coordination with drumming activity. Pt brought through stretching and UB ROM exercises as well. Pt needed guided A for any shoulder flexion past 50 degrees. Pt returned to room and completed sit<>stand in [place] with mod A of 2. Pt needed OT assist to bring LEs back into bed. Pt left semi-reclined in bed with bed alarm on, call bell in reach, and needs met.   Therapy Documentation Precautions:  Precautions Precautions: Fall Restrictions Weight Bearing Restrictions: No Pain: Pain Assessment Pain Scale: 0-10 Pain Score: 0-No pain   Therapy/Group: Individual Therapy  Valma Cava 02/05/2021, 12:25 PM

## 2021-02-05 NOTE — Progress Notes (Signed)
Inpatient Rehabilitation Center Individual Statement of Services  Patient Name:  Erik [name]  Date:  02/05/2021  Welcome to the Inpatient Rehabilitation Center.  Our goal is to provide you with an individualized program based on your diagnosis and situation, designed to meet your specific needs.  With this comprehensive rehabilitation program, you will be expected to participate in at least 3 hours of rehabilitation therapies Monday-Friday, with modified therapy programming on the weekends.  Your rehabilitation program will include the following services:  Physical Therapy (PT), Occupational Therapy (OT), 24 hour per day rehabilitation nursing, Therapeutic Recreaction (TR), Psychology, Neuropsychology, Care Coordinator, Rehabilitation Medicine, Nutrition Services, Pharmacy Services and Other  Weekly team conferences will be held on Tuesdays to discuss your progress.  Your Inpatient Rehabilitation Care Coordinator will talk with you frequently to get your input and to update you on team discussions.  Team conferences with you and your family in attendance may also be held.  Expected length of stay: 3-3.5 weeks    Overall anticipated outcome: Supervision  Depending on your progress and recovery, your program may change. Your Inpatient Rehabilitation Care Coordinator will coordinate services and will keep you informed of any changes. Your Inpatient Rehabilitation Care Coordinator's name and contact numbers are listed  below.  The following services may also be recommended but are not provided by the Inpatient Rehabilitation Center:   Driving Evaluations  Home Health Rehabiltiation Services  Outpatient Rehabilitation Services  Vocational Rehabilitation   Arrangements will be made to provide these services after discharge if needed.  Arrangements include referral to agencies that provide these services.  Your insurance has been verified to be:  Uninsured  Your primary doctor is:  No  PCP  Pertinent information will be shared with your doctor and your insurance company.  Inpatient Rehabilitation Care Coordinator:  Susie Cassette [phone] or ([phone]  Information discussed with and copy given to patient by: Gretchen Short, 02/05/2021, 10:36 AM

## 2021-02-05 NOTE — Progress Notes (Signed)
PROGRESS NOTE   Subjective/Complaints: No complaints this morning Pain has improved with heating pad.   ROS: + upper right sided back pain Objective:   No results found. Recent Labs    02/04/21 0503  WBC 8.8  HGB 9.3*  HCT 31.3*  PLT 317   Recent Labs    02/03/21 0426 02/04/21 0503  NA 139 139  K 3.8 3.7  CL 108 106  CO2 23 22  GLUCOSE 111* 117*  BUN 27* 17  CREATININE 1.69* 1.59*  CALCIUM 8.5* 8.8*    Intake/Output Summary (Last 24 hours) at 02/05/2021 0930 Last data filed at 02/05/2021 0757 Gross per 24 hour  Intake 360 ml  Output 600 ml  Net -240 ml        Physical Exam: Vital Signs Blood pressure 134/89, pulse (!) 105, temperature 98.4 F (36.9 C), resp. rate 18, height 5\' 10"  (1.778 m), weight (!) 169.7 kg, SpO2 99 %. Gen: no distress, normal appearing HEENT: oral mucosa pink and moist, NCAT Cardio: Tachycardic Chest: normal effort, normal rate of breathing Abd: soft, non-distended Ext: no edema Psych: pleasant, normal affect Skin: intact Musculoskeletal:     Cervical back: Normal range of motion and neck supple.     Comments: No edema or tenderness in extremities  Skin:    General: Skin is warm and dry.  Neurological:     Mental Status: He is alert.     Comments: Alert and oriented x3 and follows commands. Motor: 4/5 throughout  Psychiatric:        Mood and Affect: Affect is blunt.        Behavior: Behavior is slowed.   Assessment/Plan: 1. Functional deficits which require 3+ hours per day of interdisciplinary therapy in a comprehensive inpatient rehab setting.  Physiatrist is providing close team supervision and 24 hour management of active medical problems listed below.  Physiatrist and rehab team continue to assess barriers to discharge/monitor patient progress toward functional and medical goals  Care Tool:  Bathing    Body parts bathed by patient: Chest,Abdomen,Front  perineal area,Face,Left arm   Body parts bathed by helper: Right arm,Buttocks,Right upper leg,Left upper leg,Right lower leg,Left lower leg     Bathing assist Assist Level: Total Assistance - Patient < 25%     Upper Body Dressing/Undressing Upper body dressing   What is the patient wearing?: Hospital gown only    Upper body assist Assist Level: Maximal Assistance - Patient 25 - 49%    Lower Body Dressing/Undressing Lower body dressing      What is the patient wearing?: Incontinence brief     Lower body assist Assist for lower body dressing: Dependent - Patient 0%     Toileting Toileting    Toileting assist Assist for toileting: Dependent - Patient 0%     Transfers Chair/bed transfer  Transfers assist  Chair/bed transfer activity did not occur: N/A  Chair/bed transfer assist level: Total Assistance - Patient < 25% (Stedy)     Locomotion Ambulation   Ambulation assist      Assist level: 2 helpers Assistive device: Parallel bars Max distance: 1'   Walk 10 feet activity   Assist  Walk  10 feet activity did not occur: Safety/medical concerns        Walk 50 feet activity   Assist Walk 50 feet with 2 turns activity did not occur: Safety/medical concerns         Walk 150 feet activity   Assist Walk 150 feet activity did not occur: Safety/medical concerns         Walk 10 feet on uneven surface  activity   Assist Walk 10 feet on uneven surfaces activity did not occur: Safety/medical concerns         Wheelchair     Assist Will patient use wheelchair at discharge?: No             Wheelchair 50 feet with 2 turns activity    Assist            Wheelchair 150 feet activity     Assist          Blood pressure 134/89, pulse (!) 105, temperature 98.4 F (36.9 C), resp. rate 18, height 5\' 10"  (1.778 m), weight (!) 169.7 kg, SpO2 99 %.    Medical Problem List and Plan: 1.  Debility secondary to COVID-19 with  course complicated by MI/STEMI as well as AKI             -patient may shower             -ELOS/Goals: 14-17 days/supervision/min a            Continue CIR- therapy notes reviewed- performed 34ft ambulation with mod-maxA  2.  Antithrombotics: -DVT/anticoagulation: Continue Lovenox 82.5 mg daily             -antiplatelet therapy: Aspirin 81 mg daily and Plavix 85 mg daily 3. Pain Management: Continue Oxycodone as needed. Add kpad for right sided upper back muscular pain.              By with increased exertion 4. Mood: Provide emotional support             -antipsychotic agents: N/A 5. Neuropsych: This patient is capable of making decisions on his own behalf. 6. Skin/Wound Care: Routine skin checks 7. Fluids/Electrolytes/Nutrition: Routine in and outs 8.  AKI.  Renal function improved.  CRRT discontinued.  Follow-up renal services             Cr improving 2/24, continue to monitor weekly. 9.  COVID-19.  Patient completed course of remdesivir as well as steroids.  Contact precautions discontinued 10.  Super obesity.  BMI 53.52 dietary follow-up 11.  Newly diagnosed uncontrolled diabetes mellitus.  Hemoglobin A1c 13.6.  Continue NovoLog 8 units 3 times daily, Levemir 37 units daily             Monitor with increased mobility 12.  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 tach.  Cardiology follow-up.  Cardiac catheterization 01/20/2021 no significant CAD.               Continue Metoprolol 50 mg twice daily. 13.  Hyperlipidemia: Lipitor  LOS: 2 days A FACE TO FACE EVALUATION WAS PERFORMED  Bently Morath P Libia Fazzini 02/05/2021, 9:30 AM

## 2021-02-05 NOTE — Plan of Care (Signed)
  Problem: RH BOWEL ELIMINATION Goal: RH STG MANAGE BOWEL WITH ASSISTANCE Description: STG Manage Bowel with Mod Assistance. Outcome: Progressing   Problem: RH BLADDER ELIMINATION Goal: RH STG MANAGE BLADDER WITH ASSISTANCE Description: STG Manage Bladder With Mod Assistance Outcome: Progressing   Problem: RH SKIN INTEGRITY Goal: RH STG SKIN FREE OF INFECTION/BREAKDOWN Description: Skin will remain free of infection or breakdown with mod assist. Outcome: Progressing   Problem: RH PAIN MANAGEMENT Goal: RH STG PAIN MANAGED AT OR BELOW PT'S PAIN GOAL Description: Patient will remain with pain less than 2 during stay.  Outcome: Progressing   Problem: Consults Goal: RH GENERAL PATIENT EDUCATION Description: See Patient Education module for education specifics. Outcome: Progressing   

## 2021-02-06 LAB — GLUCOSE, CAPILLARY
Glucose-Capillary: 131 mg/dL — ABNORMAL HIGH (ref 70–99)
Glucose-Capillary: 110 mg/dL — ABNORMAL HIGH (ref 70–99)
Glucose-Capillary: 134 mg/dL — ABNORMAL HIGH (ref 70–99)
Glucose-Capillary: 150 mg/dL — ABNORMAL HIGH (ref 70–99)

## 2021-02-06 NOTE — Progress Notes (Signed)
Physical Therapy Session Note  Patient Details  Name: Erik [name] MRN: [id number] Date of Birth: [birth year]  Today's Date: 02/06/2021 PT Individual Time: 1100-1139 PT Individual Time Calculation (min): 39 min   Short Term Goals: Week 1:  PT Short Term Goal 1 (Week 1): Pt will perform bed mobility at minA PT Short Term Goal 2 (Week 1): Pt will perform bed to chair transfers with modA PT Short Term Goal 3 (Week 1): Pt will perform sit to stand transfer with minA. PT Short Term Goal 4 (Week 1): Pt will ambulate x25' with modA +2.  Skilled Therapeutic Interventions/Progress Updates:    pt received in bed and agreeable to therapy. Pt directed in bed mobility supine>sit min A for trunk support. Pt then required stedy for transfer to [facility], mod A to rise from elevated bed height to stedy. Pt then taken to gym total A for time and energy. Pt transferred to mat table with stedy min A to stand from [facility] level to stedy, then directed in 2x20 ball toss from EOM trunk unsupported with intermittent assistance with LUE support however pt able to complete well without LOB and required rest breaks post each set 2/2 fatigue. Pt then directed in core strengthening exercise 2x15 modified crunch with yoga ball support at back. Pt then reported he felt fatigued and with sitting he felt his back was beginning to hurt more. 5/10 per pt, post rest break 4/10. Pt then agreeable to attempt standing at stedy, mod A from mat table to stedy completed x2 and static standing each time for ~35s before rest break needed, knee support required with stedy. Pt then reported he was very tired and wanted to rest in [facility], transferred with stedy total A. Pt rested in [facility] for short time and reported his back pain at worse to 5-6/10 and requested to rest in bed. Pt taken back to room in [facility] total A and stedy used to transfer to bedside, mod A to stand from [facility] to stedy. Pt then required mod A for sit>supine for BLE management to supine. Max A x2 with  inverted bed to scoot upward for improved positioning. Pt then returned to semi-inclined bed position and left in bed, All needs in reach and in good condition. Call light in hand.  And alarm set. Pt denied nursing needs and very polite and apologetic but reported he couldn't do more therapy and needed to rest.   Therapy Documentation Precautions:  Precautions Precautions: Fall Restrictions Weight Bearing Restrictions: No General: PT Amount of Missed Time (min): 21 Minutes PT Missed Treatment Reason: Pain;Patient fatigue Vital Signs: Therapy Vitals Temp: 98.7 F (37.1 C) Pulse Rate: (!) 102 Resp: 18 BP: 129/88 Patient Position (if appropriate): Lying Oxygen Therapy SpO2: 100 % O2 Device: Room Air Pain:   Mobility:   Locomotion :    Trunk/Postural Assessment :    Balance:   Exercises:   Other Treatments:      Therapy/Group: Individual Therapy  Barbaraann Faster 02/06/2021, 1:39 PM

## 2021-02-06 NOTE — IPOC Note (Signed)
Overall Plan of Care [facility]) Patient Details Name: Erik [name] MRN: [id number] DOB: 05/16/1991  Admitting Diagnosis: Debility  Hospital Problems: Principal Problem:   Debility     Functional Problem List: Nursing Bladder,Bowel,Skin Integrity,Pain,Safety  PT Balance,Endurance,Motor,Pain,Perception,Safety,Skin Integrity  OT Balance,Endurance,Motor,Pain,Safety  SLP    TR         Basic ADL's: OT Grooming,Bathing,Dressing,Toileting     Advanced  ADL's: OT       Transfers: PT Bed Mobility,Bed to Chair,Car,Furniture  OT Toilet,Tub/Shower     Locomotion: PT Ambulation,Stairs     Additional Impairments: OT Fuctional Use of Upper Extremity  SLP        TR      Anticipated Outcomes Item Anticipated Outcome  Self Feeding    Swallowing      Basic self-care  Supervision/CGA  Toileting  Min A   Bathroom Transfers Supervision  Bowel/Bladder  Patient will call for bedpad or urnial when needed.  Transfers  Supervision  Locomotion  Supervisoin  Communication     Cognition     Pain  Patient will remain pain free while hospitalized.  Safety/Judgment  Patient will remain free of falls during hospitalization   Therapy Plan: PT Intensity: Minimum of 1-2 x/day ,45 to 90 minutes PT Frequency: 5 out of 7 days PT Duration Estimated Length of Stay: 3-3.5 weeks OT Intensity: Minimum of 1-2 x/day, 45 to 90 minutes OT Frequency: 5 out of 7 days OT Duration/Estimated Length of Stay: 21-24 days     Due to the current state of emergency, patients may not be receiving their 3-hours of Medicare-mandated therapy.   Team Interventions: Nursing Interventions Bladder Management,Bowel Management,Pain Management,Skin Care/Wound Management  PT interventions Ambulation/gait training,Community reintegration,DME/adaptive equipment instruction,Neuromuscular re-education,Psychosocial support,Stair training,UE/LE Strength taining/ROM,Wheelchair propulsion/positioning,UE/LE Coordination  activities,Therapeutic Activities,Skin care/wound management,Pain management,Functional electrical stimulation,Discharge planning,Balance/vestibular training,Cognitive remediation/compensation,Disease management/prevention,Functional mobility training,Patient/family education,Splinting/orthotics,Therapeutic Exercise,Visual/perceptual remediation/compensation  OT Interventions Balance/vestibular training,Cognitive remediation/compensation,Community reintegration,Discharge planning,Disease Psychologist, prison and probation services stimulation,Functional mobility training,Patient/family education,Neuromuscular re-education,Pain management,Psychosocial support,Splinting/orthotics,Skin care/wound managment,Self Care/advanced ADL retraining,Therapeutic Exercise,Therapeutic Activities,UE/LE Strength taining/ROM,Visual/perceptual remediation/compensation,UE/LE Psychiatrist propulsion/positioning  SLP Interventions    TR Interventions    SW/CM Interventions Discharge Planning,Psychosocial Support,Patient/Family Education   Barriers to Discharge MD  Medical stability  Nursing New diabetic,Inaccessible home environment,Decreased caregiver support,Home environment access/layout,Incontinence,Lack of/limited family support,Medication compliance,Behavior,Nutrition means    PT      OT      SLP      SW Decreased caregiver support,Lack of/limited family support Pt is uninsured.   Team Discharge Planning: Destination: PT-Home ,OT- Home , SLP-  Projected Follow-up: PT-Home health PT, OT-  Home health OT, SLP-  Projected Equipment Needs: PT-To be determined, OT- To be determined, SLP-  Equipment Details: PT- , OT-  Patient/family involved in discharge planning: PT- Patient,  OT-Patient, SLP-   MD ELOS: 14-17 days S/MinA Medical Rehab Prognosis:  Excellent Assessment: Mr. Devine is a 30 year old man who is aditted to CIR with debility secondary to  COVID-19. His course was complicated by MI/STEMi and AKI. He was started on weight adjusted Lovenox of 82.5mg  daily given BMI 53.14. He is continued on Aspirin and Plavix for antiplatelet therapy. Kpad was started for right upper back muscular pain. Creatinine has been monitored and is improving. He is started on Levemir and Novolog for newly diagnosed diabetes.    See Team Conference Notes for weekly updates to the plan of care

## 2021-02-06 NOTE — Progress Notes (Signed)
Physical Therapy Session Note  Patient Details  Name: Erik [name] MRN: [id number] Date of Birth: [birth year]  Today's Date: 02/05/2021 PT Individual Time: 1535-1630   10mn   Short Term Goals: Week 1:  PT Short Term Goal 1 (Week 1): Pt will perform bed mobility at minA PT Short Term Goal 2 (Week 1): Pt will perform bed to chair transfers with modA PT Short Term Goal 3 (Week 1): Pt will perform sit to stand transfer with minA. PT Short Term Goal 4 (Week 1): Pt will ambulate x25' with modA +2.  Skilled Therapeutic Interventions/Progress Updates:   Pt received supine in bed and agreeable to PT, but reports currently on bed pan. Min assist for rolling to perform hygiene and place clean breif. Supine>sit with min assist at trunk. [facility]. UBE 4 min forward/473m reverse with random resistance setting. Min assist for LUE placement due to shoulder weakness. Kinetron BLE strengthening/endurance training 4 x 2 min at 30 cm/sec. Cues for pursed lip breathing throughout. [facility] in stedy from elevated bed x 5 with 2 prolonged therapeutic rest breaks.sit>supine transfer with min assist at LE.  Pt left in bed with all needs met.       Therapy Documentation Precautions:  Precautions Precautions: Fall Restrictions Weight Bearing Restrictions: No    Vital Signs: Therapy Vitals Temp: 98.5 F (36.9 C) Temp Source: Oral Pulse Rate: (!) 106 Resp: 16 BP: (!) 143/91 Oxygen Therapy SpO2: 99 % Pain:   denies   Therapy/Group: Individual Therapy  AuLorie Phenix/26/2022, 7:53 AM

## 2021-02-06 NOTE — Progress Notes (Signed)
PROGRESS NOTE   Subjective/Complaints:  Pt reports no pain today- heating pad worked.  LBM today/this AM- sleeping well.   No issues otherwise.    ROS:  Pt denies SOB, abd pain, CP, N/V/C/D, and vision changes  Objective:   No results found. Recent Labs    02/04/21 0503  WBC 8.8  HGB 9.3*  HCT 31.3*  PLT 317   Recent Labs    02/04/21 0503  NA 139  K 3.7  CL 106  CO2 22  GLUCOSE 117*  BUN 17  CREATININE 1.59*  CALCIUM 8.8*    Intake/Output Summary (Last 24 hours) at 02/06/2021 1636 Last data filed at 02/06/2021 1341 Gross per 24 hour  Intake 804 ml  Output 1200 ml  Net -396 ml        Physical Exam: Vital Signs Blood pressure 129/88, pulse (!) 102, temperature 98.7 F (37.1 C), resp. rate 18, height 5\' 10"  (1.778 m), weight (!) 168 kg, SpO2 100 %.   General: awake, alert, appropriate, NAD; BMI 53 HENT: conjugate gaze; oropharynx moist CV: tachycardic rate; no JVD Pulmonary: CTA B/L; no W/R/R- good air movement GI: soft, NT, ND, (+)BS; protuberant Psychiatric: appropriate- cordial, but slowed processing Neurological: Ox3  Skin: intact Musculoskeletal:     Cervical back: Normal range of motion and neck supple.     Comments: No edema or tenderness in extremities  Skin:    General: Skin is warm and dry.  Neurological:     Mental Status: He is alert.     Comments: Alert and oriented x3 and follows commands. Motor: 4/5 throughout  Psychiatric:         Assessment/Plan: 1. Functional deficits which require 3+ hours per day of interdisciplinary therapy in a comprehensive inpatient rehab setting.  Physiatrist is providing close team supervision and 24 hour management of active medical problems listed below.  Physiatrist and rehab team continue to assess barriers to discharge/monitor patient progress toward functional and medical goals  Care Tool:  Bathing    Body parts bathed by patient:  Chest,Abdomen,Front perineal area,Face,Left arm   Body parts bathed by helper: Right arm,Buttocks,Right upper leg,Left upper leg,Right lower leg,Left lower leg     Bathing assist Assist Level: Total Assistance - Patient < 25%     Upper Body Dressing/Undressing Upper body dressing   What is the patient wearing?: Hospital gown only    Upper body assist Assist Level: Maximal Assistance - Patient 25 - 49%    Lower Body Dressing/Undressing Lower body dressing      What is the patient wearing?: Incontinence brief     Lower body assist Assist for lower body dressing: Dependent - Patient 0%     Toileting Toileting    Toileting assist Assist for toileting: Dependent - Patient 0%     Transfers Chair/bed transfer  Transfers assist     Chair/bed transfer assist level: Total Assistance - Patient < 25% (Stedy)     Locomotion Ambulation   Ambulation assist      Assist level: 2 helpers Assistive device: Parallel bars Max distance: 5'   Walk 10 feet activity   Assist  Walk 10 feet activity did not  occur: Safety/medical concerns        Walk 50 feet activity   Assist Walk 50 feet with 2 turns activity did not occur: Safety/medical concerns         Walk 150 feet activity   Assist Walk 150 feet activity did not occur: Safety/medical concerns         Walk 10 feet on uneven surface  activity   Assist Walk 10 feet on uneven surfaces activity did not occur: Safety/medical concerns         Wheelchair     Assist Will patient use wheelchair at discharge?: No             Wheelchair 50 feet with 2 turns activity    Assist            Wheelchair 150 feet activity     Assist          Blood pressure 129/88, pulse (!) 102, temperature 98.7 F (37.1 C), resp. rate 18, height 5\' 10"  (1.778 m), weight (!) 168 kg, SpO2 100 %.    Medical Problem List and Plan: 1.  Debility secondary to COVID-19 with course complicated by MI/STEMI  as well as AKI             -patient may shower             -ELOS/Goals: 14-17 days/supervision/min a            Continue CIR- therapy notes reviewed- performed 78ft ambulation with mod-maxA  2.  Antithrombotics: -DVT/anticoagulation: Continue Lovenox 82.5 mg daily             -antiplatelet therapy: Aspirin 81 mg daily and Plavix 85 mg daily 3. Pain Management: Continue Oxycodone as needed. Add kpad for right sided upper back muscular pain.  2/26- no pain thhis AM- con't regimen- focus on Kpad              By with increased exertion 4. Mood: Provide emotional support             -antipsychotic agents: N/A 5. Neuropsych: This patient is capable of making decisions on his own behalf. 6. Skin/Wound Care: Routine skin checks 7. Fluids/Electrolytes/Nutrition: Routine in and outs 8.  AKI.  Renal function improved.  CRRT discontinued.  2/26 Cr 1.59-   has been trending downwards with every BMP- down to 1.59- follow regularly Follow-up renal services             Cr improving 2/24, continue to monitor weekly. 9.  COVID-19.  Patient completed course of remdesivir as well as steroids.  Contact precautions discontinued 10.  Super obesity.  BMI 53.52 dietary follow-up 11.  Newly diagnosed uncontrolled diabetes mellitus.  Hemoglobin A1c 13.6.  Continue NovoLog 8 units 3 times daily, Levemir 37 units daily  2/26- BGs 98-134- doing much better on current regimen- con't to monitor with CBGs             Monitor with increased mobility 12.  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 tach.  Cardiology follow-up.  Cardiac catheterization 01/20/2021 no significant CAD.               Continue Metoprolol 50 mg twice daily.  2/26- HR low 100s, but doing well otherwise; con't regimen 13.  Hyperlipidemia: Lipitor  LOS: 3 days A FACE TO FACE EVALUATION WAS PERFORMED  Erik [name] 02/06/2021, 4:36 PM

## 2021-02-06 NOTE — Plan of Care (Signed)
  Problem: RH BOWEL ELIMINATION Goal: RH STG MANAGE BOWEL WITH ASSISTANCE Description: STG Manage Bowel with Mod Assistance. Outcome: Progressing   Problem: RH BLADDER ELIMINATION Goal: RH STG MANAGE BLADDER WITH ASSISTANCE Description: STG Manage Bladder With Mod Assistance Outcome: Progressing   Problem: RH SKIN INTEGRITY Goal: RH STG SKIN FREE OF INFECTION/BREAKDOWN Description: Skin will remain free of infection or breakdown with mod assist. Outcome: Progressing   Problem: RH PAIN MANAGEMENT Goal: RH STG PAIN MANAGED AT OR BELOW PT'S PAIN GOAL Description: Patient will remain with pain less than 2 during stay.  Outcome: Progressing   Problem: Consults Goal: RH GENERAL PATIENT EDUCATION Description: See Patient Education module for education specifics. Outcome: Progressing   

## 2021-02-06 NOTE — Progress Notes (Signed)
Occupational Therapy Session Note  Patient Details  Name: Erik [name] MRN: [id number] Date of Birth: May 11, 1991  Today's Date: 02/06/2021 OT Individual Time: 0900-1000 OT Individual Time Calculation (min): 60 min   Today's Date: 02/06/2021 OT Individual Time: 9702-6378 OT Individual Time Calculation (min): 44 min    Short Term Goals: Week 1:  OT Short Term Goal 1 (Week 1): Patient will complete sit<>stand with max A of 1 person OT Short Term Goal 2 (Week 1): Patient will maintain standing in [place] for 1 minute in prep for BADL task OT Short Term Goal 3 (Week 1): Pt will complete 1 step up LB dressing task  Skilled Therapeutic Interventions/Progress Updates:    1:1. Pt received in bed with no pain reported just soreness in BLE. Pt agreeable to bathing during second afternoon session. Pt completes all transfers in steady with MOD A+1! VC for rocking to shift weight anteriorly. Pt completes 3x2.5 min kinetron for BLE strengthening and respirocal training in prep for functional transfers and 1x3 min with increased resistance. Pt completes toileting with stedy and total A for CM/hygeiene. Exited session with pt seated in bed, exit alarm on and call light in reach   Session 2: 44 min (missed 16 min d/t fatigue)  Pt reporting 4/10 back pain but excited to shower. Pt completes supine>sitting EOB with S and bed rail. MOD A sit to stand throughout session in stedy with +1 A except for last stand needing MOD A of 2 d/t fatigue after shower. Pt able to doff socks with reacher after demo. Pt bathes with A to wash back, buttocks and BLE. Pt would benefit from LHSS to wash BLE and back. Pt returned to EOB and uses wide sock aide to don socks with min VC. Pt familiar as grandfather used sock aide. Pt reporting fatigue and requesting to lay down. Exited session with pt seated in bed with heating pad on back for pain relief, exit alarm on and call light in reach    Therapy Documentation Precautions:   Precautions Precautions: Fall Restrictions Weight Bearing Restrictions: No General:   Vital Signs: Therapy Vitals Temp: 98.5 F (36.9 C) Temp Source: Oral Pulse Rate: (!) 106 Resp: 16 BP: (!) 143/91 Oxygen Therapy SpO2: 99 % Pain:   ADL: ADL Eating: Set up Grooming: Setup Upper Body Bathing: Moderate assistance Lower Body Bathing: Dependent Upper Body Dressing: Maximal assistance Lower Body Dressing: Dependent Toileting: Dependent Vision   Perception    Praxis   Exercises:   Other Treatments:     Therapy/Group: Individual Therapy  Shon Hale 02/06/2021, 6:42 AM

## 2021-02-07 LAB — GLUCOSE, CAPILLARY
Glucose-Capillary: 145 mg/dL — ABNORMAL HIGH (ref 70–99)
Glucose-Capillary: 145 mg/dL — ABNORMAL HIGH (ref 70–99)
Glucose-Capillary: 116 mg/dL — ABNORMAL HIGH (ref 70–99)
Glucose-Capillary: 150 mg/dL — ABNORMAL HIGH (ref 70–99)

## 2021-02-07 NOTE — Plan of Care (Signed)
  Problem: RH BOWEL ELIMINATION Goal: RH STG MANAGE BOWEL WITH ASSISTANCE Description: STG Manage Bowel with Mod Assistance. Outcome: Progressing   Problem: RH BLADDER ELIMINATION Goal: RH STG MANAGE BLADDER WITH ASSISTANCE Description: STG Manage Bladder With Mod Assistance Outcome: Progressing   Problem: RH SKIN INTEGRITY Goal: RH STG SKIN FREE OF INFECTION/BREAKDOWN Description: Skin will remain free of infection or breakdown with mod assist. Outcome: Progressing   Problem: RH PAIN MANAGEMENT Goal: RH STG PAIN MANAGED AT OR BELOW PT'S PAIN GOAL Description: Patient will remain with pain less than 2 during stay.  Outcome: Progressing   Problem: Consults Goal: RH GENERAL PATIENT EDUCATION Description: See Patient Education module for education specifics. Outcome: Progressing   

## 2021-02-08 DIAGNOSIS — N179 Acute kidney failure, unspecified: Secondary | ICD-10-CM

## 2021-02-08 DIAGNOSIS — E1169 Type 2 diabetes mellitus with other specified complication: Secondary | ICD-10-CM

## 2021-02-08 DIAGNOSIS — E669 Obesity, unspecified: Secondary | ICD-10-CM

## 2021-02-08 LAB — GLUCOSE, CAPILLARY
Glucose-Capillary: 151 mg/dL — ABNORMAL HIGH (ref 70–99)
Glucose-Capillary: 141 mg/dL — ABNORMAL HIGH (ref 70–99)
Glucose-Capillary: 132 mg/dL — ABNORMAL HIGH (ref 70–99)
Glucose-Capillary: 128 mg/dL — ABNORMAL HIGH (ref 70–99)

## 2021-02-08 NOTE — Progress Notes (Signed)
PROGRESS NOTE   Subjective/Complaints:  Had a reasonable night. Says his back hurts a little bit. No issues breathing. Nurse reported to me that he's not moving much, even in bed  ROS: Patient denies fever, rash, sore throat, blurred vision, nausea, vomiting, diarrhea, cough, shortness of breath or chest pain,  headache, or mood change.    Objective:   No results found. No results for input(s): WBC, HGB, HCT, PLT in the last 72 hours. No results for input(s): NA, K, CL, CO2, GLUCOSE, BUN, CREATININE, CALCIUM in the last 72 hours.  Intake/Output Summary (Last 24 hours) at 02/08/2021 1002 Last data filed at 02/08/2021 0852 Gross per 24 hour  Intake 1120 ml  Output 2175 ml  Net -1055 ml        Physical Exam: Vital Signs Blood pressure (!) 156/99, pulse (!) 103, temperature 98.5 F (36.9 C), resp. rate 18, height 5\' 10"  (1.778 m), weight (!) 169.3 kg, SpO2 99 %.   Constitutional: No distress . Vital signs reviewed. HEENT: EOMI, oral membranes moist Neck: supple Cardiovascular: RRR without murmur. No JVD    Respiratory/Chest: CTA Bilaterally without wheezes or rales. Normal effort    GI/Abdomen: BS +, non-tender, non-distended Ext: no clubbing, cyanosis, or edema Psych: pleasant and cooperative Skin: intact Musculoskeletal:     Cervical back: Normal range of motion and neck supple.     Comments: No edema or tenderness in extremities  Skin:    General: Skin is warm and dry. MASD sacrum/buttocks Neurological:     Alert and oriented x 3. Normal insight and awareness. Intact Memory. Normal language and speech. Cranial nerve exam unremarkable  Motor: 4+/5 RUE, LUE 3+ deltoid, 4-/5 tricep, bicep and 5/5 hand. BLE 4/5 prox to 4+/5 distally. No sensory findings.  Psychiatric:         Assessment/Plan: 1. Functional deficits which require 3+ hours per day of interdisciplinary therapy in a comprehensive inpatient rehab  setting.  Physiatrist is providing close team supervision and 24 hour management of active medical problems listed below.  Physiatrist and rehab team continue to assess barriers to discharge/monitor patient progress toward functional and medical goals  Care Tool:  Bathing    Body parts bathed by patient: Chest,Abdomen,Front perineal area,Face,Left arm   Body parts bathed by helper: Right arm,Buttocks,Right upper leg,Left upper leg,Right lower leg,Left lower leg     Bathing assist Assist Level: Total Assistance - Patient < 25%     Upper Body Dressing/Undressing Upper body dressing   What is the patient wearing?: Hospital gown only    Upper body assist Assist Level: Maximal Assistance - Patient 25 - 49%    Lower Body Dressing/Undressing Lower body dressing      What is the patient wearing?: Incontinence brief     Lower body assist Assist for lower body dressing: Dependent - Patient 0%     Toileting Toileting    Toileting assist Assist for toileting: Dependent - Patient 0%     Transfers Chair/bed transfer  Transfers assist     Chair/bed transfer assist level: Total Assistance - Patient < 25% )     Locomotion Ambulation   Ambulation assist  Assist level: 2 helpers Assistive device: Parallel bars Max distance: 5'   Walk 10 feet activity   Assist  Walk 10 feet activity did not occur: Safety/medical concerns        Walk 50 feet activity   Assist Walk 50 feet with 2 turns activity did not occur: Safety/medical concerns         Walk 150 feet activity   Assist Walk 150 feet activity did not occur: Safety/medical concerns         Walk 10 feet on uneven surface  activity   Assist Walk 10 feet on uneven surfaces activity did not occur: Safety/medical concerns         Wheelchair     Assist Will patient use wheelchair at discharge?: No             Wheelchair 50 feet with 2 turns activity    Assist             Wheelchair 150 feet activity     Assist          Blood pressure (!) 156/99, pulse (!) 103, temperature 98.5 F (36.9 C), resp. rate 18, height 5\' 10"  (1.778 m), weight (!) 169.3 kg, SpO2 99 %.    Medical Problem List and Plan: 1.  Debility secondary to COVID-19 with course complicated by MI/STEMI as well as AKI             -patient may shower             -ELOS/Goals: 14-17 days/supervision/min a            --Continue CIR therapies including PT, OT 2.  Antithrombotics: -DVT/anticoagulation: Continue Lovenox 82.5 mg daily             -antiplatelet therapy: Aspirin 81 mg daily and Plavix 85 mg daily 3. Pain Management: Continue Oxycodone as needed. Add kpad for right sided upper back muscular pain.  2/28 con't regimen- focus on Kpad                4. Mood: Provide emotional support             -antipsychotic agents: N/A 5. Neuropsych: This patient is capable of making decisions on his own behalf. 6. Skin/Wound Care: Routine skin checks  -local care to backside  -discussed specialty mattress with nursing--order 7. Fluids/Electrolytes/Nutrition: Routine in and outs 8.  AKI.  Renal function improved.  CRRT discontinued.  2/26 Cr 1.59-   has been trending downwards with every BMP- down to 1.59- follow regularly  -recheck labs this week 9.  COVID-19.  Patient completed course of remdesivir as well as steroids.  Contact precautions discontinued 10.  Super obesity.  BMI 53.52 dietary follow-up 11.  Newly diagnosed uncontrolled diabetes mellitus.  Hemoglobin A1c 13.6.  Continue NovoLog 8 units 3 times daily, Levemir 37 units daily  2/28 -fair control at present (max 151)---no changes for now 12.  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 tach.  Cardiology follow-up.  Cardiac catheterization 01/20/2021 no significant CAD.               Continue Metoprolol 50 mg twice daily.  2/28- HR remains in low 100s,  con't regimen 13.  Hyperlipidemia:  Lipitor  LOS: 5 days A FACE TO FACE EVALUATION WAS PERFORMED  3/28 02/08/2021, 10:02 AM

## 2021-02-08 NOTE — Progress Notes (Signed)
Occupational Therapy Session Note  Patient Details  Name: Erik [name] MRN: [id number] Date of Birth: 19-May-1991  Today's Date: 02/08/2021 OT Individual Time: 1240-1340 OT Individual Time Calculation (min): 60 min    Short Term Goals: Week 1:  OT Short Term Goal 1 (Week 1): Patient will complete sit<>stand with max A of 1 person OT Short Term Goal 2 (Week 1): Patient will maintain standing in [place] for 1 minute in prep for BADL task OT Short Term Goal 3 (Week 1): Pt will complete 1 step up LB dressing task  Skilled Therapeutic Interventions/Progress Updates:    Pt greeted semi-reclined in bed and agreeable to shower today. Pt completed bed mobility with min A to elevate trunk into sitting. Stedy used for transfer onto bariatric BSC over toilet. Worked on standing balance for pt to manage his brief prior to sitting onto commode. Pt with continent BM. Worked on toileting strategies in standing with pt able to reach behind and perform toileting with min A. Shower transfer with mod A to come into initial stand. Bathing completed while seated on bariatric BSC to allow for better access to wash bottom and peri-area. OT educated on use of long handled sponge, but unable to issue one 2/2 out of stock. Sit<>stand with Charlaine Dalton out of shower with min A to get into standing. Pt sat in [employer] grooming tasks at the sink. Pt does not have clothes yet, so he donned a clean gown and a brief. Pt brought to EOB in Stedy and donned socks using sock-aid and set-up A. Pt then performed 1 sit<>stand from EOB with bariatric RW and Mod A! Pt returned to supine with min A. Pt left semi-reclined in bed with bed alarm on ,call bell in reach, and needs met.   Therapy Documentation Precautions:  Precautions Precautions: Fall Restrictions Weight Bearing Restrictions: No Pain: Pain Assessment Pain Scale: 0-10 Pain Score: 0-No pain   Therapy/Group: Individual Therapy  Valma Cava 02/08/2021, 12:46 PM

## 2021-02-08 NOTE — Progress Notes (Signed)
Physical Therapy Session Note  Patient Details  Name: Erik [name] MRN: [id number] Date of Birth: 1991/03/28  Today's Date: 02/08/2021 PT Individual Time: 0902-1015 and 1450-1531 PT Individual Time Calculation (min): 73 min and 41 min  Short Term Goals: Week 1:  PT Short Term Goal 1 (Week 1): Pt will perform bed mobility at minA PT Short Term Goal 2 (Week 1): Pt will perform bed to chair transfers with modA PT Short Term Goal 3 (Week 1): Pt will perform sit to stand transfer with minA. PT Short Term Goal 4 (Week 1): Pt will ambulate x25' with modA +2.  Skilled Therapeutic Interventions/Progress Updates:     1st Session: Pt received supine in bed and agrees to therapy. Reports some pain in back and has heating pad to address pain. PT provides rest breaks to help manage pain during session. Pt performs supine to sit with verbal cues on sequencing and positioning at EOB. From elevated bed, pt performs sit to stand to [place] with minA +2 and transfer to [facility] with totalA. WC transport to gym for time management. Pt performs ball tosses at trampline 1x20 forward chest pass and 1x20 from head level. Session focused on transitioning pt to RW as he had only stood and ambulated in parallel bars prior. PT provides pt with bariatric RW and pt performs multiple reps of sit to stand during session with minA/modA +2. PT cues for body mechanics and activating glutes to improve efficiency and independence with transfer. In standing, pt progresses form static stand to lateral weight shifting to marching in place, prior to attempting ambulation. Pt then ambulates bouts of 10' and 19' with minA +1 and close WC follow +2. Pt does not buckle in bilateral lower extremities this session. Following extended seated rest breaks, pt stands and removes hands from RW to increase strengthening of legs and to challenge balance. Pt weight shifts laterally without upper extremity support but is unable to shift weight in  anterior/posterior direction. Pt performs stand step transfer back to bed with modA +1 and RW. Sit to supine with cues on sequencing. Left with all needs within reach.  2nd Session: Pt received supine in bed and agrees to therapy. No complaint of pain. Supine to sit with cues on positioning at EOB. Pt performs sit to stand transfer with RW and minA. Stand step transfer to [facility] with mInA. WC transport to gym for time management. Pt performs Wii bowling game to encourage increased time on feet for strengthening, balance training, and activity tolerance. Pt performs multiple reps of sit to stand during activity with minA, and remains standing up to 5 minutes at a time. Pt progresses to performing activity with no upper extremity support. PT cues for body mechanics and increased eccentric control when transitioning from standing to sitting. Following, pt practices transfer with supervision and PT steadying RW, with cues for hand placement and body mechanics. Pt is able to complete sit to stand without physical assistance multiple times. Pt then ambulates x10' with CGA and very close WC follow. WC transport back to gym. Stand step transfer to bed with RW and CGA. Left supine in bed with alarm intact and all needs within reach.  Therapy Documentation Precautions:  Precautions Precautions: Fall Restrictions Weight Bearing Restrictions: No    Therapy/Group: Individual Therapy  Beau Fanny, PT, DPT 02/08/2021, 3:42 PM

## 2021-02-09 DIAGNOSIS — I10 Essential (primary) hypertension: Secondary | ICD-10-CM

## 2021-02-09 LAB — GLUCOSE, CAPILLARY
Glucose-Capillary: 115 mg/dL — ABNORMAL HIGH (ref 70–99)
Glucose-Capillary: 137 mg/dL — ABNORMAL HIGH (ref 70–99)
Glucose-Capillary: 107 mg/dL — ABNORMAL HIGH (ref 70–99)
Glucose-Capillary: 149 mg/dL — ABNORMAL HIGH (ref 70–99)

## 2021-02-09 MED ORDER — METOPROLOL TARTRATE 50 MG PO TABS
75.0000 mg | ORAL_TABLET | Freq: Two times a day (BID) | ORAL | Status: DC
  Administered 2021-02-09 – 2021-02-11 (×4): 75 mg via ORAL
  Filled 2021-02-09 (×4): qty 1

## 2021-02-09 NOTE — Progress Notes (Signed)
PROGRESS NOTE   Subjective/Complaints:  Did pretty well last night. Likes new mattress. Denies pain this morning.   ROS: Patient denies fever, rash, sore throat, blurred vision, nausea, vomiting, diarrhea, cough, shortness of breath or chest pain, joint or back pain, headache, or mood change.    Objective:   No results found. No results for input(s): WBC, HGB, HCT, PLT in the last 72 hours. No results for input(s): NA, K, CL, CO2, GLUCOSE, BUN, CREATININE, CALCIUM in the last 72 hours.  Intake/Output Summary (Last 24 hours) at 02/09/2021 0923 Last data filed at 02/09/2021 0716 Gross per 24 hour  Intake 1320 ml  Output 2700 ml  Net -1380 ml        Physical Exam: Vital Signs Blood pressure (!) 143/96, pulse 99, temperature 98.3 F (36.8 C), temperature source Oral, resp. rate 16, height 5\' 10"  (1.778 m), weight (!) 171 kg, SpO2 98 %.   Constitutional: No distress . Vital signs reviewed. obese HEENT: EOMI, oral membranes moist Neck: supple Cardiovascular: RRR without murmur. No JVD    Respiratory/Chest: CTA Bilaterally without wheezes or rales. Normal effort    GI/Abdomen: BS +, non-tender, non-distended Ext: no clubbing, cyanosis, or edema Psych: pleasant and cooperative Skin: intact Musculoskeletal:     Cervical back: Normal range of motion and neck supple.     Comments: No edema or tenderness in extremities  Skin:    General: Skin is warm and dry. MASD sacrum/buttocks--did not examine Neurological:     Alert and oriented x 3. Normal insight and awareness. Intact Memory. Normal language and speech. Cranial nerve exam unremarkable  Motor: 4+/5 RUE, LUE 3+ deltoid, 4-/5 tricep, bicep and 5/5 hand. BLE 4/5 prox to 4+/5 distally. No sensory findings.            Assessment/Plan: 1. Functional deficits which require 3+ hours per day of interdisciplinary therapy in a comprehensive inpatient rehab setting.  Physiatrist  is providing close team supervision and 24 hour management of active medical problems listed below.  Physiatrist and rehab team continue to assess barriers to discharge/monitor patient progress toward functional and medical goals  Care Tool:  Bathing    Body parts bathed by patient: Chest,Abdomen,Front perineal area,Face,Left arm   Body parts bathed by helper: Right arm,Buttocks,Right upper leg,Left upper leg,Right lower leg,Left lower leg     Bathing assist Assist Level: Total Assistance - Patient < 25%     Upper Body Dressing/Undressing Upper body dressing   What is the patient wearing?: Hospital gown only    Upper body assist Assist Level: Maximal Assistance - Patient 25 - 49%    Lower Body Dressing/Undressing Lower body dressing      What is the patient wearing?: Incontinence brief     Lower body assist Assist for lower body dressing: Dependent - Patient 0%     Toileting Toileting    Toileting assist Assist for toileting: Dependent - Patient 0%     Transfers Chair/bed transfer  Transfers assist     Chair/bed transfer assist level: Moderate Assistance - Patient 50 - 74%     Locomotion Ambulation   Ambulation assist      Assist level: 2  helpers Assistive device: Walker-rolling Max distance: 19'   Walk 10 feet activity   Assist  Walk 10 feet activity did not occur: Safety/medical concerns  Assist level: 2 helpers Assistive device: Walker-rolling   Walk 50 feet activity   Assist Walk 50 feet with 2 turns activity did not occur: Safety/medical concerns         Walk 150 feet activity   Assist Walk 150 feet activity did not occur: Safety/medical concerns         Walk 10 feet on uneven surface  activity   Assist Walk 10 feet on uneven surfaces activity did not occur: Safety/medical concerns         Wheelchair     Assist Will patient use wheelchair at discharge?: No             Wheelchair 50 feet with 2 turns  activity    Assist            Wheelchair 150 feet activity     Assist          Blood pressure (!) 143/96, pulse 99, temperature 98.3 F (36.8 C), temperature source Oral, resp. rate 16, height 5\' 10"  (1.778 m), weight (!) 171 kg, SpO2 98 %.    Medical Problem List and Plan: 1.  Debility secondary to COVID-19 with course complicated by MI/STEMI as well as AKI             -patient may shower             -ELOS/Goals: 14-17 days/supervision/min a            --Continue CIR therapies including PT, OT. Team conference today 2.  Antithrombotics: -DVT/anticoagulation: Continue Lovenox 82.5 mg daily             -antiplatelet therapy: Aspirin 81 mg daily and Plavix 85 mg daily 3. Pain Management: Continue Oxycodone as needed. Add kpad for right sided upper back muscular pain.  3/1 con't regimen- focus on Kpad                4. Mood: Provide emotional support             -antipsychotic agents: N/A 5. Neuropsych: This patient is capable of making decisions on his own behalf. 6. Skin/Wound Care: Routine skin checks  -local care to backside  -  specialty mattress in place 7. Fluids/Electrolytes/Nutrition: Routine in and outs 8.  AKI.  Renal function improved.  CRRT discontinued.  2/26 Cr 1.59-   has been trending downwards with every BMP- down to 1.59- follow regularly  -recheck labs this week 9.  COVID-19.  Patient completed course of remdesivir as well as steroids.  Contact precautions discontinued 10.  Super obesity.  BMI 53.52 dietary follow-up 11.  Newly diagnosed uncontrolled diabetes mellitus.  Hemoglobin A1c 13.6.  Continue NovoLog 8 units 3 times daily, Levemir 37 units daily  3/1 reasonable control at present--continue current regimen 12.  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 tach.  Cardiology follow-up.  Cardiac catheterization 01/20/2021 no significant CAD.               Continue Metoprolol 50 mg twice daily.  3/1 HR around 100, BP still  elevated   -increase metoprolol to 75mg  bid 13.  Hyperlipidemia: Lipitor  LOS: 6 days A FACE TO FACE EVALUATION WAS PERFORMED  03/20/2021 02/09/2021, 9:23 AM

## 2021-02-09 NOTE — Progress Notes (Signed)
Physical Therapy Session Note  Patient Details  Name: Erik [name] MRN: [id number] Date of Birth: 1990/12/28  Today's Date: 02/09/2021 PT Individual Time: 1006-1030 and 1305-1400 PT Individual Time Calculation (min): 24 min and 55 min  Short Term Goals: Week 1:  PT Short Term Goal 1 (Week 1): Pt will perform bed mobility at minA PT Short Term Goal 2 (Week 1): Pt will perform bed to chair transfers with modA PT Short Term Goal 3 (Week 1): Pt will perform sit to stand transfer with minA. PT Short Term Goal 4 (Week 1): Pt will ambulate x25' with modA +2.  Skilled Therapeutic Interventions/Progress Updates:     1st Session: Pt received seated in [facility] and agrees to therapy. No complaint of pain. WC transport to gym for time management. Pt performs multiple reps of sit to stand during session, requiring CGA for safety and cues on initiation, mechanics, and positioning. Pt performs standing marching with RW prior to ambulation and does not demo any knee buckling.  Pt ambulates 79' with minA +1 and very close WC follow +2. Following extended seated rest break pt ambulates 74' with same assist. Pt then performs stand step transfer back to bed with CGA and sit to supine with supervision. Left with all needs within reach.  2nd Session: Pt received supine in bed and agrees to therapy. No report of pain. Supine to sit with verbal cues on positioning. Sit to stand and stand step transfer to [facility] with CGA and cues for body mechanics and sequencing. Pt performs standing marching in place with RW x10 steps with each leg prior to ambulation. Pt ambulates 143' with RW and minA +1 and WC follow +2. Pt does suddenly sit form fatigue at end of gait distance. Following extended seated rest break, pt performs NMR for standing balance, strength, and activity tolerance. Pt stands with RW and then releases upper extremity support. PT tech tosses ball to pt and pt catches ball and tosses back to tech. PT provides CGA for safety  and multimodal cues for posture and positioning. Pt requires several seated rest breaks due to fatigue but no LOBs. During rest break pt vitals taken with O2 sats at 100% on room air and HR 128 BPM. Activity progresses to tossing ball toward edges of pt's BOS. WC transport back to room. Stand step transfer back to bed with RW and CGA. Sit to supine with cues on positioning. Left with all needs within reach.  Therapy Documentation Precautions:  Precautions Precautions: Fall Restrictions Weight Bearing Restrictions: No    Therapy/Group: Individual Therapy  Beau Fanny, PT, DPT 02/09/2021, 10:20 AM

## 2021-02-09 NOTE — Progress Notes (Signed)
Occupational Therapy Session Note  Patient Details  Name: Erik [name] MRN: [id number] Date of Birth: 1991/11/11  Today's Date: 02/09/2021 OT Individual Time: 3154-0086 OT Individual Time Calculation (min): 60 min    Short Term Goals: Week 1:  OT Short Term Goal 1 (Week 1): Patient will complete sit<>stand with max A of 1 person OT Short Term Goal 2 (Week 1): Patient will maintain standing in [place] for 1 minute in prep for BADL task OT Short Term Goal 3 (Week 1): Pt will complete 1 step up LB dressing task  Skilled Therapeutic Interventions/Progress Updates:    Pt greeted semi-reclined in bed and agreeable to OT treatment session. Pt declined bathing/dressing as pt showered yesterday. Pt attempted sit<>stand at EOB with bariatric RW. Pt with L knee buckle 2x and stated he felt his feet were sliding. Pt unable to achieve sit<>stand safely. OT placed Stedy and pt was able to stand with max A+2. Pt transferred to wc in [place]. Grooming tasks completed with set-up at the sink. Worked on sit<>stands at high-low table pushing up with both hands from wc. Pt was then able to achieve sit<>stand with min A. Worked on standing endurance and L shoulder strength and ROM with graded clothes pin task, then standing supported shoulder glides on table. Pt tolerated 4 standing bouts and 2 minutes at each standing task. Pt returned to room and left seated in wc with call bell in reach and needs met.  Therapy Documentation Precautions:  Precautions Precautions: Fall Restrictions Weight Bearing Restrictions: No Pain: Pain Assessment Pain Scale: 0-10 Pain Score: 0-No pain   Therapy/Group: Individual Therapy  Valma Cava 02/09/2021, 9:14 AM

## 2021-02-09 NOTE — Progress Notes (Signed)
Physical Therapy Session Note  Patient Details  Name: Erik [name] MRN: [id number] Date of Birth: May 02, 1991  Today's Date: 02/09/2021 PT Individual Time: 1510-1535 PT Individual Time Calculation (min): 25 min   Short Term Goals: Week 1:  PT Short Term Goal 1 (Week 1): Pt will perform bed mobility at minA PT Short Term Goal 2 (Week 1): Pt will perform bed to chair transfers with modA PT Short Term Goal 3 (Week 1): Pt will perform sit to stand transfer with minA. PT Short Term Goal 4 (Week 1): Pt will ambulate x25' with modA +2.  Skilled Therapeutic Interventions/Progress Updates:    Pt received supine in bed, agreeable to PT session. No complaints of pain. Bed mobility Supervision with HOB elevated and use of bedrails. Sit to stand with min A to RW throughout session. Stand pivot transfer to w/c with RW and min A. Demonstrated how to perform w/c mobility with use of BUE. Manual w/c propulsion x 25 ft with use of BUE at Supervision level before onset of fatigue. Sit to stand x 5 reps from w/c to RW with min A. Ambulation x 150 ft total with RW and min A with close w/c follow for safety with several seated and standing rest breaks. Longest distance for ambulation this session is 50 ft. Pt requests to return to bed at end of session, Supervision for sit to supine. Pt left semi-reclined in bed with needs in reach at end of session.  Therapy Documentation Precautions:  Precautions Precautions: Fall Restrictions Weight Bearing Restrictions: No   Therapy/Group: Individual Therapy   Peter Congo, PT, DPT  02/09/2021, 4:56 PM

## 2021-02-09 NOTE — Patient Care Conference (Signed)
Inpatient RehabilitationTeam Conference and Plan of Care Update Date: 02/09/2021   Time: 10:34 AM    Patient Name: Erik [name]      Medical Record Number: [id number]  Date of Birth: 05-19-1991 Sex: Male         Room/Bed: 4W01C/4W01C-01 Payor Info: Payor: /    Admit Date/Time:  02/03/2021 12:52 PM  Primary Diagnosis:  Debility  Hospital Problems: Principal Problem:   Debility    Expected Discharge Date: Expected Discharge Date: 02/26/21  Team Members Present: Physician leading conference: Dr. Faith Rogue Care Coodinator Present: Cecile Sheerer, LCSWA;Leydy Worthey Marlyne Beards, RN, BSN, CRRN Nurse Present: Other (comment) Rae Lips, RN) PT Present: Malachi Pro, PT OT Present: Kearney Hard, OT PPS Coordinator present : Fae Pippin, SLP     Current Status/Progress Goal Weekly Team Focus  Bowel/Bladder   continent od B/B. Last BM_2/27  remain continent  assess q shift and PRN   Swallow/Nutrition/ Hydration             ADL's   Mod A stedy transfers, max A BADLs  Supervision/CGA  general strengthening, activity tolerance, sit<>stands, transfers   Mobility   supervision bed mobility with features, minA sit to stand with RW, gait ~50' with RW minA +! and WC +2  supervision  BLE strength, balance, ambulation   Communication             Safety/Cognition/ Behavioral Observations            Pain   no pain  pain<3  assess pain q shift and PRN   Skin   MASD [place], groin area; skin tear on the back-mid  continue special matress, No new breakdown  assess skin q shift and PRN     Discharge Planning:  Pt is uninsured. D/c to home with 24/7 care. Mother is a dialysis pt- TTS 1st shift. Pt mother reports pt will have assistance from various family members that can stay with pt if needed.   Team Discussion: Watching a couple skin issues. Nursing educating on using the [facility] and not using a bedpan anymore. CBG's are good, no complaint's of pain, skin on bottom is improving.  Discharge home with mom. Family is close by, mom has HD on TTS. Patient on target to meet rehab goals: Working on lower body ADL's with AD equipment. Used a stedy 1st, able to stand with RW, slight buckle. Legs tend to buckle frequently but it is getting less.  *See Care Plan and progress notes for long and short-term goals.   Revisions to Treatment Plan:  None at this time.  Teaching Needs: Family education, medication management, skin/wound care, transfer training, gait training, stair training, endurance training, pain management.  Current Barriers to Discharge: Inaccessible home environment, Decreased caregiver support, Home enviroment access/layout, Wound care, Lack of/limited family support, Medication compliance and Behavior  Possible Resolutions to Barriers: Continue current medications, provide emotional support.     Medical Summary Current Status: debility after COVID, MI. bp/hr still elevated. 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  Barriers to Discharge: Medical stability   Possible Resolutions to [employer] Focus: bp/hr med adjustment, daily data/vs assessment. skin control   Continued Need for Acute Rehabilitation Level of Care: The patient requires daily medical management by a physician with specialized training in physical medicine and rehabilitation for the following reasons: Direction of a multidisciplinary physical rehabilitation program to maximize functional independence : Yes Medical management of patient stability for increased activity during participation in an intensive rehabilitation regime.: Yes Analysis  of laboratory values and/or radiology reports with any subsequent need for medication adjustment and/or medical intervention. : Yes   I attest that I was present, lead the team conference, and concur with the assessment and plan of the team.   Tennis Must 02/09/2021, 3:51 PM

## 2021-02-09 NOTE — Progress Notes (Signed)
Patient ID: Erik [name], male   DOB: 04/29/1991, 29 y.o.   MRN: [id number]  SW met with pt in room, and called pt mother Lawanner ([phone]) to provide updates from team conference, and d/c date 3/18. Family education scheduled for 3/14 9am-11am.  Per [facility] acct, pt not program eligible. SW to provide Medicaid application.  Auria Chamberlain, MSW, LCSWA Office: [phone] Cell: [phone] Fax: [phone] 

## 2021-02-10 LAB — GLUCOSE, CAPILLARY
Glucose-Capillary: 142 mg/dL — ABNORMAL HIGH (ref 70–99)
Glucose-Capillary: 129 mg/dL — ABNORMAL HIGH (ref 70–99)
Glucose-Capillary: 116 mg/dL — ABNORMAL HIGH (ref 70–99)
Glucose-Capillary: 108 mg/dL — ABNORMAL HIGH (ref 70–99)

## 2021-02-10 MED ORDER — ENOXAPARIN SODIUM 80 MG/0.8ML ~~LOC~~ SOLN
80.0000 mg | SUBCUTANEOUS | Status: DC
  Administered 2021-02-10 – 2021-02-17 (×8): 80 mg via SUBCUTANEOUS
  Filled 2021-02-10 (×8): qty 0.8

## 2021-02-10 NOTE — Progress Notes (Signed)
Occupational Therapy Session Note  Patient Details  Name: Erik [name] MRN: [id number] Date of Birth: February 11, 1991  Today's Date: 02/10/2021 OT Individual Time: 9373-4287 OT Individual Time Calculation (min): 40 min    Short Term Goals: Week 1:  OT Short Term Goal 1 (Week 1): Patient will complete sit<>stand with max A of 1 person OT Short Term Goal 2 (Week 1): Patient will maintain standing in [place] for 1 minute in prep for BADL task OT Short Term Goal 3 (Week 1): Pt will complete 1 step up LB dressing task  Skilled Therapeutic Interventions/Progress Updates:    1;1. Pt received in w/c at sink with direct handoff from OT. Pt completes UB ADLs at sink with S and grooming with MOD I seated for energy conservation. Pt able to manage pills and manipulate 1 at a time using LUE at non dominent level. Pt continues to demo poor L shoulder ROM. In tx gym, pt completes uphill towel slides targeting shoulder flexion and horizontal ab/adduction with 2x1 min each movement with inhibitory tactile cues at upper trap. Exited session with pt seated in bed after CGA ambulatroy transfer with RW from doorway to EOB, exit alarm on and call light in reach   Therapy Documentation Precautions:  Precautions Precautions: Fall Restrictions Weight Bearing Restrictions: No General:   Vital Signs: Therapy Vitals Temp: 98 F (36.7 C) Pulse Rate: (!) 101 Resp: 18 BP: (!) 166/99 Patient Position (if appropriate): Lying Oxygen Therapy SpO2: 99 % O2 Device: Room Air Pain:   ADL: ADL Eating: Set up Grooming: Setup Upper Body Bathing: Moderate assistance Lower Body Bathing: Dependent Upper Body Dressing: Maximal assistance Lower Body Dressing: Dependent Toileting: Dependent Vision   Perception    Praxis   Exercises:   Other Treatments:     Therapy/Group: Individual Therapy  Shon Hale 02/10/2021, 6:43 AM

## 2021-02-10 NOTE — Progress Notes (Signed)
Occupational Therapy Session Note  Patient Details  Name: Erik [name] MRN: [id number] Date of Birth: [birth year]  Today's Date: 02/10/2021 OT Individual Time: 0730-0800 OT Individual Time Calculation (min): 30 min    Short Term Goals: Week 1:  OT Short Term Goal 1 (Week 1): Patient will complete sit<>stand with max A of 1 person OT Short Term Goal 2 (Week 1): Patient will maintain standing in [place] for 1 minute in prep for BADL task OT Short Term Goal 3 (Week 1): Pt will complete 1 step up LB dressing task  Skilled Therapeutic Interventions/Progress Updates:    Pt received supine with no c/o pain, agreeable to OT session. Pt completed LB bathing sitting in bed with HOB elevated. He was able to reach as far as his ankle and required assist to wash feet and apply lotion. Pt rolled R and L for peri hygiene- CGA overall. RN entered and was consulted to ensure proper creams applied to pt's sacrum and open areas. Barrier cream and anti-fungal powder applied liberally. Brief donned with max A. Pt was able to don pants with mod A overall, requiring assist to thread over feet. Pt completed bed mobility with close supervision using bed features. Pt stood with RW with min A. He marched in place before completing stand pivot transfer with min A- CGA. Pt was passed off to OT Hana in room.   Therapy Documentation Precautions:  Precautions Precautions: Fall Restrictions Weight Bearing Restrictions: No   Therapy/Group: Individual Therapy  Crissie Reese 02/10/2021, 7:24 AM

## 2021-02-10 NOTE — Progress Notes (Signed)
Physical Therapy Session Note  Patient Details  Name: Erik [name] MRN: [id number] Date of Birth: 1991/02/05  Today's Date: 02/10/2021 PT Individual Time: 6237-6283 and 1432-1530 PT Individual Time Calculation (min): 39 min and 58 min  Short Term Goals: Week 1:  PT Short Term Goal 1 (Week 1): Pt will perform bed mobility at minA PT Short Term Goal 2 (Week 1): Pt will perform bed to chair transfers with modA PT Short Term Goal 3 (Week 1): Pt will perform sit to stand transfer with minA. PT Short Term Goal 4 (Week 1): Pt will ambulate x25' with modA +2.  Skilled Therapeutic Interventions/Progress Updates:     1st Session: Pt received supine in bed and agrees to therapy. Supine to sit with cues on positioning and sequencing. No complaint of pain. Stand step transfer to [facility] with RW and CGA. WC transport to gym for time management. Pt attempts to stand to weight self on scale but is unable to pull up from [facility] on scale. Pt then stands with RW and CGA, performing sidestep up onto scale witch CGA and cues for sequencing. Following seated rest break, pt propels WC x80' with bilateral upper extremities with cues for technique and mechanics to optimize efficiency. Pt performs NMR for standing balance and activity tolerance, standing without upper extremity support, reaching for bean bags with L hand and tossing at target with R hand. PT provides CGA for safety. Pt has initially LOB backward. PT cues for anterior weight shifting and posture to improve balance. No further LOBs. WC transport back to room and stand step transfer back to bed with RW and CGA. Left supine in bed with all needs within reach.  2nd Session: Pt received supine in bed and agrees to therapy. No complaint of pain. Supine to sit with close supervision and cues for positioning at EOB. Sit to stand and stand step transfer to [facility] with CGA. WC transport outside for time management. Pt performs mobility outdoors for training in open environment over  varying and unlevel surfaces. Sit to stand with CGA and cues on body mechanics. Pt performs marching in place x30 with each foot. Following seated rest break pt ambulates x50' with RW and minA for safety, with cues for increasing step height and upright gaze to improve balance. WC transport back inside. Pt transfers to [place] with CGA. Pt completes x5:00 on [place] at workload of 5 with steps per minute >60. Performed for strength and endurance training as well as reciprocal coordination. Pt visibly fatigued following activity. PT assesses vitals with pt's HR at 130 bpm and O2 at 100% on room air. Verbal cues for pursed lip breathing to optimize recovery. Pt self propels WC x80' with both arms for continued endurance training. Stand step transfer back to bed with CGA. Left supine with all needs within reach.  Therapy Documentation Precautions:  Precautions Precautions: Fall Restrictions Weight Bearing Restrictions: No   Therapy/Group: Individual Therapy  Beau Fanny PT, DPT 02/10/2021, 3:48 PM

## 2021-02-10 NOTE — Progress Notes (Signed)
Patient ID: Erik [name], male   DOB: 10/19/1991, 29 y.o.   MRN: [id number]  SW met with pt in room to provide updates from [facility] acct about Medicaid. SW provided pt with Medicaid application. Pt given temp handicap placard.  Auria Chamberlain, MSW, LCSWA Office: [phone] Cell: [phone] Fax: [phone] 

## 2021-02-11 LAB — GLUCOSE, CAPILLARY
Glucose-Capillary: 124 mg/dL — ABNORMAL HIGH (ref 70–99)
Glucose-Capillary: 144 mg/dL — ABNORMAL HIGH (ref 70–99)
Glucose-Capillary: 99 mg/dL (ref 70–99)
Glucose-Capillary: 110 mg/dL — ABNORMAL HIGH (ref 70–99)

## 2021-02-11 MED ORDER — MUSCLE RUB 10-15 % EX CREA
TOPICAL_CREAM | Freq: Two times a day (BID) | CUTANEOUS | Status: DC
  Administered 2021-02-19: 1 via TOPICAL
  Filled 2021-02-11: qty 85

## 2021-02-11 MED ORDER — MUSCLE RUB 10-15 % EX CREA
TOPICAL_CREAM | Freq: Two times a day (BID) | CUTANEOUS | Status: DC
  Filled 2021-02-11: qty 85

## 2021-02-11 MED ORDER — METOPROLOL TARTRATE 50 MG PO TABS
100.0000 mg | ORAL_TABLET | Freq: Two times a day (BID) | ORAL | Status: DC
  Administered 2021-02-11 – 2021-02-26 (×30): 100 mg via ORAL
  Filled 2021-02-11 (×31): qty 2

## 2021-02-11 NOTE — Progress Notes (Signed)
Physical Therapy Weekly Progress Note  Patient Details  Name: Erik [name] MRN: [id number] Date of Birth: 08-30-1991  Beginning of progress report period: February 04, 2021 End of progress report period: February 11, 2021  Today's Date: 02/11/2021 PT Individual Time: [phone] and 1105-1203 PT Individual Time Calculation (min): 54 min and 58 min  Patient has met 4 of 4 short term goals.  Pt is progressing very well toward mobility goals, demonstrating significant improvements in all areas of functional mobility. Pt is buckling through lower extremities much less frequently than early in reporting period, and has increased ambulation distance significantly. Pt continues to fatigue quickly due to deconditioning and continues to require AD for balance and safety, though hopes to progress to ambulating without AD prior to DC.  Patient continues to demonstrate the following deficits muscle weakness, decreased cardiorespiratoy endurance and decreased sitting balance, decreased standing balance and decreased balance strategies and therefore will continue to benefit from skilled PT intervention to increase functional independence with mobility.  Patient progressing toward long term goals..  Continue plan of care.  PT Short Term Goals Week 1:  PT Short Term Goal 1 (Week 1): Pt will perform bed mobility at minA PT Short Term Goal 1 - Progress (Week 1): Met PT Short Term Goal 2 (Week 1): Pt will perform bed to chair transfers with modA PT Short Term Goal 2 - Progress (Week 1): Met PT Short Term Goal 3 (Week 1): Pt will perform sit to stand transfer with minA. PT Short Term Goal 3 - Progress (Week 1): Met PT Short Term Goal 4 (Week 1): Pt will ambulate x25' with modA +2. PT Short Term Goal 4 - Progress (Week 1): Met Week 2:  PT Short Term Goal 1 (Week 2): Pt will perform bed mobility consistently with supervision. PT Short Term Goal 2 (Week 2): Pt will perform sit to stand transfers consistently with  CGA. PT Short Term Goal 3 (Week 2): Pt will perform bed to chair transfer consistently with CGA. PT Short Term Goal 4 (Week 2): Pt will ambulate 150' with minA +1 and LRAD.  Skilled Therapeutic Interventions/Progress Updates:      Pt received supine in bed and agrees to therapy. Supine to sit with verbal cues on hand placement and body mechanics. Pt performs sit to stand and stand step transfer to [facility] with RW and minA. WC transport to gym for time management. In parallel bars, pt participates in NMR for standing balance, activity tolerance, and strengthening. Pt performs multiple reps of sit to stand with supervision and cues for hand placement and sequencing. Pt starts with marching in place x20 steps each. Following seated reset break, pt stands and practices lateral weight shifting without upper extremity support. Pt verbalizes feeling dizzy and has to sit back down, the verbalizing blurry vision. PT assesses vitals. HR initially 130 bpm with O2 100% on room air, with following blood pressures: Sitting 134/85 Standing 113/62 Sitting after several minutes had passed: 115/83 Pt educated on monitoring symptoms and taking breaks as necessary. Pt attempts several more stands and progressing weight shifting, but does not attempt gait this session due to dizziness. MD aware. Return to room. Stand step transfer to bed with CGA. Left supine with all needs within reach.  2nd Session: Pt received supine in bed and agrees to therapy. Supine to sit with cues on body mechanics. Stand step transfer to [facility] with RW and CGA. Pt performs NMR for standing balance and activity tolerance with Wii bowling  game, standing without Upper extremity support and performing high amplitude movements with R arm. Pt completes with supervision and takes x1 brief seated rest break during 10-frame game.  Pt performs balance and gait training in parallel bars. Sit to stand with supervision and lateral weight shifting progressing to  marching in place. Pt loses balance several times but is able to steady self using hands on parallel bars. Pt then performs 2x5' ambulation, attempting to not use arms for balance or weight bearing. Pt has to occasionally touch parallel bars but otherwise ambulates without AD. PT provides CGA and pt has close +2 WC follow for safety. No report of dizziness. Return to room. Stand step transfer to bed with CGA. Left supine with all needs within reach.    Therapy Documentation Precautions:  Precautions Precautions: Fall Restrictions Weight Bearing Restrictions: No   Therapy/Group: Individual Therapy  Breck Coons, PT, DPT 02/11/2021, 10:23 AM

## 2021-02-11 NOTE — Progress Notes (Signed)
Occupational Therapy Session Note  Patient Details  Name: RICH PAPROCKI MRN: [id number] Date of Birth: 11/13/1991  Today's Date: 02/11/2021 OT Individual Time: 1884-1660 OT Individual Time Calculation (min): 45 min   Short Term Goals: Week 2:  OT Short Term Goal 1 (Week 2): Pt will maintain standing at the sink with min A of 1 person for 2 minutes in prep for BADL task OT Short Term Goal 2 (Week 2): Pt will use AE for LB dressing and mod A OT Short Term Goal 3 (Week 2): Pt will complete toilet transfer with mod A of 1  Skilled Therapeutic Interventions/Progress Updates:    Pt greeted semi-reclined in bed and agreeable to OT treatment session. Pt completed bed mobility with supervision. Sit<.stand w/ RW and min A. Pt then ambulated to the sink and tolerated standing for toothbrushing task. Pt then brought down to therapy gym and completed 5 mins x2 forward and back on UE ergometer. Focus on L shoulder ROM and standing endurance with standing ring arch task. Pt needed guided A to achieve enough shoulder flexion with fatigue. Pt returned to room and ambulated  10 feet back to bed. Pt needed OT assist to doff pants from hips, then OT educated on use of reacher to remove pants off of LEs. Pt returned to bed and left semi-reclined with call bell in reach and needs met.   Therapy Documentation Precautions:  Precautions Precautions: Fall Restrictions Weight Bearing Restrictions: No Pain: Denies pain  Therapy/Group: Individual Therapy  Valma Cava 02/11/2021, 2:53 PM

## 2021-02-11 NOTE — Progress Notes (Signed)
PROGRESS NOTE   Subjective/Complaints:  Up with PT. Getting light-headed while standing in parallel bars. No other complaints this morning. Had a good night  ROS: Patient denies fever, rash, sore throat, blurred vision, nausea, vomiting, diarrhea, cough,   chest pain, joint or back pain, headache, or mood change.    Objective:   No results found. No results for input(s): WBC, HGB, HCT, PLT in the last 72 hours. No results for input(s): NA, K, CL, CO2, GLUCOSE, BUN, CREATININE, CALCIUM in the last 72 hours.  Intake/Output Summary (Last 24 hours) at 02/11/2021 0905 Last data filed at 02/11/2021 6767 Gross per 24 hour  Intake 1080 ml  Output 2850 ml  Net -1770 ml        Physical Exam: Vital Signs Blood pressure (!) 142/100, pulse 100, temperature 97.7 F (36.5 C), resp. rate 16, height 5\' 10"  (1.778 m), weight (!) 173.6 kg, SpO2 100 %.   Constitutional: No distress . Vital signs reviewed. obese HEENT: EOMI, oral membranes moist Neck: supple Cardiovascular: RRR without murmur. No JVD    Respiratory/Chest: CTA Bilaterally without wheezes or rales. Normal effort    GI/Abdomen: BS +, non-tender, non-distended Ext: no clubbing, cyanosis, or edema Psych: pleasant and cooperative Skin: intact Musculoskeletal:     Cervical back: Normal range of motion and neck supple.     Comments: No edema or tenderness in extremities  Skin:    General: Skin is warm and dry. MASD sacrum/buttocks--did not examine Neurological:     Alert and oriented x 3. Normal insight and awareness. Intact Memory. Normal language and speech. Cranial nerve exam unremarkable  Motor: 4+/5 RUE, LUE 3+ deltoid, 4-/5 tricep, bicep and 5/5 hand. BLE 4 to 4+/5 prox to 4+/5 distally. No sensory findings.            Assessment/Plan: 1. Functional deficits which require 3+ hours per day of interdisciplinary therapy in a comprehensive inpatient rehab  setting.  Physiatrist is providing close team supervision and 24 hour management of active medical problems listed below.  Physiatrist and rehab team continue to assess barriers to discharge/monitor patient progress toward functional and medical goals  Care Tool:  Bathing    Body parts bathed by patient: Chest,Abdomen,Front perineal area,Face,Left arm   Body parts bathed by helper: Right arm,Buttocks,Right upper leg,Left upper leg,Right lower leg,Left lower leg     Bathing assist Assist Level: Total Assistance - Patient < 25%     Upper Body Dressing/Undressing Upper body dressing   What is the patient wearing?: Hospital gown only    Upper body assist Assist Level: Maximal Assistance - Patient 25 - 49%    Lower Body Dressing/Undressing Lower body dressing      What is the patient wearing?: Incontinence brief     Lower body assist Assist for lower body dressing: Dependent - Patient 0%     Toileting Toileting    Toileting assist Assist for toileting: Dependent - Patient 0%     Transfers Chair/bed transfer  Transfers assist     Chair/bed transfer assist level: Contact Guard/Touching assist     Locomotion Ambulation   Ambulation assist      Assist level: Minimal Assistance -  Patient > 75% Assistive device: Walker-rolling Max distance: 50'   Walk 10 feet activity   Assist  Walk 10 feet activity did not occur: Safety/medical concerns  Assist level: Minimal Assistance - Patient > 75% Assistive device: Walker-rolling   Walk 50 feet activity   Assist Walk 50 feet with 2 turns activity did not occur: Safety/medical concerns  Assist level: Minimal Assistance - Patient > 75% Assistive device: Walker-rolling    Walk 150 feet activity   Assist Walk 150 feet activity did not occur: Safety/medical concerns         Walk 10 feet on uneven surface  activity   Assist Walk 10 feet on uneven surfaces activity did not occur: Safety/medical  concerns         Wheelchair     Assist Will patient use wheelchair at discharge?: No             Wheelchair 50 feet with 2 turns activity    Assist            Wheelchair 150 feet activity     Assist          Blood pressure (!) 142/100, pulse 100, temperature 97.7 F (36.5 C), resp. rate 16, height 5\' 10"  (1.778 m), weight (!) 173.6 kg, SpO2 100 %.    Medical Problem List and Plan: 1.  Debility secondary to COVID-19 with course complicated by MI/STEMI as well as AKI             -patient may shower             -ELOS/Goals: 14-17 days/supervision/min a            --Continue CIR therapies including PT, OT. Team conference today 2.  Antithrombotics: -DVT/anticoagulation: Continue Lovenox 82.5 mg daily             -antiplatelet therapy: Aspirin 81 mg daily and Plavix 85 mg daily 3. Pain Management: Continue Oxycodone as needed. Add kpad for right sided upper back muscular pain.  3/1 con't regimen- focus on Kpad                4. Mood: Provide emotional support             -antipsychotic agents: N/A 5. Neuropsych: This patient is capable of making decisions on his own behalf. 6. Skin/Wound Care: Routine skin checks  -local care to backside  -  specialty mattress in place 7. Fluids/Electrolytes/Nutrition: Routine in and outs 8.  AKI.  Renal function improved.  CRRT discontinued.  2/26 Cr 1.59-   has been trending downwards with every BMP- down to 1.59- follow regularly  -recheck labs Friday 9.  COVID-19.  Patient completed course of remdesivir as well as steroids.  Contact precautions discontinued 10.  Super obesity.  BMI 53.52 dietary follow-up 11.  Newly diagnosed uncontrolled diabetes mellitus.  Hemoglobin A1c 13.6.  Continue NovoLog 8 units 3 times daily, Levemir 37 units daily  3/3 reasonable control still 12.  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 tach.  Cardiology follow-up.  Cardiac catheterization 01/20/2021 no  significant CAD.               Continue Metoprolol 50 mg twice daily.  3/3 BP/HR still elevated at rest. Experiencing orthostasis when up however.   -increase metoprolol to 100mg  bid   -continue to work on acclimation, pacing   -consider TEDS/ not sure how much abd binder would help given his size 13.  Hyperlipidemia: Lipitor  LOS: 8 days A FACE TO FACE EVALUATION WAS PERFORMED  Ranelle Oyster 02/11/2021, 9:05 AM

## 2021-02-11 NOTE — Progress Notes (Signed)
Occupational Therapy Weekly Progress Note  Patient Details  Name: Erik [name] MRN: [id number] Date of Birth: December 25, 1990    Beginning of progress report period: February 04, 2021 End of progress report period: February 11, 2021   Patient has met 3 of 3 short term goals.  Pt is making great progress towards OT goals. Pt requires overall mod A for BADL tasks and transfers. Pt is progressing with sit<>stands with a RW, but still has occasional weakness ane knee buckling at times. We are working on modified strategies for LB bathing.dressing. Overall activity tolerance Is improving as well. Continue current POC.  Patient continues to demonstrate the following deficits: muscle weakness, decreased cardiorespiratoy endurance and decreased standing balance and decreased balance strategies and therefore will continue to benefit from skilled OT intervention to enhance overall performance with BADL.  Patient progressing toward long term goals..  Continue plan of care.  OT Short Term Goals Week 1:  OT Short Term Goal 1 (Week 1): Patient will complete sit<>stand with max A of 1 person OT Short Term Goal 1 - Progress (Week 1): Met OT Short Term Goal 2 (Week 1): Patient will maintain standing in [place] for 1 minute in prep for BADL task OT Short Term Goal 2 - Progress (Week 1): Met OT Short Term Goal 3 (Week 1): Pt will complete 1 step up LB dressing task OT Short Term Goal 3 - Progress (Week 1): Met Week 2:  OT Short Term Goal 1 (Week 2): Pt will maintain standing at the sink with min A of 1 person for 2 minutes in prep for BADL task OT Short Term Goal 2 (Week 2): Pt will use AE for LB dressing and mod A OT Short Term Goal 3 (Week 2): Pt will complete toilet transfer with mod A of 1    Therapy/Group: Individual Therapy  Valma Cava 02/11/2021, 10:29 AM

## 2021-02-11 NOTE — Progress Notes (Signed)
Nutrition Follow-up  DOCUMENTATION CODES:   Morbid obesity  INTERVENTION:   - Continue Ensure Max po BID, each supplement provides 150 kcal and 30 grams of protein  - Continue ouble protein portions TID with meals  - Continue MVI with minerals daily  - Encourage adequate PO intake  NUTRITION DIAGNOSIS:   Increased nutrient needs related to catabolic illness (recent COVID-19 infection) as evidenced by estimated needs.  Ongoing  GOAL:   Patient will meet greater than or equal to 90% of their needs  Progressing  MONITOR:   PO intake,Supplement acceptance,Labs,Weight trends,I & O's  REASON FOR ASSESSMENT:   Malnutrition Screening Tool    ASSESSMENT:   30 year old male with unremarkable PMH. Presented on 01/12/21 with progressive weakness, poor PO intake, lethargy x 2 weeks. Pt admitted with COVID-19. Pt did initially require intubation for airway protection. Renal service was consulted and CRRT initiated for AKI which has now resolved. Pt admitted to CIR on 2/23.  Noted target d/c date of 3/18.  Pt unavailable at time of RD visit. Noted excellent meal completion with 8 out of last 8 meal completions documented as 100%. Pt accepting >90% of Ensure Max supplements per [facility] documentation. Will continue with current supplement regimen.  Weight has fluctuated between 166-173 kg since admission. Suspect weight fluctuations related fluid status given documentation of non-pitting edema to BUE and BLE.  Meal Completion: 100%  Medications reviewed and include: pepcid, SSI TID with meals and at bedtime. Novolog 8 units TID with meals, levemir 37 units daily, MVI with minerals, Ensure Max BID  Labs reviewed: phosphors 5.3, hemoglobin 9.3 CBG's: 108-129 x 24 hours  UOP: 2850 ml x 24 hours  Diet Order:   Diet Order            Diet Carb Modified Fluid consistency: Thin; Room service appropriate? Yes  Diet effective now                 EDUCATION NEEDS:   No education  needs have been identified at this time  Skin:  Skin Assessment:  Skin Integrity Issues: Other: skin tear to vertebral column, MASD to buttocks  Last BM:  02/11/21 large type 4  Height:   Ht Readings from Last 1 Encounters:  02/03/21 5\' 10"  (1.778 m)    Weight:   Wt Readings from Last 1 Encounters:  02/11/21 (!) 173.6 kg    BMI:  Body mass index is 54.91 kg/m.  Estimated Nutritional Needs:   Kcal:  2700-2900  Protein:  150-170 grams  Fluid:  >/= 2.5 L    04/13/21, MS, RD, LDN Inpatient Clinical Dietitian Please see [facility] for contact information.

## 2021-02-12 LAB — BASIC METABOLIC PANEL
Anion gap: 12 (ref 5–15)
BUN: 20 mg/dL (ref 6–20)
CO2: 21 mmol/L — ABNORMAL LOW (ref 22–32)
Calcium: 9.7 mg/dL (ref 8.9–10.3)
Chloride: 107 mmol/L (ref 98–111)
Creatinine, Ser: 1.65 mg/dL — ABNORMAL HIGH (ref 0.61–1.24)
GFR, Estimated: 57 mL/min — ABNORMAL LOW (ref >60)
Glucose, Bld: 112 mg/dL — ABNORMAL HIGH (ref 70–99)
Potassium: 4 mmol/L (ref 3.5–5.1)
Sodium: 140 mmol/L (ref 135–145)

## 2021-02-12 LAB — GLUCOSE, CAPILLARY
Glucose-Capillary: 105 mg/dL — ABNORMAL HIGH (ref 70–99)
Glucose-Capillary: 112 mg/dL — ABNORMAL HIGH (ref 70–99)
Glucose-Capillary: 118 mg/dL — ABNORMAL HIGH (ref 70–99)
Glucose-Capillary: 117 mg/dL — ABNORMAL HIGH (ref 70–99)

## 2021-02-12 LAB — CBC
HCT: 31 % — ABNORMAL LOW (ref 39.0–52.0)
Hemoglobin: 9.4 g/dL — ABNORMAL LOW (ref 13.0–17.0)
MCH: 26.1 pg (ref 26.0–34.0)
MCHC: 30.3 g/dL (ref 30.0–36.0)
MCV: 86.1 fL (ref 80.0–100.0)
Platelets: 442 10*3/uL — ABNORMAL HIGH (ref 150–400)
RBC: 3.6 MIL/uL — ABNORMAL LOW (ref 4.22–5.81)
RDW: 15.3 % (ref 11.5–15.5)
WBC: 6 10*3/uL (ref 4.0–10.5)
nRBC: 0 % (ref 0.0–0.2)

## 2021-02-12 NOTE — Progress Notes (Signed)
Physical Therapy Session Note  Patient Details  Name: Erik [name] MRN: [id number] Date of Birth: 27-Mar-1991  Today's Date: 02/12/2021 PT Individual Time: 1420-1530 PT Individual Time Calculation (min): 70 min   Short Term Goals: Week 2:  PT Short Term Goal 1 (Week 2): Pt will perform bed mobility consistently with supervision. PT Short Term Goal 2 (Week 2): Pt will perform sit to stand transfers consistently with CGA. PT Short Term Goal 3 (Week 2): Pt will perform bed to chair transfer consistently with CGA. PT Short Term Goal 4 (Week 2): Pt will ambulate 150' with minA +1 and LRAD.  Skilled Therapeutic Interventions/Progress Updates:     Pt received seated in [facility] and agrees to therapy. No complaint of pain. Sit to stand and stand step transfer to [facility] with RW CGA and cues for sequencing and positioning. WC transport to gym for time management. Pt performs seated therex with bilateral upper extremities, including 1x10 arm raises with 4lb bar (PT provides AAROM due to L arm weakness), 2x5 trunk rotations holding bar in front of chest with shoulders at 90 degree angle and elbows extended, and 1x15 seated "deadlifts". Following, pt performs NMR for standing balance and activity tolerance, tossing 1 kg ball at trampoline and catching rebound. Pt has no upper extremity support and PT provides CGA for safety. Pt completes 3x25 reps with seated rest breaks. Pt's HR during rest breaks ~120 decreasing to ~105 with several minutes rest. Pt ambulates 100' with RW and CGA, with cues for posture and monitoring level of exertion. Following, pt trials rollator and ambulates 39' and 105' with CGA +1 and +2 WC follow. WC transport back to room. Pt performs stand step transfer back to bed with no AD and with CGA. Left supine in bed with alarm intact and all needs within reach.  Therapy Documentation Precautions:  Precautions Precautions: Fall Restrictions Weight Bearing Restrictions: No   Therapy/Group:  Individual Therapy  Beau Fanny, PT, DPT 02/12/2021, 4:03 PM

## 2021-02-12 NOTE — Progress Notes (Signed)
Occupational Therapy Session Note  Patient Details  Name: Erik [name] MRN: [id number] Date of Birth: 10-16-1991  Today's Date: 02/12/2021 OT Individual Time: 4949-4473 OT Individual Time Calculation (min): 57 min    Short Term Goals: Week 2:  OT Short Term Goal 1 (Week 2): Pt will maintain standing at the sink with min A of 1 person for 2 minutes in prep for BADL task OT Short Term Goal 2 (Week 2): Pt will use AE for LB dressing and mod A OT Short Term Goal 3 (Week 2): Pt will complete toilet transfer with mod A of 1  Skilled Therapeutic Interventions/Progress Updates:    Pt greeted semi-reclined in bed and agreeable to OT treatment session. Pt completed bed mobility with supervision. Pt then stood w/ RW and min A. Pt ambualted into bathroom and transferred onto bariatric BSC over toilet. Assistance for clothing management. Pt with successful BM. Worked on hygiene in standing with pt able to perform peri-care in standing with min A for dynamic balance. Bathing completed from bariatric BSC in shower with min A to wash lower legs and buttocks. Stand-pivot out of shower min A. Intermittent bouts of dizziness in standing, but improved with seated rest break.  Worked on standing balance/endurance with standing grooming tasks. Pt ambulated 5 feet back to bed and left semi-reclined in bed with bed alarm on, call bell in reach, and needs met.   Therapy Documentation Precautions:  Precautions Precautions: Fall Restrictions Weight Bearing Restrictions: No Pain:  denies pain  Therapy/Group: Individual Therapy  Valma Cava 02/12/2021, 8:37 AM

## 2021-02-12 NOTE — Progress Notes (Signed)
PROGRESS NOTE   Subjective/Complaints:  Became light headed when standing up to use toilet this morning. Symptoms resolved when he sat down and didn't recur when he stood back up.   ROS: Patient denies fever, rash, sore throat,  nausea, vomiting, diarrhea, cough, shortness of breath or chest pain, joint or back pain, headache, or mood change.    Objective:   No results found. Recent Labs    02/12/21 0652  WBC 6.0  HGB 9.4*  HCT 31.0*  PLT 442*   Recent Labs    02/12/21 0652  NA 140  K 4.0  CL 107  CO2 21*  GLUCOSE 112*  BUN 20  CREATININE 1.65*  CALCIUM 9.7    Intake/Output Summary (Last 24 hours) at 02/12/2021 1006 Last data filed at 02/12/2021 0605 Gross per 24 hour  Intake 240 ml  Output 1900 ml  Net -1660 ml        Physical Exam: Vital Signs Blood pressure (!) 154/99, pulse 100, temperature 98.1 F (36.7 C), resp. rate 20, height 5\' 10"  (1.778 m), weight (!) 173.3 kg, SpO2 99 %.   Constitutional: No distress . Vital signs reviewed. obese HEENT: EOMI, oral membranes moist Neck: supple Cardiovascular: RRR without murmur. No JVD    Respiratory/Chest: CTA Bilaterally without wheezes or rales. Normal effort    GI/Abdomen: BS +, non-tender, non-distended Ext: no clubbing, cyanosis, or edema Psych: pleasant and cooperative Skin: intact Musculoskeletal:     Cervical back: Normal range of motion and neck supple.     Comments: No edema or tenderness in extremities  Skin:    General: Skin is warm and dry. MASD sacrum/buttocks--stable in appearance Neurological:     Alert and oriented x 3. Normal insight and awareness. Intact Memory. Normal language and speech. Cranial nerve exam unremarkable  Motor: 4+/5 RUE, LUE 4- deltoid, 4+/5 tricep, bicep and 5/5 hand. BLE 4 to 4+/5 prox to 4+/5 distally. Sensory exam normal.            Assessment/Plan: 1. Functional deficits which require 3+ hours per day of  interdisciplinary therapy in a comprehensive inpatient rehab setting.  Physiatrist is providing close team supervision and 24 hour management of active medical problems listed below.  Physiatrist and rehab team continue to assess barriers to discharge/monitor patient progress toward functional and medical goals  Care Tool:  Bathing    Body parts bathed by patient: Chest,Abdomen,Front perineal area,Face,Left arm   Body parts bathed by helper: Right arm,Buttocks,Right upper leg,Left upper leg,Right lower leg,Left lower leg     Bathing assist Assist Level: Total Assistance - Patient < 25%     Upper Body Dressing/Undressing Upper body dressing   What is the patient wearing?: Hospital gown only    Upper body assist Assist Level: Maximal Assistance - Patient 25 - 49%    Lower Body Dressing/Undressing Lower body dressing      What is the patient wearing?: Incontinence brief     Lower body assist Assist for lower body dressing: Dependent - Patient 0%     Toileting Toileting    Toileting assist Assist for toileting: Dependent - Patient 0%     Transfers Chair/bed transfer  Transfers assist     Chair/bed transfer assist level: Contact Guard/Touching assist     Locomotion Ambulation   Ambulation assist      Assist level: 2 helpers Assistive device: No Device Max distance: 5'   Walk 10 feet activity   Assist  Walk 10 feet activity did not occur: Safety/medical concerns  Assist level: Minimal Assistance - Patient > 75% Assistive device: Walker-rolling   Walk 50 feet activity   Assist Walk 50 feet with 2 turns activity did not occur: Safety/medical concerns  Assist level: Minimal Assistance - Patient > 75% Assistive device: Walker-rolling    Walk 150 feet activity   Assist Walk 150 feet activity did not occur: Safety/medical concerns         Walk 10 feet on uneven surface  activity   Assist Walk 10 feet on uneven surfaces activity did not  occur: Safety/medical concerns         Wheelchair     Assist Will patient use wheelchair at discharge?: No             Wheelchair 50 feet with 2 turns activity    Assist            Wheelchair 150 feet activity     Assist          Blood pressure (!) 154/99, pulse 100, temperature 98.1 F (36.7 C), resp. rate 20, height 5\' 10"  (1.778 m), weight (!) 173.3 kg, SpO2 99 %.    Medical Problem List and Plan: 1.  Debility secondary to COVID-19 with course complicated by MI/STEMI as well as AKI             -patient may shower             -ELOS/Goals: 14-17 days/supervision/min a            --Continue CIR therapies including PT, OT.   2.  Antithrombotics: -DVT/anticoagulation: Continue Lovenox 82.5 mg daily             -antiplatelet therapy: Aspirin 81 mg daily and Plavix 85 mg daily 3. Pain Management: Continue Oxycodone as needed. \  3/4 con't regimen- has Kpad as well               4. Mood: Provide emotional support             -antipsychotic agents: N/A 5. Neuropsych: This patient is capable of making decisions on his own behalf. 6. Skin/Wound Care: Routine skin checks  -local care to backside  - specialty mattress in place 7. Fluids/Electrolytes/Nutrition: Routine in and outs 8.  AKI.  Renal function improved.  CRRT discontinued.  3/4 BUN/Cr continues general trend down--> 20/1.65 today 9.  COVID-19.  Patient completed course of remdesivir as well as steroids.  Contact precautions discontinued 10.  Super obesity.  BMI 53.52 dietary follow-up 11.  Newly diagnosed uncontrolled diabetes mellitus.  Hemoglobin A1c 13.6.  Continue NovoLog 8 units 3 times daily, Levemir 37 units daily  3/4 reasonable control  12.  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 tach.  Cardiology follow-up.  Cardiac catheterization 01/20/2021 no significant CAD.               3/4 BP/HR remain elevated at rest. Experiencing light-headedness when up at times,  however. I witnessed lower BP during an episode yesterday with therapy   -increased metoprolol to 100mg  bid 3/3   -continue to work on acclimation, pacing, sit EOB before OOB   -  will try THT (if they can be appropriately placed on him)   -record orthostatic VS daily   -if sx persists and he's truly orthostatic may need to consult cardiology for recs 13.  Hyperlipidemia: Lipitor  LOS: 9 days A FACE TO FACE EVALUATION WAS PERFORMED  Ranelle Oyster 02/12/2021, 10:06 AM

## 2021-02-12 NOTE — Progress Notes (Signed)
Occupational Therapy Session Note  Patient Details  Name: Erik [name] MRN: [id number] Date of Birth: 1991-04-05  Today's Date: 02/12/2021 OT Individual Time: 2778-2423 OT Individual Time Calculation (min): 30 min    Short Term Goals: Week 2:  OT Short Term Goal 1 (Week 2): Pt will maintain standing at the sink with min A of 1 person for 2 minutes in prep for BADL task OT Short Term Goal 2 (Week 2): Pt will use AE for LB dressing and mod A OT Short Term Goal 3 (Week 2): Pt will complete toilet transfer with mod A of 1  Skilled Therapeutic Interventions/Progress Updates:    Pt completed dressing sit to stand from the EOB with supervision for UB.  He was able to donn his shorts with mod assist sit to stand and then his socks with supervision crossing one LE over the other.  He then completes donning his shoes with mod assist including tying them.  Completed transfer from the bed to the recliner with mod assist.  Worked on sit to stand from the recliner with max assist.  Therapist added pillows to the seat of the chair to increase height to make it easier to complete sit to stand.  Finished session with pt sitting in the wheelchair and with the call button and phone in reach.    Therapy Documentation Precautions:  Precautions Precautions: Fall Restrictions Weight Bearing Restrictions: No  Pain:   No report of pain  ADL: See Care Tool Section for Some details of mobility and selfcareOther Treatments:     Therapy/Group: Individual Therapy  MCGUIRE,JAMES OTR/L 02/12/2021, 3:45 PM

## 2021-02-12 NOTE — Plan of Care (Signed)
  Problem: RH BOWEL ELIMINATION Goal: RH STG MANAGE BOWEL WITH ASSISTANCE Description: STG Manage Bowel with Mod Assistance. Outcome: Progressing   Problem: RH BLADDER ELIMINATION Goal: RH STG MANAGE BLADDER WITH ASSISTANCE Description: STG Manage Bladder With Mod Assistance Outcome: Progressing   Problem: RH SKIN INTEGRITY Goal: RH STG SKIN FREE OF INFECTION/BREAKDOWN Description: Skin will remain free of infection or breakdown with mod assist. Outcome: Progressing   Problem: RH PAIN MANAGEMENT Goal: RH STG PAIN MANAGED AT OR BELOW PT'S PAIN GOAL Description: Patient will remain with pain less than 2 during stay.  Outcome: Progressing   Problem: Consults Goal: RH GENERAL PATIENT EDUCATION Description: See Patient Education module for education specifics. Outcome: Progressing   

## 2021-02-13 LAB — GLUCOSE, CAPILLARY
Glucose-Capillary: 129 mg/dL — ABNORMAL HIGH (ref 70–99)
Glucose-Capillary: 136 mg/dL — ABNORMAL HIGH (ref 70–99)
Glucose-Capillary: 143 mg/dL — ABNORMAL HIGH (ref 70–99)
Glucose-Capillary: 122 mg/dL — ABNORMAL HIGH (ref 70–99)

## 2021-02-13 NOTE — Plan of Care (Signed)
  Problem: RH BOWEL ELIMINATION Goal: RH STG MANAGE BOWEL WITH ASSISTANCE Description: STG Manage Bowel with Mod Assistance. Outcome: Progressing   Problem: RH BLADDER ELIMINATION Goal: RH STG MANAGE BLADDER WITH ASSISTANCE Description: STG Manage Bladder With Mod Assistance Outcome: Progressing   Problem: RH SKIN INTEGRITY Goal: RH STG SKIN FREE OF INFECTION/BREAKDOWN Description: Skin will remain free of infection or breakdown with mod assist. Outcome: Progressing   Problem: RH PAIN MANAGEMENT Goal: RH STG PAIN MANAGED AT OR BELOW PT'S PAIN GOAL Description: Patient will remain with pain less than 2 during stay.  Outcome: Progressing   Problem: Consults Goal: RH GENERAL PATIENT EDUCATION Description: See Patient Education module for education specifics. Outcome: Progressing   

## 2021-02-14 LAB — GLUCOSE, CAPILLARY
Glucose-Capillary: 99 mg/dL (ref 70–99)
Glucose-Capillary: 149 mg/dL — ABNORMAL HIGH (ref 70–99)
Glucose-Capillary: 76 mg/dL (ref 70–99)
Glucose-Capillary: 114 mg/dL — ABNORMAL HIGH (ref 70–99)

## 2021-02-14 NOTE — Progress Notes (Signed)
PROGRESS NOTE   Subjective/Complaints:  No issues overnight, still gets shortness of breath in therapy.  Feels like his left shoulder and arm are getting stronger   ROS: Patient denies CP, SOB, N/V/D   Objective:   No results found. Recent Labs    02/12/21 0652  WBC 6.0  HGB 9.4*  HCT 31.0*  PLT 442*   Recent Labs    02/12/21 0652  NA 140  K 4.0  CL 107  CO2 21*  GLUCOSE 112*  BUN 20  CREATININE 1.65*  CALCIUM 9.7    Intake/Output Summary (Last 24 hours) at 02/14/2021 1050 Last data filed at 02/14/2021 0900 Gross per 24 hour  Intake 850 ml  Output 1253 ml  Net -403 ml        Physical Exam: Vital Signs Blood pressure (!) 156/102, pulse 96, temperature (!) 97.5 F (36.4 C), temperature source Oral, resp. rate 20, height 5\' 10"  (1.778 m), weight (!) 180.1 kg, SpO2 99 %.  General: No acute distress Mood and affect are appropriate Heart: Regular rate and rhythm no rubs murmurs or extra sounds Lungs: Clear to auscultation, breathing unlabored, no rales or wheezes Abdomen: Positive bowel sounds, soft nontender to palpation, nondistended Extremities: No clubbing, cyanosis, or edema Skin: No evidence of breakdown, no evidence of rash  Musculoskeletal:     Cervical back: Normal range of motion and neck supple.     Comments: No edema or tenderness in extremities  Skin:    General: Skin is warm and dry. MASD sacrum/buttocks--stable in appearance Neurological:     Alert and oriented x 3. Normal insight and awareness. Intact Memory. Normal language and speech. Cranial nerve exam unremarkable  Motor: 4+/5 RUE, LUE 3- deltoid, 4+/5 tricep, bicep and 5/5 hand. BLE 4 to 4+/5 prox to 4+/5 distally. Sensory exam normal.  Unchanged           Assessment/Plan: 1. Functional deficits which require 3+ hours per day of interdisciplinary therapy in a comprehensive inpatient rehab setting.  Physiatrist is providing close  team supervision and 24 hour management of active medical problems listed below.  Physiatrist and rehab team continue to assess barriers to discharge/monitor patient progress toward functional and medical goals  Care Tool:  Bathing  Bathing activity did not occur: Refused Body parts bathed by patient: Chest,Abdomen,Front perineal area,Face,Left arm   Body parts bathed by helper: Right arm,Buttocks,Right upper leg,Left upper leg,Right lower leg,Left lower leg     Bathing assist Assist Level: Total Assistance - Patient < 25%     Upper Body Dressing/Undressing Upper body dressing   What is the patient wearing?: Pull over shirt    Upper body assist Assist Level: Set up assist    Lower Body Dressing/Undressing Lower body dressing      What is the patient wearing?: Pants,Underwear/pull up     Lower body assist Assist for lower body dressing: Contact Guard/Touching assist     Toileting Toileting    Toileting assist Assist for toileting: Contact Guard/Touching assist     Transfers Chair/bed transfer  Transfers assist     Chair/bed transfer assist level: Contact Guard/Touching assist     Locomotion Ambulation  Ambulation assist      Assist level: Contact Guard/Touching assist Assistive device: Walker-rolling Max distance: 100'   Walk 10 feet activity   Assist  Walk 10 feet activity did not occur: Safety/medical concerns  Assist level: Contact Guard/Touching assist Assistive device: Walker-rolling   Walk 50 feet activity   Assist Walk 50 feet with 2 turns activity did not occur: Safety/medical concerns  Assist level: Contact Guard/Touching assist Assistive device: Walker-rolling    Walk 150 feet activity   Assist Walk 150 feet activity did not occur: Safety/medical concerns         Walk 10 feet on uneven surface  activity   Assist Walk 10 feet on uneven surfaces activity did not occur: Safety/medical concerns          Wheelchair     Assist Will patient use wheelchair at discharge?: No             Wheelchair 50 feet with 2 turns activity    Assist            Wheelchair 150 feet activity     Assist          Blood pressure (!) 156/102, pulse 96, temperature (!) 97.5 F (36.4 C), temperature source Oral, resp. rate 20, height 5\' 10"  (1.778 m), weight (!) 180.1 kg, SpO2 99 %.    Medical Problem List and Plan: 1.  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   2.  Antithrombotics: -DVT/anticoagulation: Continue Lovenox 82.5 mg daily             -antiplatelet therapy: Aspirin 81 mg daily and Plavix 85 mg daily 3. Pain Management: Continue Oxycodone as needed. \  3/4 con't regimen- has Kpad as well               4. Mood: Provide emotional support             -antipsychotic agents: N/A 5. Neuropsych: This patient is capable of making decisions on his own behalf. 6. Skin/Wound Care: Routine skin checks  -local care to backside  - specialty mattress in place 7. Fluids/Electrolytes/Nutrition: Routine in and outs 8.  AKI.  Renal function improved.  CRRT discontinued.  3/4 BUN/Cr continues general trend down--> 20/1.65 today 9.  COVID-19.  Patient completed course of remdesivir as well as steroids.  Contact precautions discontinued 10.  Super obesity.  BMI 53.52 dietary follow-up 11.  Newly diagnosed uncontrolled diabetes mellitus.  Hemoglobin A1c 13.6.  Continue NovoLog 8 units 3 times daily, Levemir 37 units daily  3/4 reasonable control  12.  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 tach.  Cardiology follow-up.  Cardiac catheterization 01/20/2021 no significant  CAD.               3/4 BP/HR remain elevated at rest. Experiencing light-headedness when up at times, however. I witnessed lower BP during an episode yesterday with therapy   -increased metoprolol to 100mg  bid 3/3   -continue to work on acclimation, pacing, sit EOB before OOB   -will try THT (if they can be  appropriately placed on him)   -record orthostatic VS daily   -if sx persists and he's truly orthostatic may need to consult cardiology for recs 13.  Hyperlipidemia: Lipitor  LOS: 11 days A FACE TO FACE EVALUATION WAS PERFORMED  Erick Colace 02/14/2021, 10:50 AM

## 2021-02-14 NOTE — Plan of Care (Signed)
  Problem: RH BOWEL ELIMINATION Goal: RH STG MANAGE BOWEL WITH ASSISTANCE Description: STG Manage Bowel with Mod Assistance. Outcome: Progressing   Problem: RH BLADDER ELIMINATION Goal: RH STG MANAGE BLADDER WITH ASSISTANCE Description: STG Manage Bladder With Mod Assistance Outcome: Progressing   Problem: RH SKIN INTEGRITY Goal: RH STG SKIN FREE OF INFECTION/BREAKDOWN Description: Skin will remain free of infection or breakdown with mod assist. Outcome: Progressing   Problem: RH PAIN MANAGEMENT Goal: RH STG PAIN MANAGED AT OR BELOW PT'S PAIN GOAL Description: Patient will remain with pain less than 2 during stay.  Outcome: Progressing   Problem: Consults Goal: RH GENERAL PATIENT EDUCATION Description: See Patient Education module for education specifics. Outcome: Progressing   

## 2021-02-14 NOTE — Progress Notes (Signed)
Occupational Therapy Session Note  Patient Details  Name: Erik [name] MRN: [id number] Date of Birth: 04-May-1991  Today's Date: 02/14/2021 OT Individual Time: 6144-3154 OT Individual Time Calculation (min): 56 min   Short Term Goals: Week 2:  OT Short Term Goal 1 (Week 2): Pt will maintain standing at the sink with min A of 1 person for 2 minutes in prep for BADL task OT Short Term Goal 2 (Week 2): Pt will use AE for LB dressing and mod A OT Short Term Goal 3 (Week 2): Pt will complete toilet transfer with mod A of 1  Skilled Therapeutic Interventions/Progress Updates:    Pt greeted in bed with no c/o pain. Declining shower, opting to instead start session by using the restroom. He transitioned towards EOB while using the bedrail unassisted. CGA for ambulatory transfer to the elevated toilet using RW where pt had continent B+B void. CGA for toileting tasks, pt changing into clean underwear and pants, threading LEs into LB garments while seated without use of the reacher. Afterwards he ambulated to the sink and completed hand washing as well as oral care while seated and also while standing with the RW. He doffed/donned gripper socks utilizing seated figure 4 position and foot partially supported on the walker with setup assist. Focus was then placed on dynamic standing balance while ambulating to the dayroom and then back to his room, pt ambulating in total ~370 ft! He used the RW with CGA and w/c follow, vcs for forward gaze. He required seated rest breaks after 153 ft and also at 280 ft mark. 02 sats 100% post ambulation on RA. Pt transferred to bed at close of session, very proud of his accomplishments today and asking OT to relay his walking distance to primary PT. Pt remained comfortably in bed with all needs within reach. Tx focus placed on ADL retraining, dynamic balance, functional transfers.    Therapy Documentation Precautions:  Precautions Precautions: Fall Restrictions Weight Bearing  Restrictions: No ADL: ADL Eating: Set up Grooming: Setup Upper Body Bathing: Moderate assistance Lower Body Bathing: Dependent Upper Body Dressing: Maximal assistance Lower Body Dressing: Dependent Toileting: Dependent      Therapy/Group: Individual Therapy  Jaremy Nosal A Andron Marrazzo 02/14/2021, 12:24 PM

## 2021-02-14 NOTE — Progress Notes (Signed)
Physical Therapy Session Note  Patient Details  Name: Erik [name] MRN: [id number] Date of Birth: Sep 22, 1991  Today's Date: 02/14/2021 PT Individual Time: 0277-4128 PT Individual Time Calculation (min): 56 min   Short Term Goals: Week 2:  PT Short Term Goal 1 (Week 2): Pt will perform bed mobility consistently with supervision. PT Short Term Goal 2 (Week 2): Pt will perform sit to stand transfers consistently with CGA. PT Short Term Goal 3 (Week 2): Pt will perform bed to chair transfer consistently with CGA. PT Short Term Goal 4 (Week 2): Pt will ambulate 150' with minA +1 and LRAD.  Skilled Therapeutic Interventions/Progress Updates:     Pt received supine in bed and agrees to therapy. No complaint of pain. Supine to sit with bed features and cues on positioning. Stand step transfer to [facility] with CGA and RW. WC transport outside for time management. Pt ambulates outdoors for gait training in open environment over varying and unlevel surface types. Pt ambulates 140'x2 and 180' with CGA/minA. PT cues for upright gaze to improve posture and balance, and monitoring level of exertion to increase safety awareness. Pt takes extended seated rest breaks between each bout. WC transport back to gym. Pt performs sidestepping in parallel bars for NMR for balance and strengthening of hip abductors. Pt completes 2x20', starting with bilateral upper extremity support and progressing to no upper extremity support, with PT providing CGA. Pt performs 1x10 heel raises in parallel bars but is unable to clear heels during exercise.   WC transport back to room. Stand step transfer with RW and CGA. Sit to supine with cues on positioning. Left with all needs within reach.  Therapy Documentation Precautions:  Precautions Precautions: Fall Restrictions Weight Bearing Restrictions: No   Therapy/Group: Individual Therapy  Beau Fanny, PT, DPT 02/14/2021, 3:50 PM

## 2021-02-15 LAB — GLUCOSE, CAPILLARY
Glucose-Capillary: 95 mg/dL (ref 70–99)
Glucose-Capillary: 101 mg/dL — ABNORMAL HIGH (ref 70–99)
Glucose-Capillary: 92 mg/dL (ref 70–99)
Glucose-Capillary: 109 mg/dL — ABNORMAL HIGH (ref 70–99)

## 2021-02-15 NOTE — Progress Notes (Signed)
Physical Therapy Session Note  Patient Details  Name: Erik [name] MRN: [id number] Date of Birth: 1991-01-26  Today's Date: 02/15/2021 PT Individual Time: 1004-1100 PT Individual Time Calculation (min): 56 min   Short Term Goals: Week 2:  PT Short Term Goal 1 (Week 2): Pt will perform bed mobility consistently with supervision. PT Short Term Goal 2 (Week 2): Pt will perform sit to stand transfers consistently with CGA. PT Short Term Goal 3 (Week 2): Pt will perform bed to chair transfer consistently with CGA. PT Short Term Goal 4 (Week 2): Pt will ambulate 150' with minA +1 and LRAD.  Skilled Therapeutic Interventions/Progress Updates:     Pt received seated in WC and agrees to therapy> No complaint of pain. WC transport to gym for time management. Pt performs NMR for standing balance and activity tolerance, using Wii bowling ame. Pt stands without Upper extremity support and performs high amplitude movements with R arm. Pt requires x1 seated rest break during 10-frame game. Pt then performs multiple bouts of ambulation, with PT monitoring HR throughout. Pt instructed to ambulate as quickly and safely as possible. Resting HR prior to activity is 110. Pt ambulates x3 bouts of 100', x1 bout of 150', and x1 of 200', and following each bout pt's HR up to ~120. Pt instructed to rest until HR decreases to 110, then pt resumes ambulation. Following extended seated rest break, pt ambulates 200' back to room. Pt stands to use urinal and PT provides CGA for safety. Sit to supine with all needs within reach.  Therapy Documentation Precautions:  Precautions Precautions: Fall Restrictions Weight Bearing Restrictions: No    Therapy/Group: Individual Therapy  Beau Fanny, PT, DPT 02/15/2021, 12:52 PM

## 2021-02-15 NOTE — Progress Notes (Signed)
Occupational Therapy Session Note  Patient Details  Name: Erik [name] MRN: [id number] Date of Birth: [birth year]  Today's Date: 02/15/2021 OT Individual Time: 0900-1000 OT Individual Time Calculation (min): 60 min   Short Term Goals: Week 2:  OT Short Term Goal 1 (Week 2): Pt will maintain standing at the sink with min A of 1 person for 2 minutes in prep for BADL task OT Short Term Goal 2 (Week 2): Pt will use AE for LB dressing and mod A OT Short Term Goal 3 (Week 2): Pt will complete toilet transfer with mod A of 1  Skilled Therapeutic Interventions/Progress Updates:    Pt greeted semi-reclined in bed stating he had spilled urinal and needed to wash off. Pt completed bed mobility with supervision. Sit<>stand w/ RW and minA , then pt ambulated into bathroom w/ RW and CGA. Bathing completed from bariatric BSC in shower with min A to wash buttocks and lower legs. Pt ambulated out of shower with min A. Dressing completed from wc with pt able to achieve figure 4 with R LE, but needed OT assist to position LLE to thread pants and socks. Pt was able to bend forward and even tie R shoe today, but needed OT assist to tie L. UB there-ex using level 3 green theraband. 3 sets of 10 forward punch and straight arm raise. Pt needed OT assist to achieve full L shoulder ROM with arm raise. Pt left seated in wc at end of session with needs met.   Therapy Documentation Precautions:  Precautions Precautions: Fall Restrictions Weight Bearing Restrictions: No Pain: Pain Assessment Pain Scale: 0-10 Pain Score: 0-No pain   Therapy/Group: Individual Therapy  Valma Cava 02/15/2021, 9:43 AM

## 2021-02-15 NOTE — Progress Notes (Signed)
PROGRESS NOTE   Subjective/Complaints: No complaints this morning. Denies pain, constipation, insomnia.  No concerns as per therapy Hypertensive  ROS: Patient denies CP, SOB, N/V/D   Objective:   No results found. No results for input(s): WBC, HGB, HCT, PLT in the last 72 hours. No results for input(s): NA, K, CL, CO2, GLUCOSE, BUN, CREATININE, CALCIUM in the last 72 hours.  Intake/Output Summary (Last 24 hours) at 02/15/2021 1211 Last data filed at 02/15/2021 0745 Gross per 24 hour  Intake 700 ml  Output 1752 ml  Net -1052 ml        Physical Exam: Vital Signs Blood pressure (!) 164/101, pulse 97, temperature 97.7 F (36.5 C), temperature source Oral, resp. rate 20, height 5\' 10"  (1.778 m), weight (!) 178.3 kg, SpO2 99 %. Gen: no distress, normal appearing HEENT: oral mucosa pink and moist, NCAT Cardio: Reg rate Chest: normal effort, normal rate of breathing Abd: soft, non-distended Ext: no edema Psych: pleasant, normal affect Skin: intact Musculoskeletal:     Cervical back: Normal range of motion and neck supple.     Comments: No edema or tenderness in extremities  Skin:    General: Skin is warm and dry. MASD sacrum/buttocks--stable in appearance Neurological:     Alert and oriented x 3. Normal insight and awareness. Intact Memory. Normal language and speech. Cranial nerve exam unremarkable  Motor: 4+/5 RUE, LUE 3- deltoid, 4+/5 tricep, bicep and 5/5 hand. BLE 4 to 4+/5 prox to 4+/5 distally. Sensory exam normal.  Unchanged           Assessment/Plan: 1. Functional deficits which require 3+ hours per day of interdisciplinary therapy in a comprehensive inpatient rehab setting.  Physiatrist is providing close team supervision and 24 hour management of active medical problems listed below.  Physiatrist and rehab team continue to assess barriers to discharge/monitor patient progress toward functional and medical  goals  Care Tool:  Bathing  Bathing activity did not occur: Refused Body parts bathed by patient: Chest,Abdomen,Front perineal area,Face,Left arm   Body parts bathed by helper: Right arm,Buttocks,Right upper leg,Left upper leg,Right lower leg,Left lower leg     Bathing assist Assist Level: Total Assistance - Patient < 25%     Upper Body Dressing/Undressing Upper body dressing   What is the patient wearing?: Pull over shirt    Upper body assist Assist Level: Set up assist    Lower Body Dressing/Undressing Lower body dressing      What is the patient wearing?: Pants,Underwear/pull up     Lower body assist Assist for lower body dressing: Contact Guard/Touching assist     Toileting Toileting    Toileting assist Assist for toileting: Contact Guard/Touching assist     Transfers Chair/bed transfer  Transfers assist     Chair/bed transfer assist level: Contact Guard/Touching assist     Locomotion Ambulation   Ambulation assist      Assist level: Minimal Assistance - Patient > 75% Assistive device: Walker-rolling Max distance: 180'   Walk 10 feet activity   Assist  Walk 10 feet activity did not occur: Safety/medical concerns  Assist level: Minimal Assistance - Patient > 75% Assistive device: Walker-rolling   Walk  50 feet activity   Assist Walk 50 feet with 2 turns activity did not occur: Safety/medical concerns  Assist level: Minimal Assistance - Patient > 75% Assistive device: Walker-rolling    Walk 150 feet activity   Assist Walk 150 feet activity did not occur: Safety/medical concerns  Assist level: Minimal Assistance - Patient > 75% Assistive device: Walker-rolling    Walk 10 feet on uneven surface  activity   Assist Walk 10 feet on uneven surfaces activity did not occur: Safety/medical concerns         Wheelchair     Assist Will patient use wheelchair at discharge?: No             Wheelchair 50 feet with 2 turns  activity    Assist            Wheelchair 150 feet activity     Assist          Blood pressure (!) 164/101, pulse 97, temperature 97.7 F (36.5 C), temperature source Oral, resp. rate 20, height 5\' 10"  (1.778 m), weight (!) 178.3 kg, SpO2 99 %.    Medical Problem List and Plan: 1.  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   2.  Antithrombotics: -DVT/anticoagulation: Continue Lovenox 82.5 mg daily             -antiplatelet therapy: Aspirin 81 mg daily and Plavix 85 mg daily 3. Pain Management:d/c oxycodone- not requiring.   3/4 con't regimen- has Kpad as well               4. Mood: Provide emotional support             -antipsychotic agents: N/A 5. Neuropsych: This patient is capable of making decisions on his own behalf. 6. Skin/Wound Care: Routine skin checks  -local care to backside  - specialty mattress in place 7. Fluids/Electrolytes/Nutrition: Routine in and outs 8.  AKI.  Renal function improved.  CRRT discontinued.  3/4 BUN/Cr continues general trend down--> 20/1.65  3/7: encourage hydration and repeat creatinine tomorrow.   9.  COVID-19.  Patient completed course of remdesivir as well as steroids.  Contact precautions discontinued 10.  Super obesity.  BMI 53.52 dietary follow-up 11.  Newly diagnosed uncontrolled diabetes mellitus.  Hemoglobin A1c 13.6.  Continue NovoLog 8 units 3 times daily, Levemir 37 units daily  3/7: CBGs 101-149. Cr elevated on 3/4 to 1.65-cannot start metformin.  12.  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 tach.  Cardiology follow-up.  Cardiac catheterization 01/20/2021 no significant CAD.               3/4 BP/HR remain  elevated at rest. Experiencing light-headedness when up at times, however. I witnessed lower BP during an episode yesterday with therapy   -increased metoprolol to 100mg  bid 3/3   -continue to work on acclimation, pacing, sit EOB before OOB   -will try THT (if they can be appropriately placed on him)   -record orthostatic VS daily   -if sx persists and he's truly orthostatic may need to consult cardiology  for recs 13.  Hyperlipidemia: Lipitor  LOS: 12 days A FACE TO FACE EVALUATION WAS PERFORMED  Clint Bolder P Samyah Bilbo 02/15/2021, 12:11 PM

## 2021-02-15 NOTE — Progress Notes (Signed)
Physical Therapy Session Note  Patient Details  Name: Erik [name] MRN: [id number] Date of Birth: 04/16/1991  Today's Date: 02/15/2021 PT Individual Time: 1300-1330 PT Individual Time Calculation (min): 30 min   Short Term Goals: Week 2:  PT Short Term Goal 1 (Week 2): Pt will perform bed mobility consistently with supervision. PT Short Term Goal 2 (Week 2): Pt will perform sit to stand transfers consistently with CGA. PT Short Term Goal 3 (Week 2): Pt will perform bed to chair transfer consistently with CGA. PT Short Term Goal 4 (Week 2): Pt will ambulate 150' with minA +1 and LRAD.  Skilled Therapeutic Interventions/Progress Updates:     Patient in bed upon PT arrival. Patient alert and agreeable to PT session. Patient denied pain during session.  Therapeutic Activity: Bed Mobility: Patient performed supine to/from sit with supervision with use of bed rail. Provided verbal cues for rolling technique for reduced exertion with mobility. Donned B tennis shoes with set-up assist and increased time EOB with supervision for sitting balance. Transfers: Patient performed sit to/from stand x4 with supervision-CGA with RW or B hand rails. Provided verbal cues for forward weight shift.  Gait Training:  Patient ambulated 275 feet using RW with CGA-close supervision with w/c follow for safety due to decreased activity tolerance. Patient required 1 standing rest break on first trial and 2 standing rest breaks on second trial, breaks <10 sec. Ambulated with decreased gait speed, decreased step length and height, forward trunk lean, and intermittent downward head gaze. Provided verbal cues for erect posture, paced breathing.  Patient performed step-taps R/L x4 at 6" steps with B rails for pre-stair training. He then ascended/descended 1-6" step with R then L with min A on R and min-mod A on L with decreased hip/knee extension when stepping up. He then ascended/descended 2-6" steps using B rails with min  A-CGA. Performed step-to gait pattern leading with R while ascending forwards and L while descending backwards. Provided cues for technique and sequencing.   Provided seated rest breaks between gait and stair trials. HR 120-128 bpm after each trial, recovered to 100-105 bpm in <1 min with cues for diaphragmatic breathing.  Patient in bed, doffed shorts and socks with supervision, at end of session with breaks locked, bed alarm set, and all needs within reach.    Therapy Documentation Precautions:  Precautions Precautions: Fall Restrictions Weight Bearing Restrictions: No   Therapy/Group: Individual Therapy  Lenzie Sandler L La Shehan PT, DPT  02/15/2021, 3:21 PM

## 2021-02-15 NOTE — Plan of Care (Signed)
  Problem: RH BOWEL ELIMINATION Goal: RH STG MANAGE BOWEL WITH ASSISTANCE Description: STG Manage Bowel with Mod Assistance. Outcome: Progressing   Problem: RH BLADDER ELIMINATION Goal: RH STG MANAGE BLADDER WITH ASSISTANCE Description: STG Manage Bladder With Mod Assistance Outcome: Progressing   Problem: RH SKIN INTEGRITY Goal: RH STG SKIN FREE OF INFECTION/BREAKDOWN Description: Skin will remain free of infection or breakdown with mod assist. Outcome: Progressing   Problem: RH PAIN MANAGEMENT Goal: RH STG PAIN MANAGED AT OR BELOW PT'S PAIN GOAL Description: Patient will remain with pain less than 2 during stay.  Outcome: Progressing   Problem: Consults Goal: RH GENERAL PATIENT EDUCATION Description: See Patient Education module for education specifics. Outcome: Progressing   

## 2021-02-16 LAB — BASIC METABOLIC PANEL
Anion gap: 11 (ref 5–15)
BUN: 17 mg/dL (ref 6–20)
CO2: 20 mmol/L — ABNORMAL LOW (ref 22–32)
Calcium: 9.2 mg/dL (ref 8.9–10.3)
Chloride: 108 mmol/L (ref 98–111)
Creatinine, Ser: 1.65 mg/dL — ABNORMAL HIGH (ref 0.61–1.24)
GFR, Estimated: 57 mL/min — ABNORMAL LOW (ref >60)
Glucose, Bld: 99 mg/dL (ref 70–99)
Potassium: 3.7 mmol/L (ref 3.5–5.1)
Sodium: 139 mmol/L (ref 135–145)

## 2021-02-16 LAB — GLUCOSE, CAPILLARY
Glucose-Capillary: 87 mg/dL (ref 70–99)
Glucose-Capillary: 104 mg/dL — ABNORMAL HIGH (ref 70–99)
Glucose-Capillary: 111 mg/dL — ABNORMAL HIGH (ref 70–99)
Glucose-Capillary: 101 mg/dL — ABNORMAL HIGH (ref 70–99)

## 2021-02-16 MED ORDER — HYDRALAZINE HCL 10 MG PO TABS
10.0000 mg | ORAL_TABLET | Freq: Three times a day (TID) | ORAL | Status: DC
  Administered 2021-02-16 – 2021-02-18 (×7): 10 mg via ORAL
  Filled 2021-02-16 (×7): qty 1

## 2021-02-16 NOTE — Progress Notes (Signed)
Physical Therapy Session Note  Patient Details  Name: Erik [name] MRN: [id number] Date of Birth: 1991/08/17  Today's Date: 02/16/2021 PT Individual Time: 1002-1029 and 1105-1200 PT Individual Time Calculation (min): 27 min and 55 min  Short Term Goals: Week 2:  PT Short Term Goal 1 (Week 2): Pt will perform bed mobility consistently with supervision. PT Short Term Goal 2 (Week 2): Pt will perform sit to stand transfers consistently with CGA. PT Short Term Goal 3 (Week 2): Pt will perform bed to chair transfer consistently with CGA. PT Short Term Goal 4 (Week 2): Pt will ambulate 150' with minA +1 and LRAD.  Skilled Therapeutic Interventions/Progress Updates:     1st Session: Pt received seated in [facility] and agrees to therapy. WC transport to gym for time management. Pt performs gait training in parallel bars, with emphasis on balance and decreasing upper extremity support. Pt performs multiple reps of sit to stand with supervision. Pt starts ambulating forward and backward 5' each, with bilateral upper extremity support, progressing to R upper extremity support, then alternating to L upper extremity support. Following seated rest break, pt performs multiple reps of forward gait x5' with no upper extremity support and CGA from PT. Pt performs x2 reps of 5' backward ambulation with no support and minA from PT. Pt left seated in WC with alarm intact and all needs within reach.  2nd Session: pt received seated in [facility] and agrees to therapy. No complaint of pain. WC transport to gym for time management. Pt performs gait training in litegait over treadmill for body weight supported ambulation. Pt completes following bouts:  1:30 at 0.7 mph for 123' 1:23 at 1.0 mph for 96' 3:29 at 0.7 mph for 212' Pt takes multiples seated rest breaks during gait training and verbalizes some light headedness and blurry vision. BP taken 109/74. During gait PT cues for increased step length, increased heel strike at initial  contact, and practicing ambulation without single upper extremity support. Pt then performs standing heel raises and toe raises x10 each with body weight support. WC transport back to room. Pt left seated in WC with all needs within reach.           Therapy Documentation Precautions:  Precautions Precautions: Fall Restrictions Weight Bearing Restrictions: No    Therapy/Group: Individual Therapy  Beau Fanny, PT, DPT 02/16/2021, 11:45 AM

## 2021-02-16 NOTE — Progress Notes (Signed)
Occupational Therapy Session Note  Patient Details  Name: Erik [name] MRN: [id number] Date of Birth: [birth year]  Today's Date: 02/16/2021 Session1 OT Individual Time: 6244-6950 OT Individual Time Calculation (min): 60 min   Session 2 OT Individual Time: 1450-1533 OT Individual Time Calculation (min): 43 min   Short Term Goals: Week 2:  OT Short Term Goal 1 (Week 2): Pt will maintain standing at the sink with min A of 1 person for 2 minutes in prep for BADL task OT Short Term Goal 2 (Week 2): Pt will use AE for LB dressing and mod A OT Short Term Goal 3 (Week 2): Pt will complete toilet transfer with mod A of 1  Skilled Therapeutic Interventions/Progress Updates:  Session 1   Pt greeted semi-reclined in bed and agreeable to OT treatment session. Pt reported need to go to the bathroom and shower. Pt completed bed mobility with bed functions and supervision. Sit<>stand w. RW and CGA to ambualte into bathroom. Pt able to manage clothing and had successful BM. Pt also able to use urinal to void 2/2 body habitus. Pt able to perform peri-care in standing. OT issued LH sponge and pt able to complete bathing tasks with CGA. Pt completed LB dressing using figure 4 position today with only OT assist to tie shoes. Pt needed to sit EOB in order to get socks on. Worked on standing balance/endurance with standing toothbrushing task with supervision. 10 sit<.stands at the sink without AD and intermittent min A to balance on sink counter. Pt left seated in wc at end of session with needs met.   Session 2 Pt greeted seated in wc and agreeable to OT treatment session. Pt ambulated to the therapy gym with rollator and CGA. UB there-ex with 4 sets of 10 bicep curls, chest press, straight arm raises, using 2 lb dowel rod, then triceps pull down and seated row. Pt ambulated back to room with 1 standing rest break. Pt returned to bed with and left semi-reclined in be with call bell in reach, and needs met.     Therapy Documentation Precautions:  Precautions Precautions: Fall Restrictions Weight Bearing Restrictions: No Pain:   denies pain  Therapy/Group: Individual Therapy  Valma Cava 02/16/2021, 3:43 PM

## 2021-02-16 NOTE — Progress Notes (Signed)
PROGRESS NOTE   Subjective/Complaints: Cr stable No complaints this morning. Denies pain, constipation, insomnia.  Making tremendous improvements!  ROS: Patient denies CP, SOB, N/V/D   Objective:   No results found. No results for input(s): WBC, HGB, HCT, PLT in the last 72 hours. Recent Labs    02/16/21 0538  NA 139  K 3.7  CL 108  CO2 20*  GLUCOSE 99  BUN 17  CREATININE 1.65*  CALCIUM 9.2    Intake/Output Summary (Last 24 hours) at 02/16/2021 0826 Last data filed at 02/16/2021 0700 Gross per 24 hour  Intake 780 ml  Output --  Net 780 ml        Physical Exam: Vital Signs Blood pressure (!) 168/106, pulse 92, temperature 97.9 F (36.6 C), resp. rate 20, height 5\' 10"  (1.778 m), weight (!) 180.1 kg, SpO2 100 %. Gen: no distress, normal appearing HEENT: oral mucosa pink and moist, NCAT Cardio: Reg rate Chest: normal effort, normal rate of breathing Abd: soft, non-distended Ext: no edema Psych: pleasant, normal affect Musculoskeletal:     Cervical back: Normal range of motion and neck supple.     Comments: No edema or tenderness in extremities  Skin:    General: Skin is warm and dry. MASD sacrum/buttocks--stable in appearance Neurological:     Alert and oriented x 3. Normal insight and awareness. Intact Memory. Normal language and speech. Cranial nerve exam unremarkable  Motor: 4+/5 RUE, LUE 3- deltoid, 4+/5 tricep, bicep and 5/5 hand. BLE 4 to 4+/5 prox to 4+/5 distally. Sensory exam normal.  Unchanged           Assessment/Plan: 1. Functional deficits which require 3+ hours per day of interdisciplinary therapy in a comprehensive inpatient rehab setting.  Physiatrist is providing close team supervision and 24 hour management of active medical problems listed below.  Physiatrist and rehab team continue to assess barriers to discharge/monitor patient progress toward functional and medical goals  Care  Tool:  Bathing  Bathing activity did not occur: Refused Body parts bathed by patient: Chest,Abdomen,Front perineal area,Face,Left arm   Body parts bathed by helper: Right arm,Buttocks,Right upper leg,Left upper leg,Right lower leg,Left lower leg     Bathing assist Assist Level: Total Assistance - Patient < 25%     Upper Body Dressing/Undressing Upper body dressing   What is the patient wearing?: Pull over shirt    Upper body assist Assist Level: Set up assist    Lower Body Dressing/Undressing Lower body dressing      What is the patient wearing?: Pants,Underwear/pull up     Lower body assist Assist for lower body dressing: Contact Guard/Touching assist     Toileting Toileting    Toileting assist Assist for toileting: Contact Guard/Touching assist     Transfers Chair/bed transfer  Transfers assist     Chair/bed transfer assist level: Contact Guard/Touching assist Chair/bed transfer assistive device:   Ambulation assist      Assist level: Contact Guard/Touching assist Assistive device: Walker-rolling Max distance: 275 ft   Walk 10 feet activity   Assist  Walk 10 feet activity did not occur: Safety/medical concerns  Assist level: Contact Guard/Touching  assist Assistive device: Walker-rolling   Walk 50 feet activity   Assist Walk 50 feet with 2 turns activity did not occur: Safety/medical concerns  Assist level: Contact Guard/Touching assist Assistive device: Walker-rolling    Walk 150 feet activity   Assist Walk 150 feet activity did not occur: Safety/medical concerns  Assist level: Contact Guard/Touching assist Assistive device: Walker-rolling    Walk 10 feet on uneven surface  activity   Assist Walk 10 feet on uneven surfaces activity did not occur: Safety/medical concerns         Wheelchair     Assist Will patient use wheelchair at discharge?: No             Wheelchair 50 feet with  2 turns activity    Assist            Wheelchair 150 feet activity     Assist          Blood pressure (!) 168/106, pulse 92, temperature 97.9 F (36.6 C), resp. rate 20, height 5\' 10"  (1.778 m), weight (!) 180.1 kg, SpO2 100 %.    Medical Problem List and Plan: 1.  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   2.  Antithrombotics: -DVT/anticoagulation: Continue Lovenox 82.5 mg daily             -antiplatelet therapy: Aspirin 81 mg daily and Plavix 85 mg daily 3. Pain Management:d/c oxycodone- not requiring.   3/4 con't regimen- has Kpad as well               4. Mood: Provide emotional support             -antipsychotic agents: N/A 5. Neuropsych: This patient is capable of making decisions on his own behalf. 6. Skin/Wound Care: Routine skin checks  -local care to backside  - specialty mattress in place 7. Fluids/Electrolytes/Nutrition: Routine in and outs 8.  AKI.  Renal function improved.  CRRT discontinued.  3/4 BUN/Cr continues general trend down--> 20/1.65  3/7: encourage hydration and repeat creatinine tomorrow.    3/9: Cr reviewed and is stable, continue to encourage hydration 9.  COVID-19.  Patient completed course of remdesivir as well as steroids.  Contact precautions discontinued 10.  Super obesity.  BMI 53.52 dietary follow-up 11.  Newly diagnosed uncontrolled diabetes mellitus.  Hemoglobin A1c 13.6.  Continue NovoLog 8 units 3 times daily, Levemir 37 units daily  3/9: mildly elevated. Cr elevated on 3/4 to 1.65-cannot start metformin. Provide dietary education 12.  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 tach.  Cardiology follow-up.   Cardiac catheterization 01/20/2021 no significant CAD.               3/4 BP/HR remain elevated at rest. Experiencing light-headedness when up at times, however. I witnessed lower BP during an episode yesterday with therapy   -increased metoprolol to 100mg  bid 3/3   -continue to work on acclimation, pacing, sit EOB before OOB   -will try THT (if they can be appropriately placed on him)   -record orthostatic VS daily   -if  sx persists and he's truly orthostatic may need to consult cardiology for recs 13.  Hyperlipidemia: Lipitor 14. Uncontrolled hypertension to 168/106: Hydralazine 10mg  TID started  LOS: 13 days A FACE TO FACE EVALUATION WAS PERFORMED  P Erik [name] 02/16/2021, 8:26 AM

## 2021-02-16 NOTE — Patient Care Conference (Signed)
Inpatient RehabilitationTeam Conference and Plan of Care Update Date: 02/16/2021   Time: 10:30 AM    Patient Name: Erik [name]      Medical Record Number: [id number]  Date of Birth: 06-19-1991 Sex: Male         Room/Bed: 4W01C/4W01C-01 Payor Info: Payor: /    Admit Date/Time:  02/03/2021 12:52 PM  Primary Diagnosis:  Debility  Hospital Problems: Principal Problem:   Debility    Expected Discharge Date: Expected Discharge Date: 02/26/21  Team Members Present: Physician leading conference: Dr. Sula Soda Care Coodinator Present: Cecile Sheerer, LCSWA;Stacey Marlyne Beards, RN, BSN, CRRN Nurse Present: Otilio Carpen, RN PT Present: Malachi Pro, PT OT Present: Kearney Hard, OT PPS Coordinator present : Fae Pippin, SLP     Current Status/Progress Goal Weekly Team Focus  Bowel/Bladder   continent og b/b; LBM: 02/06  maintain regular bowel program  assist with toileting needs prn   Swallow/Nutrition/ Hydration             ADL's   CGA/min A overall  Supervision/CGA  general strengthening, activity tolerance, sit<>stands,   Mobility   supervision bed mobility and transfers, gait with minA and RW x280'  supervision  endurance, ambulation, balance, stairs   Communication             Safety/Cognition/ Behavioral Observations            Pain   no c/o pain  remain pain free  assess pain level QS and prn   Skin   MASD to buttocks; skin tear to mid back  continue special matress, remain free of new skin breakdown/infection  assess skin QS and prn     Discharge Planning:  Pt is uninsured. D/c to home with 24/7 care. Mother is a dialysis pt- TTS 1st shift. Pt mother reports pt will have assistance from various family members that can stay with pt if needed. fam edu 3/14 9am-11am with his mother.   Team Discussion: No complaints noted, AKI stable, encourage fluids, BP increase added Hydralazine. Continent B/B, MASD to buttocks. Uninsured, family education scheduled with mom  next week. Patient on target to meet rehab goals: Contact guard assist for ADL's with AD equipment. Ambulated 280 ft with RW with min guard assist. Has supervision goals.  *See Care Plan and progress notes for long and short-term goals.   Revisions to Treatment Plan:  MD added Hydralazine for BP.  Teaching Needs: Family education, medication management, skin/wound care, transfer training, gait training, balance training, endurance training, pain management, stair training.  Current Barriers to Discharge: Inaccessible home environment, Decreased caregiver support, Home enviroment access/layout, Wound care, Lack of/limited family support, Weight, Medication compliance, Behavior and lack of insurance.  Possible Resolutions to Barriers: Continue current medications, provide emotional support.     Medical Summary Current Status: MASD to buttock, no complaints, Na decreased to 134, hgb trending upward, vitals stable  Barriers to Discharge: Medical stability;Wound care  Barriers to Discharge Comments: MASD to buttock, AKI, Na decreased to 134, hgb trending upward Possible Resolutions to [employer]: daily monitoring of buttocks, weekly monitoring of Cr, NA, Hgb   Continued Need for Acute Rehabilitation Level of Care: The patient requires daily medical management by a physician with specialized training in physical medicine and rehabilitation for the following reasons: Direction of a multidisciplinary physical rehabilitation program to maximize functional independence : Yes Medical management of patient stability for increased activity during participation in an intensive rehabilitation regime.: Yes Analysis of laboratory values and/or radiology  reports with any subsequent need for medication adjustment and/or medical intervention. : Yes   I attest that I was present, lead the team conference, and concur with the assessment and plan of the team.   Tennis Must 02/16/2021, 2:57  PM

## 2021-02-16 NOTE — Progress Notes (Signed)
Patient ID: Erik [name], male   DOB: 26-Sep-1991, 30 y.o.   MRN: [id number]  SW met with pt in room, and pt called his mother while SW in room so updates could be provided from team conference. SW informed on gains made while here in rehab, and d/c date remains 3/18. Fam edu remains 3/14 9am-11am with pt mother.   Loralee Pacas, MSW, [place] Office: [phone] Cell: [phone] Fax: [phone]

## 2021-02-17 LAB — GLUCOSE, CAPILLARY
Glucose-Capillary: 104 mg/dL — ABNORMAL HIGH (ref 70–99)
Glucose-Capillary: 112 mg/dL — ABNORMAL HIGH (ref 70–99)
Glucose-Capillary: 89 mg/dL (ref 70–99)
Glucose-Capillary: 99 mg/dL (ref 70–99)

## 2021-02-17 MED ORDER — ENSURE MAX PROTEIN PO LIQD
11.0000 [oz_av] | Freq: Every day | ORAL | Status: DC
  Administered 2021-02-17 – 2021-02-25 (×8): 11 [oz_av] via ORAL

## 2021-02-17 NOTE — Progress Notes (Signed)
Occupational Therapy Session Note  Patient Details  Name: Erik [name] MRN: [id number] Date of Birth: 01/17/1991  Today's Date: 02/17/2021 OT Individual Time: 1345-1428 OT Individual Time Calculation (min): 43 min    Short Term Goals: Week 1:  OT Short Term Goal 1 (Week 1): Patient will complete sit<>stand with max A of 1 person OT Short Term Goal 1 - Progress (Week 1): Met OT Short Term Goal 2 (Week 1): Patient will maintain standing in [place] for 1 minute in prep for BADL task OT Short Term Goal 2 - Progress (Week 1): Met OT Short Term Goal 3 (Week 1): Pt will complete 1 step up LB dressing task OT Short Term Goal 3 - Progress (Week 1): Met  Skilled Therapeutic Interventions/Progress Updates:    1:1. Pt receive in w/c agreeable ot OT. Pt with no pain and ready to go for tx. Pt propels w/c part way to tx gym for BUE strengthening and S. Pt stands at bits for stnading endurance and reaching competition task with OT selecting #s/Letters with LUE. Pt traces shapes with LUE and MIN facilitation for hig ranges. Pt stands at Wii bowling for 8 frames with no rest break and CGA fading to S. Pt able to self initiate short rest break prior to last 2 frames for energy conservation. Exited session with pt seated in w/c, exit alarm on and call light in reach   Therapy Documentation Precautions:  Precautions Precautions: Fall Restrictions Weight Bearing Restrictions: No General:   Vital Signs:  Pain:   ADL: ADL Eating: Set up Grooming: Setup Upper Body Bathing: Moderate assistance Lower Body Bathing: Dependent Upper Body Dressing: Maximal assistance Lower Body Dressing: Dependent Toileting: Dependent Vision   Perception    Praxis   Exercises:   Other Treatments:     Therapy/Group: Individual Therapy  Stephanie M Schlosser 02/17/2021, 12:31 PM 

## 2021-02-17 NOTE — Progress Notes (Signed)
Patient alert,cooperative, able to verbalize his needs to staff,denies pain or discomfort, Monitor uneventful, continue regime

## 2021-02-17 NOTE — Plan of Care (Signed)
  Problem: RH BOWEL ELIMINATION Goal: RH STG MANAGE BOWEL WITH ASSISTANCE Description: STG Manage Bowel with Mod Assistance. Outcome: Progressing   Problem: RH BLADDER ELIMINATION Goal: RH STG MANAGE BLADDER WITH ASSISTANCE Description: STG Manage Bladder With Mod Assistance Outcome: Progressing   Problem: RH SKIN INTEGRITY Goal: RH STG SKIN FREE OF INFECTION/BREAKDOWN Description: Skin will remain free of infection or breakdown with mod assist. Outcome: Progressing   Problem: RH PAIN MANAGEMENT Goal: RH STG PAIN MANAGED AT OR BELOW PT'S PAIN GOAL Description: Patient will remain with pain less than 2 during stay.  Outcome: Progressing   Problem: Consults Goal: RH GENERAL PATIENT EDUCATION Description: See Patient Education module for education specifics. Outcome: Progressing

## 2021-02-17 NOTE — Progress Notes (Signed)
Nutrition Follow-up  DOCUMENTATION CODES:   Morbid obesity  INTERVENTION:   - Ensure Max po daily, each supplement provides 150 kcal and 30 grams of protein (chocolate flavor)  - Continue double protein portions TID with meals  - Continue MVI with minerals daily  NUTRITION DIAGNOSIS:   Increased nutrient needs related to catabolic illness (recent COVID-19 infection) as evidenced by estimated needs.  Ongoing, being addressed via supplements  GOAL:   Patient will meet greater than or equal to 90% of their needs  Progressing  MONITOR:   PO intake,Supplement acceptance,Labs,Weight trends,I & O's  REASON FOR ASSESSMENT:   Malnutrition Screening Tool    ASSESSMENT:   30 year old male with unremarkable PMH. Presented on 01/12/21 with progressive weakness, poor PO intake, lethargy x 2 weeks. Pt admitted with COVID-19. Pt did initially require intubation for airway protection. Renal service was consulted and CRRT initiated for AKI which has now resolved. Pt admitted to CIR on 2/23.  Weight trending up since admission. Pt with non-pitting edema to BUE and BLE.  Spoke with pt at bedside. He reports excellent appetite. He states that he is eating well. Pt reports that the Ensure Max supplements are "okay." He has been consuming 1-2 daily. Given excellent PO intake, will change Ensure Max order to daily. Pt prefers chocolate flavor.  Meal Completion: 100%  Medications reviewed and include: pepcid, SSI, novolog 8 units TID with meals, levemir 27 units daily, MVI with minerals daily, Ensure Max BID  Labs reviewed: creatinine 1.65, hemoglobin 9.4 CBG's: 87-111 x 24 hours  NUTRITION - FOCUSED PHYSICAL EXAM:  Flowsheet Row Most Recent Value  Orbital Region No depletion  Upper Arm Region No depletion  Thoracic and Lumbar Region No depletion  Buccal Region No depletion  Temple Region No depletion  Clavicle Bone Region No depletion  Clavicle and Acromion Bone Region No depletion   Scapular Bone Region No depletion  Dorsal Hand No depletion  Patellar Region No depletion  Anterior Thigh Region No depletion  Posterior Calf Region No depletion  Edema (RD Assessment) Moderate  Hair Reviewed  Eyes Reviewed  Mouth Reviewed  Skin Reviewed  Nails Reviewed       Diet Order:   Diet Order            Diet Carb Modified Fluid consistency: Thin; Room service appropriate? Yes  Diet effective now                 EDUCATION NEEDS:   No education needs have been identified at this time  Skin:  Skin Assessment: Skin Integrity Issues: Other: skin tear to vertebral column, MASD to buttocks  Last BM:  02/16/21 medium type 4  Height:   Ht Readings from Last 1 Encounters:  02/03/21 5\' 10"  (1.778 m)    Weight:   Wt Readings from Last 1 Encounters:  02/17/21 (!) 180.5 kg    BMI:  Body mass index is 57.11 kg/m.  Estimated Nutritional Needs:   Kcal:  2700-2900  Protein:  150-170 grams  Fluid:  >/= 2.5 L    04/19/21, MS, RD, LDN Inpatient Clinical Dietitian Please see [facility] for contact information.

## 2021-02-17 NOTE — Progress Notes (Addendum)
Physical Therapy Session Note  Patient Details  Name: Erik [name] MRN: [id number] Date of Birth: Sep 03, 1991  Today's Date: 02/17/2021 PT Individual Time: 0800-0900 and 1430-1505 PT Individual Time Calculation (min): 60 min and 35 min  Short Term Goals: Week 1:  PT Short Term Goal 1 (Week 1): Pt will perform bed mobility at minA PT Short Term Goal 1 - Progress (Week 1): Met PT Short Term Goal 2 (Week 1): Pt will perform bed to chair transfers with modA PT Short Term Goal 2 - Progress (Week 1): Met PT Short Term Goal 3 (Week 1): Pt will perform sit to stand transfer with minA. PT Short Term Goal 3 - Progress (Week 1): Met PT Short Term Goal 4 (Week 1): Pt will ambulate x25' with modA +2. PT Short Term Goal 4 - Progress (Week 1): Met Week 2:  PT Short Term Goal 1 (Week 2): Pt will perform bed mobility consistently with supervision. PT Short Term Goal 2 (Week 2): Pt will perform sit to stand transfers consistently with CGA. PT Short Term Goal 3 (Week 2): Pt will perform bed to chair transfer consistently with CGA. PT Short Term Goal 4 (Week 2): Pt will ambulate 150' with minA +1 and LRAD. Week 3:     Skilled Therapeutic Interventions/Progress Updates:   AM session  Pain:  Pt reports no  pain.  Treatment to tolerance.  Rest breaks and repositioning as needed.  Pt initially supine and agreeable to treatment session w/focus on gait training and functional strength/endurance. Pt supine to sit on edge of bed w/superivsion and use of rails.  Dons shorts w/supervision to thighs. Shoes donned by therapist for time management. .Sit to stand W/Lt gt UE support for donning of Lt gait harness w/cga.  Pt stood 1 min then c/o dizzyness. Returned to sitting BP 135/92 Discussed OH symptoms and counteracting via LE activity/gait.  Sx resolved. Repeated Sit to stand, completed donning harness and straight to gait to minimize OH sx Pt able to step up onto TM w/min assist.  First trial 721mn 40sec steps  106 ft speed .855m  Second trial: .21m31m1mi23m0sec 114 ft  Third trial 2min55msec 309ft 29fph  3mable to back down from LtGait w/min assist, backs to wc in LiteGt.Lilyess removed and pt transported to room at end of session.  Pt left oob in wc w/alarm belt set and needs in reach  PM session Pain:  Pt reports no pain.  Treatment to tolerance.  Rest breaks and repositioning as needed.  Pt initially supine and agreeable to treatment session w/focus on general strenghtening and endurance. Supine to sit w/bed rail, ho b elevated, supervision.  stand pivot transfer to wc w/cga.   Once in wc, pt transported to gym for continued session.   Repeated Sit to stand from wc x 10 w/cga, no walker. Seated rest for recovery, dyspnea w/exertion stand pivot transfer to mat w/cga and RW Sidestepping length of mat x 4 w/cga to min assist for walker management/safety.  Seated rest for recovery.  Standing rebounder w/1lb ball x 30 sec repeated x 3 w/seated rest due to dyspnea w/exertion.  Pt transported to room at end of session.  Sit to stand and short distance gt to hob w/cga and RW.  Pt lowers shorts in standing, completes romoval in sitting w/supervision.  Removes shoes and socks w/supervision on edge of bed.  Sit to supine w/supervision.   Pt left supine w/rails up x 4 alarm  set, bed in lowest position, and needs in reach.   Therapy Documentation Precautions:  Precautions Precautions: Fall Restrictions Weight Bearing Restrictions: No    Therapy/Group: Individual Therapy  Callie Fielding, [place] 02/17/2021, 3:57 PM

## 2021-02-17 NOTE — Progress Notes (Signed)
Occupational Therapy Session Note  Patient Details  Name: Erik [name] MRN: [id number] Date of Birth: 01-05-1991  Today's Date: 02/17/2021 OT Individual Time: 3299-2426 OT Individual Time Calculation (min): 43 min    Short Term Goals: Week 1:  OT Short Term Goal 1 (Week 1): Patient will complete sit<>stand with max A of 1 person OT Short Term Goal 1 - Progress (Week 1): Met OT Short Term Goal 2 (Week 1): Patient will maintain standing in [place] for 1 minute in prep for BADL task OT Short Term Goal 2 - Progress (Week 1): Met OT Short Term Goal 3 (Week 1): Pt will complete 1 step up LB dressing task OT Short Term Goal 3 - Progress (Week 1): Met  Skilled Therapeutic Interventions/Progress Updates:    Pt received in wc with no pain reported and completes all transfers/mobility with RW and CGA via ambulation. Total A w/c transport to gym for time management. Pt completes supine therex with 2# dowel rod/dumbbell and instructional/demo cuing for BUE strengthening in gravity minimizing positions: shoulder flex/ext, chest press, chest fly, shouler abadduct. Pt completes seated EOB shuttle fly 3x45 seconds at diagonal/horizontal abduct for BUE endurance required for ADLs. Exited session with pt seated in bed, exit alarm on and call light in reach   Therapy Documentation Precautions:  Precautions Precautions: Fall Restrictions Weight Bearing Restrictions: No General:   Vital Signs: Therapy Vitals Temp: 98.2 F (36.8 C) Temp Source: Oral Pulse Rate: 91 Resp: 18 BP: 123/86 Patient Position (if appropriate): Lying Oxygen Therapy SpO2: 97 % O2 Device: Room Air Pain: Pain Assessment Pain Score: 0-No pain ADL: ADL Eating: Set up Grooming: Setup Upper Body Bathing: Moderate assistance Lower Body Bathing: Dependent Upper Body Dressing: Maximal assistance Lower Body Dressing: Dependent Toileting: Dependent Vision   Perception    Praxis   Exercises:   Other Treatments:      Therapy/Group: Individual Therapy  Tonny Branch 02/17/2021, 6:49 AM

## 2021-02-17 NOTE — Progress Notes (Signed)
PROGRESS NOTE   Subjective/Complaints: No complaints this morning  ROS: Patient denies CP, SOB, N/V/D   Objective:   No results found. No results for input(s): WBC, HGB, HCT, PLT in the last 72 hours. Recent Labs    02/16/21 0538  NA 139  K 3.7  CL 108  CO2 20*  GLUCOSE 99  BUN 17  CREATININE 1.65*  CALCIUM 9.2    Intake/Output Summary (Last 24 hours) at 02/17/2021 1603 Last data filed at 02/17/2021 0733 Gross per 24 hour  Intake 356 ml  Output 1300 ml  Net -944 ml        Physical Exam: Vital Signs Blood pressure 123/82, pulse 100, temperature 98 F (36.7 C), resp. rate 17, height 5\' 10"  (1.778 m), weight (!) 180.5 kg, SpO2 100 %. Gen: no distress, normal appearing HEENT: oral mucosa pink and moist, NCAT Cardio: Reg rate Chest: normal effort, normal rate of breathing Abd: soft, non-distended Ext: no edema Psych: pleasant, normal affect Musculoskeletal:     Cervical back: Normal range of motion and neck supple.     Comments: No edema or tenderness in extremities  Skin:    General: Skin is warm and dry. MASD sacrum/buttocks--stable in appearance Neurological:     Alert and oriented x 3. Normal insight and awareness. Intact Memory. Normal language and speech. Cranial nerve exam unremarkable  Motor: 4+/5 RUE, LUE 3- deltoid, 4+/5 tricep, bicep and 5/5 hand. BLE 4 to 4+/5 prox to 4+/5 distally. Sensory exam normal.  Unchanged           Assessment/Plan: 1. Functional deficits which require 3+ hours per day of interdisciplinary therapy in a comprehensive inpatient rehab setting.  Physiatrist is providing close team supervision and 24 hour management of active medical problems listed below.  Physiatrist and rehab team continue to assess barriers to discharge/monitor patient progress toward functional and medical goals  Care Tool:  Bathing  Bathing activity did not occur: Refused Body parts bathed by  patient: Chest,Abdomen,Front perineal area,Face,Left arm   Body parts bathed by helper: Right arm,Buttocks,Right upper leg,Left upper leg,Right lower leg,Left lower leg     Bathing assist Assist Level: Total Assistance - Patient < 25%     Upper Body Dressing/Undressing Upper body dressing   What is the patient wearing?: Pull over shirt    Upper body assist Assist Level: Set up assist    Lower Body Dressing/Undressing Lower body dressing      What is the patient wearing?: Pants,Underwear/pull up     Lower body assist Assist for lower body dressing: Contact Guard/Touching assist     Toileting Toileting    Toileting assist Assist for toileting: Contact Guard/Touching assist     Transfers Chair/bed transfer  Transfers assist     Chair/bed transfer assist level: Contact Guard/Touching assist Chair/bed transfer assistive device: Walker   Locomotion Ambulation   Ambulation assist      Assist level: Total Assistance - Patient < 25% Assistive device: Lite Gait Max distance: 212'   Walk 10 feet activity   Assist  Walk 10 feet activity did not occur: Safety/medical concerns  Assist level: Total Assistance - Patient < 25% Assistive device: Lite  Gait   Walk 50 feet activity   Assist Walk 50 feet with 2 turns activity did not occur: Safety/medical concerns  Assist level: Contact Guard/Touching assist Assistive device: Walker-rolling    Walk 150 feet activity   Assist Walk 150 feet activity did not occur: Safety/medical concerns  Assist level: Total Assistance - Patient < 25% Assistive device: Lite Gait    Walk 10 feet on uneven surface  activity   Assist Walk 10 feet on uneven surfaces activity did not occur: Safety/medical concerns         Wheelchair     Assist Will patient use wheelchair at discharge?: No             Wheelchair 50 feet with 2 turns activity    Assist            Wheelchair 150 feet activity      Assist          Blood pressure 123/82, pulse 100, temperature 98 F (36.7 C), resp. rate 17, height 5\' 10"  (1.778 m), weight (!) 180.5 kg, SpO2 100 %.    Medical Problem List and Plan: 1.  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   2.  Antithrombotics: -DVT/anticoagulation: Continue Lovenox 82.5 mg daily             -antiplatelet therapy: Aspirin 81 mg daily and Plavix 85 mg daily 3. Pain Management:d/c oxycodone- not requiring.   3/4 con't regimen- has Kpad as well               4. Mood: Provide emotional support             -antipsychotic agents: N/A 5. Neuropsych: This patient is capable of making decisions on his own behalf. 6. Skin/Wound Care: Routine skin checks  -local care to backside  - specialty mattress in place 7. Fluids/Electrolytes/Nutrition: Routine in and outs 8.  AKI.  Renal function improved.  CRRT discontinued.  3/4 BUN/Cr continues general trend down--> 20/1.65  3/7: encourage hydration and repeat creatinine tomorrow.    3/9: Cr reviewed and is stable, continue to encourage hydration 9.  COVID-19.  Patient completed course of remdesivir as well as steroids.  Contact precautions discontinued 10.  Super obesity.  BMI 53.52 dietary follow-up 11.  Newly diagnosed uncontrolled diabetes mellitus.  Hemoglobin A1c 13.6.  Continue NovoLog 8 units 3 times daily, Levemir 37 units daily  3/9: mildly elevated. Cr elevated on 3/4 to 1.65-cannot start metformin. Provide dietary education. Continue to monitor AC/HS 12.  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 tach.  Cardiology follow-up.  Cardiac catheterization 01/20/2021 no significant CAD.                3/4 BP/HR remain elevated at rest. Experiencing light-headedness when up at times, however. I witnessed lower BP during an episode yesterday with therapy   -increased metoprolol to 100mg  bid 3/3   -continue to work on acclimation, pacing, sit EOB before OOB   -will try THT (if they can be appropriately placed on him)   -record orthostatic VS  daily   -if sx persists and he's truly orthostatic may need to consult cardiology for recs 13.  Hyperlipidemia: Lipitor 14. Uncontrolled hypertension to 168/106: Hydralazine 10mg  TID started  3/9: much better controlled- continue to monitor  LOS: 14 days A FACE TO FACE EVALUATION WAS PERFORMED  5/9 P Erik [name] 02/17/2021, 4:03 PM

## 2021-02-18 LAB — GLUCOSE, CAPILLARY
Glucose-Capillary: 91 mg/dL (ref 70–99)
Glucose-Capillary: 104 mg/dL — ABNORMAL HIGH (ref 70–99)
Glucose-Capillary: 100 mg/dL — ABNORMAL HIGH (ref 70–99)
Glucose-Capillary: 82 mg/dL (ref 70–99)

## 2021-02-18 MED ORDER — HYDRALAZINE HCL 25 MG PO TABS
25.0000 mg | ORAL_TABLET | Freq: Three times a day (TID) | ORAL | Status: DC
  Administered 2021-02-18 – 2021-02-19 (×3): 25 mg via ORAL
  Filled 2021-02-18 (×3): qty 1

## 2021-02-18 NOTE — Progress Notes (Signed)
Physical Therapy Session Note  Patient Details  Name: Erik [name] MRN: [id number] Date of Birth: 1991/09/02  Today's Date: 02/18/2021 PT Individual Time: 6226-3335 PT Individual Time Calculation (min): 47 min   Short Term Goals: Week 2:  PT Short Term Goal 1 (Week 2): Pt will perform bed mobility consistently with supervision. PT Short Term Goal 2 (Week 2): Pt will perform sit to stand transfers consistently with CGA. PT Short Term Goal 3 (Week 2): Pt will perform bed to chair transfer consistently with CGA. PT Short Term Goal 4 (Week 2): Pt will ambulate 150' with minA +1 and LRAD.  Skilled Therapeutic Interventions/Progress Updates:     Pt received supine in bed and agrees to therapy. No complaint of pain. Supine to sit mod(I) with bed features. Sit to stand and stand pivot transfer to [facility] with supervision and cues for initiation and positioning. WC transport to gym for time management. Pt performs NMR for activity tolerance and standing balance, as well as upper extremity strength and coordination, performing "boxing" activity with punching bag. Pt performs sit to stand with close supervision. Pt punches with alternating upper extremities for 30 seconds to 1 minute at a time, with CGA for safety. PT provides verbal and tactile cues for increased trunk rotation to engage core and challenge dynamic balance. Pt has x1-2 small LOBs during activity but is able to steady self without physical assistance.  Pt trials ambulation without AD. PT provides minA via gait belt for stability, with close WC follow +2 for safety. Pt ambulates 50' then 32' with seated rest break. Pt verbalizes fatigue in legs but no buckling noted. Sit to supine with supervision and cues for positioning. Left supine in bed with all needs within reach.  Therapy Documentation Precautions:  Precautions Precautions: Fall Restrictions Weight Bearing Restrictions: No  Therapy/Group: Individual Therapy  Beau Fanny, PT,  DPT 02/18/2021, 12:35 PM

## 2021-02-18 NOTE — Progress Notes (Signed)
PROGRESS NOTE   Subjective/Complaints: He is making such excellent progress!! Very proud of him Lovenox shots causing bruising- given excellent ambulation discussed d/cing and he is in agreement.   ROS: Patient denies CP, SOB, N/V/D   Objective:   No results found. No results for input(s): WBC, HGB, HCT, PLT in the last 72 hours. Recent Labs    02/16/21 0538  NA 139  K 3.7  CL 108  CO2 20*  GLUCOSE 99  BUN 17  CREATININE 1.65*  CALCIUM 9.2    Intake/Output Summary (Last 24 hours) at 02/18/2021 1234 Last data filed at 02/18/2021 0745 Gross per 24 hour  Intake 417 ml  Output 1600 ml  Net -1183 ml        Physical Exam: Vital Signs Blood pressure (!) 145/95, pulse 88, temperature 98.8 F (37.1 C), temperature source Oral, resp. rate 18, height 5\' 10"  (1.778 m), weight (!) 180 kg, SpO2 99 %. Gen: no distress, normal appearing HEENT: oral mucosa pink and moist, NCAT Cardio: Reg rate Chest: normal effort, normal rate of breathing Abd: soft, non-distended Ext: no edema Psych: pleasant, normal affect Musculoskeletal:     Cervical back: Normal range of motion and neck supple.     Comments: No edema or tenderness in extremities  Skin:    General: Skin is warm and dry. MASD sacrum/buttocks--stable in appearance. Diffuse bruising of abdomen from Lovenox injections Neurological:     Alert and oriented x 3. Normal insight and awareness. Intact Memory. Normal language and speech. Cranial nerve exam unremarkable  Motor: 4+/5 RUE, LUE 3- deltoid, 4+/5 tricep, bicep and 5/5 hand. BLE 4 to 4+/5 prox to 4+/5 distally. Sensory exam normal.  Unchanged   Assessment/Plan: 1. Functional deficits which require 3+ hours per day of interdisciplinary therapy in a comprehensive inpatient rehab setting.  Physiatrist is providing close team supervision and 24 hour management of active medical problems listed below.  Physiatrist and  rehab team continue to assess barriers to discharge/monitor patient progress toward functional and medical goals  Care Tool:  Bathing  Bathing activity did not occur: Refused Body parts bathed by patient: Chest,Abdomen,Front perineal area,Face,Left arm   Body parts bathed by helper: Right arm,Buttocks,Right upper leg,Left upper leg,Right lower leg,Left lower leg     Bathing assist Assist Level: Total Assistance - Patient < 25%     Upper Body Dressing/Undressing Upper body dressing   What is the patient wearing?: Pull over shirt    Upper body assist Assist Level: Set up assist    Lower Body Dressing/Undressing Lower body dressing      What is the patient wearing?: Pants,Underwear/pull up     Lower body assist Assist for lower body dressing: Contact Guard/Touching assist     Toileting Toileting    Toileting assist Assist for toileting: Contact Guard/Touching assist     Transfers Chair/bed transfer  Transfers assist     Chair/bed transfer assist level: Supervision/Verbal cueing Chair/bed transfer assistive device:   Ambulation assist      Assist level: 2 helpers Assistive device: No Device Max distance: 170'   Walk 10 feet activity   Assist  Walk 10  feet activity did not occur: Safety/medical concerns  Assist level: 2 helpers Assistive device: No Device   Walk 50 feet activity   Assist Walk 50 feet with 2 turns activity did not occur: Safety/medical concerns  Assist level: 2 helpers Assistive device: No Device    Walk 150 feet activity   Assist Walk 150 feet activity did not occur: Safety/medical concerns  Assist level: 2 helpers Assistive device: No Device    Walk 10 feet on uneven surface  activity   Assist Walk 10 feet on uneven surfaces activity did not occur: Safety/medical concerns         Wheelchair     Assist Will patient use wheelchair at discharge?: No              Wheelchair 50 feet with 2 turns activity    Assist            Wheelchair 150 feet activity     Assist          Blood pressure (!) 145/95, pulse 88, temperature 98.8 F (37.1 C), temperature source Oral, resp. rate 18, height 5\' 10"  (1.778 m), weight (!) 180 kg, SpO2 99 %.    Medical Problem List and Plan: 1.  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   2.  Antithrombotics: -DVT/anticoagulation: Continue Lovenox 82.5 mg daily- d/c given bruising and excellent ambulation             -antiplatelet therapy: Aspirin 81 mg daily and Plavix 85 mg daily 3. Pain Management:d/c oxycodone- not requiring.   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 Mood: Provide emotional support             -antipsychotic agents: N/A 5. Neuropsych: This patient is capable of making decisions on his own behalf. 6. Skin/Wound Care: Routine skin checks  -local care to backside  - specialty mattress in place 7. Fluids/Electrolytes/Nutrition: Routine in and outs 8.  AKI.  Renal function improved.  CRRT discontinued.  3/4 BUN/Cr continues general trend down--> 20/1.65  3/7: encourage hydration and repeat creatinine tomorrow.    3/9: Cr reviewed and is stable, continue to encourage hydration 9.  COVID-19.  Patient completed course of remdesivir as well as steroids.  Contact precautions discontinued 10.  Super obesity.  BMI 53.52 dietary follow-up 11.  Newly diagnosed uncontrolled diabetes mellitus.  Hemoglobin A1c 13.6.  Continue NovoLog 8 units 3 times daily, Levemir 37 units daily  3/9: mildly elevated. Cr elevated on 3/4 to 1.65-cannot start metformin. Provide dietary education. Continue to monitor  AC/HS 12.  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 tach.  Cardiology follow-up.  Cardiac catheterization 01/20/2021 no significant CAD.               3/4 BP/HR remain elevated at rest. Experiencing light-headedness when up at times, however. I witnessed lower BP during an episode yesterday with therapy   -increased metoprolol to 100mg  bid 3/3   -continue to work on acclimation, pacing, sit EOB before OOB   -will try THT (if they can be appropriately placed  on him)   -record orthostatic VS daily   -if sx persists and he's truly orthostatic may need to consult cardiology for recs 13.  Hyperlipidemia: Lipitor 14. Uncontrolled hypertension to 168/106: Hydralazine 10mg  TID started  3/9: much better controlled- continue to monitor  3/10: elevated. flowsheet reviewed. Will increase hydralazine to 25 TID  LOS: 15 days A FACE TO FACE EVALUATION WAS PERFORMED  5/9 Raulkar 02/18/2021, 12:34 PM

## 2021-02-18 NOTE — Progress Notes (Signed)
Occupational Therapy Weekly Progress Note  Patient Details  Name: Erik [name] MRN: [id number] Date of Birth: [birth year]  Beginning of progress report period: February 04, 2021 End of progress report period: February 18, 2021  Today's Date: 02/18/2021 OT Individual Time: 1025-8527 OT Individual Time Calculation (min): 58 min   Patient has met 3 of 3 short term goals.  Erik [name] is making excellent progress towards his goals. He is at an overall min/CGA level for BADl tasks and functional transfers. Functional endurance continues to improve with patient requiring less rest breaks within functional tasks. Continue current POC.  Patient continues to demonstrate the following deficits: muscle weakness, decreased cardiorespiratoy endurance and decreased standing balance, decreased postural control and decreased balance strategies and therefore will continue to benefit from skilled OT intervention to enhance overall performance with BADL and Reduce care partner burden.  Patient progressing toward long term goals..  Continue plan of care.  OT Short Term Goals Week 2:  OT Short Term Goal 1 (Week 2): Pt will maintain standing at the sink with min A of 1 person for 2 minutes in prep for BADL task OT Short Term Goal 1 - Progress (Week 2): Met OT Short Term Goal 2 (Week 2): Pt will use AE for LB dressing and mod A OT Short Term Goal 2 - Progress (Week 2): Met OT Short Term Goal 3 (Week 2): Pt will complete toilet transfer with mod A of 1 OT Short Term Goal 3 - Progress (Week 2): Met Week 3:  OT Short Term Goal 1 (Week 3): LTG=STG 2/2 ELOS  Skilled Therapeutic Interventions/Progress Updates:    Patient greeted semi-reclined in bed and agreeable to shower today. Bed mobility completed with HOB elevated and supervision. Sit<>stand w/ RW and CGA, then CGA to ambulate into bathroom and transfer onto bariatric BSC over toilet. Pt with successful BM and void of bladder in urinal 2/2 body habitus. Pt able to  perform peri-care in standing with set-up A. Bathing completed from bariatric BSC in shower with use of LH sponge. Dressing from wc with set-up A and CGA to pull up pants. Worked on standingbalance/ednraunce with standing for grooming tasks and no LOB. Pt donned socks and shoes at EOB using figure 4 position. Pt then ambulated w/ RW and CGA to the day room and needed 1 standing rest break and seated rest break prior to returning to room. Pt returned to bed at end of session with supervision.  Therapy Documentation Precautions:  Precautions Precautions: Fall Restrictions Weight Bearing Restrictions: No Pain:  denies pain  Therapy/Group: Individual Therapy  Valma Cava 02/18/2021, 8:55 AM

## 2021-02-18 NOTE — Progress Notes (Signed)
Occupational Therapy Session Note  Patient Details  Name: Erik [name] MRN: [id number] Date of Birth: February 07, 1991  Today's Date: 02/18/2021 OT Individual Time: 1445-1530 OT Individual Time Calculation (min): 45 min   Short Term Goals: Week 3:  OT Short Term Goal 1 (Week 3): LTG=STG 2/2 ELOS  Skilled Therapeutic Interventions/Progress Updates:    Pt greeted semi-reclined in bed and agreeable to OT treatment session. Pt reported need to go to the bathroom. Pt completed bed mobility with supervision. He donned shoes at EOB with set-up A, then ambulated into bathroom with RW and CGA. Pt stood to urinate in urinal. Pt had 1 posterior LOB when standing to urinate requiring min A to correct. Pt tolerated standing to wash, hands, then needed to sit and rest. Pt brought to tub room and educated on options for tub transfer bench. Patient will need heavy duty bariatric tub bench and looked on [place] with patient to get pricing. Most of these options are out of patients price range. Pt reported he will likely just sponge bathe upon dc until he is strong enough to stop over tub ledge. Worked on L shoulder mobility using BITS to trace vertical and horizontal lines. Guided A from OT to decrease accessory shoulder hike. 3 sets of 10 L shoulder punch out. Pt returned to room and ambulated 10 feet back to bed with RW and supervision. Pt left semi-reclined in bed with call bell in reach and needs met.   Therapy Documentation Precautions:  Precautions Precautions: Fall Restrictions Weight Bearing Restrictions: No Pain:  denies pain  Therapy/Group: Individual Therapy  Valma Cava 02/18/2021, 3:09 PM

## 2021-02-19 LAB — GLUCOSE, CAPILLARY
Glucose-Capillary: 79 mg/dL (ref 70–99)
Glucose-Capillary: 104 mg/dL — ABNORMAL HIGH (ref 70–99)
Glucose-Capillary: 85 mg/dL (ref 70–99)
Glucose-Capillary: 100 mg/dL — ABNORMAL HIGH (ref 70–99)

## 2021-02-19 LAB — BASIC METABOLIC PANEL
Anion gap: 8 (ref 5–15)
BUN: 17 mg/dL (ref 6–20)
CO2: 24 mmol/L (ref 22–32)
Calcium: 9 mg/dL (ref 8.9–10.3)
Chloride: 108 mmol/L (ref 98–111)
Creatinine, Ser: 1.59 mg/dL — ABNORMAL HIGH (ref 0.61–1.24)
GFR, Estimated: 60 mL/min — ABNORMAL LOW (ref >60)
Glucose, Bld: 86 mg/dL (ref 70–99)
Potassium: 3.9 mmol/L (ref 3.5–5.1)
Sodium: 140 mmol/L (ref 135–145)

## 2021-02-19 MED ORDER — HYDRALAZINE HCL 50 MG PO TABS
50.0000 mg | ORAL_TABLET | Freq: Three times a day (TID) | ORAL | Status: DC
  Administered 2021-02-19 – 2021-02-26 (×21): 50 mg via ORAL
  Filled 2021-02-19 (×21): qty 1

## 2021-02-19 NOTE — Progress Notes (Signed)
PROGRESS NOTE   Subjective/Complaints: No complaints this morning. Cr trending downward.  BP elevated.     ROS: Patient denies CP, SOB, N/V/D  Objective:   No results found. No results for input(s): WBC, HGB, HCT, PLT in the last 72 hours. Recent Labs    02/19/21 0503  NA 140  K 3.9  CL 108  CO2 24  GLUCOSE 86  BUN 17  CREATININE 1.59*  CALCIUM 9.0    Intake/Output Summary (Last 24 hours) at 02/19/2021 1058 Last data filed at 02/19/2021 0735 Gross per 24 hour  Intake 1080 ml  Output 1700 ml  Net -620 ml        Physical Exam: Vital Signs Blood pressure (!) 153/90, pulse 92, temperature 97.9 F (36.6 C), temperature source Oral, resp. rate 16, height 5\' 10"  (1.778 m), weight (!) 180 kg, SpO2 100 %. Gen: no distress, normal appearing HEENT: oral mucosa pink and moist, NCAT Cardio: Reg rate Chest: normal effort, normal rate of breathing Abd: soft, non-distended Ext: no edema Psych: pleasant, normal affect Musculoskeletal:     Cervical back: Normal range of motion and neck supple.     Comments: No edema or tenderness in extremities  Skin:    General: Skin is warm and dry. MASD sacrum/buttocks--stable in appearance. Diffuse bruising of abdomen from Lovenox injections Neurological:     Alert and oriented x 3. Normal insight and awareness. Intact Memory. Normal language and speech. Cranial nerve exam unremarkable  Motor: 4+/5 RUE, LUE 3- deltoid, 4+/5 tricep, bicep and 5/5 hand. BLE 4 to 4+/5 prox to 4+/5 distally. Sensory exam normal.  Unchanged   Assessment/Plan: 1. Functional deficits which require 3+ hours per day of interdisciplinary therapy in a comprehensive inpatient rehab setting.  Physiatrist is providing close team supervision and 24 hour management of active medical problems listed below.  Physiatrist and rehab team continue to assess barriers to discharge/monitor patient progress toward  functional and medical goals  Care Tool:  Bathing  Bathing activity did not occur: Refused Body parts bathed by patient: Chest,Abdomen,Front perineal area,Face,Left arm   Body parts bathed by helper: Right arm,Buttocks,Right upper leg,Left upper leg,Right lower leg,Left lower leg     Bathing assist Assist Level: Total Assistance - Patient < 25%     Upper Body Dressing/Undressing Upper body dressing   What is the patient wearing?: Pull over shirt    Upper body assist Assist Level: Set up assist    Lower Body Dressing/Undressing Lower body dressing      What is the patient wearing?: Pants,Underwear/pull up     Lower body assist Assist for lower body dressing: Contact Guard/Touching assist     Toileting Toileting    Toileting assist Assist for toileting: Contact Guard/Touching assist     Transfers Chair/bed transfer  Transfers assist     Chair/bed transfer assist level: Supervision/Verbal cueing Chair/bed transfer assistive device:   Ambulation assist      Assist level: 2 helpers Assistive device: No Device Max distance: 170'   Walk 10 feet activity   Assist  Walk 10 feet activity did not occur: Safety/medical concerns  Assist level: 2 helpers Assistive  device: No Device   Walk 50 feet activity   Assist Walk 50 feet with 2 turns activity did not occur: Safety/medical concerns  Assist level: 2 helpers Assistive device: No Device    Walk 150 feet activity   Assist Walk 150 feet activity did not occur: Safety/medical concerns  Assist level: 2 helpers Assistive device: No Device    Walk 10 feet on uneven surface  activity   Assist Walk 10 feet on uneven surfaces activity did not occur: Safety/medical concerns         Wheelchair     Assist Will patient use wheelchair at discharge?: No             Wheelchair 50 feet with 2 turns activity    Assist            Wheelchair 150 feet  activity     Assist          Blood pressure (!) 153/90, pulse 92, temperature 97.9 F (36.6 C), temperature source Oral, resp. rate 16, height 5\' 10"  (1.778 m), weight (!) 180 kg, SpO2 100 %.    Medical Problem List and Plan: 1.  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   2.  Antithrombotics: -DVT/anticoagulation: Continue Lovenox 82.5 mg daily- d/c given bruising and excellent ambulation             -antiplatelet therapy: Aspirin 81 mg daily and Plavix 85 mg daily 3. Pain Management:d/c oxycodone- not requiring.   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 Mood: Provide emotional support             -antipsychotic agents: N/A 5. Neuropsych: This patient is capable of making decisions on his own behalf. 6. Skin/Wound Care: Routine skin checks  -local care to backside  - specialty mattress in place 7. Fluids/Electrolytes/Nutrition: Routine in and outs 8.  AKI.  Renal function improved.  CRRT discontinued.  3/11: Cr is trending downward to 1.59- encouraged hydration. Repeat Monday.  9.  COVID-19.  Patient completed course of remdesivir as well as steroids.  Contact precautions discontinued 10.  Super obesity.  BMI 53.52 dietary follow-up 11.  Newly diagnosed uncontrolled diabetes mellitus.  Hemoglobin A1c 13.6.  Continue NovoLog 8 units 3 times daily, Levemir 37 units daily  3/11: mildly elevated. Cr elevated on 3/11 to 1.59-cannot start metformin. Provide dietary education. Continue to monitor AC/HS. Discussed that this may be a good option for him outpatient as his creatinine normalizes. 12.  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 tach.  Cardiology follow-up.   Cardiac catheterization 01/20/2021 no significant CAD.   13.  Hyperlipidemia: Continue Lipitor 14. Uncontrolled hypertension to 168/106: Hydralazine 10mg  TID started  3/11: elevated. flowsheet reviewed. Will increase hydralazine to 50 TID  LOS: 16 days A FACE TO FACE EVALUATION WAS PERFORMED  Kadarrius Yanke 02/19/2021, 10:58 AM

## 2021-02-19 NOTE — Progress Notes (Signed)
Occupational Therapy Session Note  Patient Details  Name: Erik [name] MRN: [id number] Date of Birth: [birth year]  Today's Date: 02/19/2021 OT Individual Time: 1100-1200 OT Individual Time Calculation (min): 60 min   Short Term Goals: Week 3:  OT Short Term Goal 1 (Week 3): LTG=STG 2/2 ELOS  Skilled Therapeutic Interventions/Progress Updates:    Pt greeted seated in wc and agreeable to OT treatment session. Pt declined to shower today. Pt brought down to therapy gym and worked on [employer] there-ex on [place] arm bike. Pt completed 1 minute intervals with L UE, then 2 minutes with B UEs. For 12 mins. Standing balance/endurance with unweighted ball toss on trampoline, progressing to 2 lb ball toss. Attempted overhead toss, but difficulty getting L UE up. Pt ambulated in room with RW and supervision. Pt left semi-reclined in bed with bed alarm on, call bell in reach, and needs met.   Therapy Documentation Precautions:  Precautions Precautions: Fall Restrictions Weight Bearing Restrictions: No Pain: Pain Assessment Pain Scale: 0-10 Pain Score: 0-No pain   Therapy/Group: Individual Therapy  Valma Cava 02/19/2021, 12:05 PM

## 2021-02-19 NOTE — Progress Notes (Signed)
Physical Therapy Weekly Progress Note  Patient Details  Name: Erik [name] MRN: [id number] Date of Birth: 12/03/1991  Beginning of progress report period: February 11, 2021 End of progress report period: February 19, 2021  Today's Date: 02/19/2021 PT Individual Time: 1002-1100 PT Individual Time Calculation (min): 58 min   Patient has met 4 of 4 short term goals. Pt is progressing very well toward mobility goals, improving independence with all aspects of mobility. Pt is consistently performing bed mobility at supervision to mod(I) level, transfers with CGA, and is now ambulating without AD. Pt continues to have weakness and endurance deficits, especially in L hemibody, and will benefit from ongoing focus on balance, ambulation, and strength.  Patient continues to demonstrate the following deficits muscle weakness, decreased cardiorespiratoy endurance and decreased standing balance, decreased postural control and decreased balance strategies and therefore will continue to benefit from skilled PT intervention to increase functional independence with mobility.  Patient progressing toward long term goals..  Continue plan of care.  PT Short Term Goals Week 2:  PT Short Term Goal 1 (Week 2): Pt will perform bed mobility consistently with supervision. PT Short Term Goal 1 - Progress (Week 2): Met PT Short Term Goal 2 (Week 2): Pt will perform sit to stand transfers consistently with CGA. PT Short Term Goal 2 - Progress (Week 2): Met PT Short Term Goal 3 (Week 2): Pt will perform bed to chair transfer consistently with CGA. PT Short Term Goal 3 - Progress (Week 2): Met PT Short Term Goal 4 (Week 2): Pt will ambulate 150' with minA +1 and LRAD. PT Short Term Goal 4 - Progress (Week 2): Met Week 3:     Skilled Therapeutic Interventions/Progress Updates:  Ambulation/gait training;Community reintegration;DME/adaptive equipment instruction;Neuromuscular re-education;Psychosocial support;Stair  training;UE/LE Strength taining/ROM;Wheelchair propulsion/positioning;UE/LE Coordination activities;Therapeutic Activities;Skin care/wound management;Pain management;Functional electrical stimulation;Discharge planning;Balance/vestibular training;Cognitive remediation/compensation;Disease management/prevention;Functional mobility training;Patient/family education;Splinting/orthotics;Therapeutic Exercise;Visual/perceptual remediation/compensation   Pt received supine in bed and agrees to therapy. Supine to sit mod(I) with bed features. Pt dons socks and shoes at EOB independently. Pt performs stand pivot transfer to WC with supervision and cues for positioning. WC transport to gym for time management. Pt performs x4 bouts of ambulation, x100' each, without AD and with CGA from PT. Pt demos increased postural sway and occasional slight LOB, but no physical assistance required for balance. Pt's HR following each bout of ambulation in 120s. Seated rest breaks taken until HR decreases to ~110 and then pt ambulates subsequent bout. PT cues for upright gaze to improve posture and balance, and pursed lip breathing to optimize recovery. Pt then performs transfer training/strengthening, performing multiple reps of sit to stand, facing mirror for visual feedback. Pt performs 4x7 reps sit to stand, with PT cueing or increased eccentric control on lowering to seated position and increased power when performing sit to stand. Extended seated rest breaks between bouts. Pt left seated in WC with all needs within reach.  Therapy Documentation Precautions:  Precautions Precautions: Fall Restrictions Weight Bearing Restrictions: No   Therapy/Group: Individual Therapy  William C Allen, PT, DPT 02/19/2021, 4:01 PM   

## 2021-02-19 NOTE — Progress Notes (Addendum)
Physical Therapy Session Note  Patient Details  Name: Erik [name] MRN: [id number] Date of Birth: 1991/10/29  Today's Date: 02/19/2021 PT Individual Time: 0998-3382 PT Individual Time Calculation (min): 57 min   Short Term Goals: Week 2:  PT Short Term Goal 1 (Week 2): Pt will perform bed mobility consistently with supervision. PT Short Term Goal 2 (Week 2): Pt will perform sit to stand transfers consistently with CGA. PT Short Term Goal 3 (Week 2): Pt will perform bed to chair transfer consistently with CGA. PT Short Term Goal 4 (Week 2): Pt will ambulate 150' with minA +1 and LRAD.  Skilled Therapeutic Interventions/Progress Updates: Pt presents supine in bed but eager to participate w/ therapy.  Pt transfers sup to sit w/ mod A.  Pt able to don socks and shoes supervised at EOB using Figure-4 position.  Pt transfers supervised and amb 5' w/ HHA to w/c.  Pt wheeled to gym for time and energy conservation.  Pt performed sit to stand transfers x 10 for 3 trials w/o UE assist.  Pt performed standing bouncing T-ball, toe taps to cone top w/ HHA.  Pt performed standing UBE at Level 1 , 1' x 2 trials.  Pt performed standing ball "dunks" to low setting of hoop w/ BUEs and then RUE only reaching across midline.  Pt tolerated static standing on [employer] cushion w/ HHA to step on/off.  Pt requires seated rest breaks between each trial 2/2 fatigue.  Pt returned to room and amb from hallway to bed (15') w/ HHA, min A.  Bed alarm on and all needs in reach.     Therapy Documentation Precautions:  Precautions Precautions: Fall Restrictions Weight Bearing Restrictions: No General:   Vital Signs:  Pain:   Mobility:   Locomotion :    Trunk/Postural Assessment :    Balance:   Exercises:   Other Treatments:      Therapy/Group: Individual Therapy  Lucio Edward 02/19/2021, 2:32 PM

## 2021-02-19 NOTE — Consult Note (Signed)
Neuropsychological Consultation   Patient:   Erik [name]   DOB:   03/29/1991  MR Number:  [id number]  Location:  [facility] [address] CENTER A [id number] [place] [address] Dept: [phone] Loc: [phone]           Date of Service:   02/19/2021  Start Time:   9 AM End Time:   10 AM  Provider/Observer:  Arley Phenix, Psy.D.       Clinical Neuropsychologist       Billing Code/Service: [id number]  Chief Complaint:    Erik [name] is a 30 year old male with unremarkable past medical history except obesity.  Patient presented on 01/12/2021 with progressive weakness, poor food intake, lethargy over the prior 2 weeks.  Denies any fever.  Patient was evaluated and was positive for Covid pneumonia.  Patient initially required intubation for airway protection.  Hospital course was complicated by increasing chest pain on 01/19/2021 and EKG showing MI/STEMI as well as episode of sustained ventricular tachycardia and was taken to the cardiac Cath Lab on 01/20/2021 that showed no significant CAD.  Patient has been followed in the CIR and has been showing significant improvements.  Plan discharge is only 18th of this month but the patient has been making progressive steady improvements.  Reason for Service:  Patient was referred for neuropsychological consultation due to coping adjustment with extended hospital stay and significant debility following Covid 19 pneumonia.  Below is the HPI for the current admission.  HPI: Erik [name] is a 30 year old right-handed male with unremarkable past medical history except obesity with BMI 49.64 on no prescription medications and is not followed by a medical provider.  History taken from chart review and patient.  Patient lives with parent.  Multilevel home bed and bath upstairs.  Independent prior to admission.  He presented on 01/12/2021 with progressive weakness, poor p.o. intake, lethargy x2  weeks.  Denied any fever.  Patient with bouts of nausea vomiting.  Chest x-ray showed cardiomegaly.  No pneumothorax.  Bilateral pulmonary infiltrate/edema admission chemistry sodium 142, potassium 5.7, glucose 802 BUN 74 creatinine 3.06 total bilirubin 1.8, WBC 17,300, hemoglobin 15.8, lactic acid 3.3, blood cultures no growth to date, hemoglobin A1c 13.6 SARS coronavirus positive.  Patient was placed on intravenous remdesivir as well as Decadron.  Airborne and contact precautions implemented and discontinued on 02/01/2021 and patient currently maintained on room air.  Renal ultrasound showed no obstructive uropathy.  Patient did initially require intubation for airway protection.  Renal service was consulted and CRRT initiated.  Hospital course further complicated by increasing chest pain on 01/19/2021.  EKG SHOWING MI/STEMI as well as episode of sustained ventricular tachycardia taken to the cardiac Cath Lab on 01/20/2021 that showed no significant CAD suspect myocarditis and continues to be followed by Dr. Cristal [place].  Echocardiogram with ejection fraction of 60-65%.  Patient was placed on metoprolol as well as low-dose aspirin and Plavix.  Lovenox ongoing for DVT prophylaxis.  Insulin initiate as well as follow-up diabetic coordinator for diabetes mellitus.  AKI has resolved CRRT discontinued latest creatinine 1.69.  He is tolerating a regular diet.  Therapy evaluations completed due to patient decreased functional mobility and endurance and was admitted for a comprehensive rehab program.  Please see preadmission assessment from earlier today as well.  Current Status:  Today was my second visit with the patient and he clearly remembered our previous visit.  The patient was alert  and oriented and reported generally good mood state but acknowledged that extended hospital stay has been difficult for him to adjust to.  Patient reports that he continues to be motivated with therapies and is seen progressive gains  in achievements he is making an therapies.  Patient remains concerned about possible worsening of status if any other medical complications develop.  We addressed issues related to anxiety/worry around medical status and noted to have significant improvement.  Behavioral Observation: Erik [name]  presents as a 30 y.o.-year-old Right African American Male who appeared his stated age. his dress was Appropriate and he was Well Groomed and his manners were Appropriate to the situation.  his participation was indicative of Appropriate and Attentive behaviors.  There were physical disabilities noted.  he displayed an appropriate level of cooperation and motivation.     Interactions:    Active Appropriate  Attention:   within normal limits and attention span and concentration were age appropriate  Memory:   within normal limits; recent and remote memory intact  Visuo-spatial:  not examined  Speech (Volume):  low  Speech:   normal; normal  Thought Process:  Coherent and Relevant  Though Content:  WNL; not suicidal and not homicidal  Orientation:   person, place, time/date and situation  Judgment:   Good  Planning:   Fair  Affect:    Anxious  Mood:    Anxious  Insight:   Good  Intelligence:   normal  Medical History:   Past Medical History:  Diagnosis Date  . Asthma   . Diabetes mellitus without complication [facility])          Patient Active Problem List   Diagnosis Date Noted  . Debility 02/03/2021  . Super obesity   . Dyslipidemia   . VT (ventricular tachycardia) (HCC)   . Elevated troponin   . Acute respiratory failure with hypoxia (HCC)   . ST elevation myocardial infarction (STEMI) (HCC)   . Acute hypoxemic respiratory failure due to COVID-19 (HCC) 01/12/2021  . DKA (diabetic ketoacidosis) (HCC) 01/12/2021  . Obesity, Class III, BMI 40-49.9 (morbid obesity) (HCC) 01/12/2021  . Acute metabolic encephalopathy 01/12/2021  . ARDS (adult respiratory distress syndrome)  (HCC) 01/12/2021     Psychiatric History:  No prior psychiatric history  Family Med/Psych History: History reviewed. No pertinent family history.  Impression/DX:  Erik [name] is a 30 year old male with unremarkable past medical history except obesity.  Patient presented on 01/12/2021 with progressive weakness, poor food intake, lethargy over the prior 2 weeks.  Denies any fever.  Patient was evaluated and was positive for Covid pneumonia.  Patient initially required intubation for airway protection.  Hospital course was complicated by increasing chest pain on 01/19/2021 and EKG showing MI/STEMI as well as episode of sustained ventricular tachycardia and was taken to the cardiac Cath Lab on 01/20/2021 that showed no significant CAD.  Patient has been followed in the CIR and has been showing significant improvements.  Plan discharge is only 18th of this month but the patient has been making progressive steady improvements.  Today was my second visit with the patient and he clearly remembered our previous visit.  The patient was alert and oriented and reported generally good mood state but acknowledged that extended hospital stay has been difficult for him to adjust to.  Patient reports that he continues to be motivated with therapies and is seen progressive gains in achievements he is making an therapies.  Patient remains concerned about possible worsening  of status if any other medical complications develop.  We addressed issues related to anxiety/worry around medical status and noted to have significant improvement.    Diagnosis:    Debility with recent COVID-19           Electronically Signed   _______________________ Arley Phenix, Psy.D. Clinical Neuropsychologist

## 2021-02-20 DIAGNOSIS — N179 Acute kidney failure, unspecified: Secondary | ICD-10-CM

## 2021-02-20 DIAGNOSIS — I10 Essential (primary) hypertension: Secondary | ICD-10-CM

## 2021-02-20 DIAGNOSIS — E119 Type 2 diabetes mellitus without complications: Secondary | ICD-10-CM

## 2021-02-20 DIAGNOSIS — E785 Hyperlipidemia, unspecified: Secondary | ICD-10-CM

## 2021-02-20 LAB — GLUCOSE, CAPILLARY
Glucose-Capillary: 119 mg/dL — ABNORMAL HIGH (ref 70–99)
Glucose-Capillary: 115 mg/dL — ABNORMAL HIGH (ref 70–99)
Glucose-Capillary: 107 mg/dL — ABNORMAL HIGH (ref 70–99)

## 2021-02-20 NOTE — Progress Notes (Signed)
Occupational Therapy Session Note  Patient Details  Name: Erik [name] MRN: [id number] Date of Birth: 13-Jan-1991  Today's Date: 02/20/2021 OT Individual Time: 2099-0689 OT Individual Time Calculation (min): 63 min    Short Term Goals: Week 3:  OT Short Term Goal 1 (Week 3): LTG=STG 2/2 ELOS  Skilled Therapeutic Interventions/Progress Updates:   Pt supine with no c/o pain, in good spirits and agreeable to OT session starting with shower. Bed mobility with use of bed rails with supervision. Sit >stand from EOB with supervision. RW ambulatory transfer into the bathroom with supervision. Pt able to transfer to bariatric BSC in shower and doff all clothing with close supervision. Discussed home shower and set up. Pt completed UB bathing with set up assist. Close supervision for LB bathing with use of LH sponge. CGA provided during standing level peri hygiene. Pt reported dizziness in standing and returned to sitting, with the symptoms alleviating. Pt had no further c/o dizziness after return to w/c. BP was assessed once he was seated and it read 95/57. Pt donned underwear with min A to pull up posteriorly and reported some blurred vision. BP was 101/67. Pt required an extended rest break d/t fatigue following LB dressing. HR remained elevated throughout session, stabilizing around 120 bpm with activity, coming down to 110-115 bpm with rest. Pt completed oral care with set up assist while seated. Assisted pt with hair care to exfoliate staff. Pt was taken to therapy gym via w/c for time management. Pt completed 5 min on the BUE ergometer to increase BUE strength/endurnace needed for ADL/IADL independence. Pt returned to his room and was left sitting up with all needs met.   Therapy Documentation Precautions:  Precautions Precautions: Fall Restrictions Weight Bearing Restrictions: No   Therapy/Group: Individual Therapy  Curtis Sites 02/20/2021, 6:45 AM

## 2021-02-20 NOTE — Progress Notes (Signed)
Physical Therapy Session Note  Patient Details  Name: Erik [name] MRN: [id number] Date of Birth: [birth year]  Today's Date: 02/20/2021 PT Individual Time: 3374-4514 PT Individual Time Calculation (min): 45 min   Short Term Goals: Week 1:  PT Short Term Goal 1 (Week 1): Pt will perform bed mobility at minA PT Short Term Goal 1 - Progress (Week 1): Met PT Short Term Goal 2 (Week 1): Pt will perform bed to chair transfers with modA PT Short Term Goal 2 - Progress (Week 1): Met PT Short Term Goal 3 (Week 1): Pt will perform sit to stand transfer with minA. PT Short Term Goal 3 - Progress (Week 1): Met PT Short Term Goal 4 (Week 1): Pt will ambulate x25' with modA +2. PT Short Term Goal 4 - Progress (Week 1): Met Week 2:  PT Short Term Goal 1 (Week 2): Pt will perform bed mobility consistently with supervision. PT Short Term Goal 1 - Progress (Week 2): Met PT Short Term Goal 2 (Week 2): Pt will perform sit to stand transfers consistently with CGA. PT Short Term Goal 2 - Progress (Week 2): Met PT Short Term Goal 3 (Week 2): Pt will perform bed to chair transfer consistently with CGA. PT Short Term Goal 3 - Progress (Week 2): Met PT Short Term Goal 4 (Week 2): Pt will ambulate 150' with minA +1 and LRAD. PT Short Term Goal 4 - Progress (Week 2): Met      Skilled Therapeutic Interventions/Progress Updates:    pt received in bed and agreeable to therapy. Pt denied pain throughout. Pt directed in supine>sit supervision; sat EOB supervision and directed in Sit to stand with Rolling walker CGA. Pt then x3 steps to WC at [place]. Pt taken to gym in [facility] total A for time and energy. Pt then directed in 300' with Rolling walker at SBA. Pt then directed in 150' without AD CGA then required rest break then additional 150' no AD CGA with noted lateral trunk sway however no LOB. Pt fatigued and required prolonged rest break after this. Pt then directed in seated BUE strengthening exercises 3# arm bar of bicep  curls, chest press and front raise to shoulder height. Pt then taken back to room in [facility] total A for time and energy. Pt directed in ambulatory transfer to bed without AD CGA, sit>supine SBA. Pt left in bed, All needs in reach and in good condition. Call light in hand.    Therapy Documentation Precautions:  Precautions Precautions: Fall Restrictions Weight Bearing Restrictions: No General:   Vital Signs:  Pain: Pain Assessment Pain Scale: 0-10 Pain Score: 0-No pain Mobility:   Locomotion :    Trunk/Postural Assessment :    Balance:   Exercises:   Other Treatments:      Therapy/Group: Individual Therapy  Junie Panning 02/20/2021, 12:01 PM

## 2021-02-20 NOTE — Progress Notes (Signed)
PROGRESS NOTE   Subjective/Complaints: Patient seen laying in bed this morning.  He states he slept well overnight.  He denies complaints.  ROS: Denies CP, SOB, N/V/D  Objective:   No results found. No results for input(s): WBC, HGB, HCT, PLT in the last 72 hours. Recent Labs    02/19/21 0503  NA 140  K 3.9  CL 108  CO2 24  GLUCOSE 86  BUN 17  CREATININE 1.59*  CALCIUM 9.0    Intake/Output Summary (Last 24 hours) at 02/20/2021 1007 Last data filed at 02/20/2021 0720 Gross per 24 hour  Intake 960 ml  Output 1475 ml  Net -515 ml        Physical Exam: Vital Signs Blood pressure (!) 142/84, pulse 87, temperature 98 F (36.7 C), resp. rate 19, height 5\' 10"  (1.778 m), weight (!) 180 kg, SpO2 99 %. Constitutional: No distress . Vital signs reviewed. HENT: Normocephalic.  Atraumatic. Eyes: EOMI. No discharge. Cardiovascular: No JVD.  RRR. Respiratory: Normal effort.  No stridor.  Bilateral clear to auscultation. GI: Non-distended.  BS +. Skin: Warm and dry.   Psych: Normal mood.  Normal behavior. Musc: No edema in extremities.  No tenderness in extremities. Neuro: Alert Motor: 4+/5 throughout, except for left proximal upper extremity slightly decreased  Assessment/Plan: 1. Functional deficits which require 3+ hours per day of interdisciplinary therapy in a comprehensive inpatient rehab setting.  Physiatrist is providing close team supervision and 24 hour management of active medical problems listed below.  Physiatrist and rehab team continue to assess barriers to discharge/monitor patient progress toward functional and medical goals  Care Tool:  Bathing  Bathing activity did not occur: Refused Body parts bathed by patient: Chest,Abdomen,Front perineal area,Face,Left arm   Body parts bathed by helper: Right arm,Buttocks,Right upper leg,Left upper leg,Right lower leg,Left lower leg     Bathing assist  Assist Level: Total Assistance - Patient < 25%     Upper Body Dressing/Undressing Upper body dressing   What is the patient wearing?: Pull over shirt    Upper body assist Assist Level: Set up assist    Lower Body Dressing/Undressing Lower body dressing      What is the patient wearing?: Pants,Underwear/pull up     Lower body assist Assist for lower body dressing: Contact Guard/Touching assist     Toileting Toileting    Toileting assist Assist for toileting: Contact Guard/Touching assist     Transfers Chair/bed transfer  Transfers assist     Chair/bed transfer assist level: Supervision/Verbal cueing Chair/bed transfer assistive device:   Ambulation assist      Assist level: Contact Guard/Touching assist Assistive device: No Device Max distance: 100'   Walk 10 feet activity   Assist  Walk 10 feet activity did not occur: Safety/medical concerns  Assist level: Moderate Assistance - Patient - 50 - 74% Assistive device: No Device   Walk 50 feet activity   Assist Walk 50 feet with 2 turns activity did not occur: Safety/medical concerns  Assist level: Contact Guard/Touching assist Assistive device: No Device    Walk 150 feet activity   Assist Walk 150 feet activity did not occur:  Safety/medical concerns  Assist level: 2 helpers Assistive device: No Device    Walk 10 feet on uneven surface  activity   Assist Walk 10 feet on uneven surfaces activity did not occur: Safety/medical concerns         Wheelchair     Assist Will patient use wheelchair at discharge?: No             Wheelchair 50 feet with 2 turns activity    Assist            Wheelchair 150 feet activity     Assist          Blood pressure (!) 142/84, pulse 87, temperature 98 F (36.7 C), resp. rate 19, height 5\' 10"  (1.778 m), weight (!) 180 kg, SpO2 99 %.    Medical Problem List and Plan: 1.  Debility secondary to  COVID-19 with course complicated by MI/STEMI as well as AKI. 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  Antithrombotics: -DVT/anticoagulation: Continue Lovenox 82.5 mg daily- d/c given bruising and excellent ambulation             -antiplatelet therapy: Aspirin 81 mg daily and Plavix 85 mg daily 3. Pain Management:  D/c oxycodone- not requiring.   Has Kpad as well  Controlled on 3/12 4. Mood: Provide emotional support             -antipsychotic agents: N/A 5. Neuropsych: This patient is capable of making decisions on his own behalf. 6. Skin/Wound Care: Routine skin checks  -local care to backside  - specialty mattress in place 7. Fluids/Electrolytes/Nutrition: Routine in and outs 8.  AKI.  Renal function improved.  CRRT discontinued.  Creatinine 1.59 on 3/11, labs ordered for Monday  Encourage fluids 9.  COVID-19.  Patient completed course of remdesivir as well as steroids.  Contact precautions discontinued 10.  Super obesity.  BMI 53.52 dietary follow-up 11.  Newly diagnosed uncontrolled diabetes mellitus.  Hemoglobin A1c 13.6.  Continue NovoLog 8 units 3 times daily, Levemir 37 units daily  Good control on 3/12 12.  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 tach.  Cardiology follow-up.  Cardiac catheterization 01/20/2021 no significant CAD.   13.  Hyperlipidemia: Lipitor 14. Uncontrolled hypertension:  Increased hydralazine to 50 TID  Labile on 3/12, monitor for trend  LOS: 17 days A FACE TO FACE EVALUATION WAS PERFORMED  Geralyn Figiel 5/12 02/20/2021, 10:07 AM

## 2021-02-21 LAB — GLUCOSE, CAPILLARY
Glucose-Capillary: 83 mg/dL (ref 70–99)
Glucose-Capillary: 102 mg/dL — ABNORMAL HIGH (ref 70–99)
Glucose-Capillary: 101 mg/dL — ABNORMAL HIGH (ref 70–99)
Glucose-Capillary: 80 mg/dL (ref 70–99)
Glucose-Capillary: 79 mg/dL (ref 70–99)

## 2021-02-21 MED ORDER — AMLODIPINE BESYLATE 2.5 MG PO TABS
2.5000 mg | ORAL_TABLET | Freq: Every day | ORAL | Status: DC
  Administered 2021-02-21 – 2021-02-24 (×4): 2.5 mg via ORAL
  Filled 2021-02-21 (×4): qty 1

## 2021-02-21 NOTE — Progress Notes (Signed)
PROGRESS NOTE   Subjective/Complaints: Patient seen laying in bed this morning.  He states he slept well overnight.  ROS: Denies CP, SOB, N/V/D  Objective:   No results found. No results for input(s): WBC, HGB, HCT, PLT in the last 72 hours. Recent Labs    02/19/21 0503  NA 140  K 3.9  CL 108  CO2 24  GLUCOSE 86  BUN 17  CREATININE 1.59*  CALCIUM 9.0    Intake/Output Summary (Last 24 hours) at 02/21/2021 0803 Last data filed at 02/21/2021 0719 Gross per 24 hour  Intake 1069 ml  Output 2050 ml  Net -981 ml        Physical Exam: Vital Signs Blood pressure (!) 159/99, pulse 89, temperature 98.3 F (36.8 C), resp. rate 18, height 5\' 10"  (1.778 m), weight (!) 180 kg, SpO2 100 %. Constitutional: No distress . Vital signs reviewed. HENT: Normocephalic.  Atraumatic. Eyes: EOMI. No discharge. Cardiovascular: No JVD.  RRR. Respiratory: Normal effort.  No stridor.  Bilateral clear to auscultation. GI: Non-distended.  BS +. Skin: Warm and dry.  Intact. Psych: Normal mood.  Normal behavior. Musc: No edema in extremities.  No tenderness in extremities. Neuro: Alert Motor: 4+/5 throughout, except for left proximal upper extremity slightly decreased, unchanged  Assessment/Plan: 1. Functional deficits which require 3+ hours per day of interdisciplinary therapy in a comprehensive inpatient rehab setting.  Physiatrist is providing close team supervision and 24 hour management of active medical problems listed below.  Physiatrist and rehab team continue to assess barriers to discharge/monitor patient progress toward functional and medical goals  Care Tool:  Bathing  Bathing activity did not occur: Refused Body parts bathed by patient: Chest,Abdomen,Front perineal area,Face,Left arm   Body parts bathed by helper: Right arm,Buttocks,Right upper leg,Left upper leg,Right lower leg,Left lower leg     Bathing assist Assist  Level: Total Assistance - Patient < 25%     Upper Body Dressing/Undressing Upper body dressing   What is the patient wearing?: Pull over shirt    Upper body assist Assist Level: Set up assist    Lower Body Dressing/Undressing Lower body dressing      What is the patient wearing?: Pants,Underwear/pull up     Lower body assist Assist for lower body dressing: Contact Guard/Touching assist     Toileting Toileting    Toileting assist Assist for toileting: Contact Guard/Touching assist     Transfers Chair/bed transfer  Transfers assist     Chair/bed transfer assist level: Supervision/Verbal cueing Chair/bed transfer assistive device:   Ambulation assist      Assist level: Contact Guard/Touching assist Assistive device: No Device Max distance: 100'   Walk 10 feet activity   Assist  Walk 10 feet activity did not occur: Safety/medical concerns  Assist level: Moderate Assistance - Patient - 50 - 74% Assistive device: No Device   Walk 50 feet activity   Assist Walk 50 feet with 2 turns activity did not occur: Safety/medical concerns  Assist level: Contact Guard/Touching assist Assistive device: No Device    Walk 150 feet activity   Assist Walk 150 feet activity did not occur: Safety/medical concerns  Assist level: 2 helpers Assistive device: No Device    Walk 10 feet on uneven surface  activity   Assist Walk 10 feet on uneven surfaces activity did not occur: Safety/medical concerns         Wheelchair     Assist Will patient use wheelchair at discharge?: No             Wheelchair 50 feet with 2 turns activity    Assist            Wheelchair 150 feet activity     Assist          Blood pressure (!) 159/99, pulse 89, temperature 98.3 F (36.8 C), resp. rate 18, height 5\' 10"  (1.778 m), weight (!) 180 kg, SpO2 100 %.    Medical Problem List and Plan: 1.  Debility secondary to  COVID-19 with course complicated by MI/STEMI as well as AKI. 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  Antithrombotics: -DVT/anticoagulation: Continue Lovenox 82.5 mg daily- d/c given bruising and excellent ambulation             -antiplatelet therapy: Aspirin 81 mg daily and Plavix 85 mg daily 3. Pain Management:  D/c oxycodone- not requiring.   Has Kpad as well  Controlled on 3/13 4. Mood: Provide emotional support             -antipsychotic agents: N/A 5. Neuropsych: This patient is capable of making decisions on his own behalf. 6. Skin/Wound Care: Routine skin checks  -local care to backside  - specialty mattress in place 7. Fluids/Electrolytes/Nutrition: Routine in and outs 8.  AKI.  Renal function improved.  CRRT discontinued.  Creatinine 1.59 on 3/11, labs ordered for tomorrow  Encourage fluids 9.  COVID-19.  Patient completed course of remdesivir as well as steroids.  Contact precautions discontinued 10.  Super obesity.  BMI 53.52 dietary follow-up 11.  Newly diagnosed uncontrolled diabetes mellitus.  Hemoglobin A1c 13.6.  Continue NovoLog 8 units 3 times daily, Levemir 37 units daily  Good control on 3/13 12.  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 tach.  Cardiology follow-up.  Cardiac catheterization 01/20/2021 no significant CAD.   13.  Hyperlipidemia: Lipitor 14. Uncontrolled hypertension:  Increased hydralazine to 50 TID  Norvasc 2.5 started on 3/13  LOS: 18 days A FACE TO FACE EVALUATION WAS PERFORMED  Calianne Larue 4/13 02/21/2021, 8:03 AM

## 2021-02-22 DIAGNOSIS — L89142 Pressure ulcer of left lower back, stage 2: Secondary | ICD-10-CM

## 2021-02-22 LAB — BASIC METABOLIC PANEL
Anion gap: 7 (ref 5–15)
BUN: 14 mg/dL (ref 6–20)
CO2: 24 mmol/L (ref 22–32)
Calcium: 8.9 mg/dL (ref 8.9–10.3)
Chloride: 110 mmol/L (ref 98–111)
Creatinine, Ser: 1.76 mg/dL — ABNORMAL HIGH (ref 0.61–1.24)
GFR, Estimated: 53 mL/min — ABNORMAL LOW (ref >60)
Glucose, Bld: 94 mg/dL (ref 70–99)
Potassium: 3.6 mmol/L (ref 3.5–5.1)
Sodium: 141 mmol/L (ref 135–145)

## 2021-02-22 LAB — GLUCOSE, CAPILLARY
Glucose-Capillary: 89 mg/dL (ref 70–99)
Glucose-Capillary: 101 mg/dL — ABNORMAL HIGH (ref 70–99)
Glucose-Capillary: 83 mg/dL (ref 70–99)
Glucose-Capillary: 90 mg/dL (ref 70–99)

## 2021-02-22 MED ORDER — COLLAGENASE 250 UNIT/GM EX OINT
TOPICAL_OINTMENT | Freq: Every day | CUTANEOUS | Status: DC
  Filled 2021-02-22: qty 30

## 2021-02-22 NOTE — Progress Notes (Signed)
PROGRESS NOTE   Subjective/Complaints: Overall doing well. Had questions about back wound and how it was doing  ROS: Patient denies fever, rash, sore throat, blurred vision, nausea, vomiting, diarrhea, cough, shortness of breath or chest pain, joint or back pain, headache, or mood change.    Objective:   No results found. No results for input(s): WBC, HGB, HCT, PLT in the last 72 hours. Recent Labs    02/22/21 0441  NA 141  K 3.6  CL 110  CO2 24  GLUCOSE 94  BUN 14  CREATININE 1.76*  CALCIUM 8.9    Intake/Output Summary (Last 24 hours) at 02/22/2021 0928 Last data filed at 02/22/2021 0035 Gross per 24 hour  Intake 776 ml  Output 1325 ml  Net -549 ml        Physical Exam: Vital Signs Blood pressure (!) 148/97, pulse 93, temperature 98.3 F (36.8 C), temperature source Oral, resp. rate 18, height 5\' 10"  (1.778 m), weight (!) 179.6 kg, SpO2 97 %. Constitutional: No distress . Vital signs reviewed. HEENT: EOMI, oral membranes moist Neck: supple Cardiovascular: RRR without murmur. No JVD    Respiratory/Chest: CTA Bilaterally without wheezes or rales. Normal effort    GI/Abdomen: BS +, non-tender, non-distended Ext: no clubbing, cyanosis, or edema Psych: pleasant and cooperative Skin:    Musc: No edema in extremities.  No tenderness in extremities. Neuro: Alert Motor: 4+/5 throughout, except for left proximal upper extremity slightly decreased, unchanged  Assessment/Plan: 1. Functional deficits which require 3+ hours per day of interdisciplinary therapy in a comprehensive inpatient rehab setting.  Physiatrist is providing close team supervision and 24 hour management of active medical problems listed below.  Physiatrist and rehab team continue to assess barriers to discharge/monitor patient progress toward functional and medical goals  Care Tool:  Bathing  Bathing activity did not occur: Refused Body  parts bathed by patient: Chest,Abdomen,Front perineal area,Face,Left arm   Body parts bathed by helper: Right arm,Buttocks,Right upper leg,Left upper leg,Right lower leg,Left lower leg     Bathing assist Assist Level: Total Assistance - Patient < 25%     Upper Body Dressing/Undressing Upper body dressing   What is the patient wearing?: Pull over shirt    Upper body assist Assist Level: Set up assist    Lower Body Dressing/Undressing Lower body dressing      What is the patient wearing?: Pants,Underwear/pull up     Lower body assist Assist for lower body dressing: Contact Guard/Touching assist     Toileting Toileting    Toileting assist Assist for toileting: Contact Guard/Touching assist     Transfers Chair/bed transfer  Transfers assist     Chair/bed transfer assist level: Supervision/Verbal cueing Chair/bed transfer assistive device:   Ambulation assist      Assist level: Contact Guard/Touching assist Assistive device: No Device Max distance: 100'   Walk 10 feet activity   Assist  Walk 10 feet activity did not occur: Safety/medical concerns  Assist level: Moderate Assistance - Patient - 50 - 74% Assistive device: No Device   Walk 50 feet activity   Assist Walk 50 feet with 2 turns activity did not occur:  Safety/medical concerns  Assist level: Contact Guard/Touching assist Assistive device: No Device    Walk 150 feet activity   Assist Walk 150 feet activity did not occur: Safety/medical concerns  Assist level: 2 helpers Assistive device: No Device    Walk 10 feet on uneven surface  activity   Assist Walk 10 feet on uneven surfaces activity did not occur: Safety/medical concerns         Wheelchair     Assist Will patient use wheelchair at discharge?: No             Wheelchair 50 feet with 2 turns activity    Assist            Wheelchair 150 feet activity     Assist           Blood pressure (!) 148/97, pulse 93, temperature 98.3 F (36.8 C), temperature source Oral, resp. rate 18, height 5\' 10"  (1.778 m), weight (!) 179.6 kg, SpO2 97 %.    Medical Problem List and Plan: 1.  Debility secondary to COVID-19 with course complicated by MI/STEMI as well as AKI. 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  Antithrombotics: -DVT/anticoagulation: Continue Lovenox 82.5 mg daily- d/c given bruising and excellent ambulation             -antiplatelet therapy: Aspirin 81 mg daily and Plavix 85 mg daily 3. Pain Management:  D/c oxycodone- not requiring.   Has Kpad as well  Controlled on 3/18 4. Mood: Provide emotional support             -antipsychotic agents: N/A 5. Neuropsych: This patient is capable of making decisions on his own behalf. 6. Skin/Wound Care: Routine skin checks  -3/14 will apply santyl to back wound daily, new orders placed  - specialty mattress in place 7. Fluids/Electrolytes/Nutrition: Routine in and outs 8.  AKI.  Renal function improved.  CRRT discontinued.  Creatinine 1.76 3/14==new baseline  Encourage fluids 9.  COVID-19.  Patient completed course of remdesivir as well as steroids.  Contact precautions discontinued 10.  Super obesity.  BMI 53.52 dietary follow-up 11.  Newly diagnosed uncontrolled diabetes mellitus.  Hemoglobin A1c 13.6.  Continue NovoLog 8 units 3 times daily, Levemir 37 units daily  Good control on 3/14 12.  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 tach.  Cardiology follow-up.  Cardiac catheterization 01/20/2021 no significant CAD.   13.  Hyperlipidemia: Lipitor 14. Uncontrolled hypertension:  Increased hydralazine to 50 TID  Norvasc 2.5 started on 3/13---observe response today  -still occasional dizzy spells when up  LOS: 19 days A FACE TO FACE EVALUATION WAS  PERFORMED  03-22-1990 02/22/2021, 9:28 AM

## 2021-02-22 NOTE — Progress Notes (Signed)
Physical Therapy Session Note  Patient Details  Name: Erik [name] MRN: [id number] Date of Birth: 09-Oct-1991  Today's Date: 02/22/2021 PT Individual Time: 5997-8776 PT Individual Time Calculation (min): 25 min   Short Term Goals: Week 2:  PT Short Term Goal 1 (Week 2): Pt will perform bed mobility consistently with supervision. PT Short Term Goal 1 - Progress (Week 2): Met PT Short Term Goal 2 (Week 2): Pt will perform sit to stand transfers consistently with CGA. PT Short Term Goal 2 - Progress (Week 2): Met PT Short Term Goal 3 (Week 2): Pt will perform bed to chair transfer consistently with CGA. PT Short Term Goal 3 - Progress (Week 2): Met PT Short Term Goal 4 (Week 2): Pt will ambulate 150' with minA +1 and LRAD. PT Short Term Goal 4 - Progress (Week 2): Met  Skilled Therapeutic Interventions/Progress Updates:    Pt received sitting upright in w/c, agreeable to therapy and no reports of pain. Discussed DC planning and patient feels excited for upcoming discharge home where he will have family assist. W/c transport for time management to day room gym. Completed TUG x3 trials with CGA and RW: 22sec, 19 sec, 18 sec each trial. Then ambulated back to his room, ~172f, with close supervision and RW. Stand>sit with supervision and RW to sizewize bed and able to remove his shoes with supervision. Also performed sit<>stand without RW with supervision to remove athletic shorts. Sit>supine mod I with use of bed features - remained supine in bed with needs in reach at end of session.  Therapy Documentation Precautions:  Precautions Precautions: Fall Restrictions Weight Bearing Restrictions: No  Therapy/Group: Individual Therapy  Aerika Groll P Saraiyah Hemminger PT 02/22/2021, 7:01 AM

## 2021-02-22 NOTE — Progress Notes (Signed)
Occupational Therapy Session Note  Patient Details  Name: Erik [name] MRN: [id number] Date of Birth: 1991-02-23  Today's Date: 02/22/2021 OT Individual Time: 1002-1100 OT Individual Time Calculation (min): 58 min   Short Term Goals: Week 3:  OT Short Term Goal 1 (Week 3): LTG=STG 2/2 ELOS  Skilled Therapeutic Interventions/Progress Updates:    Pt greeted seated in wc with mother present for family education. OT discussed pt progress and use of bariatric tub transfer bench for tub shower transfer. OT pulled up on internet different purchase options including [employer] and [employer]. Discussed importance of making sure weight limit up to 500 lbs. Watched video of tub transfer bench techinuqe with mom to educate on transfer. Pt ambulated throughout session with RW and close supervision. Bathing/dressing completed with overall close supervision and min A for getting shoes tied today. OT issued UE home exercise program using level 1 theraband. Focus on L shoulder strengthening. Pt completed 5 reps of all 7 exercises. Pt returned to bed at end of session and left semi-reclined with needs met and mother present.   Therapy Documentation Precautions:  Precautions Precautions: Fall Restrictions Weight Bearing Restrictions: No Pain: Denies pain  Therapy/Group: Individual Therapy  Valma Cava 02/22/2021, 11:05 AM

## 2021-02-22 NOTE — Progress Notes (Signed)
Physical Therapy Session Note  Patient Details  Name: Erik [name] MRN: [id number] Date of Birth: Jul 13, 1991  Today's Date: 02/22/2021 PT Individual Time: 0905-1000 and 1310-1355 PT Individual Time Calculation (min): 55 min and 45 min  Short Term Goals: Week 3:     Skilled Therapeutic Interventions/Progress Updates:     1st Session: Pt received supine in bed and agrees to therapy. No complaint of pain. Mother present for family education. Pt performs supine to sit from flat bed independently. Stand pivot transfer to [facility] with RW and verbal cues for positioning. WC transport to gym for time management. Pt performs car transfer with verbal cues on sequencing and positioning. Pt ambulates x100' with RW and supervision with cues for upright gaze, and x100' without AD with CGA. Pt takes seated rest breaks throughout session. PT provides education to pt and mother regarding importance of energy conservation and monitoring level of exertion. Pt left seated in WC with all needs within reach.  2nd Session: Pt received supine in bed and agrees to therapy. No complaint of pain. Pt performs supine to sit with bed features and mod(I). Stand step transfer from bed to [facility] with minA due to catching foot on side of bed and requiring assistance to prevent fall. WC transport to gym for time management. Pt performs overhand crawl onto mat table to position self in high kneeling, with upper extremity support on platform. Pt then performs "basketball" shots to encourage dynamic balance, functional use of bilateral upper extremities, and strengthening in hips and core. Pt performs with supervision and cues for body mechanics, but does not require physical assistance to complete. Pt takes prone rest breaks during activity. Pt then performs quadruped exercise, reaching alternating upper extremities forward x10. Pt then performs kicks with alternating lower extremities x10. In prone, pt performs x10 press ups with PT providing  external support at L elbow and shoulder. Pt performs prone Y's and T's, with tactile cueing for correct performance. Prone to supine to sit independent. Stand step transfer back to [facility] with CGA. Left seated with all needs within reach.  Therapy Documentation Precautions:  Precautions Precautions: Fall Restrictions Weight Bearing Restrictions: No   Therapy/Group: Individual Therapy  Beau Fanny, PT, DPT 02/22/2021, 2:39 PM

## 2021-02-23 LAB — GLUCOSE, CAPILLARY
Glucose-Capillary: 84 mg/dL (ref 70–99)
Glucose-Capillary: 119 mg/dL — ABNORMAL HIGH (ref 70–99)
Glucose-Capillary: 84 mg/dL (ref 70–99)
Glucose-Capillary: 86 mg/dL (ref 70–99)

## 2021-02-23 NOTE — Progress Notes (Signed)
Occupational Therapy Session Note  Patient Details  Name: Erik [name] MRN: [id number] Date of Birth: 04/13/1991  Today's Date: 02/23/2021 Session 1 OT Individual Time: 4562-5638 OT Individual Time Calculation (min): 58 min   Session 2 OT Individual Time: 9373-4287 OT Individual Time Calculation (min): 30 min    Short Term Goals: Week 3:  OT Short Term Goal 1 (Week 3): LTG=STG 2/2 ELOS  Skilled Therapeutic Interventions/Progress Updates:    Session 1 Pt greeted semi-reclined in bed and agreeable to OT treatment session. Pt completed bed mobility with supervision, donned pants, socks, and shoes at EOB with set-up A. Pt then ambulated to the sink with RW and supervision and stood to brush teeth. Without rest break, pt ambulated to therapy day room with supervision. Focus on L shoulder strengthening activities. Using 1 lb free weight, for seated lateral raise, and power punch. Pt needed guided A at elbow to complete exercises. Pt brought into supine with upper back supported on pillows. 1 lb dowel rod with chest press and straight arm raise, 3 sets of 10. Pt brought into sidelying and used UE ranger to work on shoulder forward flexion. Pt ambulated back to room w/ RW and supervision. Pt left seated in wc with call bell in reach and needs met.   Session 2 Pt greeted semi-reclined in bed and agreeable to OT treatment session. Pt completed bed mobility supervision and donned shoes with set-up. Worked on standing balance/endurance and functional use of L UE with Wii bowling activity. Pt able to bowl with L hand and stand unassisted for entire 10 frames. Pt returned to room and ambulated back to bed with RW and supervision. Pt left semi-reclined in bed with needs met.    Therapy Documentation Precautions:  Precautions Precautions: Fall Restrictions Weight Bearing Restrictions: No Pain: Pain Assessment Pain Scale: 0-10 Pain Score: 0-No pain  Therapy/Group: Individual Therapy  Valma Cava 02/23/2021, 3:33 PM

## 2021-02-23 NOTE — Progress Notes (Signed)
Patient ID: Erik [name], male   DOB: [birth year], 30 y.o.   MRN: [id number]  SW met with pt in room to provide updates on gains made, and d/c remains 3/18. Pt reminded about [employer] medication assistance program, waiting for charity DME to be delivered, and recommendation for outpatient therapy for PT/OT. Pt aware SW to follow-up with his mother.   Loralee Pacas, MSW, [place] Office: [phone] Cell: [phone] Fax: [phone]

## 2021-02-23 NOTE — Patient Care Conference (Signed)
Inpatient RehabilitationTeam Conference and Plan of Care Update Date: 02/23/2021   Time: 10:44 AM    Patient Name: Erik [name]      Medical Record Number: [id number]  Date of Birth: July 23, 1991 Sex: Male         Room/Bed: 4W01C/4W01C-01 Payor Info: Payor: /    Admit Date/Time:  02/03/2021 12:52 PM  Primary Diagnosis:  Debility  Hospital Problems: Principal Problem:   Debility Active Problems:   Essential hypertension   Diabetes mellitus, new onset (HCC)   AKI (acute kidney injury) [facility])    Expected Discharge Date: Expected Discharge Date: 02/26/21  Team Members Present: Physician leading conference: Dr. Faith Rogue Care Coodinator Present: Cecile Sheerer, LCSWA;Stacey Marlyne Beards, RN, BSN, CRRN Nurse Present: Other (comment) Lupita Dawn, RN) PT Present: Malachi Pro, PT OT Present: Kearney Hard, OT SLP Present: Feliberto Gottron, SLP PPS Coordinator present : Fae Pippin, SLP     Current Status/Progress Goal Weekly Team Focus  Bowel/Bladder   Pt is continent x2.  Pt will remain continent x2.  Assess q shift and prn.   Swallow/Nutrition/ Hydration             ADL's   Supervision  Supervision/mod I- goals upgraded  dc planning, UB ther-ex, self-care retraining, pt/family education   Mobility   mod(I) bed mobility, supervision transfers, gait 280' with RW minA, 175' without AD and CGA  supervision  ambulation, balance, DC prep   Communication             Safety/Cognition/ Behavioral Observations            Pain   No c/o pain.  Pain will remain <3.  Assess q shift and prn.   Skin   MASD to buttocks and mid back skin tear.  Skin will continue to heal and remain infection free.  Assess q shift and prn.     Discharge Planning:  Pt is uninsured. D/c to home with 24/7 care. Mother is a dialysis pt- TTS 1st shift. Pt mother reports pt will have assistance from various family members that can stay with pt if needed. fam edu completed on 3/14 9am-11am with his  mother.   Team Discussion: Adjusting medications, working on wounds. Continent B/B, urinates a lot in urinal. Bladder scan to check for retention. Area on back is improving and looking better. Uninsured patient, please provide HEP if he can't come up with co-pays.  Patient on target to meet rehab goals: Micah Flesher over all equipment with the mother. On track for discharge. Continue to work on balance.  *See Care Plan and progress notes for long and short-term goals.   Revisions to Treatment Plan:  MD is adjusting some medications.  Teaching Needs: Family education, medication management, pain management, skin/wound care, transfer training, gait training, balance training, endurance training, stair training.  Current Barriers to Discharge: Inaccessible home environment, Decreased caregiver support, Home enviroment access/layout, Wound care, Lack of/limited family support, Weight, Medication compliance and Behavior  Possible Resolutions to Barriers: Continue current medications, provide emotional support.     Medical Summary Current Status: addressing bp. started santyl for back wound. improving strength LUE  Barriers to Discharge: Medical stability   Possible Resolutions to Barriers/Weekly Focus: ongoing skin care mgt. daily lab and pt data assessment   Continued Need for Acute Rehabilitation Level of Care: The patient requires daily medical management by a physician with specialized training in physical medicine and rehabilitation for the following reasons: Direction of a multidisciplinary physical rehabilitation program to maximize  functional independence : Yes Medical management of patient stability for increased activity during participation in an intensive rehabilitation regime.: Yes Analysis of laboratory values and/or radiology reports with any subsequent need for medication adjustment and/or medical intervention. : Yes   I attest that I was present, lead the team conference, and  concur with the assessment and plan of the team.   Tennis Must 02/23/2021, 4:00 PM

## 2021-02-23 NOTE — Plan of Care (Signed)
  Problem: RH Balance Goal: LTG: Patient will maintain dynamic sitting balance (OT) Description: LTG:  Patient will maintain dynamic sitting balance with assistance during activities of daily living (OT) Flowsheets (Taken 02/23/2021 0956) LTG: Pt will maintain dynamic sitting balance during ADLs with: Independent Note: Goal upgraded 3/15 due to progress - ESD   Problem: RH Grooming Goal: LTG Patient will perform grooming w/assist,cues/equip (OT) Description: LTG: Patient will perform grooming with assist, with/without cues using equipment (OT) Flowsheets (Taken 02/23/2021 0956) LTG: Pt will perform grooming with assistance level of: Independent Note: Goal upgraded 3/15 due to progress - ESD   Problem: RH Bathing Goal: LTG Patient will bathe all body parts with assist levels (OT) Description: LTG: Patient will bathe all body parts with assist levels (OT) Flowsheets (Taken 02/23/2021 0956) LTG: Pt will perform bathing with assistance level/cueing: Supervision/Verbal cueing Note: Goal upgraded 3/15 due to progress - ESD   Problem: RH Dressing Goal: LTG Patient will perform upper body dressing (OT) Description: LTG Patient will perform upper body dressing with assist, with/without cues (OT). Flowsheets (Taken 02/23/2021 0956) LTG: Pt will perform upper body dressing with assistance level of: Independent Note: Goal upgraded 3/15 due to progress - ESD Goal: LTG Patient will perform lower body dressing w/assist (OT) Description: LTG: Patient will perform lower body dressing with assist, with/without cues in positioning using equipment (OT) Flowsheets (Taken 02/23/2021 0956) LTG: Pt will perform lower body dressing with assistance level of: Independent with assistive device Note: Goal upgraded 3/15 due to progress - ESD   Problem: RH Toileting Goal: LTG Patient will perform toileting task (3/3 steps) with assistance level (OT) Description: LTG: Patient will perform toileting task (3/3 steps)  with assistance level (OT)  Flowsheets (Taken 02/23/2021 0956) LTG: Pt will perform toileting task (3/3 steps) with assistance level: Supervision/Verbal cueing Note: Goal upgraded 3/15 due to progress - ESD   Problem: RH Tub/Shower Transfers Goal: LTG Patient will perform tub/shower transfers w/assist (OT) Description: LTG: Patient will perform tub/shower transfers with assist, with/without cues using equipment (OT) Flowsheets (Taken 02/23/2021 0956) LTG: Pt will perform tub/shower stall transfers with assistance level of: Supervision/Verbal cueing Note: Goal upgraded 3/15 due to progress - ESD

## 2021-02-23 NOTE — Progress Notes (Signed)
Physical Therapy Session Note  Patient Details  Name: Erik [name] MRN: [id number] Date of Birth: July 20, 1991  Today's Date: 02/23/2021 PT Individual Time: 1245-1345 PT Individual Time Calculation (min): 60 min   Short Term Goals: Week 3:   STG=LTG due to ELOS.  Skilled Therapeutic Interventions/Progress Updates:     Patient in w/c in the room upon PT arrival. Patient alert and agreeable to PT session. Patient denied pain during session. Patient reported increased fatigue this afternoon form morning therapy sessions.   Therapeutic Activity: Transfers: Patient performed sit to/from stand x7 with supervision. Provided verbal cues for reaching back to sit using RW x1 and forward weight shift without RW.  Neuromuscular Re-ed: Patient performed the Berg Balance Scale: Patient demonstrates increased fall risk as noted by score of 34/56 on Berg Balance Scale.  (<36= high risk for falls, close to 100%; 37-45 significant >80%; 46-51 moderate >50%; 52-55 lower >25%) Educated patient on results and interpretation following assessment. Recommended use of RW at d/c due to increased fall risk, patient in agreement. -seated heel/toe lifts x10, educated on patient performing every hour in sitting -standing heel raises x10, patient reported feeling light headed during  Sitting BP: 121/88, HR 99 Standing BP: 112/67, HR 112  Patient required increased time and rest breaks due to fatigue and feeling "light headed" in standing during session. Educated on appropriate versus inappropriate BP values and provided handout with values and education for how to respond to abnormal values. Upon returning to patient's room with handout, he reported increased "light headedness" in sitting. Returned patient to bed with CGA for stand pivot without AD and supervision for sit to supine in a flat bed without use of bed rails. Elevated patient's feet in the bed for improved BP and symptoms. BP 123/81, HR 96. RN made aware of  orthostasis in standing and symptoms in sitting during session.   Patient in w/c in the room at end of session with breaks locked and all needs within reach.    Therapy Documentation Precautions:  Precautions Precautions: Fall Restrictions Weight Bearing Restrictions: No   Therapy/Group: Individual Therapy  Charls Custer L Thao Bauza PT, DPT  02/23/2021, 4:04 PM

## 2021-02-23 NOTE — Progress Notes (Signed)
Physical Therapy Session Note  Patient Details  Name: Erik [name] MRN: [id number] Date of Birth: 1991-08-27  Today's Date: 02/23/2021 PT Individual Time: 6387-5643 PT Individual Time Calculation (min): 43 min   Short Term Goals: Week 3:     Skilled Therapeutic Interventions/Progress Updates:     Pt received seated in [facility] and agrees to therapy. No complaint of pain. WC transport to gym for time management and energy conservation. Pt performs 1x10 bilateral LAQs with bodyweight and 1x10 LAQs with 4lb ankle weights. Pt performs multiple reps of sit to stand during session with supervision and cues for body mechanics and hand placement. Pt ambulates 3x200' without AD and with CGA. PT provides cues for upright gaze to improve posture and balance, and importance of planning ahead due to pt tending to have slight losses of balance with turns. Pt demos improved balance following cues. Pt takes seated rest breaks between bout of ambulation. Pt then performs standing marches with 4lb weights x15 with each foot. Performed for strengthening and balance challenge. Pt then ambulates 2x100' with ankle weights. Pt then ambulates x240' back to room with CGA. Left seated in WC with all needs within reach.  Therapy Documentation Precautions:  Precautions Precautions: Fall Restrictions Weight Bearing Restrictions: No    Therapy/Group: Individual Therapy  Beau Fanny, PT, DPT 02/23/2021, 12:31 PM

## 2021-02-24 LAB — GLUCOSE, CAPILLARY
Glucose-Capillary: 90 mg/dL (ref 70–99)
Glucose-Capillary: 106 mg/dL — ABNORMAL HIGH (ref 70–99)
Glucose-Capillary: 97 mg/dL (ref 70–99)
Glucose-Capillary: 92 mg/dL (ref 70–99)

## 2021-02-24 MED ORDER — AMLODIPINE BESYLATE 5 MG PO TABS
5.0000 mg | ORAL_TABLET | Freq: Every day | ORAL | Status: DC
  Administered 2021-02-25: 5 mg via ORAL
  Administered 2021-02-26: 10 mg via ORAL
  Filled 2021-02-24 (×2): qty 1

## 2021-02-24 NOTE — Progress Notes (Signed)
Physical Therapy Session Note  Patient Details  Name: Erik [name] MRN: [id number] Date of Birth: 10-11-1991  Today's Date: 02/24/2021 PT Individual Time: 1130-1159 + 1300-1325 PT Individual Time Calculation (min): 29 min + 25 min  Short Term Goals: Week 2:  PT Short Term Goal 1 (Week 2): Pt will perform bed mobility consistently with supervision. PT Short Term Goal 1 - Progress (Week 2): Met PT Short Term Goal 2 (Week 2): Pt will perform sit to stand transfers consistently with CGA. PT Short Term Goal 2 - Progress (Week 2): Met PT Short Term Goal 3 (Week 2): Pt will perform bed to chair transfer consistently with CGA. PT Short Term Goal 3 - Progress (Week 2): Met PT Short Term Goal 4 (Week 2): Pt will ambulate 150' with minA +1 and LRAD. PT Short Term Goal 4 - Progress (Week 2): Met  Skilled Therapeutic Interventions/Progress Updates:     1st session: Pt received supine in bed, awake and agreeable to therapy - no reports of pain. Supine<>sit mod I with bed features. Donned tennis shoes while seated EOB with setupA, completed via figure-4 technique. Pt then reporting need to void - requesting urinal. While standing with RW support and distant supervision, pt continent of bladder, charted in flowsheets. Pt's bariatric RW delivered to his room - adjusted height of RW to fit patient appropriately. He then ambulated with close supervision and RW from his room to main rehab gym, >242f. x1 instance of L toe drag with self corrected LOB - cues for increasing step height to avoid L toe drag. Pt with mild shortness of breath after gait trials, SPO2 reading 98% with HR 110. Pt then performed chop/lift patterns in unsupported standing with 7# dumbbell, 1x10 each direction, needing seated rest break b/w sets. Stand<>pivot transfer with supervision and RW to w/c and pt [place]. Pt agreeable to remain sitting in w/c for lunch - seated in w/c and needs in reach at  end of session.  2nd session: Pt received sitting in w/c, agreeable to therapy. Sit<>stand from w/c height with supervision to RW - ambulated from his room to day room gym, ~167f with close supervision and RW. Seated rest break needed for recovery. Gait training 3x1531fateral stepping while unsupported along hallway, needing a heavy minA for balance with several small LOB laterally. Cues for increasing step height, steadying, and safety awareness. Pt needing seated rest breaks b/w efforts and reports increased difficulty with lateral stepping compared to forward walking. Pt returned back to his room in w/c with totalA for energy conservation and he remained seated in w/c with needs in reach.  Therapy Documentation Precautions:  Precautions Precautions: Fall Restrictions Weight Bearing Restrictions: No  Therapy/Group: Individual Therapy  [name] [name]  PT 02/24/2021, 12:06 PM

## 2021-02-24 NOTE — Progress Notes (Signed)
Nutrition Follow-up  DOCUMENTATION CODES:   Morbid obesity  INTERVENTION:   - Continue Ensure Max po daily, each supplement provides 150 kcal and 30 grams of protein (chocolate flavor)  -Continuedouble protein portions TID with meals  - Continue MVI with minerals daily  NUTRITION DIAGNOSIS:   Increased nutrient needs related to catabolic illness (recent COVID-19 infection) as evidenced by estimated needs.  Ongoing, being addressed via supplements  GOAL:   Patient will meet greater than or equal to 90% of their needs  Progressing  MONITOR:   PO intake,Supplement acceptance,Labs,Weight trends,I & O's  REASON FOR ASSESSMENT:   Malnutrition Screening Tool    ASSESSMENT:   30 year old male with unremarkable PMH. Presented on 01/12/21 with progressive weakness, poor PO intake, lethargy x 2 weeks. Pt admitted with COVID-19. Pt did initially require intubation for airway protection. Renal service was consulted and CRRT initiated for AKI which has now resolved. Pt admitted to CIR on 2/23.  Noted target d/c date of 3/18.  Pt continues to eat well with 100% meal completions documented. Pt accepting ~90% of Ensure Max supplements. Will continue with these and with double protein portions with meals. Will also continue with daily MVI.  Meal Completion: 100%  Medications reviewed and include: pepcid, SSI with meals and at bedtime, novolog 8 units TID with meals, levemir 37 units daily, MVI with minerals daily, Ensure Max daily  Labs reviewed: creatinine 1.76, hemoglobin 9.4 CBG's: 84-119 x 24 hours  UOP: 2250 ml x 24 hours  Diet Order:   Diet Order            Diet Carb Modified Fluid consistency: Thin; Room service appropriate? Yes  Diet effective now                 EDUCATION NEEDS:   No education needs have been identified at this time  Skin:  Skin Assessment: Skin Integrity Issues: Other: skin tear to vertebral column, MASD to buttocks  Last BM:  02/24/21  medium type 5  Height:   Ht Readings from Last 1 Encounters:  02/03/21 5\' 10"  (1.778 m)    Weight:   Wt Readings from Last 1 Encounters:  02/24/21 (!) 180 kg    BMI:  Body mass index is 56.94 kg/m.  Estimated Nutritional Needs:   Kcal:  2700-2900  Protein:  150-170 grams  Fluid:  >/= 2.5 L    02/26/21, MS, RD, LDN Inpatient Clinical Dietitian Please see [facility] for contact information.

## 2021-02-24 NOTE — Progress Notes (Signed)
Physical Therapy Session Note  Patient Details  Name: Erik [name] MRN: [id number] Date of Birth: [birth year]  Today's Date: 02/24/2021 PT Individual Time: 1402-1500 PT Individual Time Calculation (min): 58 min   Short Term Goals: Week 3:     Skilled Therapeutic Interventions/Progress Updates:     Pt received seated in [facility] and agrees to therapy. No complaint of pain. WC transport to gym for time management. Pt participates in Wii bowling game for NMR for standing balance and activity tolerance. Pt uses R arm for high amplitude, dynamic movements, without upper extremity support. PT provides close supervision for safety but not physical assistance required for balance. Pt able to stand up for 10 frame game without rest break. Pt ambulates x160' with new personal RW, which PT sizes for pt, and PT provides verbal cues for upright gaze to improve posture and balance. Following seated rest break, pt simulate home environment (pt lives in 3rd floor apartment), but navigating on/off elevator with RW. PT provides cues for safety and to manage elevators effectively. Pt able to complete task without seated rest break. Pt then performs balance/gait training without AD. PT sets up "obstacle course" for pt to perform bilateral sidestepping. Pt completes with CGA. PT provides cues for wider BOS for increased stability due to pt's narrow stance. Pt then performs sidestepping holding onto wall rail with level 3 theraband around distal thighs for engagement of glute meds. PT provides education on exercises for hip abductor strengthening and body mechanics to target abductors. Pt left seated in WC with all needs within reach.  Therapy Documentation Precautions:  Precautions Precautions: Fall Restrictions Weight Bearing Restrictions: No   Therapy/Group: Individual Therapy  Beau Fanny, PT, DPT 02/24/2021, 3:58 PM

## 2021-02-24 NOTE — Discharge Summary (Signed)
Physician Discharge Summary  Patient ID: Erik [name] MRN: [id number] DOB/AGE: 1991-01-10 29 y.o.  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 Tach Hyperlipidemia Critical illness myopathy Back wound  Discharged Condition: Stable  Significant Diagnostic Studies: No results found.  Labs:  Basic Metabolic Panel: Recent Labs  Lab 02/19/21 0503 02/22/21 0441  NA 140 141  K 3.9 3.6  CL 108 110  CO2 24 24  GLUCOSE 86 94  BUN 17 14  CREATININE 1.59* 1.76*  CALCIUM 9.0 8.9    CBC: No results for input(s): WBC, NEUTROABS, HGB, HCT, MCV, PLT in the last 168 hours.  CBG: Recent Labs  Lab 02/23/21 2101 02/24/21 0602 02/24/21 1126 02/24/21 1627 02/24/21 2104  GLUCAP 86 90 106* 97 92    Brief HPI:   Erik [name] is a 30 y.o. right-handed male with unremarkable past medical history except obesity with BMI 49.64 on no prescription medications and has not followed up with a medical provider in some time.  Patient lives with his parent.  Multilevel home bed and bath upstairs.  Independent prior to admission.  Presented 01/12/2021 with progressive weakness poor intake lethargy x2 weeks.  Denied any fever.  Patient with bouts of nausea and vomiting.  Chest x-ray showed cardiomegaly.  No pneumothorax.  Bilateral pulmonary infiltrate/edema admission chemistries sodium 142 potassium 5.7 glucose 802 BUN 74 creatinine 3.06 total bilirubin 1.8 WBC 17,300 hemoglobin 15.8 lactic acid 3.3 blood cultures no growth to date hemoglobin A1c 13.6 SARS coronavirus positive.  Patient was placed on intravenous remdesivir as well as Decadron.  Airborne contact precautions implemented and discontinued 02/01/2021 and patient  currently maintained on room air.  Renal ultrasound showed no obstructive uropathy.  Patient did initially require intubation for airway protection.  Renal service consulted and CRRT initiated.  Hospital course further complicated by increasing chest pain on 01/19/2021.  EKG showing MI/STEMI as well as episode of sustained ventricular tachycardia taken to the cardiac Cath Lab on 01/20/2021 that showed no significant CAD suspect myocarditis continue to be followed by Dr. Cristal Deer End.  Echocardiogram with ejection fraction of 60 to 65%.  Patient was placed on metoprolol as well as low-dose aspirin and Plavix.  Lovenox for DVT prophylaxis.  Insulin initiate as well as follow-up diabetic coordinator for new findings diabetes mellitus.  AKI resolved CRRT discontinued latest creatinine 1.69.  Therapy evaluations completed due to patient decreased functional ability limited endurance was admitted for a comprehensive rehab program.   Hospital Course: Erik [name] was admitted to rehab 02/03/2021 for inpatient therapies to consist of PT, ST and OT at least three hours five days a week. Past admission physiatrist, therapy team and rehab RN have worked together to provide customized collaborative inpatient rehab.  Pertaining to patient's debility/critical illness myopathy secondary to COVID-19 complicated by myocardial infarction with STEMI.  He was participating with therapies.  Subcutaneous Lovenox for DVT prophylaxis.  AKI much improved latest creatinine 1.76 CRRT had since been discontinued.  His hospital course as noted complicated by COVID-19 he had completed course of remdesivir as well as steroids contact precautions discontinued he remained afebrile.  Super obesity BMI 53.52 dietary follow-up.  Newly diagnosed uncontrolled diabetes mellitus hemoglobin A1c of 13.6 insulin therapy diabetic teaching.  He would  need outpatient follow-up.  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 tach  cardiology follow Dr. Cristal Deer End no increasing shortness of breath noted cardiac catheterization showed no significant CAD.  Patient remained on low-dose aspirin and Plavix.  Hyperlipidemia Lipitor ongoing.  Blood pressure monitored on Norvasc's as well as hydralazine.  Patient did have a back wound skin care as advised applied slightly moistened and folded 2 x 2 and placed snugly on back wound cover with dry 2 x 2 and tape secured daily with Santyl ointment   Blood pressures were monitored on TID basis and soft and monitored  Diabetes has been monitored with ac/hs CBG checks and SSI was use prn for tighter BS control.    Rehab course: During patient's stay in rehab weekly team conferences were held to monitor patient's progress, set goals and discuss barriers to discharge. At admission, patient required max assist supine to sit moderate assist sit to supine max assist squat pivot transfers.  Minimal assist upper body bathing max is lower body bathing minimal assist upper body dressing max is lower body dressing  Physical exam.  Blood pressure 140/89 pulse 110 temperature 97.9 respirations 18 oxygen saturation 98% room air Constitutional.  No acute distress HEENT Head.  Normocephalic and atraumatic Eyes.  Pupils round and reactive to light no discharge without nystagmus Neck.  Supple nontender no JVD without thyromegaly Cardiac regular rate rhythm no extra sounds or murmur heard Abdomen.  Soft nontender positive bowel sounds obese Respiratory effort normal no respiratory distress without wheeze Neurologic.  Alert oriented follows commands motor strength 4/5 throughout Skin.  Back wound with dressing in place   He/She  has had improvement in activity tolerance, balance, postural control as well as ability to compensate for deficits. He/She has had improvement in functional use RUE/LUE  and RLE/LLE as well as improvement in awareness.  Patient perform sit to stand supervision.  Patient  ambulates 200 feet x 3 without assistive device contact-guard assist.  Demonstrates improved balance following some cues.  He can gather his belongings for activities day living and homemaking.  Full family teaching completed plan discharge to home       Disposition: Discharged to home    Diet: Diabetic diet  Special Instructions: No driving smoking or alcohol  Santyl ointment daily applied to a slightly moistened and folded 2 x 2 and placed snugly on back wound cover with dry 2 x 2 and tape securely  Medications at discharge 1.  Tylenol as needed 2.  Albuterol inhaler 1 to 2 puffs every 6 hours as needed wheezing 3.  Norvasc 2.5 mg daily 4.  Aspirin 81 mg p.o. daily 5.  Lipitor 80 mg p.o. daily 6.  Plavix 75 mg p.o. daily 7.  Pepcid 20 mg p.o. daily 8.  Hydralazine 50 mg every 8 hours 9.  NovoLog 8 units 3 times daily with meals 10.  Levemir 37 units daily 11.  Lopressor 100 mg p.o. twice daily 12.  Multivitamin daily  30-35 minutes were spent completing discharge summary and discharge planning  Discharge Instructions    Ambulatory referral to Occupational Therapy   Complete by: As directed    Eval and treat   Ambulatory referral to Physical Medicine Rehab   Complete by: As directed    Follow-up transition of care critical illness myopathy   Ambulatory referral to Physical Therapy   Complete by: As directed    Eval and treat       Follow-up Information  Ranelle Oyster, MD Follow up.   Specialty: Physical Medicine and Rehabilitation Why: Office to call for appointment Contact information: [address] [place] [address] [phone]        Yvonne Kendall, MD Follow up.   Specialty: Cardiology Why: Call for appointment Contact information: [address] [place] [address] [phone]        Mosetta Pigeon, MD Follow up.   Specialty: Nephrology Why: Call for appointment Contact information: [phone] [place] [address] [phone]               Signed: Charlton Amor 02/25/2021, 5:51 AM

## 2021-02-24 NOTE — Progress Notes (Signed)
RN given meds

## 2021-02-24 NOTE — Progress Notes (Signed)
Occupational Therapy Session Note  Patient Details  Name: Erik [name] MRN: [id number] Date of Birth: 1991/01/25  Today's Date: 02/24/2021 OT Individual Time: 0063-4949 OT Individual Time Calculation (min): 70 min    Short Term Goals: Week 1:  OT Short Term Goal 1 (Week 1): Patient will complete sit<>stand with max A of 1 person OT Short Term Goal 1 - Progress (Week 1): Met OT Short Term Goal 2 (Week 1): Patient will maintain standing in [place] for 1 minute in prep for BADL task OT Short Term Goal 2 - Progress (Week 1): Met OT Short Term Goal 3 (Week 1): Pt will complete 1 step up LB dressing task OT Short Term Goal 3 - Progress (Week 1): Met  Skilled Therapeutic Interventions/Progress Updates:     Pt received in bed with no pain reported during session. Pt agreeable to BADL at shower level  ADL:  Pt completes bathing with set up seated in shower using sit to stands to wash buttocks and LHSS tow ash B feet Pt completes UB dressing with set up Pt completes LB dressing with set up at sit to stand level with RW Pt completes footwear with S using AE PRN for doffing/donning socks and shoes Pt completes toileting with S at sit to stand level using RW to steady self during 3/3 components Pt completes toileting transfer with S using RW for ambulatory level transfer Pt completes shower/Tub transfer with S using RW to sit on BSC in shower Standing grooming at sink with set up for improved endruance  Therapeutic activity Pt completes ambulation in hallway with 1 seated rest break to ADL apartment. tp transfers onto regular bed with S overall and no cuing needed for EOB<>sup technique. Pt completes seated UB rolling bolster up foam wedge at tabltop to target L shoulder/deltoid engagement in 3x1 min intervals with MIN A to reach top range. W/c propulsion back to room part way with min A for steering around R hand turn d/t L arm weakness.  Pt left at end of session in w/c with exit alarm on, call  light in reach and all needs met   Therapy Documentation Precautions:  Precautions Precautions: Fall Restrictions Weight Bearing Restrictions: No General:   Vital Signs: Therapy Vitals Temp: 98.5 F (36.9 C) Temp Source: Oral Pulse Rate: (!) 104 Resp: 16 BP: (!) 164/99 Patient Position (if appropriate): Lying Oxygen Therapy SpO2: 99 % O2 Device: Room Air Pain:   ADL: ADL Eating: Set up Grooming: Setup Upper Body Bathing: Moderate assistance Lower Body Bathing: Dependent Upper Body Dressing: Maximal assistance Lower Body Dressing: Dependent Toileting: Dependent Vision   Perception    Praxis   Exercises:   Other Treatments:     Therapy/Group: Individual Therapy  Tonny Branch 02/24/2021, 6:55 AM

## 2021-02-24 NOTE — Progress Notes (Signed)
Inpatient Rehabilitation Care Coordinator Discharge Note  The overall goal for the admission was met for:   Discharge location: Yes. D/c to home with 24/7 care.   Length of Stay: Yes. 22 days.   Discharge activity level: Yes. Supervision.   Home/community participation: Yes. Limited.   Services provided included: MD, RD, PT, OT, RN, CM, TR, Pharmacy, Neuropsych and SW  Financial Services: Other: Uninsured/Self-pay  Choices offered to/list presented to:yes  Follow-up services arranged: Outpatient: [facility] for outpatient PT/OT and DME: [facility] for bariatric 3in1 BSC and RW (charity)  Comments (or additional information):  Patient/Family verbalized understanding of follow-up arrangements: Yes  Individual responsible for coordination of the follow-up plan: Please contact pt or pt mother D[place] Ar [phone]  Confirmed correct DME delivered: Rana Snare 02/24/2021    Rana Snare

## 2021-02-24 NOTE — Progress Notes (Signed)
Patient ID: Erik [name], male   DOB: Sep 13, 1991, 30 y.o.   MRN: [id number]  SW spoke with pt mother Erik [name] to provide updates from team conference on gains made, d/c date remains 3/18, MATCH program, charity DME with [employer]- bariatric RW and 3in1 BSC, and outpatient PT/OT;  referral will be sent to [facility] (p:[phone]/f:[phone]). SW explained outpatient therapies, and discussing financial assistance since he is uninsured. SW also discussed importance of scheduling medical provider at either clinic [facility] or [facility].   SW faxed outpatient referral to [facility]. Pt set up for [facility] medication assistance program.   Cecile Sheerer, MSW, LCSWA Office: [phone] Cell: [phone] Fax: [phone]

## 2021-02-25 ENCOUNTER — Other Ambulatory Visit: Payer: Self-pay | Admitting: Physical Medicine and Rehabilitation

## 2021-02-25 LAB — GLUCOSE, CAPILLARY
Glucose-Capillary: 86 mg/dL (ref 70–99)
Glucose-Capillary: 121 mg/dL — ABNORMAL HIGH (ref 70–99)
Glucose-Capillary: 93 mg/dL (ref 70–99)
Glucose-Capillary: 108 mg/dL — ABNORMAL HIGH (ref 70–99)

## 2021-02-25 MED ORDER — FAMOTIDINE 20 MG PO TABS
20.0000 mg | ORAL_TABLET | Freq: Every day | ORAL | 0 refills | Status: DC
Start: 2021-02-25 — End: 2021-04-02

## 2021-02-25 MED ORDER — INSULIN ASPART 100 UNIT/ML FLEXPEN: 8.0000 [IU] | PEN_INJECTOR | Freq: Three times a day (TID) | SUBCUTANEOUS | 0 refills | Status: DC

## 2021-02-25 MED ORDER — COVID-19 MRNA VACC (MODERNA) 100 MCG/0.5ML IM SUSP
0.5000 mL | Freq: Once | INTRAMUSCULAR | Status: AC
  Administered 2021-02-25: 0.5 mL via INTRAMUSCULAR
  Filled 2021-02-25: qty 0.5

## 2021-02-25 MED ORDER — AMLODIPINE BESYLATE 5 MG PO TABS: 5.0000 mg | ORAL_TABLET | Freq: Every day | ORAL | 0 refills | Status: DC

## 2021-02-25 MED ORDER — ATORVASTATIN CALCIUM 80 MG PO TABS: 80.0000 mg | ORAL_TABLET | Freq: Every day | ORAL | 0 refills | Status: DC

## 2021-02-25 MED ORDER — ALBUTEROL SULFATE HFA 108 (90 BASE) MCG/ACT IN AERS: 1.0000 | INHALATION_SPRAY | Freq: Four times a day (QID) | RESPIRATORY_TRACT | Status: DC | PRN

## 2021-02-25 MED ORDER — HYDRALAZINE HCL 50 MG PO TABS: 50.0000 mg | ORAL_TABLET | Freq: Three times a day (TID) | ORAL | 0 refills | Status: DC

## 2021-02-25 MED ORDER — COLLAGENASE 250 UNIT/GM EX OINT: TOPICAL_OINTMENT | Freq: Every day | CUTANEOUS | 0 refills | Status: DC

## 2021-02-25 MED ORDER — INSULIN PEN NEEDLE 31G X 6 MM MISC: 1.0000 "application " | Freq: Three times a day (TID) | 0 refills | Status: DC

## 2021-02-25 MED ORDER — ASPIRIN 81 MG PO CHEW: 81.0000 mg | CHEWABLE_TABLET | Freq: Every day | ORAL | 0 refills | Status: DC

## 2021-02-25 MED ORDER — METOPROLOL TARTRATE 100 MG PO TABS
100.0000 mg | ORAL_TABLET | Freq: Two times a day (BID) | ORAL | 0 refills | Status: DC
Start: 2021-02-25 — End: 2021-04-02

## 2021-02-25 MED ORDER — DOCUSATE SODIUM 100 MG PO CAPS
100.0000 mg | ORAL_CAPSULE | Freq: Two times a day (BID) | ORAL | 0 refills | Status: DC | PRN
Start: 2021-02-25 — End: 2021-04-15

## 2021-02-25 MED ORDER — MUSCLE RUB 10-15 % EX CREA: 1.0000 "application " | TOPICAL_CREAM | Freq: Two times a day (BID) | CUTANEOUS | 0 refills | Status: DC

## 2021-02-25 MED ORDER — CLOPIDOGREL BISULFATE 75 MG PO TABS: 75.0000 mg | ORAL_TABLET | Freq: Every day | ORAL | 0 refills | Status: DC

## 2021-02-25 MED ORDER — NYSTATIN-TRIAMCINOLONE 100000-0.1 UNIT/GM-% EX CREA: TOPICAL_CREAM | Freq: Two times a day (BID) | CUTANEOUS | 0 refills | Status: DC

## 2021-02-25 MED ORDER — INSULIN DETEMIR 100 UNIT/ML FLEXPEN: 37.0000 [IU] | PEN_INJECTOR | Freq: Every day | SUBCUTANEOUS | 0 refills | Status: DC

## 2021-02-25 NOTE — Progress Notes (Signed)
Occupational Therapy Session Note  Patient Details  Name: Erik [name] MRN: [id number] Date of Birth: September 01, 1991  Today's Date: 02/25/2021 OT Individual Time: 1000-1040 OT Individual Time Calculation (min): 40 min    Short Term Goals: Week 1:  OT Short Term Goal 1 (Week 1): Patient will complete sit<>stand with max A of 1 person OT Short Term Goal 1 - Progress (Week 1): Met OT Short Term Goal 2 (Week 1): Patient will maintain standing in [place] for 1 minute in prep for BADL task OT Short Term Goal 2 - Progress (Week 1): Met OT Short Term Goal 3 (Week 1): Pt will complete 1 step up LB dressing task OT Short Term Goal 3 - Progress (Week 1): Met  Skilled Therapeutic Interventions/Progress Updates:     1;1. Pt received in bed agreeable to OT. Pt requesting to bowl. Pt complete ambulation to/from tx gym with setup with RW. Pt stands with set up at Wii for 2x10 frames for bowling with BUE for NMR/shoulder strengthening. Improvements in standing tolerance noted as pt did not need seated rest during games. Returned to room in same manner with cuing to keep feet turned forward as pt occasionally kicks front L walker. Exited session with pt seated in bed, exit alarm on and call light in reach   Therapy Documentation Precautions:  Precautions Precautions: Fall Restrictions Weight Bearing Restrictions: No General:   Vital Signs: Therapy Vitals Temp: 98.3 F (36.8 C) Temp Source: Oral Pulse Rate: 94 Resp: 14 BP: (!) 147/106 Patient Position (if appropriate): Lying Oxygen Therapy SpO2: 100 % O2 Device: Room Air Pain:   ADL: ADL Eating: Set up Grooming: Setup Upper Body Bathing: Moderate assistance Lower Body Bathing: Dependent Upper Body Dressing: Maximal assistance Lower Body Dressing: Dependent Toileting: Dependent Vision   Perception    Praxis   Exercises:   Other Treatments:     Therapy/Group: Individual Therapy  Tonny Branch 02/25/2021, 6:52 AM

## 2021-02-25 NOTE — Plan of Care (Signed)
  Problem: RH BOWEL ELIMINATION Goal: RH STG MANAGE BOWEL WITH ASSISTANCE Description: STG Manage Bowel with Min Assistance Outcome: Progressing   Problem: RH BLADDER ELIMINATION Goal: RH STG MANAGE BLADDER WITH ASSISTANCE Description: STG Manage Bladder With Min Assistance Outcome: Progressing   Problem: RH SKIN INTEGRITY Goal: RH STG SKIN FREE OF INFECTION/BREAKDOWN Description: Skin will remain free of infection or breakdown with mod assist. Outcome: Progressing   Problem: RH PAIN MANAGEMENT Goal: RH STG PAIN MANAGED AT OR BELOW PT'S PAIN GOAL Description: Patient will remain with pain less than 2 during stay.  Outcome: Progressing   Problem: Consults Goal: RH GENERAL PATIENT EDUCATION Description: See Patient Education module for education specifics. Outcome: Progressing

## 2021-02-25 NOTE — Progress Notes (Signed)
Physical Therapy Discharge Summary  Patient Details  Name: Erik [name] MRN: [id number] Date of Birth: 02-Jun-1991  Today's Date: 02/25/2021 PT Individual Time: 1433-1530 PT Individual Time Calculation (min): 57 min    Patient has met 8 of 8 long term goals due to improved activity tolerance, improved balance, improved postural control and increased strength.  Patient to discharge at an ambulatory level Supervision.   Patient's care partner is independent to provide the necessary physical assistance at discharge.  Reasons goals not met: NA  Recommendation:  Patient will benefit from ongoing skilled PT services in outpatient setting to continue to advance safe functional mobility, address ongoing impairments in strength, balance, ambulation, and minimize fall risk.  Equipment: bariatric RW  Reasons for discharge: treatment goals met and discharge from hospital  Patient/family agrees with progress made and goals achieved: Yes   Skilled Therapeutic Interventions:  Pt received seated in [facility] and agrees to therapy. No complaint of pain. WC transport to gym for time management. Pt performs multiple reps of sit to stand during session independently with increased time required. Pt performs NMR for standing balance with Wii bowling game, without use of upper extremity support, and performing high amplitude dynamic movements with R upper extremity. Pt performs car transfer and ramp navigation with cues on sequencing and body mechanics. Pt ambulates x200' with RW and cues for RW management and upright gaze to improve posture and balance. PT educates pt on importance of using RW for ambulation due to fall risk. Pt completes x8 6" steps with Bilateral hand rails and cues for foot placement and sequencing. PT provides pt with HEP and provides demonstration of each exercise. Pt left seated in WC with all needs within reach.  PT Discharge Precautions/Restrictions Precautions Precautions:  Fall Restrictions Weight Bearing Restrictions: No Vision/Perception  Perception Perception: Within Functional Limits Praxis Praxis: Intact  Cognition Overall Cognitive Status: Within Functional Limits for tasks assessed Arousal/Alertness: Awake/alert Orientation Level: Oriented X4 Safety/Judgment: Appears intact Sensation Sensation Light Touch: Appears Intact Coordination Gross Motor Movements are Fluid and Coordinated: Yes Fine Motor Movements are Fluid and Coordinated: Yes Motor  Motor Motor: Within Functional Limits Motor - Discharge Observations: Generalized weakness continues, but much improved since eval  Mobility Bed Mobility Bed Mobility: Sit to Supine;Supine to Sit Supine to Sit: Independent Sit to Supine: Independent Transfers Transfers: Sit to Stand;Stand to Sit;Stand Pivot Transfers Sit to Stand: Independent with assistive device Stand to Sit: Independent with assistive device Stand Pivot Transfers: Independent with assistive device Stand Pivot Transfer Details: Verbal cues for technique;Verbal cues for precautions/safety;Verbal cues for safe use of DME/AE;Tactile cues for posture;Tactile cues for weight shifting;Tactile cues for initiation Locomotion  Gait Ambulation: Yes Gait Assistance: Supervision/Verbal cueing Gait Distance (Feet): 200 Feet Assistive device: Rolling walker Gait Assistance Details: Verbal cues for precautions/safety;Verbal cues for gait pattern Gait Gait: Yes Gait Pattern: Impaired Gait Pattern:  (Decreased dorsiflexion. Narrow base of support.) Gait velocity: Decreased Stairs / Additional Locomotion Stairs: Yes Stairs Assistance: Supervision/Verbal cueing Stair Management Technique: Two rails Number of Stairs: 8 Height of Stairs: 6 Ramp: Supervision/Verbal cueing Curb: Supervision/Verbal cueing  Trunk/Postural Assessment  Cervical Assessment Cervical Assessment:  (forward head) Thoracic Assessment Thoracic Assessment:   (rounded shoulders) Lumbar Assessment Lumbar Assessment:  (posterior pelvic tilt) Postural Control Postural Control: Within Functional Limits  Balance Balance Balance Assessed: Yes Static Sitting Balance Static Sitting - Level of Assistance: 7: Independent Dynamic Sitting Balance Dynamic Sitting - Level of Assistance: 7: Independent Static Standing Balance Static Standing -  Level of Assistance: 6: Modified independent (Device/Increase time) Dynamic Standing Balance Dynamic Standing - Balance Support: During functional activity Dynamic Standing - Level of Assistance: 6: Modified independent (Device/Increase time) Extremity Assessment  RLE Assessment Passive Range of Motion (PROM) Comments: Limited by body habitus General Strength Comments: Hip flexion 4/5, Knee extension 5/5, PF/DF 2+/5 LLE Assessment LLE Assessment: Exceptions to [facility] General Strength Comments: Hip flexion 4/5, Knee extension 5/5, PF/DF 3/5    Breck Coons, PT, DPT 02/25/2021, 3:46 PM

## 2021-02-25 NOTE — Plan of Care (Signed)
  Problem: RH Balance Goal: LTG: Patient will maintain dynamic sitting balance (OT) Description: LTG:  Patient will maintain dynamic sitting balance with assistance during activities of daily living (OT) Outcome: Completed/Met Goal: LTG Patient will maintain dynamic standing with ADLs (OT) Description: LTG:  Patient will maintain dynamic standing balance with assist during activities of daily living (OT)  Outcome: Completed/Met   Problem: Sit to Stand Goal: LTG:  Patient will perform sit to stand in prep for activites of daily living with assistance level (OT) Description: LTG:  Patient will perform sit to stand in prep for activites of daily living with assistance level (OT) Outcome: Completed/Met   Problem: RH Grooming Goal: LTG Patient will perform grooming w/assist,cues/equip (OT) Description: LTG: Patient will perform grooming with assist, with/without cues using equipment (OT) Outcome: Completed/Met   Problem: RH Bathing Goal: LTG Patient will bathe all body parts with assist levels (OT) Description: LTG: Patient will bathe all body parts with assist levels (OT) Outcome: Completed/Met   Problem: RH Dressing Goal: LTG Patient will perform upper body dressing (OT) Description: LTG Patient will perform upper body dressing with assist, with/without cues (OT). Outcome: Completed/Met Goal: LTG Patient will perform lower body dressing w/assist (OT) Description: LTG: Patient will perform lower body dressing with assist, with/without cues in positioning using equipment (OT) Outcome: Completed/Met   Problem: RH Toileting Goal: LTG Patient will perform toileting task (3/3 steps) with assistance level (OT) Description: LTG: Patient will perform toileting task (3/3 steps) with assistance level (OT)  Outcome: Completed/Met   Problem: RH Functional Use of Upper Extremity Goal: LTG Patient will use RT/LT upper extremity as a (OT) Description: LTG: Patient will use right/left upper  extremity as a stabilizer/gross assist/diminished/nondominant/dominant level with assist, with/without cues during functional activity (OT) Outcome: Completed/Met   Problem: RH Toilet Transfers Goal: LTG Patient will perform toilet transfers w/assist (OT) Description: LTG: Patient will perform toilet transfers with assist, with/without cues using equipment (OT) Outcome: Completed/Met   Problem: RH Tub/Shower Transfers Goal: LTG Patient will perform tub/shower transfers w/assist (OT) Description: LTG: Patient will perform tub/shower transfers with assist, with/without cues using equipment (OT) Outcome: Completed/Met

## 2021-02-25 NOTE — Progress Notes (Signed)
PROGRESS NOTE   Subjective/Complaints: Up in shower with OT. Notes some drainage from right chest cysts again. Asked what he should do. Feeling well otherwise  ROS: Patient denies fever, rash, sore throat, blurred vision, nausea, vomiting, diarrhea, cough, shortness of breath or chest pain, joint or back pain, headache, or mood change.   Objective:   No results found. No results for input(s): WBC, HGB, HCT, PLT in the last 72 hours. No results for input(s): NA, K, CL, CO2, GLUCOSE, BUN, CREATININE, CALCIUM in the last 72 hours.  Intake/Output Summary (Last 24 hours) at 02/25/2021 0956 Last data filed at 02/25/2021 0739 Gross per 24 hour  Intake 600 ml  Output 2000 ml  Net -1400 ml        Physical Exam: Vital Signs Blood pressure (!) 147/106, pulse 94, temperature 98.3 F (36.8 C), temperature source Oral, resp. rate 14, height 5\' 10"  (1.778 m), weight (!) 179 kg, SpO2 100 %. Constitutional: No distress . Vital signs reviewed. HEENT: EOMI, oral membranes moist Neck: supple Cardiovascular: RRR without murmur. No JVD    Respiratory/Chest: CTA Bilaterally without wheezes or rales. Normal effort    GI/Abdomen: BS +, non-tender, non-distended Ext: no clubbing, cyanosis, or edema Psych: pleasant and cooperative Skin: back incision with decreased fibronecrotic debris -chest with 2, close proximity sebaceous cysts with milky drainage Musc: No edema in extremities.  No tenderness in extremities. Neuro: Alert Motor: 4+ to 5/5 throughout, except for left proximal upper extremity slightly decreased, unchanged  Assessment/Plan: 1. Functional deficits which require 3+ hours per day of interdisciplinary therapy in a comprehensive inpatient rehab setting.  Physiatrist is providing close team supervision and 24 hour management of active medical problems listed below.  Physiatrist and rehab team continue to assess barriers to  discharge/monitor patient progress toward functional and medical goals  Care Tool:  Bathing  Bathing activity did not occur: Refused Body parts bathed by patient: Right arm,Left arm,Front perineal area,Chest,Abdomen,Buttocks,Right lower leg,Face,Left lower leg,Left upper leg,Right upper leg   Body parts bathed by helper: Right arm,Buttocks,Right upper leg,Left upper leg,Right lower leg,Left lower leg     Bathing assist Assist Level: Independent with assistive device Assistive Device Comment: LH sponge   Upper Body Dressing/Undressing Upper body dressing   What is the patient wearing?: Pull over shirt    Upper body assist Assist Level: Independent    Lower Body Dressing/Undressing Lower body dressing      What is the patient wearing?: Pants,Underwear/pull up     Lower body assist Assist for lower body dressing: Independent     Toileting Toileting    Toileting assist Assist for toileting: Independent     Transfers Chair/bed transfer  Transfers assist     Chair/bed transfer assist level: Independent with assistive device Chair/bed transfer assistive device:   Ambulation assist      Assist level: Supervision/Verbal cueing Assistive device: Walker-rolling Max distance: 160'   Walk 10 feet activity   Assist  Walk 10 feet activity did not occur: Safety/medical concerns  Assist level: Supervision/Verbal cueing Assistive device: Walker-rolling   Walk 50 feet activity   Assist Walk 50 feet with 2 turns activity  did not occur: Safety/medical concerns  Assist level: Supervision/Verbal cueing Assistive device: Walker-rolling    Walk 150 feet activity   Assist Walk 150 feet activity did not occur: Safety/medical concerns  Assist level: Supervision/Verbal cueing Assistive device: Walker-rolling    Walk 10 feet on uneven surface  activity   Assist Walk 10 feet on uneven surfaces activity did not occur:  Safety/medical concerns         Wheelchair     Assist Will patient use wheelchair at discharge?: No             Wheelchair 50 feet with 2 turns activity    Assist            Wheelchair 150 feet activity     Assist          Blood pressure (!) 147/106, pulse 94, temperature 98.3 F (36.8 C), temperature source Oral, resp. rate 14, height 5\' 10"  (1.778 m), weight (!) 179 kg, SpO2 100 %.    Medical Problem List and Plan: 1.  Debility secondary to COVID-19 with course complicated by MI/STEMI as well as AKI. 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  2.  Antithrombotics: -DVT/anticoagulation: Continue Lovenox 82.5 mg daily- d/c given bruising and excellent ambulation             -antiplatelet therapy: Aspirin 81 mg daily and Plavix 85 mg daily 3. Pain Management:  D/c oxycodone- not requiring.   Has Kpad as well  Controlled on 3/17 4. Mood: Provide emotional support             -antipsychotic agents: N/A 5. Neuropsych: This patient is capable of making decisions on his own behalf. 6. Skin/Wound Care: Routine skin checks  -3/17continue santyl to back wound daily   -expression/warm moist compress to chest sebaceous cysts  - specialty mattress   7. Fluids/Electrolytes/Nutrition: Routine in and outs 8.  AKI.  Renal function improved.  CRRT discontinued.  Creatinine 1.76 3/14==new baseline  Encourage fluids 9.  COVID-19.  Patient completed course of remdesivir as well as steroids.  Contact precautions discontinued 10.  Super obesity.  BMI 53.52 dietary follow-up 11.  Newly diagnosed uncontrolled diabetes mellitus.  Hemoglobin A1c 13.6.  Continue NovoLog 8 units 3 times daily, Levemir 37 units daily  Good control on 3/17 12.  Acute inferior STEMI with associated diastolic heart failure  with V. tach.  Cardiology follow-up.  Cardiac catheterization 01/20/2021 no significant CAD.   13.  Hyperlipidemia: Lipitor 14. Uncontrolled hypertension:  Increased hydralazine to 50 TID  Norvasc 2.5 started on 3/13---increased to 5mg  3/16   -may need further titration  LOS: 22 days A FACE TO FACE EVALUATION WAS PERFORMED  02/25/2021, 9:56 AM

## 2021-02-25 NOTE — Progress Notes (Signed)
Occupational Therapy Discharge Summary  Patient Details  Name: Erik [name] MRN: [id number] Date of Birth: [birth year]  Today's Date: 02/25/2021 OT Individual Time: 8366-2947 OT Individual Time Calculation (min): 75 min   OT treatment session focused on increased independence with BADL tasks. Pt able to access dresser drawers, collect clothing, ambulate to bathroom with RW all without assist from OT. Bathing/dressing completed mod I with increased time and use of LH sponge. OT placed Santyl on back wound and covered with gauze and tape. Pt then ambulated around dayroom and back to room w/ RW mod I and 1 standing rest break. Pt left seated in wc at end of session with needs met. See functional navigator for further details.  Patient has met 11 of 11 long term goals due to improved activity tolerance, improved balance, postural control, ability to compensate for deficits and functional use of  LEFT upper and LEFT lower extremity.  Patient to discharge at overall Modified Independent /supervision level.  Patient's care partner is independent to provide the necessary physical assistance at discharge for higher level iADL tasks.    Reasons goals not met: n/a  Recommendation:  Patient will benefit from ongoing skilled OT services in outpatient setting to continue to advance functional skills in the area of BADL and functional use of L UE.  Equipment: Bariatric 3-in-1 BSC, and Bariatric RW  Reasons for discharge: treatment goals met and discharge from hospital  Patient/family agrees with progress made and goals achieved: Yes  OT Discharge Precautions/Restrictions  Precautions Precautions: Fall Restrictions Weight Bearing Restrictions: No Pain  denies pain ADL ADL Eating: Independent Grooming: Independent Upper Body Bathing: Modified independent Lower Body Bathing: Modified independent Upper Body Dressing: Independent Lower Body Dressing: Independent Toileting: Modified  independent Toilet Transfer: Modified independent Perception  Perception: Within Functional Limits Praxis Praxis: Intact Cognition Overall Cognitive Status: Within Functional Limits for tasks assessed Arousal/Alertness: Awake/alert Orientation Level: Oriented X4 Memory: Appears intact Safety/Judgment: Appears intact Sensation Sensation Light Touch: Appears Intact Coordination Fine Motor Movements are Fluid and Coordinated: Yes Motor  Motor Motor - Discharge Observations: Generalized weakness continues, but much improved since eval Mobility  Bed Mobility Supine to Sit: Independent Sit to Supine: Independent Transfers Sit to Stand: Independent with assistive device Stand to Sit: Independent with assistive device  Balance Static Sitting Balance Static Sitting - Balance Support: Feet supported Static Sitting - Level of Assistance: 7: Independent Dynamic Sitting Balance Dynamic Sitting - Balance Support: Feet supported Dynamic Sitting - Level of Assistance: 7: Independent Static Standing Balance Static Standing - Balance Support: During functional activity Static Standing - Level of Assistance: 6: Modified independent (Device/Increase time) Dynamic Standing Balance Dynamic Standing - Balance Support: During functional activity Dynamic Standing - Level of Assistance: 6: Modified independent (Device/Increase time) Extremity/Trunk Assessment RUE Assessment RUE Assessment: Within Functional Limits LUE Assessment LUE Assessment: Exceptions to [facility] Passive Range of Motion (PROM) Comments: [facility] General Strength Comments: Limited L shoulder flexion to 80 degrees FF, 90 degrees abduction   Erik [name] 02/25/2021, 8:47 AM

## 2021-02-26 ENCOUNTER — Other Ambulatory Visit: Payer: Self-pay | Admitting: Physical Medicine and Rehabilitation

## 2021-02-26 DIAGNOSIS — N6081 Other benign mammary dysplasias of right breast: Secondary | ICD-10-CM

## 2021-02-26 LAB — GLUCOSE, CAPILLARY: Glucose-Capillary: 98 mg/dL (ref 70–99)

## 2021-02-26 MED ORDER — ALBUTEROL SULFATE HFA 108 (90 BASE) MCG/ACT IN AERS: 1.0000 | INHALATION_SPRAY | Freq: Four times a day (QID) | RESPIRATORY_TRACT | 0 refills | Status: DC | PRN

## 2021-02-26 MED ORDER — AMLODIPINE BESYLATE 10 MG PO TABS: 10.0000 mg | ORAL_TABLET | Freq: Every day | ORAL | 0 refills | Status: DC

## 2021-02-26 MED ORDER — AMLODIPINE BESYLATE 10 MG PO TABS
10.0000 mg | ORAL_TABLET | Freq: Every day | ORAL | Status: DC
  Filled 2021-02-26: qty 1

## 2021-02-26 MED ORDER — BLOOD GLUCOSE MONITOR KIT: PACK | 0 refills | Status: AC

## 2021-02-26 NOTE — Progress Notes (Signed)
PROGRESS NOTE   Subjective/Complaints: Pt feeling well. No new complaints.   ROS: Patient denies fever, rash, sore throat, blurred vision, nausea, vomiting, diarrhea, cough, shortness of breath or chest pain, joint or back pain, headache, or mood change.    Objective:   No results found. No results for input(s): WBC, HGB, HCT, PLT in the last 72 hours. No results for input(s): NA, K, CL, CO2, GLUCOSE, BUN, CREATININE, CALCIUM in the last 72 hours.  Intake/Output Summary (Last 24 hours) at 02/26/2021 0925 Last data filed at 02/26/2021 0816 Gross per 24 hour  Intake 600 ml  Output 1850 ml  Net -1250 ml        Physical Exam: Vital Signs Blood pressure (!) 163/95, pulse 96, temperature 98.4 F (36.9 C), temperature source Oral, resp. rate 18, height 5\' 10"  (1.778 m), weight (!) 179 kg, SpO2 98 %. Constitutional: No distress . Vital signs reviewed. obese HEENT: EOMI, oral membranes moist Neck: supple Cardiovascular: RRR without murmur. No JVD    Respiratory/Chest: CTA Bilaterally without wheezes or rales. Normal effort    GI/Abdomen: BS +, non-tender, non-distended Ext: no clubbing, cyanosis, or edema Psych: pleasant and cooperative Skin: back incision with decreasing fibronecrotic debris--still a superficial lauyer -chest with 2 cystic lesions, no visible drainage today Musc: swelling left thigh.  No tenderness in extremities. Neuro: Alert Motor: 4+ to 5/5 throughout, except for left proximal upper extremity slightly decreased, unchanged  Assessment/Plan: 1. Functional deficits which require 3+ hours per day of interdisciplinary therapy in a comprehensive inpatient rehab setting.  Physiatrist is providing close team supervision and 24 hour management of active medical problems listed below.  Physiatrist and rehab team continue to assess barriers to discharge/monitor patient progress toward functional and medical  goals  Care Tool:  Bathing  Bathing activity did not occur: Refused Body parts bathed by patient: Right arm,Left arm,Front perineal area,Chest,Abdomen,Buttocks,Right lower leg,Face,Left lower leg,Left upper leg,Right upper leg   Body parts bathed by helper: Right arm,Buttocks,Right upper leg,Left upper leg,Right lower leg,Left lower leg     Bathing assist Assist Level: Independent with assistive device Assistive Device Comment: LH sponge   Upper Body Dressing/Undressing Upper body dressing   What is the patient wearing?: Pull over shirt    Upper body assist Assist Level: Independent    Lower Body Dressing/Undressing Lower body dressing      What is the patient wearing?: Pants,Underwear/pull up     Lower body assist Assist for lower body dressing: Independent     Toileting Toileting    Toileting assist Assist for toileting: Independent     Transfers Chair/bed transfer  Transfers assist     Chair/bed transfer assist level: Independent Chair/bed transfer assistive device: assist      Assist level: Supervision/Verbal cueing Assistive device: Walker-rolling Max distance: 200'   Walk 10 feet activity   Assist  Walk 10 feet activity did not occur: Safety/medical concerns  Assist level: Supervision/Verbal cueing Assistive device: Walker-rolling   Walk 50 feet activity   Assist Walk 50 feet with 2 turns activity did not occur: Safety/medical concerns  Assist level: Supervision/Verbal cueing Assistive device: Walker-rolling  Walk 150 feet activity   Assist Walk 150 feet activity did not occur: Safety/medical concerns  Assist level: Supervision/Verbal cueing Assistive device: Walker-rolling    Walk 10 feet on uneven surface  activity   Assist Walk 10 feet on uneven surfaces activity did not occur: Safety/medical concerns   Assist level: Supervision/Verbal cueing Assistive device:  (R hand  rail)   Wheelchair     Assist Will patient use wheelchair at discharge?: No             Wheelchair 50 feet with 2 turns activity    Assist            Wheelchair 150 feet activity     Assist          Blood pressure (!) 163/95, pulse 96, temperature 98.4 F (36.9 C), temperature source Oral, resp. rate 18, height 5\' 10"  (1.778 m), weight (!) 179 kg, SpO2 98 %.    Medical Problem List and Plan: 1.  Debility secondary to COVID-19 with course complicated by MI/STEMI as well as AKI. 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  Patient to see MD in the office for transitional care encounter in 1-2 weeks.  2.  Antithrombotics: -DVT/anticoagulation: ambulating             -antiplatelet therapy: Aspirin 81 mg daily and Plavix 85 mg daily 3. Pain Management:  D/c oxycodone- not requiring.   Has Kpad as well  Controlled on 3/17 4. Mood: Provide emotional support             -antipsychotic agents: N/A 5. Neuropsych: This patient is capable of making decisions on his own behalf. 6. Skin/Wound Care: Routine skin checks  -3/18 continue santyl to back wound daily--may need bid   -continue expression/warm moist compress to chest sebaceous cysts  - specialty mattress   7. Fluids/Electrolytes/Nutrition: Routine in and outs 8.  AKI.  Renal function improved.  CRRT discontinued.  Creatinine 1.76 3/14==new baseline  Encouraging fluids 9.  COVID-19.  Patient completed course of remdesivir as well as steroids.  Contact precautions discontinued 10.  Super obesity.  BMI 53.52 dietary follow-up 11.  Newly diagnosed uncontrolled diabetes mellitus.  Hemoglobin A1c 13.6.  Continue NovoLog 8 units 3 times daily, Levemir 37 units daily  Good control on 3/18 12.  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 tach.  Cardiology follow-up.  Cardiac catheterization 01/20/2021 no  significant CAD.   13.  Hyperlipidemia: Lipitor 14. Uncontrolled hypertension:  Increased hydralazine to 50 TID  3/18 increase norvasc to 10mg  daily.    LOS: 23 days A FACE TO FACE EVALUATION WAS PERFORMED  4/18 02/26/2021, 9:25 AM

## 2021-02-26 NOTE — Progress Notes (Signed)
This nurse in with pt/family to discuss discharge instructions. Pt/family in agreement, questions answered. Belongings gathered. Dressing change completed. Pt personal walker and BSC gathered with belongings. Meds returned to pt. Pt left per wheelchair to private vehicle. No complications noted. Mylo Red, LPN

## 2021-02-26 NOTE — Discharge Instructions (Signed)
Inpatient Rehab Discharge Instructions  Erik [name] Discharge date and time: 02/26/21   Activities/Precautions/ Functional Status: Activity: activity as tolerated Diet: Diabetic diet/heart healthy.  Wound Care: cleanse area with soap and water, Pat dry then apply santyl ointment daily applied to a slightly moistened and folded 2 x 2 and placed snugly on back wound cover with dry 2 x 2 and tape securely.   Functional status:  ___ No restrictions     ___ Walk up steps independently _X__ 24/7 supervision/assistance   ___ Walk up steps with assistance ___ Intermittent supervision/assistance  ___ Bathe/dress independently ___ Walk with walker     _x__ Bathe/dress with assistance ___ Walk Independently    ___ Shower independently ___ Walk with assistance    ___ Shower with assistance _X__ No alcohol     ___ Return to work/school ________   COMMUNITY REFERRALS UPON DISCHARGE:     Outpatient: PT     OT             Agency: [facility] Phone: [phone]             Appointment Date/Time:*Please expect follow-up within 7-10 business days to schedule your appointment. If you have not received follow-up, be sure to contact the site directly.*  Medical Equipment/Items Ordered: bariatric RW and bariatric 3in1 BSC                                                 Agency/Supplier:  GENERAL COMMUNITY RESOURCES FOR PATIENT/FAMILY: Community resources provided for medical care- [employer] or [facility]  Special Instructions: 1. No driving smoking or alcohol  My questions have been answered and I understand these instructions. I will adhere to these goals and the provided educational materials after my discharge from the hospital.  Patient/Caregiver Signature _______________________________ Date __________  Clinician Signature _______________________________________ Date __________  Please bring this form and your medication list with you to all your follow-up doctor's appointments.

## 2021-03-02 ENCOUNTER — Telehealth: Payer: Self-pay

## 2021-03-02 NOTE — Telephone Encounter (Addendum)
Transitional Care Call--who you spoke with (Mother) Erik [name]   1. Are you/is patient experiencing any problems since coming home? None.  Are there any questions regarding any aspect of care? No questions.  2. Are there any questions regarding medications administration/dosing? No.   Are meds being taken as prescribed? Yes.  Patient should review meds with caller to confirm. Done.  ( Patient will check [facility] for Multivitamin).  3. Have there been any falls? None. 4. Has Home Health been to the house and/or have they contacted you? PT to start tomorrow.  If not, have you tried to contact them? Appointments scheduled and process of being made.  Can we help you contact them? No. 5. Are bowels and bladder emptying properly? Yes. . Are there any unexpected incontinence issues? None. If applicable, is patient following bowel/bladder programs? No issues.  6. Any fevers, problems with breathing, unexpected pain? No. He is still trying to increase endurance.  7. Are there any skin problems or new areas of breakdown? No. Per Mother his is improving and the wound looks okay. 8. Has the patient/family member arranged specialty MD follow up (ie cardiology/neurology/renal/surgical/etc)? No.   Can we help arrange?  Mother has the information.  9. Does the patient need any other services or support that we can help arrange? No. She has been advised to call back if further assistance is needed. 10. Are caregivers following through as expected in assisting the patient? Yes.         11. Has the patient quit smoking, drinking alcohol, or using drugs as recommended? Yes.  Appointment: Jacalyn Lefevre NP on 03/10/2021 at 2:20 PM. (Appointment will need to be re-scheduled because patient has Physical Therapy & Nephrology appointment.   Appointment Date/Time/ Arrival time/ and who they are seeing [address]

## 2021-03-02 NOTE — Telephone Encounter (Signed)
Please call Osie Bond ph# [phone] (Patient  Mother):  Appointment with Jacalyn Lefevre NP on 03/10/2021 at 2:20 PM. Appointment will need to be re-scheduled.  Because patient has Physical Therapy & Nephrology appointment.

## 2021-03-03 ENCOUNTER — Ambulatory Visit: Payer: Self-pay | Attending: Physician Assistant | Admitting: Occupational Therapy

## 2021-03-03 ENCOUNTER — Encounter: Payer: Self-pay | Admitting: Occupational Therapy

## 2021-03-03 ENCOUNTER — Other Ambulatory Visit: Payer: Self-pay

## 2021-03-03 DIAGNOSIS — R278 Other lack of coordination: Secondary | ICD-10-CM | POA: Insufficient documentation

## 2021-03-03 DIAGNOSIS — M6281 Muscle weakness (generalized): Secondary | ICD-10-CM | POA: Insufficient documentation

## 2021-03-03 NOTE — Therapy (Signed)
[facility] MAIN [facility] SERVICES [address] [place], [place], [address] Phone: [phone]   Fax:  [phone]  Occupational Therapy Evaluation  Patient Details  Name: Erik [name] MRN: [id number] Date of Birth: 1991/11/04 Referring Provider (OT): 01/12/2021   Encounter Date: 03/03/2021   OT End of Session - 03/03/21 1541    Visit Number 1    Number of Visits 24    Date for OT Re-Evaluation 06/02/21    OT Start Time 1000    OT Stop Time 1100    OT Time Calculation (min) 60 min    Activity Tolerance Patient tolerated treatment well    Behavior During Therapy [facility] for tasks assessed/performed           Past Medical History:  Diagnosis Date  . Asthma   . Diabetes mellitus without complication [facility])     Past Surgical History:  Procedure Laterality Date  . LEFT HEART CATH AND CORONARY ANGIOGRAPHY N/A 01/20/2021   Procedure: LEFT HEART CATH AND CORONARY ANGIOGRAPHY;  Surgeon: Yvonne Kendall, MD;  Location: [facility] INVASIVE CV LAB;  Service: Cardiovascular;  Laterality: N/A;    There were no vitals filed for this visit.   Subjective Assessment - 03/03/21 1530    Subjective  Pt. was present with his mother today.    Pertinent History Pt. is a 30 y.o. male who was admitted to [facility] with progressive weakness,  and COVID-19 on 01/12/2021. Pt. transitioned to inpatient rehab at [facility]. PMHx includes: HTN, DM, AKI, Superobesity, Acute inferior STEMI with assiciated Diastolic heart failure, and V tach. Hyperlipidemia, Critical illness myopathy, Back wound. Pt. resides with his mother, and grandfather. Pt. enjoys reading, and playing video games.    Currently in Pain? No/denies             [facility] OT Assessment - 03/03/21 0001      Assessment   Medical Diagnosis Debility    Referring Provider (OT) 01/12/2021    Hand Dominance Right    Prior Therapy OT/PT      Precautions   Precautions None      Restrictions   Weight Bearing Restrictions  No      Balance Screen   Has the patient fallen in the past 6 months No    Has the patient had a decrease in activity level because of a fear of falling?  No    Is the patient reluctant to leave their home because of a fear of falling?  No      Home  Environment   Family/patient expects to be discharged to: Private residence    Living Arrangements Children    Available Help at Discharge Family    Type of Home Aartment   3rd floor   Home Access Stairs   elevator   Home Layout One level    Bathroom Shower/Tub Tub/Shower unit    Shower/tub characteristics Curtain    Chartered certified accountant - 2 wheels;Bedside commode    Lives With Family      Prior Function   Level of Independence Independent    Leisure Reading, Video games      ADL   Eating/Feeding Independent    Grooming Independent    Upper Body Bathing Independent   Sponge bath   Lower Body Bathing Independent    Upper Body Dressing Independent    Lower Body Dressing Independent    Toilet Transfer Modified independent    Toileting -  Hygiene Independent    Tub/Shower Transfer --   N/A Pt. is taking sponge baths     IADL   Prior Level of Function Shopping Independent    Shopping Needs to be accompanied on any shopping trip;Completely unable to shop    Prior Level of Function Light Housekeeping Independent    Light Housekeeping Needs help with all home maintenance tasks    Prior Level of Function Meal Prep Independent    Meal Prep Able to complete simple cold meal and snack prep    Prior Level of Function Community Mobility Independent    Community Mobility Relies on family or friends for transportation    Prior Level of Function Medication Managment Independent    Medication Management Is responsible for taking medication in correct dosages at correct time    Prior Level of Function Chemical engineer financial matters independently (budgets, writes  checks, pays rent, bills goes to bank), collects and keeps track of income      Mobility   Mobility Status Needs assist      Written Expression   Dominant Hand Right    Handwriting --   Reports no changes     Vision - History   Baseline Vision Wears glasses only for reading   Wears glasses, however needs a new perscription     Activity Tolerance   Activity Tolerance --   10 min. of activity prior to fatigue     Cognition   Overall Cognitive Status Within Functional Limits for tasks assessed      Sensation   Light Touch Appears Intact    Proprioception Appears Intact      Coordination   Gross Motor Movements are Fluid and Coordinated Yes    Fine Motor Movements are Fluid and Coordinated No    Right 9 Hole Peg Test 24    Left 9 Hole Peg Test 30      AROM   Overall AROM Comments Left shoulder flexion 62(82), abduction 82(95)      Strength   Overall Strength Comments RUE strength 4/5, Left shoulder flexion, abduction 3-/5, elbow flexion extension, wrist flexion extension 4/5      Hand Function   Right Hand Grip (lbs) 70    Right Hand Lateral Pinch 19 lbs    Right Hand 3 Point Pinch 17 lbs    Left Hand Grip (lbs) 68    Left Hand Lateral Pinch 14 lbs    Left 3 point pinch 12 lbs                           OT Education - 03/03/21 1541    Education Details OT services, POC, goals    Person(s) Educated Patient    Methods Explanation    Comprehension Verbalized understanding;Returned demonstration               OT Long Term Goals - 03/03/21 1555      OT LONG TERM GOAL #1   Title Pt. will increase LUE shoulder ROM to be able to retrieve items from his closet, and cabinetry.    Baseline Eval: Left shoulder flexion 62(82), abduction: 82(95)    Time 12    Period Weeks    Status New    Target Date 06/02/21      OT LONG TERM GOAL #2   Title Pt. will improve standing tolerance to be able to perform 20 min. of  IADL tasks without rest breaks.     Baseline Eval: Pt. with poor activity tolerance for standing IADL tasks.    Time 12    Period Weeks    Status New    Target Date 06/02/21      OT LONG TERM GOAL #3   Title Pt. will improve left hand [facility] skills to be able to manipulate small objects during ADLs, and IADLs    Baseline Eval: Decreased left hand [facility] skills    Time 12    Period Weeks    Status New    Target Date 06/02/21      OT LONG TERM GOAL #4   Title Pt. will improve left pinch strength by 2# to be able topen bottles    Baseline Eval: Limited left hand pinch strength    Time 12    Period Weeks    Status New    Target Date 06/02/21      OT LONG TERM GOAL #5   Title Pt. will perform light homemaking skills with modified independence.    Baseline Eval: Pt. is unable to perform secondary to limited endurance,a nd activity tolerance    Time 12    Period Weeks    Status New    Target Date 06/02/21                 Plan - 03/03/21 1542    Clinical Impression Statement Pt. is  30 y.o. who was admitted to [facility] with weakness, and COVID-19. Pt. presents with  decreased activity tolerance, weakness, limited shoulder ROM, LUE weakness, limited pinch strength, and [facility] skills which limits his ability to perform daily ADL, and IADL tasks. Pt.'s FOTO score is 45. Pt. could benefit from OT services to work on improving activity tolarance for standing IADL tasks, improving LUE ROM for improved functional reaching into cabinetry, and closets, improving  LUE strength, [facility] skills in order to improve, and maximize independence with ADLs, and IADL tasks.    Occupational performance deficits (Please refer to evaluation for details): ADL's;IADL's    Body Structure / Function / Physical Skills ADL;IADL;Endurance;Proprioception;ROM;FMC    Rehab Potential Good    Clinical Decision Making Several treatment options, min-mod task modification necessary    Comorbidities Affecting Occupational Performance: May have comorbidities impacting  occupational performance    Modification or Assistance to Complete Evaluation  Min-Moderate modification of tasks or assist with assess necessary to complete eval    OT Frequency 2x / week    OT Duration 12 weeks    OT Treatment/Interventions Self-care/ADL training;Neuromuscular education;Therapeutic activities;Patient/family education;DME and/or AE instruction;Functional Mobility Training    Consulted and Agree with Plan of Care Patient           Patient will benefit from skilled therapeutic intervention in order to improve the following deficits and impairments:   Body Structure / Function / Physical Skills: ADL,IADL,Endurance,Proprioception,ROM,FMC       Visit Diagnosis: Muscle weakness (generalized)  Other lack of coordination    Problem List Patient Active Problem List   Diagnosis Date Noted  . Essential hypertension   . Diabetes mellitus, new onset (HCC)   . AKI (acute kidney injury) (HCC)   . Debility 02/03/2021  . Super obesity   . Dyslipidemia   . VT (ventricular tachycardia) (HCC)   . Elevated troponin   . Acute respiratory failure with hypoxia (HCC)   . ST elevation myocardial infarction (STEMI) (HCC)   . Acute hypoxemic respiratory failure due to COVID-19 [facility])  01/12/2021  . DKA (diabetic ketoacidosis) (HCC) 01/12/2021  . Obesity, Class III, BMI 40-49.9 (morbid obesity) (HCC) 01/12/2021  . Acute metabolic encephalopathy 01/12/2021  . ARDS (adult respiratory distress syndrome) (HCC) 01/12/2021    Olegario MessierElaine Pia Jedlicka, MS, OTR/L 03/03/2021, 5:55 PM  [facility] [address] [place], [place], [address] Phone: [phone]   Fax:  [phone]  Name: Biagio BorgJustin M Apt MRN: [id number] Date of Birth: 05/24/1991

## 2021-03-05 ENCOUNTER — Telehealth: Payer: Self-pay

## 2021-03-05 NOTE — Telephone Encounter (Signed)
I called back at this number and there was no answer nor was mail box set up. I was unable to leave a message. I agree with your recommendations if he has further sx or worsening

## 2021-03-05 NOTE — Telephone Encounter (Signed)
Having trouble breathing last night while sleeping, pt states he feels congested and took a Mucinex. Still having trouble breathing today as he is awake. Let mother know I will send message and suggest take to ED if worsen.

## 2021-03-07 ENCOUNTER — Emergency Department: Payer: Self-pay

## 2021-03-07 ENCOUNTER — Other Ambulatory Visit: Payer: Self-pay

## 2021-03-07 ENCOUNTER — Inpatient Hospital Stay
Admission: EM | Admit: 2021-03-07 | Discharge: 2021-03-10 | DRG: 871 | Disposition: A | Discharge status: Home / Self Care | Payer: Self-pay | Attending: Internal Medicine | Admitting: Internal Medicine

## 2021-03-07 DIAGNOSIS — E8770 Fluid overload, unspecified: Secondary | ICD-10-CM | POA: Diagnosis present

## 2021-03-07 DIAGNOSIS — E66813 Obesity, class 3: Secondary | ICD-10-CM | POA: Diagnosis present

## 2021-03-07 DIAGNOSIS — E876 Hypokalemia: Secondary | ICD-10-CM | POA: Diagnosis present

## 2021-03-07 DIAGNOSIS — Z6841 Body Mass Index (BMI) 40.0 and over, adult: Secondary | ICD-10-CM

## 2021-03-07 DIAGNOSIS — E119 Type 2 diabetes mellitus without complications: Secondary | ICD-10-CM

## 2021-03-07 DIAGNOSIS — E785 Hyperlipidemia, unspecified: Secondary | ICD-10-CM | POA: Diagnosis present

## 2021-03-07 DIAGNOSIS — I272 Pulmonary hypertension, unspecified: Secondary | ICD-10-CM | POA: Diagnosis present

## 2021-03-07 DIAGNOSIS — Z7902 Long term (current) use of antithrombotics/antiplatelets: Secondary | ICD-10-CM

## 2021-03-07 DIAGNOSIS — D649 Anemia, unspecified: Secondary | ICD-10-CM | POA: Diagnosis present

## 2021-03-07 DIAGNOSIS — J189 Pneumonia, unspecified organism: Secondary | ICD-10-CM

## 2021-03-07 DIAGNOSIS — N1831 Chronic kidney disease, stage 3a: Secondary | ICD-10-CM | POA: Diagnosis present

## 2021-03-07 DIAGNOSIS — Z794 Long term (current) use of insulin: Secondary | ICD-10-CM

## 2021-03-07 DIAGNOSIS — Z79899 Other long term (current) drug therapy: Secondary | ICD-10-CM

## 2021-03-07 DIAGNOSIS — Z7982 Long term (current) use of aspirin: Secondary | ICD-10-CM

## 2021-03-07 DIAGNOSIS — A419 Sepsis, unspecified organism: Principal | ICD-10-CM | POA: Diagnosis present

## 2021-03-07 DIAGNOSIS — L97519 Non-pressure chronic ulcer of other part of right foot with unspecified severity: Secondary | ICD-10-CM | POA: Diagnosis present

## 2021-03-07 DIAGNOSIS — B3324 Viral cardiomyopathy: Secondary | ICD-10-CM | POA: Diagnosis present

## 2021-03-07 DIAGNOSIS — I10 Essential (primary) hypertension: Secondary | ICD-10-CM | POA: Diagnosis present

## 2021-03-07 DIAGNOSIS — J9600 Acute respiratory failure, unspecified whether with hypoxia or hypercapnia: Secondary | ICD-10-CM | POA: Diagnosis present

## 2021-03-07 DIAGNOSIS — I129 Hypertensive chronic kidney disease with stage 1 through stage 4 chronic kidney disease, or unspecified chronic kidney disease: Secondary | ICD-10-CM | POA: Diagnosis present

## 2021-03-07 DIAGNOSIS — E1165 Type 2 diabetes mellitus with hyperglycemia: Secondary | ICD-10-CM | POA: Diagnosis present

## 2021-03-07 DIAGNOSIS — D75838 Other thrombocytosis: Secondary | ICD-10-CM | POA: Diagnosis present

## 2021-03-07 DIAGNOSIS — D519 Vitamin B12 deficiency anemia, unspecified: Secondary | ICD-10-CM | POA: Diagnosis present

## 2021-03-07 DIAGNOSIS — D500 Iron deficiency anemia secondary to blood loss (chronic): Secondary | ICD-10-CM

## 2021-03-07 DIAGNOSIS — E1122 Type 2 diabetes mellitus with diabetic chronic kidney disease: Secondary | ICD-10-CM | POA: Diagnosis present

## 2021-03-07 DIAGNOSIS — Y95 Nosocomial condition: Secondary | ICD-10-CM | POA: Diagnosis present

## 2021-03-07 DIAGNOSIS — E11621 Type 2 diabetes mellitus with foot ulcer: Secondary | ICD-10-CM | POA: Diagnosis present

## 2021-03-07 DIAGNOSIS — J9601 Acute respiratory failure with hypoxia: Secondary | ICD-10-CM | POA: Diagnosis present

## 2021-03-07 DIAGNOSIS — Z8616 Personal history of COVID-19: Secondary | ICD-10-CM

## 2021-03-07 LAB — CBC WITH DIFFERENTIAL/PLATELET
Abs Immature Granulocytes: 0.1 10*3/uL — ABNORMAL HIGH (ref 0.00–0.07)
Basophils Absolute: 0.1 10*3/uL (ref 0.0–0.1)
Basophils Relative: 0 %
Eosinophils Absolute: 0.2 10*3/uL (ref 0.0–0.5)
Eosinophils Relative: 1 %
HCT: 25.2 % — ABNORMAL LOW (ref 39.0–52.0)
Hemoglobin: 7.6 g/dL — ABNORMAL LOW (ref 13.0–17.0)
Immature Granulocytes: 1 %
Lymphocytes Relative: 15 %
Lymphs Abs: 1.9 10*3/uL (ref 0.7–4.0)
MCH: 25.5 pg — ABNORMAL LOW (ref 26.0–34.0)
MCHC: 30.2 g/dL (ref 30.0–36.0)
MCV: 84.6 fL (ref 80.0–100.0)
Monocytes Absolute: 1.4 10*3/uL — ABNORMAL HIGH (ref 0.1–1.0)
Monocytes Relative: 11 %
Neutro Abs: 9.1 10*3/uL — ABNORMAL HIGH (ref 1.7–7.7)
Neutrophils Relative %: 72 %
Platelets: 439 10*3/uL — ABNORMAL HIGH (ref 150–400)
RBC: 2.98 MIL/uL — ABNORMAL LOW (ref 4.22–5.81)
RDW: 14.7 % (ref 11.5–15.5)
WBC: 12.6 10*3/uL — ABNORMAL HIGH (ref 4.0–10.5)
nRBC: 0 % (ref 0.0–0.2)

## 2021-03-07 LAB — COMPREHENSIVE METABOLIC PANEL
ALT: 15 U/L (ref 0–44)
AST: 17 U/L (ref 15–41)
Albumin: 3.4 g/dL — ABNORMAL LOW (ref 3.5–5.0)
Alkaline Phosphatase: 58 U/L (ref 38–126)
Anion gap: 9 (ref 5–15)
BUN: 12 mg/dL (ref 6–20)
CO2: 21 mmol/L — ABNORMAL LOW (ref 22–32)
Calcium: 8.6 mg/dL — ABNORMAL LOW (ref 8.9–10.3)
Chloride: 112 mmol/L — ABNORMAL HIGH (ref 98–111)
Creatinine, Ser: 1.45 mg/dL — ABNORMAL HIGH (ref 0.61–1.24)
GFR, Estimated: >60 mL/min (ref >60)
Glucose, Bld: 98 mg/dL (ref 70–99)
Potassium: 3.8 mmol/L (ref 3.5–5.1)
Sodium: 142 mmol/L (ref 135–145)
Total Bilirubin: 0.9 mg/dL (ref 0.3–1.2)
Total Protein: 6.9 g/dL (ref 6.5–8.1)

## 2021-03-07 LAB — TROPONIN I (HIGH SENSITIVITY)
Troponin I (High Sensitivity): 82 ng/L — ABNORMAL HIGH (ref <18)
Troponin I (High Sensitivity): 86 ng/L — ABNORMAL HIGH (ref <18)

## 2021-03-07 LAB — D-DIMER, QUANTITATIVE: D-Dimer, Quant: 0.87 ug/mL-FEU — ABNORMAL HIGH (ref 0.00–0.50)

## 2021-03-07 LAB — BRAIN NATRIURETIC PEPTIDE: B Natriuretic Peptide: 193.3 pg/mL — ABNORMAL HIGH (ref 0.0–100.0)

## 2021-03-07 LAB — LACTIC ACID, PLASMA: Lactic Acid, Venous: 1.3 mmol/L (ref 0.5–1.9)

## 2021-03-07 MED ORDER — VANCOMYCIN HCL 1500 MG/300ML IV SOLN
1500.0000 mg | Freq: Once | INTRAVENOUS | Status: AC
  Administered 2021-03-08: 1500 mg via INTRAVENOUS
  Filled 2021-03-07 (×2): qty 300

## 2021-03-07 MED ORDER — METHYLPREDNISOLONE SODIUM SUCC 125 MG IJ SOLR
125.0000 mg | Freq: Once | INTRAMUSCULAR | Status: AC
  Administered 2021-03-07: 125 mg via INTRAVENOUS
  Filled 2021-03-07: qty 2

## 2021-03-07 MED ORDER — ALBUTEROL SULFATE HFA 108 (90 BASE) MCG/ACT IN AERS
2.0000 | INHALATION_SPRAY | RESPIRATORY_TRACT | Status: DC | PRN
  Administered 2021-03-09: 2 via RESPIRATORY_TRACT
  Filled 2021-03-07 (×2): qty 6.7

## 2021-03-07 MED ORDER — VANCOMYCIN HCL IN DEXTROSE 1-5 GM/200ML-% IV SOLN
1000.0000 mg | Freq: Once | INTRAVENOUS | Status: AC
  Administered 2021-03-08: 1000 mg via INTRAVENOUS
  Filled 2021-03-07: qty 200

## 2021-03-07 MED ORDER — IPRATROPIUM-ALBUTEROL 0.5-2.5 (3) MG/3ML IN SOLN
3.0000 mL | Freq: Once | RESPIRATORY_TRACT | Status: AC
  Administered 2021-03-07: 3 mL via RESPIRATORY_TRACT
  Filled 2021-03-07: qty 3

## 2021-03-07 MED ORDER — IOHEXOL 350 MG/ML SOLN
100.0000 mL | Freq: Once | INTRAVENOUS | Status: AC | PRN
  Administered 2021-03-07: 100 mL via INTRAVENOUS

## 2021-03-07 MED ORDER — SODIUM CHLORIDE 0.9 % IV SOLN
2.0000 g | Freq: Three times a day (TID) | INTRAVENOUS | Status: DC
  Administered 2021-03-07 – 2021-03-10 (×8): 2 g via INTRAVENOUS
  Filled 2021-03-07 (×10): qty 2

## 2021-03-07 NOTE — ED Notes (Signed)
Patient transported to X-ray 

## 2021-03-07 NOTE — ED Notes (Signed)
Hospitalist at bedside to discuss plan of care. Patient c/o R foot ulcer recently noted. Per hosptialist, plan for pt to have wound care consult once admitted to the hospital.

## 2021-03-07 NOTE — ED Notes (Signed)
ED Provider at bedside discussing plan for admission and results of CT. Patient and mother both verbalize understanding and agree with plan.

## 2021-03-07 NOTE — ED Triage Notes (Signed)
Pt c/o SOB x 2-3 days, with productive cough. Denies N/V/D, fever. Hx of asthma. Reports using inhaler q6h at home today.

## 2021-03-07 NOTE — ED Notes (Signed)
Patient sats 90% on RA. Placed on 2L via [place] with improvement to 96%.

## 2021-03-07 NOTE — H&P (Signed)
History and Physical   Erik [name] DVV:[id number] DOB: 03/04/1991 DOA: 03/07/2021  Referring MD/NP/PA: Dr. Jari Pigg  PCP: Pcp, No   Outpatient Specialists: Dr. Saunders Revel, cardiology  Patient coming from: Home  Chief Complaint: Shortness of breath  HPI: Erik [name] is a 30 y.o. male with medical history significant of asthma, recently diagnosed diabetes, history of COVID-19 with cardiomyopathy about a month ago with left heart cath at the time, morbid obesity, ventricular tachycardia, hyperlipidemia, ARDS as well as AKI who was discharged from rehab on February 26, 2021.  Patient went home and was doing fine but came into the ER today with worsening shortness of breath.  Shortness of breath was both at rest and mild exertion.  Denied any PND orthopnea.  Denied any chest pain.  Patient was seen in the ER and appears to be mildly hypoxic.  No fever or chills.  Suspected to have CHF versus pneumonia based on CT on chest x-ray.  Patient had recent echocardiogram a month ago that showed normal EF.  At that time he also had left heart cath.  He is scheduled to see cardiology tomorrow in the office.  He is ruling in for sepsis with white count respiratory rate and tachycardia.  His temperature is also borderline.  At this point he appears to have sepsis due to pneumonia more than cardiac causes.  Will be admitted to the hospital with possible post Covid pneumonia..  ED Course: Temperature 99.5 blood pressure 150/88 pulse 160 respirate of 28 oxygen sats 92% on room air.  White count is 12.6 hemoglobin 7.6 and platelet 439.  Sodium 142 potassium 3.8 chloride 112 CO2 21 creatinine 1.45 and calcium 8.6.  Troponin is 86.  First 1 was 82.  Lactic acid 1.3.  BNP of 193.  Chest x-ray showed bilateral airspace opacities right greater than left probably pneumonia versus edema.  CT angiogram of the chest showed no PE but findings consistent with possible infiltrates again pneumonia versus CHF.  Patient will be admitted for acute  hypoxic respiratory failure with sepsis as a result of pneumonia.  Review of Systems: As per HPI otherwise 10 point review of systems negative.    Past Medical History:  Diagnosis Date  . Asthma   . Diabetes mellitus without complication [facility])     Past Surgical History:  Procedure Laterality Date  . LEFT HEART CATH AND CORONARY ANGIOGRAPHY N/A 01/20/2021   Procedure: LEFT HEART CATH AND CORONARY ANGIOGRAPHY;  Surgeon: Nelva Bush, MD;  Location: [facility];  Service: Cardiovascular;  Laterality: N/A;     reports that he has never smoked. He has never used smokeless tobacco. He reports that he does not drink alcohol and does not use drugs.  No Known Allergies  History reviewed. No pertinent family history.   Prior to Admission medications   Medication Sig Start Date End Date Taking? Authorizing Provider  albuterol (VENTOLIN HFA) 108 (90 Base) MCG/ACT inhaler Inhale 1-2 puffs into the lungs every 6 (six) hours as needed for wheezing or shortness of breath. 02/26/21   Love, Ivan Anchors, PA-C  amLODipine (NORVASC) 10 MG tablet Take 1 tablet (10 mg total) by mouth daily. 02/27/21   Love, Ivan Anchors, PA-C  aspirin 81 MG chewable tablet Chew 1 tablet (81 mg total) by mouth daily. 02/25/21   Love, Ivan Anchors, PA-C  atorvastatin (LIPITOR) 80 MG tablet Take 1 tablet (80 mg total) by mouth daily. 02/25/21   Love, Ivan Anchors, PA-C  blood glucose meter  kit and supplies KIT Dispense based on patient and insurance preference. Use up to four times daily as directed. 02/26/21   Love, Ivan Anchors, PA-C  clopidogrel (PLAVIX) 75 MG tablet Take 1 tablet (75 mg total) by mouth daily. 02/25/21   Love, Ivan Anchors, PA-C  collagenase (SANTYL) ointment Apply topically daily. Apply to yellow slough on breakdown area, cover with damp to dry dressing and change daily. 02/26/21   Love, Ivan Anchors, PA-C  dextromethorphan-guaiFENesin (MUCINEX DM) 30-600 MG 12hr tablet Take 1 tablet by mouth 2 (two) times daily as needed for  cough. 02/03/21   Lorella Nimrod, MD  docusate sodium (COLACE) 100 MG capsule Take 1 capsule (100 mg total) by mouth 2 (two) times daily as needed for mild constipation. 02/25/21   Love, Ivan Anchors, PA-C  famotidine (PEPCID) 20 MG tablet Take 1 tablet (20 mg total) by mouth daily. 02/25/21   Love, Ivan Anchors, PA-C  hydrALAZINE (APRESOLINE) 50 MG tablet Take 1 tablet (50 mg total) by mouth every 8 (eight) hours. 02/25/21   Love, Ivan Anchors, PA-C  insulin aspart (NOVOLOG) 100 UNIT/ML FlexPen Inject 8 Units into the skin 3 (three) times daily with meals. 02/25/21   Love, Ivan Anchors, PA-C  insulin detemir (LEVEMIR) 100 UNIT/ML FlexPen Inject 37 Units into the skin daily. 02/25/21   Love, Ivan Anchors, PA-C  Insulin Pen Needle 31G X 6 MM MISC 1 application by Does not apply route with breakfast, with lunch, and with evening meal. 02/25/21   Love, Ivan Anchors, PA-C  Menthol-Methyl Salicylate (MUSCLE RUB) 10-15 % CREA Apply 1 application topically 2 (two) times daily. 02/25/21   Love, Ivan Anchors, PA-C  metoprolol tartrate (LOPRESSOR) 100 MG tablet Take 1 tablet (100 mg total) by mouth 2 (two) times daily. 02/25/21   Love, Ivan Anchors, PA-C  Multiple Vitamin (MULTIVITAMIN WITH MINERALS) TABS tablet Take 1 tablet by mouth daily. 02/04/21   Lorella Nimrod, MD  nystatin-triamcinolone (MYCOLOG II) cream Apply topically 2 (two) times daily. Thin layer to skin folds 02/25/21   Love, Ivan Anchors, PA-C  polyethylene glycol (MIRALAX / GLYCOLAX) 17 g packet Take 17 g by mouth daily as needed for moderate constipation. 02/03/21   Lorella Nimrod, MD    Physical Exam: Vitals:   03/07/21 2014 03/07/21 2131 03/07/21 2230 03/08/21 0000  BP:  132/63 (!) 142/78 135/74  Pulse:  (!) 113 (!) 116 (!) 112  Resp:  (!) 28 (!) 26 17  Temp:  99.5 F (37.5 C)    TempSrc:  Oral    SpO2:  100% 94% 98%  Weight: (!) 176 kg     Height: '5\' 10"'  (1.778 m)         Constitutional: Pleasant, morbidly obese, no distress Vitals:   03/07/21 2014 03/07/21 2131 03/07/21  2230 03/08/21 0000  BP:  132/63 (!) 142/78 135/74  Pulse:  (!) 113 (!) 116 (!) 112  Resp:  (!) 28 (!) 26 17  Temp:  99.5 F (37.5 C)    TempSrc:  Oral    SpO2:  100% 94% 98%  Weight: (!) 176 kg     Height: '5\' 10"'  (1.778 m)      Eyes: PERRL, lids and conjunctivae normal ENMT: Mucous membranes are moist. Posterior pharynx clear of any exudate or lesions.Normal dentition.  Neck: normal, supple, no masses, no thyromegaly Respiratory: Coarse breath sound bilaterally with some crackles, no significant wheeze normal respiratory effort. No accessory muscle use.  Cardiovascular: Sinus tachycardia, no murmurs / rubs /  gallops. No extremity edema. 2+ pedal pulses. No carotid bruits.  Abdomen: no tenderness, no masses palpated. No hepatosplenomegaly. Bowel sounds positive.  Musculoskeletal: no clubbing / cyanosis. No joint deformity upper and lower extremities. Good ROM, no contractures. Normal muscle tone.  Right foot ulcer underneath the fourth and fifth digit, superficial, no drainage Skin: Foot ulcer as above on the right Neurologic: CN 2-12 grossly intact. Sensation intact, DTR normal. Strength 5/5 in all 4.  Psychiatric: Normal judgment and insight. Alert and oriented x 3. Normal mood.     Labs on Admission: I have personally reviewed following labs and imaging studies  CBC: Recent Labs  Lab 03/07/21 2045  WBC 12.6*  NEUTROABS 9.1*  HGB 7.6*  HCT 25.2*  MCV 84.6  PLT 109*   Basic Metabolic Panel: Recent Labs  Lab 03/07/21 2045  NA 142  K 3.8  CL 112*  CO2 21*  GLUCOSE 98  BUN 12  CREATININE 1.45*  CALCIUM 8.6*   GFR: Estimated Creatinine Clearance: 121.4 mL/min (A) (by C-G formula based on SCr of 1.45 mg/dL (H)). Liver Function Tests: Recent Labs  Lab 03/07/21 2045  AST 17  ALT 15  ALKPHOS 58  BILITOT 0.9  PROT 6.9  ALBUMIN 3.4*   No results for input(s): LIPASE, AMYLASE in the last 168 hours. No results for input(s): AMMONIA in the last 168  hours. Coagulation Profile: No results for input(s): INR, PROTIME in the last 168 hours. Cardiac Enzymes: No results for input(s): CKTOTAL, CKMB, CKMBINDEX, TROPONINI in the last 168 hours. BNP (last 3 results) No results for input(s): PROBNP in the last 8760 hours. HbA1C: No results for input(s): HGBA1C in the last 72 hours. CBG: No results for input(s): GLUCAP in the last 168 hours. Lipid Profile: No results for input(s): CHOL, HDL, LDLCALC, TRIG, CHOLHDL, LDLDIRECT in the last 72 hours. Thyroid Function Tests: No results for input(s): TSH, T4TOTAL, FREET4, T3FREE, THYROIDAB in the last 72 hours. Anemia Panel: No results for input(s): VITAMINB12, FOLATE, FERRITIN, TIBC, IRON, RETICCTPCT in the last 72 hours. Urine analysis:    Component Value Date/Time   COLORURINE AMBER (A) 01/13/2021 1046   APPEARANCEUR TURBID (A) 01/13/2021 1046   LABSPEC 1.027 01/13/2021 1046   PHURINE 5.0 01/13/2021 1046   GLUCOSEU 50 (A) 01/13/2021 1046   HGBUR SMALL (A) 01/13/2021 1046   BILIRUBINUR NEGATIVE 01/13/2021 1046   KETONESUR 5 (A) 01/13/2021 1046   PROTEINUR 30 (A) 01/13/2021 1046   NITRITE NEGATIVE 01/13/2021 1046   LEUKOCYTESUR TRACE (A) 01/13/2021 1046   Sepsis Labs: '@LABRCNTIP' (procalcitonin:4,lacticidven:4) )No results found for this or any previous visit (from the past 240 hour(s)).   Radiological Exams on Admission: DG Chest 2 View  Result Date: 03/07/2021 CLINICAL DATA:  Shortness of breath for 2-3 days EXAM: CHEST - 2 VIEW COMPARISON:  01/19/2021 FINDINGS: Previously seen tubes and lines have been removed in the interval. Cardiac shadow is stable. Bilateral perihilar opacities are noted right considerably greater than left likely related to pneumonic infiltrate or effusion. IMPRESSION: Bilateral airspace opacities right greater than left likely related to multifocal pneumonia or edema. Electronically Signed   By: Inez Catalina M.D.   On: 03/07/2021 21:08   CT Angio Chest PE W and/or  Wo Contrast  Result Date: 03/07/2021 CLINICAL DATA:  No definite central pulmonary embolus. Markedly limited evaluation due to timing of contrast. EXAM: CT ANGIOGRAPHY CHEST WITH CONTRAST TECHNIQUE: Multidetector CT imaging of the chest was performed using the standard protocol during bolus administration of intravenous  contrast. Multiplanar CT image reconstructions and MIPs were obtained to evaluate the vascular anatomy. CONTRAST:  116m OMNIPAQUE IOHEXOL 350 MG/ML SOLN COMPARISON:  Chest x-ray 03/07/2021 FINDINGS: Cardiovascular: Poor opacification of the pulmonary arteries to the segmental level. No evidence of pulmonary embolism. The main pulmonary artery is normal in caliber. Normal heart size. Trace pericardial effusion. The thoracic aorta is normal in caliber. No atherosclerotic plaque of the thoracic aorta. No coronary artery calcifications. Mediastinum/Nodes: Multiple prominent mediastinal and likely hilar lymph nodes. No enlarged mediastinal, hilar, or axillary lymph nodes. Thyroid gland, trachea, and esophagus demonstrate no significant findings. Lungs/Pleura: Diffuse, right greater than left, upper lobe predominant peribronchovascular airspace opacities. Bilateral trace to small volume pleural effusion. Upper Abdomen: No acute abnormality. Musculoskeletal: No chest wall abnormality. No acute or significant osseous findings. Review of the MIP images confirms the above findings. IMPRESSION: 1. No definite central pulmonary embolus with markedly limited evaluation distally due to timing of contrast and respiratory motion artifact. 2. Diffuse peribronchovascular airspace opacities. Finding likely represents pulmonary edema versus infection/inflammation. Differential diagnosis also includes less likely alveolar hemorrhage given upper lobe predominance. 3. Bilateral trace to small volume pleural effusions. Electronically Signed   By: MIven FinnM.D.   On: 03/07/2021 22:30    EKG: Independently  reviewed.  Sinus tachycardia with a rate of 110, low voltage EKG.  Assessment/Plan Principal Problem:   Acute respiratory failure with hypoxia (HCC) Active Problems:   Obesity, Class III, BMI 40-49.9 (morbid obesity) ([place]   Dyslipidemia   Essential hypertension   Diabetes mellitus, new onset ([place]   Sepsis ([place]   Acute respiratory failure (HCC)   Anemia   Right foot ulcer ([place]     #1 acute hypoxic respiratory failure: Most likely secondary to healthcare associated pneumonia.  Mild fluid overload also possible.  Has diastolic dysfunction.  Probably pulmonary hypertension but had left heart cath recently.  We will treat underlying pneumonia but also diurese as necessary.  Consider cardiology consultation in the morning since patient has outpatient appointment tomorrow anyway.  #2 sepsis: Secondary to healthcare associated pneumonia.  Due to suspected fluid overload we will not do fluid boluses.  May likely go to progressive bed.  #3 Healthcare associated pneumonia: We will cover with relevant antibiotics including cefepime and vancomycin.  Monitor closely.  #4 cardiomyopathy: Related to Covid infection recently.  Patient recovering.  Will not repeat echo at this point.  May consider cardiology consultation in the morning.  #5 morbid obesity: Counseling provided.  #6 diabetes: Sliding scale insulin.  Confirm and continue home regimen.  #7 right foot diabetic ulcer: Early and may be superficial at this point.  Wound care only.  He is on antibiotics so we will monitor  #8 history of asthma: Does not appear to have exacerbation.  Continue monitoring  #9 anemia: Hemoglobin is 7.6.  May be responsible for his fluid overload.  Will monitor H&H and if it drops further may have to transfuse PRBC.  #10 AKI: Monitor renal function especially with diuresis   DVT prophylaxis: Lovenox Code Status: Full code Family Communication: Mother at bedside Disposition Plan: Home Consults called:  None but may consult cardiology in the morning Admission status: Inpatient  Severity of Illness: The appropriate patient status for this patient is INPATIENT. Inpatient status is judged to be reasonable and necessary in order to provide the required intensity of service to ensure the patient's safety. The patient's presenting symptoms, physical exam findings, and initial radiographic and laboratory data in the context  of their chronic comorbidities is felt to place them at high risk for further clinical deterioration. Furthermore, it is not anticipated that the patient will be medically stable for discharge from the hospital within 2 midnights of admission. The following factors support the patient status of inpatient.   " The patient's presenting symptoms include shortness of breath. " The worrisome physical exam findings include morbid obesity. " The initial radiographic and laboratory data are worrisome because of evidence of pneumonia versus CHF. " The chronic co-morbidities include diabetes.   * I certify that at the point of admission it is my clinical judgment that the patient will require inpatient hospital care spanning beyond 2 midnights from the point of admission due to high intensity of service, high risk for further deterioration and high frequency of surveillance required.Barbette Merino MD [facility] Pager [phone]  If 7PM-7AM, please contact night-coverage [email or web address] Password [email or web address]  03/08/2021, 12:12 AM

## 2021-03-07 NOTE — Progress Notes (Addendum)
Pharmacy Antibiotic Note  Erik [name] is a 30 y.o. male admitted on 03/07/2021 with HCAP.  Pharmacy has been consulted for Cefepime and Vancomycin dosing.  Plan: Ordered Cefepime 2 gm q8h per indication and renal fxn.  Vancomycin: Ordered LD of Vanc 2500 mg per pt wt: 176 kg Vancomycin 1250mg  q12h Goal AUC 400-550. Expected AUC: 470.9 SCr used: 1.67, Vd used: 0.5  Pharmacy will continue to follow SCr and order Vanc lvls (if needed) and adjust abx dosing if warranted.  Height: 5\' 10"  (177.8 cm) Weight: (!) 176 kg (388 lb) IBW/kg (Calculated) : 73  Temp (24hrs), Avg:99.5 F (37.5 C), Min:99.5 F (37.5 C), Max:99.5 F (37.5 C)  Recent Labs  Lab 03/07/21 2045 03/07/21 2225  WBC 12.6*  --   CREATININE 1.45*  --   LATICACIDVEN  --  1.3    Estimated Creatinine Clearance: 121.4 mL/min (A) (by C-G formula based on SCr of 1.45 mg/dL (H)).    No Known Allergies  Antimicrobials this admission: 03/27 Cefepime >> 03/28 Vancomycin >>   Microbiology results: 03/27 BCx: Pending  Thank you for allowing pharmacy to be a part of this patient's care.  4/28, PharmD, [facility] 03/07/2021 11:16 PM

## 2021-03-07 NOTE — ED Notes (Signed)
Patient transported to CT 

## 2021-03-07 NOTE — ED Provider Notes (Signed)
[facility] Emergency Department Provider Note  ____________________________________________   Event Date/Time   First MD Initiated Contact with Patient 03/07/21 2028     (approximate)  I have reviewed the triage vital signs and the nursing notes.   HISTORY  Chief Complaint Shortness of Breath    HPI Erik [name] is a 30 y.o. male with diabetes, asthma who comes in with shortness of breath.  Patient states that over the last few days has had some worsening shortness of breath, constant, nothing makes it better, nothing makes it worse.  He states that it does hurt when he takes a deep breath.   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 Asthma   . Diabetes mellitus without complication [facility])     Patient Active Problem List   Diagnosis Date Noted  . Essential hypertension   . Diabetes mellitus, new onset ([place])   . AKI (acute kidney injury) ([place])   . Debility 02/03/2021  . Super obesity   . Dyslipidemia   . VT (ventricular tachycardia) ([place])   . Elevated troponin   . Acute respiratory failure with hypoxia ([place])   . ST elevation myocardial infarction (STEMI) ([place])   . Acute hypoxemic respiratory failure due to COVID-19 ([place]) 01/12/2021  . DKA (diabetic ketoacidosis) ([place]) 01/12/2021  . Obesity, Class III, BMI 40-49.9 (morbid obesity) ([place]) 01/12/2021  . Acute metabolic encephalopathy 41/93/7902  . ARDS (adult respiratory distress syndrome) ([place]) 01/12/2021    Past Surgical History:  Procedure Laterality Date  . LEFT HEART CATH AND CORONARY ANGIOGRAPHY N/A 01/20/2021   Procedure: LEFT HEART CATH AND CORONARY ANGIOGRAPHY;  Surgeon: Nelva Bush, MD;  Location: [facility];  Service: Cardiovascular;  Laterality: N/A;    Prior to  Admission medications   Medication Sig Start Date End Date Taking? Authorizing Provider  albuterol (VENTOLIN HFA) 108 (90 Base) MCG/ACT inhaler Inhale 1-2 puffs into the lungs every 6 (six) hours as needed for wheezing or shortness of breath. 02/26/21   Love, Ivan Anchors, PA-C  amLODipine (NORVASC) 10 MG tablet Take 1 tablet (10 mg total) by mouth daily. 02/27/21   Love, Ivan Anchors, PA-C  aspirin 81 MG chewable tablet Chew 1 tablet (81 mg total) by mouth daily. 02/25/21   Love, Ivan Anchors, PA-C  atorvastatin (LIPITOR) 80 MG tablet Take 1 tablet (80 mg total) by mouth daily. 02/25/21   Love, Ivan Anchors, PA-C  blood glucose meter kit and supplies KIT Dispense based on patient and insurance preference. Use up to four times daily as directed. 02/26/21   Love, Ivan Anchors, PA-C  clopidogrel (PLAVIX) 75 MG tablet Take 1 tablet (75 mg total) by mouth daily. 02/25/21   Love, Ivan Anchors, PA-C  collagenase (SANTYL) ointment Apply topically daily. Apply to yellow slough on breakdown area, cover with damp to dry dressing and change daily. 02/26/21   Love, Ivan Anchors, PA-C  dextromethorphan-guaiFENesin (MUCINEX DM) 30-600 MG 12hr tablet Take 1 tablet by mouth 2 (two) times daily as needed for cough. 02/03/21   Lorella Nimrod, MD  docusate sodium (COLACE) 100 MG capsule Take 1 capsule (100 mg total) by mouth 2 (two) times daily as needed for mild constipation. 02/25/21   Love, Ivan Anchors, PA-C  famotidine (PEPCID)  20 MG tablet Take 1 tablet (20 mg total) by mouth daily. 02/25/21   Love, Ivan Anchors, PA-C  hydrALAZINE (APRESOLINE) 50 MG tablet Take 1 tablet (50 mg total) by mouth every 8 (eight) hours. 02/25/21   Love, Ivan Anchors, PA-C  insulin aspart (NOVOLOG) 100 UNIT/ML FlexPen Inject 8 Units into the skin 3 (three) times daily with meals. 02/25/21   Love, Ivan Anchors, PA-C  insulin detemir (LEVEMIR) 100 UNIT/ML FlexPen Inject 37 Units into the skin daily. 02/25/21   Love, Ivan Anchors, PA-C  Insulin Pen Needle 31G X 6 MM MISC 1 application by Does not  apply route with breakfast, with lunch, and with evening meal. 02/25/21   Love, Ivan Anchors, PA-C  Menthol-Methyl Salicylate (MUSCLE RUB) 10-15 % CREA Apply 1 application topically 2 (two) times daily. 02/25/21   Love, Ivan Anchors, PA-C  metoprolol tartrate (LOPRESSOR) 100 MG tablet Take 1 tablet (100 mg total) by mouth 2 (two) times daily. 02/25/21   Love, Ivan Anchors, PA-C  Multiple Vitamin (MULTIVITAMIN WITH MINERALS) TABS tablet Take 1 tablet by mouth daily. 02/04/21   Lorella Nimrod, MD  nystatin-triamcinolone (MYCOLOG II) cream Apply topically 2 (two) times daily. Thin layer to skin folds 02/25/21   Love, Ivan Anchors, PA-C  polyethylene glycol (MIRALAX / GLYCOLAX) 17 g packet Take 17 g by mouth daily as needed for moderate constipation. 02/03/21   Lorella Nimrod, MD    Allergies Patient has no known allergies.  History reviewed. No pertinent family history.  Social History Social History   Tobacco Use  . Smoking status: Never Smoker  . Smokeless tobacco: Never Used  Substance Use Topics  . Alcohol use: Never  . Drug use: Never      Review of Systems Constitutional: No fever/chills Eyes: No visual changes. ENT: No sore throat. Cardiovascular: Positive chest pain Respiratory: Positive for SOB Gastrointestinal: No abdominal pain.  No nausea, no vomiting.  No diarrhea.  No constipation. Genitourinary: Negative for dysuria. Musculoskeletal: Negative for back pain. Skin: Negative for rash. Neurological: Negative for headaches, focal weakness or numbness. All other ROS negative ____________________________________________   PHYSICAL EXAM:  VITAL SIGNS: ED Triage Vitals  Enc Vitals Group     BP 03/07/21 2012 (!) 152/88     Pulse Rate 03/07/21 2012 84     Resp 03/07/21 2012 (!) 26     Temp 03/07/21 2012 99.5 F (37.5 C)     Temp Source 03/07/21 2012 Oral     SpO2 03/07/21 2012 92 %     Weight 03/07/21 2014 (!) 388 lb (176 kg)     Height 03/07/21 2014 '5\' 10"'  (1.778 m)     Head  Circumference --      Peak Flow --      Pain Score 03/07/21 2015 0     Pain Loc --      Pain Edu? --      Excl. in [place]? --     Constitutional: Alert and oriented.  Elevated BMI Eyes: Conjunctivae are normal. EOMI. Head: Atraumatic. Nose: No congestion/rhinnorhea. Mouth/Throat: Mucous membranes are moist.   Neck: No stridor. Trachea Midline. FROM Cardiovascular: Tachycardic, regular rhythm. Grossly normal heart sounds.  Good peripheral circulation. Respiratory: Coarse breath sounds bilaterally, increased work of breathing Gastrointestinal: Soft and nontender. No distention. No abdominal bruits.  Musculoskeletal: No lower extremity tenderness nor edema.  No joint effusions. Neurologic:  Normal speech and language. No gross focal neurologic deficits are appreciated.  Skin:  Skin is warm, dry and  intact. No rash noted. Psychiatric: Mood and affect are normal. Speech and behavior are normal. GU: Deferred   ____________________________________________   LABS (all labs ordered are listed, but only abnormal results are displayed)  Labs Reviewed  CBC WITH DIFFERENTIAL/PLATELET - Abnormal; Notable for the following components:      Result Value   WBC 12.6 (*)    RBC 2.98 (*)    Hemoglobin 7.6 (*)    HCT 25.2 (*)    MCH 25.5 (*)    Platelets 439 (*)    Neutro Abs 9.1 (*)    Monocytes Absolute 1.4 (*)    Abs Immature Granulocytes 0.10 (*)    All other components within normal limits  D-DIMER, QUANTITATIVE - Abnormal; Notable for the following components:   D-Dimer, Quant 0.87 (*)    All other components within normal limits  BRAIN NATRIURETIC PEPTIDE - Abnormal; Notable for the following components:   B Natriuretic Peptide 193.3 (*)    All other components within normal limits  COMPREHENSIVE METABOLIC PANEL  URINALYSIS, COMPLETE (UACMP) WITH MICROSCOPIC  TROPONIN I (HIGH SENSITIVITY)   ____________________________________________   ED ECG REPORT I, Vanessa Pickens, the  attending physician, personally viewed and interpreted this ECG.  Sinus tachycardia rate of 110, no ST elevation, T wave version in lead III S1 e q3 T3, normal intervals ____________________________________________  RADIOLOGY Robert Bellow, personally viewed and evaluated these images (plain radiographs) as part of my medical decision making, as well as reviewing the written report by the radiologist.  ED MD interpretation: Bilateral opacifications  Official radiology report(s): DG Chest 2 View  Result Date: 03/07/2021 CLINICAL DATA:  Shortness of breath for 2-3 days EXAM: CHEST - 2 VIEW COMPARISON:  01/19/2021 FINDINGS: Previously seen tubes and lines have been removed in the interval. Cardiac shadow is stable. Bilateral perihilar opacities are noted right considerably greater than left likely related to pneumonic infiltrate or effusion. IMPRESSION: Bilateral airspace opacities right greater than left likely related to multifocal pneumonia or edema. Electronically Signed   By: Inez Catalina M.D.   On: 03/07/2021 21:08    ____________________________________________   PROCEDURES  Procedure(s) performed (including Critical Care):  .Critical Care Performed by: Vanessa [place], MD Authorized by: Vanessa [place], MD   Critical care provider statement:    Critical care time (minutes):  45   Critical care was necessary to treat or prevent imminent or life-threatening deterioration of the following conditions:  Respiratory failure   Critical care was time spent personally by me on the following activities:  Discussions with consultants, evaluation of patient's response to treatment, examination of patient, ordering and performing treatments and interventions, ordering and review of laboratory studies, ordering and review of radiographic studies, pulse oximetry, re-evaluation of patient's condition, obtaining history from patient or surrogate and review of old charts .1-3 Lead EKG  Interpretation Performed by: Vanessa [place], MD Authorized by: Vanessa [place], MD     Interpretation: abnormal     ECG rate:  110s   ECG rate assessment: tachycardic     Rhythm: sinus rhythm     Ectopy: none     Conduction: normal       ____________________________________________   INITIAL IMPRESSION / ASSESSMENT AND PLAN / ED COURSE   Erik [name] was evaluated in Emergency Department on 03/07/2021 for the symptoms described in the history of present illness. He was evaluated in the context of the global COVID-19 pandemic, which necessitated consideration that the patient might  be at risk for infection with the SARS-CoV-2 virus that causes COVID-19. Institutional protocols and algorithms that pertain to the evaluation of patients at risk for COVID-19 are in a state of rapid change based on information released by regulatory bodies including the CDC and federal and state organizations. These policies and algorithms were followed during the patient's care in the ED.     Pt presents with SOB. Differential includes:  PNA-will get xray to evaluation Anemia-CBC to evaluate ACS- will get trops Arrhythmia-Will get EKG and keep on monitor.  COVID- will get testing per algorithm. PE-consider given recent COVID and tachycardia Asthma, just sounds really coarse but could be contributing so we will give some duo nebs and steroids  Patient's D-dimer is elevated so we will proceed with CT PE  Pt CT with out PE but possible multifocal PNA- pt 89-90% on RA so placed on 3L and now 94%.  Will start broad spectrum antibiotics. Difficult to access fluid status due to [facility] have component of pulm edema. Holding on fluids.             ____________________________________________   FINAL CLINICAL IMPRESSION(S) / ED DIAGNOSES   Final diagnoses:  Acute respiratory failure with hypoxia ([place])  Pneumonia of both lungs due to infectious organism, unspecified part of lung     MEDICATIONS  GIVEN DURING THIS VISIT:  Medications  albuterol (VENTOLIN HFA) 108 (90 Base) MCG/ACT inhaler 2 puff (has no administration in time range)  heparin injection 5,000 Units (5,000 Units Subcutaneous Given 03/08/21 0620)  acetaminophen (TYLENOL) tablet 650 mg (has no administration in time range)    Or  acetaminophen (TYLENOL) suppository 650 mg (has no administration in time range)  ondansetron (ZOFRAN) tablet 4 mg (has no administration in time range)    Or  ondansetron (ZOFRAN) injection 4 mg (has no administration in time range)  insulin aspart (novoLOG) injection 0-20 Units (7 Units Subcutaneous Given 03/08/21 1157)  insulin aspart (novoLOG) injection 0-5 Units (has no administration in time range)  ceFEPIme (MAXIPIME) 2 g in sodium chloride 0.9 % 100 mL IVPB (2 g Intravenous New Bag/Given 03/08/21 0619)  metoprolol tartrate (LOPRESSOR) tablet 100 mg (100 mg Oral Given 03/08/21 0825)  amLODipine (NORVASC) tablet 10 mg (10 mg Oral Given 03/08/21 0825)  aspirin chewable tablet 81 mg (81 mg Oral Given 03/08/21 0825)  atorvastatin (LIPITOR) tablet 80 mg (has no administration in time range)  clopidogrel (PLAVIX) tablet 75 mg (75 mg Oral Given 03/08/21 0825)  famotidine (PEPCID) tablet 20 mg (20 mg Oral Given 03/08/21 0825)  hydrALAZINE (APRESOLINE) tablet 50 mg (50 mg Oral Given 03/08/21 0620)  polyethylene glycol (MIRALAX / GLYCOLAX) packet 17 g (has no administration in time range)  vancomycin (VANCOREADY) IVPB 1250 mg/250 mL (has no administration in time range)  ipratropium-albuterol (DUONEB) 0.5-2.5 (3) MG/3ML nebulizer solution 3 mL (3 mLs Nebulization Given 03/07/21 2058)  ipratropium-albuterol (DUONEB) 0.5-2.5 (3) MG/3ML nebulizer solution 3 mL (3 mLs Nebulization Given 03/07/21 2059)  ipratropium-albuterol (DUONEB) 0.5-2.5 (3) MG/3ML nebulizer solution 3 mL (3 mLs Nebulization Given 03/07/21 2058)  methylPREDNISolone sodium succinate (SOLU-MEDROL) 125 mg/2 mL injection 125 mg (125 mg Intravenous  Given 03/07/21 2132)  iohexol (OMNIPAQUE) 350 MG/ML injection 100 mL (100 mLs Intravenous Contrast Given 03/07/21 2152)  vancomycin (VANCOCIN) IVPB 1000 mg/200 mL premix (0 mg Intravenous Stopped 03/08/21 0127)    Followed by  vancomycin (VANCOREADY) IVPB 1500 mg/300 mL (1,500 mg Intravenous New Bag/Given 03/08/21 0311)  magnesium sulfate IVPB 2 g 50 mL (  2 g Intravenous New Bag/Given 03/08/21 0504)  furosemide (LASIX) injection 60 mg (60 mg Intravenous Given 03/08/21 0305)     ED Discharge Orders    None       Note:  This document was prepared using Dragon voice recognition software and may include unintentional dictation errors.   Vanessa [place], MD 03/08/21 1200

## 2021-03-08 ENCOUNTER — Ambulatory Visit: Payer: Self-pay | Admitting: Family

## 2021-03-08 ENCOUNTER — Ambulatory Visit: Payer: Self-pay | Admitting: Occupational Therapy

## 2021-03-08 DIAGNOSIS — D519 Vitamin B12 deficiency anemia, unspecified: Secondary | ICD-10-CM | POA: Diagnosis present

## 2021-03-08 DIAGNOSIS — A419 Sepsis, unspecified organism: Principal | ICD-10-CM

## 2021-03-08 DIAGNOSIS — D649 Anemia, unspecified: Secondary | ICD-10-CM | POA: Diagnosis present

## 2021-03-08 DIAGNOSIS — J9600 Acute respiratory failure, unspecified whether with hypoxia or hypercapnia: Secondary | ICD-10-CM | POA: Diagnosis present

## 2021-03-08 DIAGNOSIS — J189 Pneumonia, unspecified organism: Secondary | ICD-10-CM

## 2021-03-08 DIAGNOSIS — L97519 Non-pressure chronic ulcer of other part of right foot with unspecified severity: Secondary | ICD-10-CM | POA: Diagnosis present

## 2021-03-08 LAB — CBC
HCT: 27.3 % — ABNORMAL LOW (ref 39.0–52.0)
Hemoglobin: 8.4 g/dL — ABNORMAL LOW (ref 13.0–17.0)
MCH: 25.9 pg — ABNORMAL LOW (ref 26.0–34.0)
MCHC: 30.8 g/dL (ref 30.0–36.0)
MCV: 84.3 fL (ref 80.0–100.0)
Platelets: 428 10*3/uL — ABNORMAL HIGH (ref 150–400)
RBC: 3.24 MIL/uL — ABNORMAL LOW (ref 4.22–5.81)
RDW: 14.7 % (ref 11.5–15.5)
WBC: 12.3 10*3/uL — ABNORMAL HIGH (ref 4.0–10.5)
nRBC: 0 % (ref 0.0–0.2)

## 2021-03-08 LAB — PROTIME-INR
INR: 1.2 (ref 0.8–1.2)
Prothrombin Time: 14.4 seconds (ref 11.4–15.2)

## 2021-03-08 LAB — IRON AND TIBC
Iron: 16 ug/dL — ABNORMAL LOW (ref 45–182)
Saturation Ratios: 6 % — ABNORMAL LOW (ref 17.9–39.5)
TIBC: 270 ug/dL (ref 250–450)
UIBC: 254 ug/dL

## 2021-03-08 LAB — GLUCOSE, CAPILLARY
Glucose-Capillary: 270 mg/dL — ABNORMAL HIGH (ref 70–99)
Glucose-Capillary: 273 mg/dL — ABNORMAL HIGH (ref 70–99)
Glucose-Capillary: 227 mg/dL — ABNORMAL HIGH (ref 70–99)
Glucose-Capillary: 232 mg/dL — ABNORMAL HIGH (ref 70–99)
Glucose-Capillary: 199 mg/dL — ABNORMAL HIGH (ref 70–99)

## 2021-03-08 LAB — COMPREHENSIVE METABOLIC PANEL
ALT: 11 U/L (ref 0–44)
AST: 10 U/L — ABNORMAL LOW (ref 15–41)
Albumin: 3.3 g/dL — ABNORMAL LOW (ref 3.5–5.0)
Alkaline Phosphatase: 66 U/L (ref 38–126)
Anion gap: 11 (ref 5–15)
BUN: 14 mg/dL (ref 6–20)
CO2: 19 mmol/L — ABNORMAL LOW (ref 22–32)
Calcium: 8.7 mg/dL — ABNORMAL LOW (ref 8.9–10.3)
Chloride: 107 mmol/L (ref 98–111)
Creatinine, Ser: 1.67 mg/dL — ABNORMAL HIGH (ref 0.61–1.24)
GFR, Estimated: 56 mL/min — ABNORMAL LOW (ref >60)
Glucose, Bld: 229 mg/dL — ABNORMAL HIGH (ref 70–99)
Potassium: 3.7 mmol/L (ref 3.5–5.1)
Sodium: 137 mmol/L (ref 135–145)
Total Bilirubin: 0.7 mg/dL (ref 0.3–1.2)
Total Protein: 7.5 g/dL (ref 6.5–8.1)

## 2021-03-08 LAB — LACTIC ACID, PLASMA: Lactic Acid, Venous: 0.8 mmol/L (ref 0.5–1.9)

## 2021-03-08 LAB — MRSA PCR SCREENING: MRSA by PCR: NEGATIVE

## 2021-03-08 LAB — PROCALCITONIN
Procalcitonin: 0.31 ng/mL
Procalcitonin: 0.32 ng/mL

## 2021-03-08 LAB — CBG MONITORING, ED: Glucose-Capillary: 135 mg/dL — ABNORMAL HIGH (ref 70–99)

## 2021-03-08 LAB — VITAMIN B12: Vitamin B-12: 136 pg/mL — ABNORMAL LOW (ref 180–914)

## 2021-03-08 LAB — MAGNESIUM: Magnesium: 1.6 mg/dL — ABNORMAL LOW (ref 1.7–2.4)

## 2021-03-08 LAB — CORTISOL-AM, BLOOD: Cortisol - AM: 5.3 ug/dL — ABNORMAL LOW (ref 6.7–22.6)

## 2021-03-08 MED ORDER — VANCOMYCIN HCL 1000 MG/200ML IV SOLN: 1000.0000 mg | Freq: Once | INTRAVENOUS | Status: DC

## 2021-03-08 MED ORDER — COLLAGENASE 250 UNIT/GM EX OINT
TOPICAL_OINTMENT | Freq: Every day | CUTANEOUS | Status: DC
  Filled 2021-03-08: qty 30

## 2021-03-08 MED ORDER — SODIUM CHLORIDE 0.9 % IV SOLN: 2.0000 g | Freq: Once | INTRAVENOUS | Status: DC

## 2021-03-08 MED ORDER — ACETAMINOPHEN 325 MG PO TABS: 650.0000 mg | ORAL_TABLET | Freq: Four times a day (QID) | ORAL | Status: DC | PRN

## 2021-03-08 MED ORDER — HYDRALAZINE HCL 50 MG PO TABS
50.0000 mg | ORAL_TABLET | Freq: Three times a day (TID) | ORAL | Status: DC
  Administered 2021-03-08 – 2021-03-10 (×7): 50 mg via ORAL
  Filled 2021-03-08 (×7): qty 1

## 2021-03-08 MED ORDER — HEPARIN SODIUM (PORCINE) 5000 UNIT/ML IJ SOLN
5000.0000 [IU] | Freq: Three times a day (TID) | INTRAMUSCULAR | Status: DC
  Administered 2021-03-08 – 2021-03-10 (×7): 5000 [IU] via SUBCUTANEOUS
  Filled 2021-03-08 (×7): qty 1

## 2021-03-08 MED ORDER — FAMOTIDINE 20 MG PO TABS
20.0000 mg | ORAL_TABLET | Freq: Every day | ORAL | Status: DC
  Administered 2021-03-08 – 2021-03-10 (×3): 20 mg via ORAL
  Filled 2021-03-08 (×3): qty 1

## 2021-03-08 MED ORDER — METOPROLOL TARTRATE 50 MG PO TABS
100.0000 mg | ORAL_TABLET | Freq: Two times a day (BID) | ORAL | Status: DC
  Administered 2021-03-08 – 2021-03-10 (×6): 100 mg via ORAL
  Filled 2021-03-08 (×6): qty 2

## 2021-03-08 MED ORDER — INSULIN ASPART 100 UNIT/ML ~~LOC~~ SOLN
0.0000 [IU] | Freq: Three times a day (TID) | SUBCUTANEOUS | Status: DC
  Administered 2021-03-08: 11 [IU] via SUBCUTANEOUS
  Administered 2021-03-08 – 2021-03-09 (×3): 7 [IU] via SUBCUTANEOUS
  Administered 2021-03-09 (×2): 4 [IU] via SUBCUTANEOUS
  Administered 2021-03-10: 3 [IU] via SUBCUTANEOUS
  Filled 2021-03-08 (×7): qty 1

## 2021-03-08 MED ORDER — ONDANSETRON HCL 4 MG/2ML IJ SOLN: 4.0000 mg | Freq: Four times a day (QID) | INTRAMUSCULAR | Status: DC | PRN

## 2021-03-08 MED ORDER — ACETAMINOPHEN 650 MG RE SUPP: 650.0000 mg | Freq: Four times a day (QID) | RECTAL | Status: DC | PRN

## 2021-03-08 MED ORDER — ASPIRIN 81 MG PO CHEW
81.0000 mg | CHEWABLE_TABLET | Freq: Every day | ORAL | Status: DC
  Administered 2021-03-08 – 2021-03-10 (×3): 81 mg via ORAL
  Filled 2021-03-08 (×3): qty 1

## 2021-03-08 MED ORDER — AMLODIPINE BESYLATE 10 MG PO TABS
10.0000 mg | ORAL_TABLET | Freq: Every day | ORAL | Status: DC
  Administered 2021-03-08 – 2021-03-10 (×3): 10 mg via ORAL
  Filled 2021-03-08 (×3): qty 1

## 2021-03-08 MED ORDER — INSULIN ASPART 100 UNIT/ML ~~LOC~~ SOLN
4.0000 [IU] | Freq: Three times a day (TID) | SUBCUTANEOUS | Status: DC
  Administered 2021-03-08 – 2021-03-10 (×5): 4 [IU] via SUBCUTANEOUS
  Filled 2021-03-08 (×5): qty 1

## 2021-03-08 MED ORDER — INSULIN ASPART 100 UNIT/ML ~~LOC~~ SOLN
0.0000 [IU] | SUBCUTANEOUS | Status: DC
  Administered 2021-03-08: 3 [IU] via SUBCUTANEOUS
  Filled 2021-03-08: qty 1

## 2021-03-08 MED ORDER — VANCOMYCIN HCL 1250 MG/250ML IV SOLN
1250.0000 mg | Freq: Two times a day (BID) | INTRAVENOUS | Status: DC
  Filled 2021-03-08: qty 250

## 2021-03-08 MED ORDER — FUROSEMIDE 10 MG/ML IJ SOLN
60.0000 mg | Freq: Once | INTRAMUSCULAR | Status: AC
  Administered 2021-03-08: 60 mg via INTRAVENOUS
  Filled 2021-03-08: qty 6

## 2021-03-08 MED ORDER — INSULIN DETEMIR 100 UNIT/ML ~~LOC~~ SOLN
30.0000 [IU] | Freq: Every day | SUBCUTANEOUS | Status: DC
  Administered 2021-03-08 – 2021-03-10 (×3): 30 [IU] via SUBCUTANEOUS
  Filled 2021-03-08 (×3): qty 0.3

## 2021-03-08 MED ORDER — POLYETHYLENE GLYCOL 3350 17 G PO PACK: 17.0000 g | PACK | Freq: Every day | ORAL | Status: DC | PRN

## 2021-03-08 MED ORDER — ATORVASTATIN CALCIUM 80 MG PO TABS
80.0000 mg | ORAL_TABLET | Freq: Every day | ORAL | Status: DC
  Administered 2021-03-08 – 2021-03-09 (×2): 80 mg via ORAL
  Filled 2021-03-08 (×2): qty 1

## 2021-03-08 MED ORDER — ONDANSETRON HCL 4 MG PO TABS: 4.0000 mg | ORAL_TABLET | Freq: Four times a day (QID) | ORAL | Status: DC | PRN

## 2021-03-08 MED ORDER — MAGNESIUM SULFATE 2 GM/50ML IV SOLN
2.0000 g | Freq: Once | INTRAVENOUS | Status: AC
  Administered 2021-03-08: 2 g via INTRAVENOUS
  Filled 2021-03-08: qty 50

## 2021-03-08 MED ORDER — CLOPIDOGREL BISULFATE 75 MG PO TABS
75.0000 mg | ORAL_TABLET | Freq: Every day | ORAL | Status: DC
  Administered 2021-03-08 – 2021-03-10 (×3): 75 mg via ORAL
  Filled 2021-03-08 (×3): qty 1

## 2021-03-08 MED ORDER — INSULIN ASPART 100 UNIT/ML ~~LOC~~ SOLN: 0.0000 [IU] | Freq: Every day | SUBCUTANEOUS | Status: DC

## 2021-03-08 NOTE — Consult Note (Addendum)
WOC Nurse Consult Note: Reason for Consult: Consult requested for right foot.  Pt states he developed a wound "a few weeks ago." Wound type: Right outer plantar foot with full thickness wound; 2X2X.2cm, 100% black dry outer wound bed, no odor, drainage, or fluctuance.  Generalized edema and erythremia surrounding. Dressing procedure/placement/frequency: Topical treatment orders provided for bedside nurses to perform as follows to assist with enzymatic debridement as follows: Apply Santyl to right foot wound Q day, then cover with moist 2X2 and foam dressing. (Change foam dressing Q 3 days or PRN soiling.) Please re-consult if further assistance is needed.  Thank-you,  Cammie Mcgee MSN, RN, CWOCN, [place], CNS [phone]

## 2021-03-08 NOTE — ED Notes (Signed)
Charge RN called this RN and stated that she will assign inpatient nurse shortly.

## 2021-03-08 NOTE — Progress Notes (Signed)
PROGRESS NOTE    Erik [name]  HFW:[id number] DOB: January 06, 1991 DOA: 03/07/2021 PCP: Pcp, No   Chief complaint.  Shortness of breath. Brief Narrative:  Erik [name] is a 30 y.o. male with medical history significant of asthma, recently diagnosed diabetes, history of COVID-19 with cardiomyopathy about a month ago with left heart cath at the time, morbid obesity, ventricular tachycardia, hyperlipidemia, ARDS as well as AKI who was discharged from rehab on February 26, 2021.  Patient went home and was doing fine but came into the ER today with worsening shortness of breath.  He has a mild elevation procalcitonin level, CT scan ruled out PE, showed diffuse bilateral opacities.  He is started on cefepime for healthcare associated pneumonia.  Assessment & Plan:   Principal Problem:   Acute respiratory failure with hypoxia (HCC) Active Problems:   Obesity, Class III, BMI 40-49.9 (morbid obesity) (HCC)   Dyslipidemia   Essential hypertension   Diabetes mellitus, new onset (HCC)   Sepsis (HCC)   Acute respiratory failure (HCC)   Anemia   Right foot ulcer (HCC)  #1.  Acute hypoxemic respiratory failure.  Secondary to healthcare associated pneumonia. Sepsis secondary to healthcare associated pneumonia. Healthcare associated pneumonia. Patient does not seem to have any volume overload.  Condition is more consistent with bacterial pneumonia.  MRSA culture came back negative, discontinue vancomycin.  Continue cefepime. Follow closely in the progressive unit keep oxygen saturation above 92%. I reviewed patient echocardiogram performed on 01/2021, ejection fraction was normal.  No evidence for diastolic dysfunction. Currently, patient has minimal elevation in BNP, no evidence of acute congestive heart failure.  #2.  Uncontrolled type 2 diabetes with hyperglycemia.  Right foot diabetic ulcer. 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 Restart scheduled Lantus, sliding scale insulin.  We  will make adjustment of insulin on a daily basis. Wound care will follow on the diabetic foot ulcer. Reviewed the previous lab results, patient has chronic kidney disease stage IIIa, does not meet acute kidney injury criteria.  #3.  Anemia. Check iron B12 level.  4.  Morbid obesity.  #5.  Hypomagnesemia Repleted, recheck level tomorrow.     DVT prophylaxis: Lovenox Code Status: Full Family Communication:  Disposition Plan:  .   Status is: Inpatient  Remains inpatient appropriate because:Inpatient level of care appropriate due to severity of illness   Dispo: The patient is from: Home              Anticipated d/c is to: Home              Patient currently is not medically stable to d/c.   Difficult to place patient No        I/O last 3 completed shifts: In: 987.2 [P.O.:360; IV Piggyback:627.2] Out: 450 [Urine:450] Total I/O In: 240 [P.O.:240] Out: 725 [Urine:725]     Consultants:   None  Procedures: None  Antimicrobials: Cefepime.  Subjective: Patient still has significant hypoxia, still has short of breath with exertion. Cough, with some mucus. No fever chills. No abdominal pain or nausea vomiting. No dysuria hematuria.   Objective: Vitals:   03/08/21 0156 03/08/21 0356 03/08/21 0736 03/08/21 1141  BP: 130/79 (!) 142/87 (!) 142/85 138/83  Pulse: 100 95 98 96  Resp: 17 17 19 19   Temp: 98.8 F (37.1 C) 98.6 F (37 C) 97.7 F (36.5 C) 97.9 F (36.6 C)  TempSrc: Oral Oral    SpO2: 94% 94% 97% 98%  Weight:  Height:        Intake/Output Summary (Last 24 hours) at 03/08/2021 1315 Last data filed at 03/08/2021 1029 Gross per 24 hour  Intake 1227.23 ml  Output 1175 ml  Net 52.23 ml   Filed Weights   03/07/21 2014  Weight: (!) 176 kg    Examination:  General exam: Appears calm and comfortable, morbid obese. Respiratory system: Crackles in the base.Marland Kitchen Respiratory effort normal. Cardiovascular system: S1 & S2 heard, RRR. No JVD,  murmurs, rubs, gallops or clicks. No pedal edema. Gastrointestinal system: Abdomen is nondistended, soft and nontender. No organomegaly or masses felt. Normal bowel sounds heard. Central nervous system: Alert and oriented. No focal neurological deficits. Extremities: Symmetric 5 x 5 power. Skin: Right foot diabetic foot ulcer about 1 cm. Psychiatry: Judgement and insight appear normal. Mood & affect appropriate.     Data Reviewed: I have personally reviewed following labs and imaging studies  CBC: Recent Labs  Lab 03/07/21 2045 03/08/21 0239  WBC 12.6* 12.3*  NEUTROABS 9.1*  --   HGB 7.6* 8.4*  HCT 25.2* 27.3*  MCV 84.6 84.3  PLT 439* 428*   Basic Metabolic Panel: Recent Labs  Lab 03/07/21 2045 03/07/21 2225 03/08/21 0239  NA 142  --  137  K 3.8  --  3.7  CL 112*  --  107  CO2 21*  --  19*  GLUCOSE 98  --  229*  BUN 12  --  14  CREATININE 1.45*  --  1.67*  CALCIUM 8.6*  --  8.7*  MG  --  1.6*  --    GFR: Estimated Creatinine Clearance: 105.4 mL/min (A) (by C-G formula based on SCr of 1.67 mg/dL (H)). Liver Function Tests: Recent Labs  Lab 03/07/21 2045 03/08/21 0239  AST 17 10*  ALT 15 11  ALKPHOS 58 66  BILITOT 0.9 0.7  PROT 6.9 7.5  ALBUMIN 3.4* 3.3*   No results for input(s): LIPASE, AMYLASE in the last 168 hours. No results for input(s): AMMONIA in the last 168 hours. Coagulation Profile: Recent Labs  Lab 03/08/21 0239  INR 1.2   Cardiac Enzymes: No results for input(s): CKTOTAL, CKMB, CKMBINDEX, TROPONINI in the last 168 hours. BNP (last 3 results) No results for input(s): PROBNP in the last 8760 hours. HbA1C: No results for input(s): HGBA1C in the last 72 hours. CBG: Recent Labs  Lab 03/08/21 0053 03/08/21 0357 03/08/21 0817 03/08/21 1140  GLUCAP 135* 270* 273* 227*   Lipid Profile: No results for input(s): CHOL, HDL, LDLCALC, TRIG, CHOLHDL, LDLDIRECT in the last 72 hours. Thyroid Function Tests: No results for input(s): TSH,  T4TOTAL, FREET4, T3FREE, THYROIDAB in the last 72 hours. Anemia Panel: No results for input(s): VITAMINB12, FOLATE, FERRITIN, TIBC, IRON, RETICCTPCT in the last 72 hours. Sepsis Labs: Recent Labs  Lab 03/07/21 2225 03/07/21 2329 03/08/21 0239  PROCALCITON 0.31  --  0.32  LATICACIDVEN 1.3 0.8  --     Recent Results (from the past 240 hour(s))  Blood culture (routine x 2)     Status: None (Preliminary result)   Collection Time: 03/07/21 10:25 PM   Specimen: BLOOD  Result Value Ref Range Status   Specimen Description BLOOD  LEFT FORE ARM  Final   Special Requests   Final    BOTTLES DRAWN AEROBIC AND ANAEROBIC Blood Culture adequate volume   Culture   Final    NO GROWTH < 12 HOURS Performed at [facility], [address] [place], [place] [address]  Report Status PENDING  Incomplete  Blood culture (routine x 2)     Status: None (Preliminary result)   Collection Time: 03/07/21 11:32 PM   Specimen: BLOOD  Result Value Ref Range Status   Specimen Description BLOOD RIGHT FA  Final   Special Requests   Final    BOTTLES DRAWN AEROBIC AND ANAEROBIC Blood Culture adequate volume   Culture   Final    NO GROWTH < 12 HOURS Performed at [facility], [address], [place], [place] [address]    Report Status PENDING  Incomplete  MRSA PCR Screening     Status: None   Collection Time: 03/08/21 11:00 AM   Specimen: Nasal Mucosa; Nasopharyngeal  Result Value Ref Range Status   MRSA by PCR NEGATIVE NEGATIVE Final    Comment:        The GeneXpert MRSA Assay (FDA approved for NASAL specimens only), is one component of a comprehensive MRSA colonization surveillance program. It is not intended to diagnose MRSA infection nor to guide or monitor treatment for MRSA infections. Performed at [facility], [address] [place], [place] [address]          Radiology Studies: DG Chest 2 View  Result Date: 03/07/2021 CLINICAL DATA:  Shortness of  breath for 2-3 days EXAM: CHEST - 2 VIEW COMPARISON:  01/19/2021 FINDINGS: Previously seen tubes and lines have been removed in the interval. Cardiac shadow is stable. Bilateral perihilar opacities are noted right considerably greater than left likely related to pneumonic infiltrate or effusion. IMPRESSION: Bilateral airspace opacities right greater than left likely related to multifocal pneumonia or edema. Electronically Signed   By: Alcide Clever M.D.   On: 03/07/2021 21:08   CT Angio Chest PE W and/or Wo Contrast  Result Date: 03/07/2021 CLINICAL DATA:  No definite central pulmonary embolus. Markedly limited evaluation due to timing of contrast. EXAM: CT ANGIOGRAPHY CHEST WITH CONTRAST TECHNIQUE: Multidetector CT imaging of the chest was performed using the standard protocol during bolus administration of intravenous contrast. Multiplanar CT image reconstructions and MIPs were obtained to evaluate the vascular anatomy. CONTRAST:  OMNIPAQUE IOHEXOL 350 MG/ML SOLN COMPARISON:  Chest x-ray 03/07/2021 FINDINGS: Cardiovascular: Poor opacification of the pulmonary arteries to the segmental level. No evidence of pulmonary embolism. The main pulmonary artery is normal in caliber. Normal heart size. Trace pericardial effusion. The thoracic aorta is normal in caliber. No atherosclerotic plaque of the thoracic aorta. No coronary artery calcifications. Mediastinum/Nodes: Multiple prominent mediastinal and likely hilar lymph nodes. No enlarged mediastinal, hilar, or axillary lymph nodes. Thyroid gland, trachea, and esophagus demonstrate no significant findings. Lungs/Pleura: Diffuse, right greater than left, upper lobe predominant peribronchovascular airspace opacities. Bilateral trace to small volume pleural effusion. Upper Abdomen: No acute abnormality. Musculoskeletal: No chest wall abnormality. No acute or significant osseous findings. Review of the MIP images confirms the above findings. IMPRESSION: 1. No  definite central pulmonary embolus with markedly limited evaluation distally due to timing of contrast and respiratory motion artifact. 2. Diffuse peribronchovascular airspace opacities. Finding likely represents pulmonary edema versus infection/inflammation. Differential diagnosis also includes less likely alveolar hemorrhage given upper lobe predominance. 3. Bilateral trace to small volume pleural effusions. Electronically Signed   By: Tish Frederickson M.D.   On: 03/07/2021 22:30        Scheduled Meds: . amLODipine  10 mg Oral Daily  . aspirin  81 mg Oral Daily  . atorvastatin  80 mg Oral Daily  . clopidogrel  75 mg  Oral Daily  . famotidine  20 mg Oral Daily  . heparin  5,000 Units Subcutaneous Q8H  . hydrALAZINE  50 mg Oral Q8H  . insulin aspart  0-20 Units Subcutaneous TID WC  . insulin aspart  0-5 Units Subcutaneous QHS  . insulin aspart  4 Units Subcutaneous TID WC  . insulin detemir  30 Units Subcutaneous Daily  . metoprolol tartrate  100 mg Oral BID   Continuous Infusions: . ceFEPime (MAXIPIME) IV 2 g (03/08/21 0619)     LOS: 1 day    Time spent: 34 minutes    Marrion Coyekui Edmonia Gonser, MD [facility]   To contact the attending provider between 7A-7P or the covering provider during after hours 7P-7A, please log into the web site [email or web address] and access using universal [place] password for that web site. If you do not have the password, please call the hospital operator.  03/08/2021, 1:15 PM

## 2021-03-08 NOTE — Progress Notes (Signed)
Inpatient Diabetes Program Recommendations  AACE/ADA: New Consensus Statement on Inpatient Glycemic Control (2015)  Target Ranges:  Prepandial:   less than 140 mg/dL      Peak postprandial:   less than 180 mg/dL (1-2 hours)      Critically ill patients:  140 - 180 mg/dL   Lab Results  Component Value Date   GLUCAP 227 (H) 03/08/2021   HGBA1C 11.8 (H) 01/27/2021    Review of Glycemic Control  Diabetes history: DM2 Outpatient Diabetes medications: Levemir 37 units qd + Novolog 8 units tid meal coverage Current orders for Inpatient glycemic control: Novolog correction 0-20 units tid + 0-5 units hs  Inpatient Diabetes Program Recommendations:   Noted A1c has decreased from 13.6 on 01/12/21 to 11.8 01/27/21 after starting on insulin. -Add Levemir 30 units daily -Add Novolog 4 units tid meal coverage if eats 50% Secure chat sent to Dr. Chipper Herb.  Thank you, Erik [name]. Hanks, RN, MSN, CDE  Diabetes Coordinator Inpatient Glycemic Control Team Team Pager [phone] (8am-5pm) 03/08/2021 12:20 PM

## 2021-03-09 ENCOUNTER — Ambulatory Visit: Payer: Self-pay

## 2021-03-09 DIAGNOSIS — J189 Pneumonia, unspecified organism: Secondary | ICD-10-CM

## 2021-03-09 DIAGNOSIS — D513 Other dietary vitamin B12 deficiency anemia: Secondary | ICD-10-CM

## 2021-03-09 DIAGNOSIS — D500 Iron deficiency anemia secondary to blood loss (chronic): Secondary | ICD-10-CM

## 2021-03-09 LAB — GLUCOSE, CAPILLARY
Glucose-Capillary: 142 mg/dL — ABNORMAL HIGH (ref 70–99)
Glucose-Capillary: 152 mg/dL — ABNORMAL HIGH (ref 70–99)
Glucose-Capillary: 157 mg/dL — ABNORMAL HIGH (ref 70–99)
Glucose-Capillary: 173 mg/dL — ABNORMAL HIGH (ref 70–99)
Glucose-Capillary: 177 mg/dL — ABNORMAL HIGH (ref 70–99)
Glucose-Capillary: 237 mg/dL — ABNORMAL HIGH (ref 70–99)

## 2021-03-09 LAB — CBC WITH DIFFERENTIAL/PLATELET
Abs Immature Granulocytes: 0.17 10*3/uL — ABNORMAL HIGH (ref 0.00–0.07)
Basophils Absolute: 0.1 10*3/uL (ref 0.0–0.1)
Basophils Relative: 1 %
Eosinophils Absolute: 0.1 10*3/uL (ref 0.0–0.5)
Eosinophils Relative: 1 %
HCT: 23.6 % — ABNORMAL LOW (ref 39.0–52.0)
Hemoglobin: 7 g/dL — ABNORMAL LOW (ref 13.0–17.0)
Immature Granulocytes: 2 %
Lymphocytes Relative: 23 %
Lymphs Abs: 2.6 10*3/uL (ref 0.7–4.0)
MCH: 25.2 pg — ABNORMAL LOW (ref 26.0–34.0)
MCHC: 29.7 g/dL — ABNORMAL LOW (ref 30.0–36.0)
MCV: 84.9 fL (ref 80.0–100.0)
Monocytes Absolute: 1.4 10*3/uL — ABNORMAL HIGH (ref 0.1–1.0)
Monocytes Relative: 12 %
Neutro Abs: 7.3 10*3/uL (ref 1.7–7.7)
Neutrophils Relative %: 61 %
Platelets: 410 10*3/uL — ABNORMAL HIGH (ref 150–400)
RBC: 2.78 MIL/uL — ABNORMAL LOW (ref 4.22–5.81)
RDW: 14.7 % (ref 11.5–15.5)
WBC: 11.6 10*3/uL — ABNORMAL HIGH (ref 4.0–10.5)
nRBC: 0 % (ref 0.0–0.2)

## 2021-03-09 LAB — BASIC METABOLIC PANEL
Anion gap: 8 (ref 5–15)
BUN: 21 mg/dL — ABNORMAL HIGH (ref 6–20)
CO2: 21 mmol/L — ABNORMAL LOW (ref 22–32)
Calcium: 8.7 mg/dL — ABNORMAL LOW (ref 8.9–10.3)
Chloride: 111 mmol/L (ref 98–111)
Creatinine, Ser: 1.76 mg/dL — ABNORMAL HIGH (ref 0.61–1.24)
GFR, Estimated: 53 mL/min — ABNORMAL LOW (ref >60)
Glucose, Bld: 186 mg/dL — ABNORMAL HIGH (ref 70–99)
Potassium: 3.4 mmol/L — ABNORMAL LOW (ref 3.5–5.1)
Sodium: 140 mmol/L (ref 135–145)

## 2021-03-09 LAB — PREPARE RBC (CROSSMATCH)

## 2021-03-09 LAB — ABO/RH: ABO/RH(D): O POS

## 2021-03-09 LAB — MAGNESIUM: Magnesium: 1.7 mg/dL (ref 1.7–2.4)

## 2021-03-09 MED ORDER — SODIUM CHLORIDE 0.9% IV SOLUTION: Freq: Once | INTRAVENOUS | Status: AC

## 2021-03-09 MED ORDER — POTASSIUM CHLORIDE 20 MEQ PO PACK
40.0000 meq | PACK | Freq: Once | ORAL | Status: AC
  Administered 2021-03-09: 40 meq via ORAL
  Filled 2021-03-09: qty 2

## 2021-03-09 MED ORDER — MAGNESIUM SULFATE 2 GM/50ML IV SOLN
2.0000 g | Freq: Once | INTRAVENOUS | Status: AC
  Administered 2021-03-09: 2 g via INTRAVENOUS
  Filled 2021-03-09: qty 50

## 2021-03-09 MED ORDER — FUROSEMIDE 10 MG/ML IJ SOLN
40.0000 mg | Freq: Once | INTRAMUSCULAR | Status: AC
  Administered 2021-03-09: 40 mg via INTRAVENOUS
  Filled 2021-03-09: qty 4

## 2021-03-09 MED ORDER — SODIUM CHLORIDE 0.9 % IV SOLN
300.0000 mg | Freq: Once | INTRAVENOUS | Status: AC
  Administered 2021-03-09: 300 mg via INTRAVENOUS
  Filled 2021-03-09: qty 15

## 2021-03-09 MED ORDER — CYANOCOBALAMIN 1000 MCG/ML IJ SOLN
1000.0000 ug | Freq: Every day | INTRAMUSCULAR | Status: DC
  Administered 2021-03-09 – 2021-03-10 (×2): 1000 ug via INTRAMUSCULAR
  Filled 2021-03-09 (×2): qty 1

## 2021-03-09 NOTE — Progress Notes (Addendum)
PROGRESS NOTE    Erik [name]  GDJ:[id number] DOB: 1991-01-12 DOA: 03/07/2021 PCP: Pcp, No   Chief Complaint.  Shortness of breath. Brief Narrative:  Erik [name] a 30 y.o.malewith medical history significant ofasthma, recently diagnosed diabetes, history of COVID-19 with cardiomyopathy about a month ago with left heart cath at the time, morbid obesity, ventricular tachycardia, hyperlipidemia, ARDS as well as AKI who was discharged from rehab on February 26, 2021. Patient went home and was doing fine but came into the ER today with worsening shortness of breath.  He has a mild elevation procalcitonin level, CT scan ruled out PE, showed diffuse bilateral opacities.  He is started on cefepime for healthcare associated pneumonia.   Assessment & Plan:   Principal Problem:   Acute respiratory failure with hypoxia (HCC) Active Problems:   Obesity, Class III, BMI 40-49.9 (morbid obesity) ([place])   Dyslipidemia   Essential hypertension   Diabetes mellitus, new onset ([place])   Sepsis ([place])   Acute respiratory failure (HCC)   Anemia   Right foot ulcer ([place])  #1.  Acute hypoxemic respiratory failure.  Secondary to healthcare associated pneumonia. Sepsis secondary to healthcare associated pneumonia. Healthcare associated pneumonia. Patient met sepsis criteria time admission with significant tachycardia at 160s, tachypnea and leukocytosis.  His lactic acid level was normal.  Chest x-ray showed bilateral airspace opacities.  Mild elevation of procalcitonin level. Patient blood culture has been negative, MRSA culture negative.  Continue cefepime coverage. Oxygenation is better, currently on 2 L oxygen with good saturation.  Continue to wean oxygen. Patient overall condition improving.  Anticipating discharge in 1 to 2 days.  2.  Symptomatic anemia. Iron deficient anemia. B12 deficient anemia. Reactive thrombocytosis. Patient has no evidence of GI bleed or other bleeding.  Discussed with the  patient about blood transfusion, hemoglobin 7.0.  Patient is very symptomatic with significant short of breath from anemia.  Discussed about the risk including infection, allergic reaction, rare reaction such as transfusion related lung injury.  Patient is agreeable for transfusion.  We will give 1 unit PRBC.  We will also give her Lasix afterwards. Patient also gave IV iron.  B12 injection daily until discharge.  #3.  Uncontrolled type 2 diabetes with hyperglycemia Right foot diabetic ulcer. 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 Wound care had evaluated patient foot ulcer, no infection. Continue current diabetic regimen.  #4.  Morbid obesity.  5.  Hypomagnesemia. Hypokalemia. Repleted.   DVT prophylaxis: Lovenox Code Status: Full Family Communication: Updated at bedside. Disposition Plan:  .   Status is: Inpatient  Remains inpatient appropriate because:Inpatient level of care appropriate due to severity of illness   Dispo: The patient is from: Home              Anticipated d/c is to: Home              Patient currently is not medically stable to d/c.   Difficult to place patient No        I/O last 3 completed shifts: In: 1347.2 [P.O.:720; IV Piggyback:627.2] Out: 2200 [Urine:2200] Total I/O In: 240 [P.O.:240] Out: 0      Consultants:   None  Procedures: None  Antimicrobials: Cefepime  Subjective: Patient complaining short of breath at rest, worse with exertion. No cough or chest pain. No abdominal pain or nausea vomiting.  No diarrhea No fever or chills. No dysuria hematuria.  Objective: Vitals:   03/08/21 2123 03/09/21 0419 03/09/21 0857 03/09/21 1128  BP: Marland Kitchen)  142/88 128/85 131/78 131/84  Pulse: 98 88 97 88  Resp: '18 18 18 18  ' Temp:  98 F (36.7 C) 97.8 F (36.6 C) 97.6 F (36.4 C)  TempSrc:      SpO2: 97% 96% 98% 98%  Weight:  (!) 182.7 kg    Height:        Intake/Output Summary (Last 24 hours) at 03/09/2021 1324 Last  data filed at 03/09/2021 1000 Gross per 24 hour  Intake 360 ml  Output 0 ml  Net 360 ml   Filed Weights   03/07/21 2014 03/09/21 0419  Weight: (!) 176 kg (!) 182.7 kg    Examination:  General exam: Appears calm and comfortable, morbid obese. Respiratory system: Decreased breathing sounds without crackles or wheezes. Respiratory effort normal. Cardiovascular system: S1 & S2 heard, RRR. No JVD, murmurs, rubs, gallops or clicks. No pedal edema. Gastrointestinal system: Abdomen is nondistended, soft and nontender. No organomegaly or masses felt. Normal bowel sounds heard. Central nervous system: Alert and oriented. No focal neurological deficits. Extremities: Symmetric 5 x 5 power. Skin: right foot ulcer. Psychiatry: Judgement and insight appear normal. Mood & affect appropriate.     Data Reviewed: I have personally reviewed following labs and imaging studies  CBC: Recent Labs  Lab 03/07/21 2045 03/08/21 0239 03/09/21 0415  WBC 12.6* 12.3* 11.6*  NEUTROABS 9.1*  --  7.3  HGB 7.6* 8.4* 7.0*  HCT 25.2* 27.3* 23.6*  MCV 84.6 84.3 84.9  PLT 439* 428* 166*   Basic Metabolic Panel: Recent Labs  Lab 03/07/21 2045 03/07/21 2225 03/08/21 0239 03/09/21 0415  NA 142  --  137 140  K 3.8  --  3.7 3.4*  CL 112*  --  107 111  CO2 21*  --  19* 21*  GLUCOSE 98  --  229* 186*  BUN 12  --  14 21*  CREATININE 1.45*  --  1.67* 1.76*  CALCIUM 8.6*  --  8.7* 8.7*  MG  --  1.6*  --  1.7   GFR: Estimated Creatinine Clearance: 101.5 mL/min (A) (by C-G formula based on SCr of 1.76 mg/dL (H)). Liver Function Tests: Recent Labs  Lab 03/07/21 2045 03/08/21 0239  AST 17 10*  ALT 15 11  ALKPHOS 58 66  BILITOT 0.9 0.7  PROT 6.9 7.5  ALBUMIN 3.4* 3.3*   No results for input(s): LIPASE, AMYLASE in the last 168 hours. No results for input(s): AMMONIA in the last 168 hours. Coagulation Profile: Recent Labs  Lab 03/08/21 0239  INR 1.2   Cardiac Enzymes: No results for input(s):  CKTOTAL, CKMB, CKMBINDEX, TROPONINI in the last 168 hours. BNP (last 3 results) No results for input(s): PROBNP in the last 8760 hours. HbA1C: No results for input(s): HGBA1C in the last 72 hours. CBG: Recent Labs  Lab 03/08/21 2122 03/09/21 0006 03/09/21 0417 03/09/21 0857 03/09/21 1127  GLUCAP 199* 173* 177* 237* 157*   Lipid Profile: No results for input(s): CHOL, HDL, LDLCALC, TRIG, CHOLHDL, LDLDIRECT in the last 72 hours. Thyroid Function Tests: No results for input(s): TSH, T4TOTAL, FREET4, T3FREE, THYROIDAB in the last 72 hours. Anemia Panel: Recent Labs    03/08/21 0239  VITAMINB12 136*  TIBC 270  IRON 16*   Sepsis Labs: Recent Labs  Lab 03/07/21 2225 03/07/21 2329 03/08/21 0239  PROCALCITON 0.31  --  0.32  LATICACIDVEN 1.3 0.8  --     Recent Results (from the past 240 hour(s))  Blood culture (routine x 2)  Status: None (Preliminary result)   Collection Time: 03/07/21 10:25 PM   Specimen: BLOOD  Result Value Ref Range Status   Specimen Description BLOOD  LEFT FORE ARM  Final   Special Requests   Final    BOTTLES DRAWN AEROBIC AND ANAEROBIC Blood Culture adequate volume   Culture   Final    NO GROWTH 2 DAYS Performed at [facility], [address], [place], [place] [address]    Report Status PENDING  Incomplete  Blood culture (routine x 2)     Status: None (Preliminary result)   Collection Time: 03/07/21 11:32 PM   Specimen: BLOOD  Result Value Ref Range Status   Specimen Description BLOOD RIGHT FA  Final   Special Requests   Final    BOTTLES DRAWN AEROBIC AND ANAEROBIC Blood Culture adequate volume   Culture   Final    NO GROWTH 2 DAYS Performed at [facility], [address] [place], [place] [address]    Report Status PENDING  Incomplete  MRSA PCR Screening     Status: None   Collection Time: 03/08/21 11:00 AM   Specimen: Nasal Mucosa; Nasopharyngeal  Result Value Ref Range Status   MRSA by PCR NEGATIVE NEGATIVE  Final    Comment:        The GeneXpert MRSA Assay (FDA approved for NASAL specimens only), is one component of a comprehensive MRSA colonization surveillance program. It is not intended to diagnose MRSA infection nor to guide or monitor treatment for MRSA infections. Performed at [facility], [address] [place], [place] [address]          Radiology Studies: DG Chest 2 View  Result Date: 03/07/2021 CLINICAL DATA:  Shortness of breath for 2-3 days EXAM: CHEST - 2 VIEW COMPARISON:  01/19/2021 FINDINGS: Previously seen tubes and lines have been removed in the interval. Cardiac shadow is stable. Bilateral perihilar opacities are noted right considerably greater than left likely related to pneumonic infiltrate or effusion. IMPRESSION: Bilateral airspace opacities right greater than left likely related to multifocal pneumonia or edema. Electronically Signed   By: Inez Catalina M.D.   On: 03/07/2021 21:08   CT Angio Chest PE W and/or Wo Contrast  Result Date: 03/07/2021 CLINICAL DATA:  No definite central pulmonary embolus. Markedly limited evaluation due to timing of contrast. EXAM: CT ANGIOGRAPHY CHEST WITH CONTRAST TECHNIQUE: Multidetector CT imaging of the chest was performed using the standard protocol during bolus administration of intravenous contrast. Multiplanar CT image reconstructions and MIPs were obtained to evaluate the vascular anatomy. CONTRAST:  13m OMNIPAQUE IOHEXOL 350 MG/ML SOLN COMPARISON:  Chest x-ray 03/07/2021 FINDINGS: Cardiovascular: Poor opacification of the pulmonary arteries to the segmental level. No evidence of pulmonary embolism. The main pulmonary artery is normal in caliber. Normal heart size. Trace pericardial effusion. The thoracic aorta is normal in caliber. No atherosclerotic plaque of the thoracic aorta. No coronary artery calcifications. Mediastinum/Nodes: Multiple prominent mediastinal and likely hilar lymph nodes. No enlarged mediastinal,  hilar, or axillary lymph nodes. Thyroid gland, trachea, and esophagus demonstrate no significant findings. Lungs/Pleura: Diffuse, right greater than left, upper lobe predominant peribronchovascular airspace opacities. Bilateral trace to small volume pleural effusion. Upper Abdomen: No acute abnormality. Musculoskeletal: No chest wall abnormality. No acute or significant osseous findings. Review of the MIP images confirms the above findings. IMPRESSION: 1. No definite central pulmonary embolus with markedly limited evaluation distally due to timing of contrast and respiratory motion artifact. 2. Diffuse peribronchovascular airspace opacities. Finding  likely represents pulmonary edema versus infection/inflammation. Differential diagnosis also includes less likely alveolar hemorrhage given upper lobe predominance. 3. Bilateral trace to small volume pleural effusions. Electronically Signed   By: Iven Finn M.D.   On: 03/07/2021 22:30        Scheduled Meds: . sodium chloride   Intravenous Once  . amLODipine  10 mg Oral Daily  . aspirin  81 mg Oral Daily  . atorvastatin  80 mg Oral Daily  . clopidogrel  75 mg Oral Daily  . collagenase   Topical Daily  . cyanocobalamin  1,000 mcg Intramuscular Daily  . famotidine  20 mg Oral Daily  . furosemide  40 mg Intravenous Once  . heparin  5,000 Units Subcutaneous Q8H  . hydrALAZINE  50 mg Oral Q8H  . insulin aspart  0-20 Units Subcutaneous TID WC  . insulin aspart  0-5 Units Subcutaneous QHS  . insulin aspart  4 Units Subcutaneous TID WC  . insulin detemir  30 Units Subcutaneous Daily  . metoprolol tartrate  100 mg Oral BID   Continuous Infusions: . ceFEPime (MAXIPIME) IV 2 g (03/09/21 0540)  . magnesium sulfate bolus IVPB 2 g (03/09/21 1251)     LOS: 2 days    Time spent: 35 minutes    Sharen Hones, MD [facility]   To contact the attending provider between 7A-7P or the covering provider during after hours 7P-7A, please log into  the web site [email or web address] and access using universal [place] password for that web site. If you do not have the password, please call the hospital operator.  03/09/2021, 1:24 PM

## 2021-03-10 ENCOUNTER — Ambulatory Visit: Payer: Self-pay

## 2021-03-10 ENCOUNTER — Encounter: Payer: Self-pay | Admitting: Registered Nurse

## 2021-03-10 ENCOUNTER — Other Ambulatory Visit: Payer: Self-pay | Admitting: Internal Medicine

## 2021-03-10 LAB — MAGNESIUM: Magnesium: 2 mg/dL (ref 1.7–2.4)

## 2021-03-10 LAB — BASIC METABOLIC PANEL
Anion gap: 6 (ref 5–15)
BUN: 19 mg/dL (ref 6–20)
CO2: 23 mmol/L (ref 22–32)
Calcium: 8.9 mg/dL (ref 8.9–10.3)
Chloride: 111 mmol/L (ref 98–111)
Creatinine, Ser: 1.45 mg/dL — ABNORMAL HIGH (ref 0.61–1.24)
GFR, Estimated: >60 mL/min (ref >60)
Glucose, Bld: 131 mg/dL — ABNORMAL HIGH (ref 70–99)
Potassium: 3.6 mmol/L (ref 3.5–5.1)
Sodium: 140 mmol/L (ref 135–145)

## 2021-03-10 LAB — GLUCOSE, CAPILLARY
Glucose-Capillary: 144 mg/dL — ABNORMAL HIGH (ref 70–99)
Glucose-Capillary: 137 mg/dL — ABNORMAL HIGH (ref 70–99)

## 2021-03-10 LAB — CBC
HCT: 26.6 % — ABNORMAL LOW (ref 39.0–52.0)
Hemoglobin: 8 g/dL — ABNORMAL LOW (ref 13.0–17.0)
MCH: 25.1 pg — ABNORMAL LOW (ref 26.0–34.0)
MCHC: 30.1 g/dL (ref 30.0–36.0)
MCV: 83.4 fL (ref 80.0–100.0)
Platelets: 419 10*3/uL — ABNORMAL HIGH (ref 150–400)
RBC: 3.19 MIL/uL — ABNORMAL LOW (ref 4.22–5.81)
RDW: 15.7 % — ABNORMAL HIGH (ref 11.5–15.5)
WBC: 9.9 10*3/uL (ref 4.0–10.5)
nRBC: 0 % (ref 0.0–0.2)

## 2021-03-10 MED ORDER — AMOXICILLIN-POT CLAVULANATE 875-125 MG PO TABS: 1.0000 | ORAL_TABLET | Freq: Two times a day (BID) | ORAL | 0 refills | Status: DC

## 2021-03-10 MED ORDER — AMOXICILLIN-POT CLAVULANATE 875-125 MG PO TABS: 1.0000 | ORAL_TABLET | Freq: Two times a day (BID) | ORAL | 0 refills | Status: AC

## 2021-03-10 MED ORDER — AMOXICILLIN-POT CLAVULANATE 875-125 MG PO TABS: 1.0000 | ORAL_TABLET | Freq: Two times a day (BID) | ORAL | Status: DC

## 2021-03-10 NOTE — Progress Notes (Signed)
Pt will be discharging home. Offers no concerns at this time.  

## 2021-03-10 NOTE — Discharge Summary (Signed)
CAS TRACZ HUD:[id number] DOB: 09-17-1991 DOA: 03/07/2021  PCP: Pcp, No  Admit date: 03/07/2021 Discharge date: 03/10/2021  Admitted From: home Disposition:  home  Recommendations for Outpatient Follow-up:  1. Follow up with PCP in 1 week 2. Please obtain BMP/CBC in one week 3.   Home Health:TOC    Discharge Condition:Stable CODE STATUS:full  Diet recommendation: Heart Healthy /carb control  Brief/Interim Summary: Per Erik [name] is a 30 y.o. male with medical history significant of asthma, recently diagnosed diabetes, history of COVID-19 with cardiomyopathy about a month ago with left heart cath at the time, morbid obesity, ventricular tachycardia, hyperlipidemia, ARDS as well as AKI who was discharged from rehab on February 26, 2021.  Patient went home and was doing fine but came into the ER today with worsening shortness of breath.  Shortness of breath was both at rest and mild exertion.CT angiogram of the chest showed no PE but findings consistent with possible infiltrates again pneumonia versus CHF.  Patient will be admitted for acute hypoxic respiratory failure and pneumonia.    #1. Acute hypoxemic respiratory failure. Secondary to healthcare associated pneumonia. Sepsis secondary to healthcare associated pneumonia. Healthcare associated pneumonia. Patient met sepsis criteria time admission with significant tachycardia at 160s, tachypnea and leukocytosis.  His lactic acid level was normal.  Chest x-ray showed bilateral airspace opacities.  Mild elevation of procalcitonin level. Patient blood culture has been negative, MRSA culture negative. Was started on iv antibiotic Was weaned off to Room air with good 02 saturation above 92% Discussed with patient about ambulating as outpatient as tolerated Continue finishing antibiotic course po    2.  Symptomatic anemia. Iron deficient anemia. B12 deficient anemia. Reactive thrombocytosis. Patient has no evidence of GI bleed  or other bleeding.  S/p 1 unit prbc transfusion Received b12 injections daily as inpatient Will need to get setup with pcp for further monitoring and management   #3.  Uncontrolled type 2 diabetes with hyperglycemia Right foot diabetic ulcer. 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 Wound care had evaluated patient foot ulcer, no infection. Continue home meds F/u with pcpc  #4.  Morbid obesity.  5.  Hypomagnesemia. Hypokalemia. Replaced and stable.  Discharge Diagnoses:  Principal Problem:   Acute respiratory failure with hypoxia (HCC) Active Problems:   Obesity, Class III, BMI 40-49.9 (morbid obesity) (HCC)   Dyslipidemia   Essential hypertension   Diabetes mellitus, new onset (HCC)   Sepsis (HCC)   Acute respiratory failure (HCC)   B12 deficiency anemia   Right foot ulcer (HCC)   Iron deficiency anemia due to chronic blood loss   Healthcare-associated pneumonia    Discharge Instructions  Discharge Instructions    Diet - low sodium heart healthy   Complete by: As directed    Diet Carb Modified   Complete by: As directed    Discharge instructions   Complete by: As directed    Need to f/u with pcp in one week   Discharge wound care:   Complete by: As directed    As above   Increase activity slowly   Complete by: As directed      Allergies as of 03/10/2021   No Known Allergies     Medication List    TAKE these medications   albuterol 108 (90 Base) MCG/ACT inhaler Commonly known as: VENTOLIN HFA Inhale 1-2 puffs into the lungs every 6 (six) hours as needed for wheezing or shortness of breath.   amLODipine 10 MG tablet Commonly  known as: NORVASC Take 1 tablet (10 mg total) by mouth daily.   amoxicillin-clavulanate 875-125 MG tablet Commonly known as: AUGMENTIN Take 1 tablet by mouth every 12 (twelve) hours for 5 days.   aspirin 81 MG chewable tablet Chew 1 tablet (81 mg total) by mouth daily.   atorvastatin 80 MG  tablet Commonly known as: LIPITOR Take 1 tablet (80 mg total) by mouth daily.   blood glucose meter kit and supplies Kit Dispense based on patient and insurance preference. Use up to four times daily as directed.   clopidogrel 75 MG tablet Commonly known as: PLAVIX Take 1 tablet (75 mg total) by mouth daily.   collagenase ointment Commonly known as: SANTYL Apply topically daily. Apply to yellow slough on breakdown area, cover with damp to dry dressing and change daily.   dextromethorphan-guaiFENesin 30-600 MG 12hr tablet Commonly known as: MUCINEX DM Take 1 tablet by mouth 2 (two) times daily as needed for cough.   docusate sodium 100 MG capsule Commonly known as: COLACE Take 1 capsule (100 mg total) by mouth 2 (two) times daily as needed for mild constipation.   famotidine 20 MG tablet Commonly known as: PEPCID Take 1 tablet (20 mg total) by mouth daily.   hydrALAZINE 50 MG tablet Commonly known as: APRESOLINE Take 1 tablet (50 mg total) by mouth every 8 (eight) hours.   insulin aspart 100 UNIT/ML FlexPen Commonly known as: NOVOLOG Inject 8 Units into the skin 3 (three) times daily with meals.   insulin detemir 100 UNIT/ML FlexPen Commonly known as: LEVEMIR Inject 37 Units into the skin daily.   Insulin Pen Needle 31G X 6 MM Misc 1 application by Does not apply route with breakfast, with lunch, and with evening meal.   metoprolol tartrate 100 MG tablet Commonly known as: LOPRESSOR Take 1 tablet (100 mg total) by mouth 2 (two) times daily.   multivitamin with minerals Tabs tablet Take 1 tablet by mouth daily.   Muscle Rub 10-15 % Crea Apply 1 application topically 2 (two) times daily. What changed:   when to take this  reasons to take this   nystatin-triamcinolone cream Commonly known as: MYCOLOG II Apply topically 2 (two) times daily. Thin layer to skin folds   polyethylene glycol 17 g packet Commonly known as: MIRALAX / GLYCOLAX Take 17 g by mouth  daily as needed for moderate constipation.            Discharge Care Instructions  (From admission, onward)         Start     Ordered   03/10/21 0000  Discharge wound care:       Comments: As above   03/10/21 1053          No Known Allergies  Consultations:     Procedures/Studies: DG Chest 2 View  Result Date: 03/07/2021 CLINICAL DATA:  Shortness of breath for 2-3 days EXAM: CHEST - 2 VIEW COMPARISON:  01/19/2021 FINDINGS: Previously seen tubes and lines have been removed in the interval. Cardiac shadow is stable. Bilateral perihilar opacities are noted right considerably greater than left likely related to pneumonic infiltrate or effusion. IMPRESSION: Bilateral airspace opacities right greater than left likely related to multifocal pneumonia or edema. Electronically Signed   By: Inez Catalina M.D.   On: 03/07/2021 21:08   CT Angio Chest PE W and/or Wo Contrast  Result Date: 03/07/2021 CLINICAL DATA:  No definite central pulmonary embolus. Markedly limited evaluation due to timing of contrast. EXAM: CT  ANGIOGRAPHY CHEST WITH CONTRAST TECHNIQUE: Multidetector CT imaging of the chest was performed using the standard protocol during bolus administration of intravenous contrast. Multiplanar CT image reconstructions and MIPs were obtained to evaluate the vascular anatomy. CONTRAST:  150m OMNIPAQUE IOHEXOL 350 MG/ML SOLN COMPARISON:  Chest x-ray 03/07/2021 FINDINGS: Cardiovascular: Poor opacification of the pulmonary arteries to the segmental level. No evidence of pulmonary embolism. The main pulmonary artery is normal in caliber. Normal heart size. Trace pericardial effusion. The thoracic aorta is normal in caliber. No atherosclerotic plaque of the thoracic aorta. No coronary artery calcifications. Mediastinum/Nodes: Multiple prominent mediastinal and likely hilar lymph nodes. No enlarged mediastinal, hilar, or axillary lymph nodes. Thyroid gland, trachea, and esophagus demonstrate no  significant findings. Lungs/Pleura: Diffuse, right greater than left, upper lobe predominant peribronchovascular airspace opacities. Bilateral trace to small volume pleural effusion. Upper Abdomen: No acute abnormality. Musculoskeletal: No chest wall abnormality. No acute or significant osseous findings. Review of the MIP images confirms the above findings. IMPRESSION: 1. No definite central pulmonary embolus with markedly limited evaluation distally due to timing of contrast and respiratory motion artifact. 2. Diffuse peribronchovascular airspace opacities. Finding likely represents pulmonary edema versus infection/inflammation. Differential diagnosis also includes less likely alveolar hemorrhage given upper lobe predominance. 3. Bilateral trace to small volume pleural effusions. Electronically Signed   By: MIven FinnM.D.   On: 03/07/2021 22:30       Subjective: Feels well. Has no complaints  Discharge Exam: Vitals:   03/10/21 1009 03/10/21 1223  BP:  (!) 154/97  Pulse: 94 80  Resp:  17  Temp:  98.3 F (36.8 C)  SpO2: 95% 96%   Vitals:   03/10/21 0600 03/10/21 0838 03/10/21 1009 03/10/21 1223  BP:  126/77  (!) 154/97  Pulse:  100 94 80  Resp:  19  17  Temp:  98.1 F (36.7 C)  98.3 F (36.8 C)  TempSrc:  Oral  Oral  SpO2:  94% 95% 96%  Weight: (!) 181.1 kg     Height:        General: Pt is alert, awake, not in acute distress Cardiovascular: RRR, S1/S2 +, no rubs, no gallops Respiratory: CTA bilaterally, no wheezing, no rhonchi Abdominal: Soft, NT, ND, bowel sounds + Extremities: no edema    The results of significant diagnostics from this hospitalization (including imaging, microbiology, ancillary and laboratory) are listed below for reference.     Microbiology: Recent Results (from the past 240 hour(s))  Blood culture (routine x 2)     Status: None (Preliminary result)   Collection Time: 03/07/21 10:25 PM   Specimen: BLOOD  Result Value Ref Range Status    Specimen Description BLOOD  LEFT FORE ARM  Final   Special Requests   Final    BOTTLES DRAWN AEROBIC AND ANAEROBIC Blood Culture adequate volume   Culture   Final    NO GROWTH 3 DAYS [facility] [address] [place] [address]   Report Status PENDING  Incomplete  Blood culture (routine x 2)     Status: None (Preliminary result)   Collection Time: 03/07/21 11:32 PM   Specimen: BLOOD  Result Value Ref Range Status   Specimen Description BLOOD RIGHT FA  Final   Special Requests   Final    BOTTLES DRAWN AEROBIC AND ANAEROBIC Blood Culture adequate volume   Culture   Final    NO GROWTH 3 DAYS [facility] [address]  [place] [address]    Report Status PENDING  Incomplete  MRSA PCR Screening     Status: None   Collection Time: 03/08/21 11:00 AM   Specimen: Nasal Mucosa; Nasopharyngeal  Result Value Ref Range Status   MRSA by PCR NEGATIVE NEGATIVE Final    Comment:        The GeneXpert MRSA Assay (FDA approved for NASAL specimens only), is one component of a comprehensive MRSA colonization surveillance program. It is not intended to diagnose MRSA infection nor to guide or monitor treatment for MRSA infections. Performed at [facility], [address] [place], [place] [address]      Labs: BNP (last 3 results) Recent Labs    01/17/21 1536 01/28/21 1050 03/07/21 2050  BNP 60.4 38.7 932.6*   Basic Metabolic Panel: Recent Labs  Lab 03/07/21 2045 03/07/21 2225 03/08/21 0239 03/09/21 0415 03/10/21 0548  NA 142  --  137 140 140  K 3.8  --  3.7 3.4* 3.6  CL 112*  --  107 111 111  CO2 21*  --  19* 21* 23  GLUCOSE 98  --  229* 186* 131*  BUN 12  --  14 21* 19  CREATININE 1.45*  --  1.67* 1.76* 1.45*  CALCIUM 8.6*  --  8.7* 8.7* 8.9  MG  --  1.6*  --  1.7 2.0   Liver Function Tests: Recent Labs  Lab 03/07/21 2045 03/08/21 0239  AST 17 10*  ALT 15 11  ALKPHOS 58 66  BILITOT 0.9  0.7  PROT 6.9 7.5  ALBUMIN 3.4* 3.3*   No results for input(s): LIPASE, AMYLASE in the last 168 hours. No results for input(s): AMMONIA in the last 168 hours. CBC: Recent Labs  Lab 03/07/21 2045 03/08/21 0239 03/09/21 0415 03/10/21 0548  WBC 12.6* 12.3* 11.6* 9.9  NEUTROABS 9.1*  --  7.3  --   HGB 7.6* 8.4* 7.0* 8.0*  HCT 25.2* 27.3* 23.6* 26.6*  MCV 84.6 84.3 84.9 83.4  PLT 439* 428* 410* 419*   Cardiac Enzymes: No results for input(s): CKTOTAL, CKMB, CKMBINDEX, TROPONINI in the last 168 hours. BNP: Invalid input(s): POCBNP CBG: Recent Labs  Lab 03/09/21 1127 03/09/21 1629 03/09/21 2046 03/10/21 0840 03/10/21 1224  GLUCAP 157* 152* 142* 144* 137*   D-Dimer Recent Labs    03/07/21 2045  DDIMER 0.87*   Hgb A1c No results for input(s): HGBA1C in the last 72 hours. Lipid Profile No results for input(s): CHOL, HDL, LDLCALC, TRIG, CHOLHDL, LDLDIRECT in the last 72 hours. Thyroid function studies No results for input(s): TSH, T4TOTAL, T3FREE, THYROIDAB in the last 72 hours.  Invalid input(s): FREET3 Anemia work up Recent Labs    03/08/21 0239  VITAMINB12 136*  TIBC 270  IRON 16*   Urinalysis    Component Value Date/Time   COLORURINE AMBER (A) 01/13/2021 1046   APPEARANCEUR TURBID (A) 01/13/2021 1046   LABSPEC 1.027 01/13/2021 1046   PHURINE 5.0 01/13/2021 1046   GLUCOSEU 50 (A) 01/13/2021 1046   HGBUR SMALL (A) 01/13/2021 1046   BILIRUBINUR NEGATIVE 01/13/2021 1046   KETONESUR 5 (A) 01/13/2021 1046   PROTEINUR 30 (A) 01/13/2021 1046   NITRITE NEGATIVE 01/13/2021 1046   LEUKOCYTESUR TRACE (A) 01/13/2021 1046   Sepsis Labs Invalid input(s): PROCALCITONIN,  WBC,  LACTICIDVEN Microbiology Recent Results (from the past 240 hour(s))  Blood culture (routine x 2)     Status: None (Preliminary result)   Collection Time: 03/07/21 10:25 PM   Specimen:  BLOOD  Result Value Ref Range Status   Specimen Description BLOOD  LEFT FORE ARM  Final   Special  Requests   Final    BOTTLES DRAWN AEROBIC AND ANAEROBIC Blood Culture adequate volume   Culture   Final    NO GROWTH 3 DAYS Performed at [facility], [address] [place], [place] [address]    Report Status PENDING  Incomplete  Blood culture (routine x 2)     Status: None (Preliminary result)   Collection Time: 03/07/21 11:32 PM   Specimen: BLOOD  Result Value Ref Range Status   Specimen Description BLOOD RIGHT FA  Final   Special Requests   Final    BOTTLES DRAWN AEROBIC AND ANAEROBIC Blood Culture adequate volume   Culture   Final    NO GROWTH 3 DAYS Performed at [facility], [address], [place], [place] [address]    Report Status PENDING  Incomplete  MRSA PCR Screening     Status: None   Collection Time: 03/08/21 11:00 AM   Specimen: Nasal Mucosa; Nasopharyngeal  Result Value Ref Range Status   MRSA by PCR NEGATIVE NEGATIVE Final    Comment:        The GeneXpert MRSA Assay (FDA approved for NASAL specimens only), is one component of a comprehensive MRSA colonization surveillance program. It is not intended to diagnose MRSA infection nor to guide or monitor treatment for MRSA infections. Performed at [facility], [address] [place], [place] [address]      Time coordinating discharge: Over 30 minutes  SIGNED:   Nolberto Hanlon, MD  [facility] 03/10/2021, 5:00 PM Pager   If 7PM-7AM, please contact night-coverage [email or web address] Password [email or web address]

## 2021-03-10 NOTE — TOC Transition Note (Signed)
Transition of Care [facility]) - CM/SW Discharge Note   Patient Details  Name: Erik [name] MRN: [id number] Date of Birth: 1991-02-16  Transition of [facility]) CM/SW Contact:  Hetty Ely, RN Phone Number: 03/10/2021, 1:53 PM   Clinical Narrative:  Patient schedled for discharge today, spoke with him to assess discharge needs/barriers. Patient states his mother will be transporting him home, when I asked if he needed assistance with getting medications, he replied no. Then asked patient if he needed anything at home that will assist health needs, patient replies no. I did advise patient to take medications as prescribed and to keep all medical appointments, patient replies yes mam. Attempted to call mother, no answer and no voice mail, advised Nurse to call me when mom arrive to get son.           Patient Goals and CMS Choice        Discharge Placement                       Discharge Plan and Services                                     Social Determinants of Health (SDOH) Interventions     Readmission Risk Interventions No flowsheet data found.

## 2021-03-11 LAB — TYPE AND SCREEN
ABO/RH(D): O POS
Antibody Screen: NEGATIVE
Unit division: 0

## 2021-03-11 LAB — BPAM RBC
Blood Product Expiration Date: 202204262359
ISSUE DATE / TIME: 202203300205
Unit Type and Rh: 5100

## 2021-03-12 LAB — CULTURE, BLOOD (ROUTINE X 2)
Culture: NO GROWTH
Culture: NO GROWTH
Special Requests: ADEQUATE
Special Requests: ADEQUATE

## 2021-03-16 ENCOUNTER — Ambulatory Visit: Payer: Self-pay | Admitting: Physical Therapy

## 2021-03-16 ENCOUNTER — Encounter: Payer: Self-pay | Admitting: Occupational Therapy

## 2021-03-18 ENCOUNTER — Encounter: Payer: Self-pay | Admitting: Occupational Therapy

## 2021-03-19 ENCOUNTER — Ambulatory Visit: Payer: Self-pay

## 2021-03-23 ENCOUNTER — Ambulatory Visit: Payer: Self-pay

## 2021-03-26 ENCOUNTER — Ambulatory Visit: Payer: Self-pay

## 2021-03-26 ENCOUNTER — Encounter: Payer: Self-pay | Admitting: Occupational Therapy

## 2021-03-30 ENCOUNTER — Encounter: Payer: Self-pay | Admitting: Occupational Therapy

## 2021-03-30 ENCOUNTER — Ambulatory Visit: Payer: Self-pay

## 2021-04-01 ENCOUNTER — Encounter: Payer: Self-pay | Admitting: Occupational Therapy

## 2021-04-01 ENCOUNTER — Telehealth: Payer: Self-pay | Admitting: *Deleted

## 2021-04-01 ENCOUNTER — Ambulatory Visit: Payer: Self-pay

## 2021-04-01 NOTE — Telephone Encounter (Signed)
[name] called and says that Erik [name] has no been able to get in with a primary care MD yet and she needs help with all of the medications that Pam prescribed when he left the hospital.  Can you refill these  meds?

## 2021-04-01 NOTE — Therapy (Unsigned)
[place] MAIN [facility] SERVICES [address] [place], [place], [address] Phone: [phone]   Fax:  [phone]  April 01, 2021    No Recipients  Occupational Therapy Discharge Summary   Patient: Erik [name] MRN: [id number] Date of Birth: 06-18-1991  Diagnosis: No diagnosis found.  Referring Provider (OT): 01/12/2021   The above patient had been seen in Occupational Therapy 1 time for the initial OT evaluation.  The treatment consisted of  OT Evaluation. The patient [id number]  Subjective:  Pt. Participated in the initial OT evaluation on 03/03/2021. Pt. Was admitted to the hospital on 03/07/2021 with sepsis secondary to pneumonia.  Functional Status at Discharge:   OT Long Term Goals - 03/03/21 1555      OT LONG TERM GOAL #1   Title Pt. will increase LUE shoulder ROM to be able to retrieve items from his closet, and cabinetry.    Baseline Eval: Left shoulder flexion 62(82), abduction: 82(95)    Time 12    Period Weeks    Status Not met   Target Date 06/02/21      OT LONG TERM GOAL #2   Title Pt. will improve standing tolerance to be able to perform 20 min. of  IADL tasks without rest breaks.    Baseline Eval: Pt. with poor activity tolerance for standing IADL tasks.    Time 12    Period Weeks    Status Not met   Target Date 06/02/21      OT LONG TERM GOAL #3   Title Pt. will improve left hand [facility] skills to be able to manipulate small objects during ADLs, and IADLs    Baseline Eval: Decreased left hand [facility] skills    Time 12    Period Weeks    Status Not met   Target Date 06/02/21      OT LONG TERM GOAL #4   Title Pt. will improve left pinch strength by 2# to be able topen bottles    Baseline Eval: Limited left hand pinch strength    Time 12    Period Weeks    Status Not met   Target Date 06/02/21      OT LONG TERM GOAL #5   Title Pt. will perform light homemaking skills with modified independence.    Baseline  Eval: Pt. is unable to perform secondary to limited endurance,a nd activity tolerance    Time 12    Period Weeks    Status Not met   Target Date 06/02/21             Sincerely,  Harrel Carina, MS, OTR/L  CC No Recipients  [place] MAIN [facility] SERVICES [address] [place], [place], [address] Phone: [phone]   Fax:  [phone]  Patient: Erik [name] MRN: [id number] Date of Birth: Feb 10, 1991

## 2021-04-02 MED ORDER — FAMOTIDINE 20 MG PO TABS: ORAL_TABLET | Freq: Every day | ORAL | 1 refills | Status: DC

## 2021-04-02 MED ORDER — ALBUTEROL SULFATE HFA 108 (90 BASE) MCG/ACT IN AERS: INHALATION_SPRAY | RESPIRATORY_TRACT | 0 refills | Status: DC

## 2021-04-02 MED ORDER — INSULIN DETEMIR 100 UNIT/ML FLEXPEN: 37.0000 [IU] | PEN_INJECTOR | Freq: Every day | SUBCUTANEOUS | 1 refills | Status: DC

## 2021-04-02 MED ORDER — INSULIN PEN NEEDLE 31G X 6 MM MISC: 1.0000 "application " | Freq: Three times a day (TID) | 2 refills | Status: DC

## 2021-04-02 MED ORDER — AMLODIPINE BESYLATE 10 MG PO TABS: 10.0000 mg | ORAL_TABLET | Freq: Every day | ORAL | 1 refills | Status: DC

## 2021-04-02 MED ORDER — ATORVASTATIN CALCIUM 80 MG PO TABS: 80.0000 mg | ORAL_TABLET | Freq: Every day | ORAL | 1 refills | Status: DC

## 2021-04-02 MED ORDER — HYDRALAZINE HCL 50 MG PO TABS: 50.0000 mg | ORAL_TABLET | Freq: Three times a day (TID) | ORAL | 1 refills | Status: DC

## 2021-04-02 MED ORDER — INSULIN ASPART 100 UNIT/ML FLEXPEN: 8.0000 [IU] | PEN_INJECTOR | Freq: Three times a day (TID) | SUBCUTANEOUS | 1 refills | Status: DC

## 2021-04-02 MED ORDER — METOPROLOL TARTRATE 100 MG PO TABS: 100.0000 mg | ORAL_TABLET | Freq: Two times a day (BID) | ORAL | 1 refills | Status: DC

## 2021-04-02 MED ORDER — TRUEPLUS LANCETS 28G MISC: 2 refills | Status: AC

## 2021-04-02 MED ORDER — CLOPIDOGREL BISULFATE 75 MG PO TABS: 75.0000 mg | ORAL_TABLET | Freq: Every day | ORAL | 1 refills | Status: DC

## 2021-04-02 NOTE — Telephone Encounter (Signed)
Notified. 

## 2021-04-02 NOTE — Telephone Encounter (Signed)
I sent these rx's to the [facility] listed in his chart

## 2021-04-06 ENCOUNTER — Ambulatory Visit: Payer: Self-pay

## 2021-04-07 ENCOUNTER — Other Ambulatory Visit: Payer: Self-pay

## 2021-04-07 ENCOUNTER — Encounter: Payer: Self-pay | Admitting: Registered Nurse

## 2021-04-07 ENCOUNTER — Other Ambulatory Visit (HOSPITAL_COMMUNITY): Payer: Self-pay

## 2021-04-07 ENCOUNTER — Encounter: Payer: Self-pay | Attending: Registered Nurse | Admitting: Registered Nurse

## 2021-04-07 VITALS — BP 191/127 | HR 103 | Temp 99.0°F | Ht 70.0 in | Wt >= 6400 oz

## 2021-04-07 DIAGNOSIS — E119 Type 2 diabetes mellitus without complications: Secondary | ICD-10-CM

## 2021-04-07 DIAGNOSIS — R5381 Other malaise: Secondary | ICD-10-CM

## 2021-04-07 DIAGNOSIS — I10 Essential (primary) hypertension: Secondary | ICD-10-CM

## 2021-04-07 MED ORDER — HYDRALAZINE HCL 50 MG PO TABS
50.0000 mg | ORAL_TABLET | Freq: Three times a day (TID) | ORAL | 1 refills | Status: DC
  Filled 2021-04-07: qty 90, 30d supply, fill #0

## 2021-04-07 MED ORDER — METOPROLOL TARTRATE 100 MG PO TABS
100.0000 mg | ORAL_TABLET | Freq: Two times a day (BID) | ORAL | 1 refills | Status: DC
  Filled 2021-04-07: qty 60, 30d supply, fill #0

## 2021-04-07 MED ORDER — AMLODIPINE BESYLATE 10 MG PO TABS: 10.0000 mg | ORAL_TABLET | Freq: Every day | ORAL | 1 refills | Status: DC

## 2021-04-07 MED ORDER — INSULIN PEN NEEDLE 32G X 4 MM MISC
0 refills | Status: DC
  Filled 2021-04-07: qty 100, 25d supply, fill #0

## 2021-04-07 MED ORDER — METOPROLOL TARTRATE 100 MG PO TABS: 100.0000 mg | ORAL_TABLET | Freq: Two times a day (BID) | ORAL | 1 refills | Status: DC

## 2021-04-07 MED ORDER — ATORVASTATIN CALCIUM 80 MG PO TABS: 80.0000 mg | ORAL_TABLET | Freq: Every day | ORAL | 1 refills | Status: DC

## 2021-04-07 MED ORDER — CLOPIDOGREL BISULFATE 75 MG PO TABS
75.0000 mg | ORAL_TABLET | Freq: Every day | ORAL | 1 refills | Status: DC
  Filled 2021-04-07: qty 30, 30d supply, fill #0

## 2021-04-07 MED ORDER — INSULIN DETEMIR 100 UNIT/ML FLEXPEN
37.0000 [IU] | PEN_INJECTOR | Freq: Every day | SUBCUTANEOUS | 1 refills | Status: DC
  Filled 2021-04-07: qty 6, 16d supply, fill #0

## 2021-04-07 MED ORDER — FAMOTIDINE 20 MG PO TABS
ORAL_TABLET | Freq: Every day | ORAL | 1 refills | Status: DC
  Filled 2021-04-07: qty 30, 30d supply, fill #0

## 2021-04-07 MED ORDER — INSULIN PEN NEEDLE 31G X 6 MM MISC
1.0000 "application " | Freq: Three times a day (TID) | 2 refills | Status: DC
  Filled 2021-04-07: qty 100, 25d supply, fill #0

## 2021-04-07 MED ORDER — INSULIN ASPART 100 UNIT/ML FLEXPEN
8.0000 [IU] | PEN_INJECTOR | Freq: Three times a day (TID) | SUBCUTANEOUS | 1 refills | Status: DC
  Filled 2021-04-07: qty 3, 12d supply, fill #0

## 2021-04-07 MED ORDER — ALBUTEROL SULFATE HFA 108 (90 BASE) MCG/ACT IN AERS
INHALATION_SPRAY | RESPIRATORY_TRACT | 0 refills | Status: DC
  Filled 2021-04-07: qty 18, 25d supply, fill #0

## 2021-04-07 NOTE — Patient Instructions (Addendum)
Please Drop off Paperwork to [facility] : He needs to be seen ASAP    Please Call Cardiology: Dr. Cristal Deer End  [phone]  Call Dr Thedore Mins, [place] [phone]   [facility]: [phone]

## 2021-04-07 NOTE — Progress Notes (Signed)
Subjective:    Patient ID: Erik [name], male    DOB: 1991-05-15, 30 y.o.   MRN: [id number]  HPI: Erik [name] is a 30 y.o. male who is here for Hospital Follow up of his Debility, Essential Hypertension and Diabetes Mellitus, new onset. He presented to [facility] on 02/21/22with complaints of progressive weakness, poor intake and lethargy for two weeks. He was admitted with Acute Hypoxemic Respiratory Failure due to COVID-19. During hospitalization he reported chest pain, EKG showing MI/STEMI, Cardiology Consulted. He had a cardiac Cath on 01/20/2021 by Dr End. LEFT HEART CATH AND CORONARY ANGIOGRAPHY. Erik [name] was admitted to inpatient rehabilitation on 02/03/2021 and discharged home on 02/26/2021. He was following HEP as tolerated.  He was admitted to [facility] on 03/07/21- 03/30-2022 for Acute Respiratory Failure with Hypoxia.  Erik [name] denies pain . He rated his pain 0. Also reports he has a good appetite.   Erik [name] arrived to office with uncontrolled hypertension stated he ran out of his anti-hypertensive medication. Erik [name] refuses ED evaluation. Anti-Hypertensive medication ordered today and was educated on medication compliance. He verbalizes understanding.   Mother in the room and voiced concerns about Erik [name] medication, unable to purchase due to financial hardship. This provider placed a call to [facility] and he has a scheduled appointment. Spoke with Pharmacy at [facility] and medications will be filled at [facility]. Erik [name] and his mother verbalizes understanding.    Pain Inventory Average Pain 0 Pain Right Now 0 My pain is no pain  In the last 24 hours, has pain interfered with the following? General activity 0 Relation with others 0 Enjoyment of life 0 What TIME of day is your pain at its worst? evening Sleep (in general) Good  Pain is worse with: no pain Pain improves with: no pain Relief from Meds:  no pain  use a walker how many minutes can you walk? no problem ability to climb steps?  no do you drive?  no  not employed: date last employed .  numbness tingling trouble walking  HFU  HFU    No family history on file. Social History   Socioeconomic History  . Marital status: Single    Spouse name: Not on file  . Number of children: Not on file  . Years of education: Not on file  . Highest education level: Not on file  Occupational History  . Not on file  Tobacco Use  . Smoking status: Never Smoker  . Smokeless tobacco: Never Used  Substance and Sexual Activity  . Alcohol use: Never  . Drug use: Never  . Sexual activity: Never  Other Topics Concern  . Not on file  Social History Narrative  . Not on file   Social Determinants of Health   Financial Resource Strain: Not on file  Food Insecurity: Not on file  Transportation Needs: Not on file  Physical Activity: Not on file  Stress: Not on file  Social Connections: Not on file   Past Surgical History:  Procedure Laterality Date  . LEFT HEART CATH AND CORONARY ANGIOGRAPHY N/A 01/20/2021   Procedure: LEFT HEART CATH AND CORONARY ANGIOGRAPHY;  Surgeon: Yvonne Kendall, MD;  Location: [facility] INVASIVE CV LAB;  Service: Cardiovascular;  Laterality: N/A;   Past Medical History:  Diagnosis Date  . Asthma   . Diabetes mellitus without complication (HCC)    BP (!) 164/126   Pulse (!) 105  Temp 99 F (37.2 C)   Ht 5\' 10"  (1.778 m)   Wt (!) 426 lb 9.6 oz (193.5 kg)   SpO2 96%   BMI 61.21 kg/m   Opioid Risk Score:   Fall Risk Score:  `1  Depression screen PHQ 2/9  Depression screen PHQ 2/9 04/07/2021  Decreased Interest 0  Down, Depressed, Hopeless 0  PHQ - 2 Score 0  Altered sleeping 0  Tired, decreased energy 1  Change in appetite 1  Feeling bad or failure about yourself  0  Trouble concentrating 0  Moving slowly or fidgety/restless 0  Suicidal thoughts 0  PHQ-9 Score 2  Difficult doing  work/chores Not difficult at all    Review of Systems  Respiratory: Positive for cough, shortness of breath and wheezing.   Musculoskeletal: Positive for gait problem.  Neurological: Positive for numbness.       Tingling  All other systems reviewed and are negative.      Objective:   Physical Exam Vitals and nursing note reviewed.  Constitutional:      Appearance: Normal appearance. He is obese.  Cardiovascular:     Rate and Rhythm: Normal rate and regular rhythm.     Pulses: Normal pulses.     Heart sounds: Normal heart sounds.  Pulmonary:     Effort: Pulmonary effort is normal.     Breath sounds: Normal breath sounds.  Musculoskeletal:     Cervical back: Normal range of motion and neck supple.     Right lower leg: Edema present.     Left lower leg: Edema present.     Comments: Normal Muscle Bulk and Muscle Testing Reveals:  Upper Extremities: Full ROM and Muscle Strength 5/5  Lower Extremities:Full ROM and Muscle Strength 5/5 Arises from Table Slowly using walker for support Narrow Based Gait   Skin:    General: Skin is warm and dry.  Neurological:     Mental Status: He is alert and oriented to person, place, and time.  Psychiatric:        Mood and Affect: Mood normal.        Behavior: Behavior normal.           Assessment & Plan:  1.Debility: Continue HEP as tolerated. Continue to monitor.  2. Uncontrolled Hypertension: Refuses ED evaluation. Antihypertensive medication ordered today. Educated on medication compliance. He verbalizes understanding.  3. Essential Hypertension: Continue current medication regimen. Medications refilled. Has a scheduled appointment with [facility]. 4.  Diabetes Mellitus, new onset: Continue current medication regimen. He has  A scheduled appointment with [facility]. F/U with Dr [facility] in 4- 6 weeks

## 2021-04-08 ENCOUNTER — Ambulatory Visit: Payer: Self-pay

## 2021-04-15 ENCOUNTER — Encounter: Payer: Self-pay | Admitting: Family

## 2021-04-15 ENCOUNTER — Ambulatory Visit (INDEPENDENT_AMBULATORY_CARE_PROVIDER_SITE_OTHER): Payer: Self-pay | Admitting: Family

## 2021-04-15 ENCOUNTER — Other Ambulatory Visit: Payer: Self-pay

## 2021-04-15 VITALS — BP 128/90 | HR 83 | Ht 70.0 in | Wt >= 6400 oz

## 2021-04-15 DIAGNOSIS — E782 Mixed hyperlipidemia: Secondary | ICD-10-CM

## 2021-04-15 DIAGNOSIS — I10 Essential (primary) hypertension: Secondary | ICD-10-CM

## 2021-04-15 DIAGNOSIS — E785 Hyperlipidemia, unspecified: Secondary | ICD-10-CM

## 2021-04-15 DIAGNOSIS — L97509 Non-pressure chronic ulcer of other part of unspecified foot with unspecified severity: Secondary | ICD-10-CM

## 2021-04-15 DIAGNOSIS — R600 Localized edema: Secondary | ICD-10-CM

## 2021-04-15 DIAGNOSIS — I252 Old myocardial infarction: Secondary | ICD-10-CM

## 2021-04-15 DIAGNOSIS — E11621 Type 2 diabetes mellitus with foot ulcer: Secondary | ICD-10-CM

## 2021-04-15 DIAGNOSIS — Z79899 Other long term (current) drug therapy: Secondary | ICD-10-CM

## 2021-04-15 NOTE — Progress Notes (Signed)
Office Visit    Patient Name: Erik [name] Date of Encounter: 04/15/2021  PCP:  Kathyrn Lass   [place]  Cardiologist:  Nelva Bush, MD  Advanced Practice Provider:  No care team member to display Electrophysiologist:  None   Chief Complaint    Erik [name] is a 30 y.o. male with a hx of asthma, morbid obesity, DM2,  COVID 24 requiring hospitalization with cardiomyopathy, morbid obesity, ventricular tachycardia, HLD, ARDS and AKI, anemia presents today for  Hospital follow up.   Past Medical History    Past Medical History:  Diagnosis Date  . Asthma   . Diabetes mellitus without complication [facility])    Past Surgical History:  Procedure Laterality Date  . LEFT HEART CATH AND CORONARY ANGIOGRAPHY N/A 01/20/2021   Procedure: LEFT HEART CATH AND CORONARY ANGIOGRAPHY;  Surgeon: Nelva Bush, MD;  Location: [place] CV LAB;  Service: Cardiovascular;  Laterality: N/A;    Allergies  No Known Allergies  History of Present Illness    Erik [name] is a 30 y.o. male with a hx of asthma, obesity, diabetes, COVID-19 with cardiomyopathy and NSTEMI, ventricular tachycardia, hyperlipidemia,/AKI, pneumonia, right foot diabetic ulcer, iron deficiency anemia, B12 deficiency anemia last seen while hospitalzed.  Erik [name] was admitted 01/12/21 for COVID 19 pneumonia and ARDS with severe acidosis and severe hypoxia with acute renal failure requiring intubation and CRRT. Treated with Remdesivir and steroid. On 01/19/21 he had STEMI with cardiac cath 01/20/21 with no significant coronary artery disease. Myocarditis was suspected. DAPT for 1 year was recommended. His EF on cardiac cath was 25% and by subsequent echocardiogram had normalized. He was diagnosed with diabetes. He was discharged to inpatient rehab where he stayed 02/03/21 - 02/26/21 and then was discharged to home.   Hospitalized 03/07/21-03/10/21 due to worsening shortness of breath with acute hypoxic respiratory  failure secondary to healthcare associated pneumonia as well as sepsis.  He was treated with IV antibiotics.  He was also noted to have symptomatic anemia with no evidence of bleeding and required 1 unit of PRBC.  He was seen by nephrology 04/12/21 and started on Torsemide 96m daily due to elevated blood pressure and lower extremity edema.   He presents today for follow up with his mother. We reviewed his cardiac testing and diagnosis in detail. They were somewhat confused on whether he truly had a heart attack and were appreciative of the explanation. We reviewed cardiac medications in detail. He reports no chest pain, pressure, tightness. Tells me his dyspnea on exertion is  Improving. [facility] and is hopeful for additional therapy services. He endorses his lower extremity edema is somewhat improved since addition of Torsemide by nephrology. He does not have a BP cuff at home and unfortunately those we have for patients in our clinic are too small for his arm and forearm measurement was inaccurate.   EKGs/Labs/Other Studies Reviewed:   The following studies were reviewed today:  TTE 01/20/2021  1. Left ventricular ejection fraction, by estimation, is 60 to 65%. The  left ventricle has normal function. The left ventricle has no regional  wall motion abnormalities. Left ventricular diastolic parameters were  normal.   2. Right ventricular systolic function is normal. The right ventricular  size is normal.   3. The mitral valve is normal in structure. No evidence of mitral valve  regurgitation. No evidence of mitral stenosis.   4.  The aortic valve is normal in structure. Aortic valve regurgitation is  not visualized. No aortic stenosis is present.   5. The inferior vena cava is normal in size with greater than 50%  respiratory variability, suggesting right atrial pressure of 3 mmHg.    LHC 01/20/2021 Conclusions: 1. Minimal  coronary artery plaquing without angiographically significant coronary artery disease. 2. Moderately reduced left ventricular contraction with global hypokinesis. 3. Mildly elevated left ventricular filling pressure (LVEDP ~20 mmHg).   Recommendations: 1. No obvious coronary artery lesion to explain ST segment changes and sustained ventricular tachycardia.  Possible causes include metabolic/electrolyte derangements and myocarditis in the setting of COVID-19. 2. Trend HS-TnI until it has peaked, then stop. 3. Obtain transthoracic echocardiogram. 4. If blood pressure tolerates, consider addition of low-dose beta-blocker. 5. If patient has recurrent VT, consider IV amiodarone. 6. Replete electrolytes to maintain K > 4.0 and Mg > 2.0.   EKG:  EKG is ordered today.  The ekg ordered today demonstrates NSR 83 bpm with left axis deviation and no acute ST/T wave changes. Poor R wave progression in anterior leads.   Recent Labs: 03/07/2021: B Natriuretic Peptide 193.3 03/08/2021: ALT 11 03/10/2021: BUN 19; Creatinine, Ser 1.45; Hemoglobin 8.0; Magnesium 2.0; Platelets 419; Potassium 3.6; Sodium 140  Recent Lipid Panel    Component Value Date/Time   CHOL 165 01/27/2021 1105   TRIG 230 (H) 01/27/2021 1105   HDL 54 01/27/2021 1105   CHOLHDL 3.1 01/27/2021 1105   VLDL 46 (H) 01/27/2021 1105   LDLCALC 65 01/27/2021 1105   Home Medications   Current Meds  Medication Sig  . albuterol (VENTOLIN HFA) 108 (90 Base) MCG/ACT inhaler INHALE 1-2 PUFFS INTO THE LUNGS EVERY SIX HOURS AS NEEDED FOR WHEEZING OR SHORTNESS OF BREATH.  Marland Kitchen amLODipine (NORVASC) 10 MG tablet Take 1 tablet (10 mg total) by mouth daily.  Marland Kitchen aspirin 81 MG chewable tablet Chew 1 tablet (81 mg total) by mouth daily.  Marland Kitchen atorvastatin (LIPITOR) 80 MG tablet Take 1 tablet (80 mg total) by mouth daily.  . blood glucose meter kit and supplies KIT Dispense based on patient and insurance preference. Use up to four times daily as directed.  .  Blood Glucose Monitoring Suppl (TRUE METRIX METER) w/Device KIT USE UP TO FOUR TIMES DAILY AS DIRECTED.  Marland Kitchen clopidogrel (PLAVIX) 75 MG tablet Take 1 tablet (75 mg total) by mouth daily.  Marland Kitchen doxycycline (VIBRAMYCIN) 100 MG capsule See admin instructions.  . famotidine (PEPCID) 20 MG tablet TAKE 1 TABLET (20 MG TOTAL) BY MOUTH DAILY.  Marland Kitchen glucose blood test strip USE UP TO FOUR TIMES DAILY AS DIRECTED (Patient taking differently: USE UP TO FOUR TIMES DAILY AS DIRECTED)  . hydrALAZINE (APRESOLINE) 50 MG tablet Take 1 tablet (50 mg total) by mouth every 8 (eight) hours.  . insulin aspart (NOVOLOG) 100 UNIT/ML FlexPen Inject 8 Units into the skin 3 (three) times daily with meals.  . insulin detemir (LEVEMIR) 100 UNIT/ML FlexPen INJECT 37 UNITS INTO THE SKIN DAILY.  Marland Kitchen Insulin Pen Needle 31G X 6 MM MISC Use as directed with breakfast, with lunch, and with evening meal.  . Insulin Pen Needle 32G X 4 MM MISC USE AS DIRECTED WITH BREAKFAST, LUNCH, AND EVENING MEAL.  . metoprolol tartrate (LOPRESSOR) 100 MG tablet Take 1 tablet (100 mg total) by mouth 2 (two) times daily.  . Multiple Vitamin (MULTIVITAMIN WITH MINERALS) TABS tablet Take 1 tablet by mouth daily.  Marland Kitchen torsemide (DEMADEX) 10 MG tablet daily  in the afternoon.  . TRUEplus Lancets 28G MISC USE UP TO FOUR TIMES DAILY AS DIRECTED     Review of Systems    All other systems reviewed and are otherwise negative except as noted above.  Physical Exam    VS:  BP 128/90 (BP Location: Left Arm, Patient Position: Sitting, Cuff Size: Large)   Pulse 83   Ht '5\' 10"'  (1.778 m)   Wt (!) 443 lb (200.9 kg)   SpO2 94%   BMI 63.56 kg/m  , BMI Body mass index is 63.56 kg/m.  Wt Readings from Last 3 Encounters:  04/15/21 (!) 443 lb (200.9 kg)  04/07/21 (!) 426 lb 9.6 oz (193.5 kg)  03/10/21 (!) 399 lb 4.1 oz (181.1 kg)     GEN: Well nourished, overweight, well developed, in no acute distress. HEENT: normal. Neck: Supple, no JVD, carotid bruits, or  masses. Cardiac: RRR, no murmurs, rubs, or gallops. No clubbing, cyanosis. Non pitting bilateral LE edema..  Radials/PT 2+ and equal bilaterally.  Respiratory:  Respirations regular and unlabored, clear to auscultation bilaterally. GI: Soft, nontender, nondistended. MS: No deformity or atrophy. Skin: Warm and dry, no rash. Neuro:  Strength and sensation are intact. Psych: Normal affect.  Assessment & Plan    1. Ventricular tachycardia in setting of COVID 19 - Treated with beta blocker during admission 01/2021. Continue Metoprolol tartrate 157m BID. Denies palpitations.   2. Elevated troponin / NSTEMI in setting of COVID19 - LHC with no significant CAD. Recommended for DAPT Asa/Plavix for 12 months. Questionable myocarditis, unable to be confirmed by MRI due to BMI. LVEF during LHC 25%. Echo 01/20/21 with LVEF 662-95% normal diastolic parameters, no significant valvualr abnormalities. No recurrent anignal symptoms. Denies bleeding complications on DAPT. GDMT includes aspirin, plavix, beta blocker, atorvastatin. Heart healthy diet and regular cardiovascular exercise encouraged.   3. Medication management - Patient assistance application provided to patient to fill out.  4. Morbid obesity - Weight loss via diet and exercise encouraged. Discussed the impact being overweight would have on cardiovascular risk.  5. HTN - BP well controlled. Continue present antihypertensive regimen.  6. LE edema - Likely venous insufficiency, dependent edema. Unable to exclude element of diastolic dysfunction. Continue Torsemide 134mQD as prescribed by nephrology. Elevation, compression, low salt diet encouraged.   7. Anemia - Continue to follow with PCP. Upcoming visit next week, anticipate repeat CBC. Likely contributory to fatigue.   8. AKI - Continue to follow with nephrology.   9. DM2 - Continue to follow with primary care provider. If additional agent needed, consider SGLT2i for cardioprotective benefit.    10. HLD, LDL goal <70- Continue Atorvastatin 8025maily.    Disposition: Follow up in 2 month(s) with Dr. EndSaunders Revel APP   Signed, CaiLoel DubonnetP 04/15/2021, 4:36 PM [place]

## 2021-04-15 NOTE — Patient Instructions (Addendum)
Medication Instructions:  No medication changes today.   *If you need a refill on your cardiac medications before your next appointment, please call your pharmacy*   Lab Work: None ordered today. Recommend CBC (to check your anemia) at your upcoming PCP appointment.   Testing/Procedures: Your EKG today showed normal sinus rhythm.    Follow-Up: At [facility], you and your health needs are our priority.  As part of our continuing mission to provide you with exceptional heart care, we have created designated Provider Care Teams.  These Care Teams include your primary Cardiologist (physician) and Advanced Practice Providers (APPs -  Physician Assistants and Nurse Practitioners) who all work together to provide you with the care you need, when you need it.  We recommend signing up for the patient portal called "MyChart".  Sign up information is provided on this After Visit Summary.  MyChart is used to connect with patients for Virtual Visits (Telemedicine).  Patients are able to view lab/test results, encounter notes, upcoming appointments, etc.  Non-urgent messages can be sent to your provider as well.   To learn more about what you can do with MyChart, go to [email or web address].    Your next appointment:   2 months  The format for your next appointment:   In Person  Provider:   You may see Yvonne Kendall, MD or one of the following Advanced Practice Providers on your designated Care Team:    Nicolasa Ducking, NP  Eula Listen, PA-C  Marisue Ivan, PA-C  Cadence Fransico Michael, [place]  Gillian Shields, NP  Other Instructions  Heart Healthy Diet Recommendations: A low-salt diet is recommended. Meats should be grilled, baked, or boiled. Avoid fried foods. Focus on lean protein sources like fish or chicken with vegetables and fruits. The American Heart Association is a Chief Technology Officer!  American Heart Association Diet and Lifeystyle Recommendations   Exercise recommendations: The  American Heart Association recommends 150 minutes of moderate intensity exercise weekly. Try 30 minutes of moderate intensity exercise 4-5 times per week. This could include walking, jogging, or swimming.  To prevent or reduce lower extremity swelling: . Eat a low salt diet. Salt makes the body hold onto extra fluid which causes swelling. . Sit with legs elevated. For example, in the recliner or on an ottoman.  . Wear knee-high compression stockings during the daytime. Ones labeled 15-20 mmHg provide good compression.

## 2021-04-15 NOTE — Progress Notes (Incomplete)
Office Visit    Patient Name: Erik [name] Date of Encounter: 04/15/2021  PCP:  Aviva Kluver   [facility]  Cardiologist:  Yvonne Kendall, MD  Advanced Practice Provider:  No care team member to display Electrophysiologist:  None   Chief Complaint    Erik [name] is a 30 y.o. male with a hx of asthma, morbid obesity, DM2,  COVID 19 requiring hospitalization with cardiomyopathy, morbid obesity, ventricular tachycardia, HLD, ARDS and AKI, anemia presents today for ***   Past Medical History    Past Medical History:  Diagnosis Date  . Asthma   . Diabetes mellitus without complication [facility])    Past Surgical History:  Procedure Laterality Date  . LEFT HEART CATH AND CORONARY ANGIOGRAPHY N/A 01/20/2021   Procedure: LEFT HEART CATH AND CORONARY ANGIOGRAPHY;  Surgeon: Yvonne Kendall, MD;  Location: [facility] INVASIVE CV LAB;  Service: Cardiovascular;  Laterality: N/A;    Allergies  No Known Allergies  History of Present Illness    Erik [name] is a 30 y.o. male with a hx of *** last seen ***.  He was seen by nephrology 04/12/21 and started on Torsemide 10mg  daily due to elevated blood pressure and lower extremity edema.    EKGs/Labs/Other Studies Reviewed:   The following studies were reviewed today:  TTE 01/20/2021  1. Left ventricular ejection fraction, by estimation, is 60 to 65%. The  left ventricle has normal function. The left ventricle has no regional  wall motion abnormalities. Left ventricular diastolic parameters were  normal.   2. Right ventricular systolic function is normal. The right ventricular  size is normal.   3. The mitral valve is normal in structure. No evidence of mitral valve  regurgitation. No evidence of mitral stenosis.   4. The aortic valve is normal in structure. Aortic valve regurgitation is  not visualized. No aortic stenosis is present.   5. The inferior vena cava is normal in size with greater than 50%  respiratory  variability, suggesting right atrial pressure of 3 mmHg.    LHC 01/20/2021 Conclusions: 1. Minimal coronary artery plaquing without angiographically significant coronary artery disease. 2. Moderately reduced left ventricular contraction with global hypokinesis. 3. Mildly elevated left ventricular filling pressure (LVEDP ~20 mmHg).   Recommendations: 1. No obvious coronary artery lesion to explain ST segment changes and sustained ventricular tachycardia.  Possible causes include metabolic/electrolyte derangements and myocarditis in the setting of COVID-19. 2. Trend HS-TnI until it has peaked, then stop. 3. Obtain transthoracic echocardiogram. 4. If blood pressure tolerates, consider addition of low-dose beta-blocker. 5. If patient has recurrent VT, consider IV amiodarone. 6. Replete electrolytes to maintain K > 4.0 and Mg > 2.0.   EKG:  EKG is ordered today.  The ekg ordered today demonstrates ***  Recent Labs: 03/07/2021: B Natriuretic Peptide 193.3 03/08/2021: ALT 11 03/10/2021: BUN 19; Creatinine, Ser 1.45; Hemoglobin 8.0; Magnesium 2.0; Platelets 419; Potassium 3.6; Sodium 140  Recent Lipid Panel    Component Value Date/Time   CHOL 165 01/27/2021 1105   TRIG 230 (H) 01/27/2021 1105   HDL 54 01/27/2021 1105   CHOLHDL 3.1 01/27/2021 1105   VLDL 46 (H) 01/27/2021 1105   LDLCALC 65 01/27/2021 1105   Home Medications   No outpatient medications have been marked as taking for the 04/15/21 encounter (Appointment) with 06/15/21, NP.     Review of Systems   ***   ROS All other systems reviewed and are otherwise negative  except as noted above.  Physical Exam    VS:  There were no vitals taken for this visit. , BMI There is no height or weight on file to calculate BMI.  Wt Readings from Last 3 Encounters:  04/07/21 (!) 426 lb 9.6 oz (193.5 kg)  03/10/21 (!) 399 lb 4.1 oz (181.1 kg)  02/26/21 (!) 394 lb 10 oz (179 kg)     GEN: Well nourished, well developed, in no acute  distress. HEENT: normal. Neck: Supple, no JVD, carotid bruits, or masses. Cardiac: ***RRR, no murmurs, rubs, or gallops. No clubbing, cyanosis, edema.  ***Radials/DP/PT 2+ and equal bilaterally.  Respiratory:  ***Respirations regular and unlabored, clear to auscultation bilaterally. GI: Soft, nontender, nondistended. MS: No deformity or atrophy. Skin: Warm and dry, no rash. Neuro:  Strength and sensation are intact. Psych: Normal affect.  Assessment & Plan    1. Ventricular tachycardia in setting of COVID 19 - Treated with beta blocker during admission 01/2021. 2. Elevated troponin / NSTEMI in setting of COVID19 - LHC with no significant CAD. Recommended for DAPT Asa/Plavix for 12 months. Questionable myocarditis, unable to be confirmed by MRI due to BMI. 3. Morbid obesity -  4. HTN -  5. DM2 -  6. HLD -   Disposition: Follow up {follow up:15908} with ***   Signed, Alver Sorrow, NP 04/15/2021, 2:37 PM [facility]

## 2021-04-21 NOTE — Progress Notes (Signed)
Patient ID: Erik [name], male   DOB: 08-03-1991, 30 y.o.   MRN: [id number]     Erik [name], is a 30 y.o. male  WUJ:[id number]  [id number]  DOB - Sep 17, 1991  Subjective:  Chief Complaint and HPI: Erik [name] is a 31 y.o. male here today to establish care and for a follow up visit After several hospitalizations/physical rehab after having Covid and acute respiratory failure; he was also diagnosed with new onset diabetes and has had complications believed to be related to [place] recent hospitalization 3/27-3/30/2022.  He also has a foot ulcer that he is seeing podiatry for.  Followed by nephrology and cardiology as well.    He is having swelling in his BLE and abdomen with continued SOB.  Of note, he has had an almost 13 pound weight change from 04/15/2021 when seen by cardiology.  No CP.  He continues to have to use a walker for ambulation.  His mom is here with him.  He is compliant with meds.  He is trying to change his diet.    From discharge summary: #1. Acute hypoxemic respiratory failure. Secondary to healthcare associated pneumonia. Sepsis secondary to healthcare associated pneumonia. Healthcare associated pneumonia. Patient met sepsis criteria time admission with significant tachycardia at 160s, tachypnea and leukocytosis. His lactic acid level was normal. Chest x-ray showed bilateral airspace opacities. Mild elevation of procalcitonin level. Patient blood culture has been negative, MRSA culture negative. Was started on iv antibiotic Was weaned off to Room air with good 02 saturation above 92% Discussed with patient about ambulating as outpatient as tolerated Continue finishing antibiotic course po    2. Symptomatic anemia. Iron deficient anemia. B12 deficient anemia. Reactive thrombocytosis. Patient has no evidence of GI bleed or other bleeding.  S/p 1 unit prbc transfusion Received b12 injections daily as inpatient Will need to get setup with pcp for further  monitoring and management   #3. Uncontrolled type 2 diabetes with hyperglycemia Right foot diabetic ulcer. 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 Wound care had evaluated patient foot ulcer, no infection. Continue home meds F/u with pcpc  #4. Morbid obesity.  5. Hypomagnesemia. Hypokalemia. Replaced and stable.  Discharge Diagnoses:  Principal Problem:   Acute respiratory failure with hypoxia (HCC) Active Problems:   Obesity, Class III, BMI 40-49.9 (morbid obesity) ([place])   Dyslipidemia   Essential hypertension   Diabetes mellitus, new onset (HCC)   Sepsis (HCC)   Acute respiratory failure (HCC)   B12 deficiency anemia   Right foot ulcer (HCC)   Iron deficiency anemia due to chronic blood loss   Healthcare-associated pneumonia   ED/Hospital notes reviewed.     ROS:   Constitutional:  No f/c, No night sweats, No unexplained weight loss. EENT:  No vision changes, No blurry vision, No hearing changes. No mouth, throat, or ear problems.  Respiratory: No cough, + SOB Cardiac: No CP, no palpitations GI:  +abdominal tightness, No N/V/D. GU: No Urinary s/sx Musculoskeletal: No joint pain Neuro: No headache, no dizziness, no motor weakness.  Skin: No rash Endocrine:  No polydipsia. No polyuria.  Psych: Denies SI/HI  No problems updated.  ALLERGIES: No Known Allergies  PAST MEDICAL HISTORY: Past Medical History:  Diagnosis Date  . Asthma   . Diabetes mellitus without complication ([place])     MEDICATIONS AT HOME: Prior to Admission medications   Medication Sig Start Date End Date Taking? Authorizing Provider  potassium chloride (KLOR-CON) 10 MEQ tablet Take 1 tablet (10 mEq total)  by mouth 2 (two) times daily. 04/22/21  Yes McClung, Dionne Bucy, PA-C  albuterol (VENTOLIN HFA) 108 (90 Base) MCG/ACT inhaler INHALE 1-2 PUFFS INTO THE LUNGS EVERY SIX HOURS AS NEEDED FOR WHEEZING OR SHORTNESS OF BREATH. 04/07/21 04/07/22  Bayard Hugger, NP   amLODipine (NORVASC) 10 MG tablet Take 1 tablet (10 mg total) by mouth daily. 04/22/21   Argentina Donovan, PA-C  aspirin 81 MG chewable tablet Chew 1 tablet (81 mg total) by mouth daily. 04/22/21   Argentina Donovan, PA-C  atorvastatin (LIPITOR) 80 MG tablet Take 1 tablet (80 mg total) by mouth daily. 04/22/21   Argentina Donovan, PA-C  blood glucose meter kit and supplies KIT Dispense based on patient and insurance preference. Use up to four times daily as directed. 02/26/21   Love, Ivan Anchors, PA-C  Blood Glucose Monitoring Suppl (TRUE METRIX METER) w/Device KIT USE UP TO FOUR TIMES DAILY AS DIRECTED. 02/26/21 02/26/22  Love, Ivan Anchors, PA-C  clopidogrel (PLAVIX) 75 MG tablet Take 1 tablet (75 mg total) by mouth daily. 04/22/21   Argentina Donovan, PA-C  famotidine (PEPCID) 20 MG tablet TAKE 1 TABLET (20 MG TOTAL) BY MOUTH DAILY. 04/22/21 04/22/22  Argentina Donovan, PA-C  ferrous sulfate 325 (65 FE) MG tablet Take 1 tablet (325 mg total) by mouth daily with breakfast. 04/22/21   Argentina Donovan, PA-C  glucose blood test strip USE UP TO FOUR TIMES DAILY AS DIRECTED Patient taking differently: USE UP TO FOUR TIMES DAILY AS DIRECTED 02/26/21 02/26/22  Love, Ivan Anchors, PA-C  hydrALAZINE (APRESOLINE) 50 MG tablet Take 1 tablet (50 mg total) by mouth every 8 (eight) hours. 04/22/21   Argentina Donovan, PA-C  insulin aspart (NOVOLOG) 100 UNIT/ML FlexPen Inject 8 Units into the skin 3 (three) times daily with meals. 04/22/21   Argentina Donovan, PA-C  insulin detemir (LEVEMIR) 100 UNIT/ML FlexPen Inject 39 Units into the skin at bedtime. 04/22/21 04/22/22  Argentina Donovan, PA-C  Insulin Pen Needle 31G X 6 MM MISC Use as directed with breakfast, with lunch, and with evening meal. 04/22/21   McClung, Dionne Bucy, PA-C  Insulin Pen Needle 32G X 4 MM MISC USE AS DIRECTED WITH BREAKFAST, LUNCH, AND EVENING MEAL. 04/22/21 04/22/22  Argentina Donovan, PA-C  metoprolol tartrate (LOPRESSOR) 100 MG tablet Take 1 tablet (100 mg total)  by mouth 2 (two) times daily. 04/22/21   Argentina Donovan, PA-C  Multiple Vitamin (MULTIVITAMIN WITH MINERALS) TABS tablet Take 1 tablet by mouth daily. 02/04/21   Lorella Nimrod, MD  torsemide (DEMADEX) 10 MG tablet Take 2 tablets by mouth daily for 5 days, then 1 tablet daily and record weight daily 04/22/21   Argentina Donovan, PA-C  TRUEplus Lancets 28G MISC USE UP TO FOUR TIMES DAILY AS DIRECTED 04/02/21 04/02/22  Meredith Staggers, MD     Objective:  EXAM:   Vitals:   04/22/21 1529  BP: (!) 173/90  Pulse: 98  Resp: 20  SpO2: 95%  Weight: (!) 455 lb 12.8 oz (206.7 kg)  Height: 5' 11" (1.803 m)    General appearance : A&OX3. NAD. Non-toxic-appearing; using a walker for ambulation HEENT: Atraumatic and Normocephalic.  PERRLA. EOM intact.  Neck: supple, no JVD. No cervical lymphadenopathy. No thyromegaly Chest/Lungs:  Breathing mildly-labored, fair air entry bilaterally, breath sounds with mild rales and wheezing, no rhonchi  CVS: S1 S2 regular, no murmurs, gallops, rubs  Abdomen: Bowel sounds present, Non tender and not  distended with no gaurding, rigidity or rebound. Extremities: Bilateral Lower Ext shows 2-3+ edema, both legs are warm to touch with = pulse throughout Neurology:  CN II-XII grossly intact, Non focal.   Psych:  TP linear. J/I WNL. Normal speech. Appropriate eye contact and affect.  Skin:  No Rash  Data Review Lab Results  Component Value Date   HGBA1C 6.6 04/22/2021   HGBA1C 11.8 (H) 01/27/2021   HGBA1C 13.6 (H) 01/12/2021     Assessment & Plan   1. Diabetes mellitus, new onset ([place]) Much improved-continue to work on diet and increase levemir from 37 to 39 units and continue regimen - Glucose (CBG) - HgB A1c - insulin aspart (NOVOLOG) 100 UNIT/ML FlexPen; Inject 8 Units into the skin 3 (three) times daily with meals.  Dispense: 15 mL; Refill: 3 - Insulin Pen Needle 31G X 6 MM MISC; Use as directed with breakfast, with lunch, and with evening meal.   Dispense: 120 each; Refill: 2 - Insulin Pen Needle 32G X 4 MM MISC; USE AS DIRECTED WITH BREAKFAST, LUNCH, AND EVENING MEAL.  Dispense: 100 each; Refill: 0 - insulin detemir (LEVEMIR) 100 UNIT/ML FlexPen; Inject 39 Units into the skin at bedtime.  Dispense: 15 mL; Refill: 3 - Comprehensive metabolic panel  2. Healthcare-associated pneumonia resolved  3. Iron deficiency anemia due to chronic blood loss - ferrous sulfate 325 (65 FE) MG tablet; Take 1 tablet (325 mg total) by mouth daily with breakfast.  Dispense: 90 tablet; Refill: 3 - famotidine (PEPCID) 20 MG tablet; TAKE 1 TABLET (20 MG TOTAL) BY MOUTH DAILY.  Dispense: 30 tablet; Refill: 1   4. Dyslipidemia - atorvastatin (LIPITOR) 80 MG tablet; Take 1 tablet (80 mg total) by mouth daily.  Dispense: 30 tablet; Refill: 2  6. Essential hypertension Not controlled but out of amlodipine.  Check BP OOO - amLODipine (NORVASC) 10 MG tablet; Take 1 tablet (10 mg total) by mouth daily.  Dispense: 30 tablet; Refill: 3 - clopidogrel (PLAVIX) 75 MG tablet; Take 1 tablet (75 mg total) by mouth daily.  Dispense: 30 tablet; Refill: 2 - hydrALAZINE (APRESOLINE) 50 MG tablet; Take 1 tablet (50 mg total) by mouth every 8 (eight) hours.  Dispense: 90 tablet; Refill: 2 - metoprolol tartrate (LOPRESSOR) 100 MG tablet; Take 1 tablet (100 mg total) by mouth 2 (two) times daily.  Dispense: 60 tablet; Refill: 2  7. Hospital discharge follow-up  8. B12 deficiency - B12 and Folate Panel - CBC with Differential/Platelet  9. Ulcer of right foot, unspecified ulcer stage ([place]) Followed by podiatry  10. Weight gain Will increase torsemide to 2 tabs daily X 5 days and add low dose K+ supplement.  Daily weights-if he does not start to diurese of if SOB worsens, low threshhold to return to ED/call 911.  Patient and his mom verbalize understanding.  Notify us or cardiology if 3 pound weight gain in a day. Reviewed with Clinical pharmacist Vinson Moselle Ausdall -  torsemide (DEMADEX) 10 MG tablet; Take 2 tablets by mouth daily for 5 days, then 1 tablet daily and record weight daily  Dispense: 60 tablet; Refill: 2 - Brain natriuretic peptide  11. Cardiomyopathy, unspecified type ([place]) - clopidogrel (PLAVIX) 75 MG tablet; Take 1 tablet (75 mg total) by mouth daily.  Dispense: 30 tablet; Refill: 2 - metoprolol tartrate (LOPRESSOR) 100 MG tablet; Take 1 tablet (100 mg total) by mouth 2 (two) times daily.  Dispense: 60 tablet; Refill: 2 - Brain natriuretic peptide   Patient have  been counseled extensively about nutrition and exercise  Return in about 6 weeks (around 06/03/2021) for assign PCP.  The patient was given clear instructions to go to ER or return to medical center if symptoms don't improve, worsen or new problems develop. The patient verbalized understanding. The patient was told to call to get lab results if they haven't heard anything in the next week.     Erik Caldron, PA-C [facility] [place], [place]   04/22/2021, 4:37 PM

## 2021-04-22 ENCOUNTER — Ambulatory Visit: Payer: Self-pay | Attending: Physician Assistant | Admitting: Physician Assistant

## 2021-04-22 ENCOUNTER — Other Ambulatory Visit: Payer: Self-pay

## 2021-04-22 ENCOUNTER — Encounter: Payer: Self-pay | Admitting: Physician Assistant

## 2021-04-22 VITALS — BP 173/90 | HR 98 | Resp 20 | Ht 71.0 in | Wt >= 6400 oz

## 2021-04-22 DIAGNOSIS — I429 Cardiomyopathy, unspecified: Secondary | ICD-10-CM

## 2021-04-22 DIAGNOSIS — D500 Iron deficiency anemia secondary to blood loss (chronic): Secondary | ICD-10-CM

## 2021-04-22 DIAGNOSIS — L97519 Non-pressure chronic ulcer of other part of right foot with unspecified severity: Secondary | ICD-10-CM

## 2021-04-22 DIAGNOSIS — R635 Abnormal weight gain: Secondary | ICD-10-CM

## 2021-04-22 DIAGNOSIS — E785 Hyperlipidemia, unspecified: Secondary | ICD-10-CM

## 2021-04-22 DIAGNOSIS — I10 Essential (primary) hypertension: Secondary | ICD-10-CM

## 2021-04-22 DIAGNOSIS — E538 Deficiency of other specified B group vitamins: Secondary | ICD-10-CM

## 2021-04-22 DIAGNOSIS — Z09 Encounter for follow-up examination after completed treatment for conditions other than malignant neoplasm: Secondary | ICD-10-CM

## 2021-04-22 DIAGNOSIS — E119 Type 2 diabetes mellitus without complications: Secondary | ICD-10-CM

## 2021-04-22 DIAGNOSIS — J189 Pneumonia, unspecified organism: Secondary | ICD-10-CM

## 2021-04-22 LAB — POCT GLYCOSYLATED HEMOGLOBIN (HGB A1C): HbA1c, POC (controlled diabetic range): 6.6 % (ref 0.0–7.0)

## 2021-04-22 LAB — GLUCOSE, POCT (MANUAL RESULT ENTRY): POC Glucose: 129 mg/dl — AB (ref 70–99)

## 2021-04-22 MED ORDER — FERROUS SULFATE 325 (65 FE) MG PO TABS
325.0000 mg | ORAL_TABLET | Freq: Every day | ORAL | 3 refills | Status: DC
  Filled 2021-04-22: qty 30, 30d supply, fill #0
  Filled 2021-05-14: qty 30, 30d supply, fill #1
  Filled 2021-06-16: qty 30, 30d supply, fill #2
  Filled 2021-07-14: qty 30, 30d supply, fill #3
  Filled 2021-08-25: qty 30, 30d supply, fill #4
  Filled 2021-09-27: qty 30, 30d supply, fill #5
  Filled 2021-10-21: qty 30, 30d supply, fill #6
  Filled 2021-11-29: qty 30, 30d supply, fill #7
  Filled 2022-01-17: qty 30, 30d supply, fill #0
  Filled 2022-01-17: qty 30, 30d supply, fill #8
  Filled 2022-02-11: qty 30, 30d supply, fill #1
  Filled 2022-03-22: qty 30, 30d supply, fill #2

## 2021-04-22 MED ORDER — POTASSIUM CHLORIDE ER 10 MEQ PO TBCR
10.0000 meq | EXTENDED_RELEASE_TABLET | Freq: Two times a day (BID) | ORAL | 3 refills | Status: DC
  Filled 2021-04-22: qty 30, 15d supply, fill #0
  Filled 2021-06-11: qty 30, 15d supply, fill #1
  Filled 2021-07-14: qty 30, 15d supply, fill #2
  Filled 2021-08-25: qty 30, 15d supply, fill #3

## 2021-04-22 MED ORDER — INSULIN PEN NEEDLE 31G X 6 MM MISC
1.0000 "application " | Freq: Three times a day (TID) | 2 refills | Status: DC
  Filled 2021-04-22: qty 100, 25d supply, fill #0
  Filled 2021-06-11 – 2021-09-27 (×2): qty 100, 25d supply, fill #1
  Filled 2022-01-17: qty 100, 25d supply, fill #2
  Filled 2022-01-17: qty 100, 25d supply, fill #0
  Filled 2022-03-12: qty 100, 25d supply, fill #1

## 2021-04-22 MED ORDER — METOPROLOL TARTRATE 100 MG PO TABS
100.0000 mg | ORAL_TABLET | Freq: Two times a day (BID) | ORAL | 2 refills | Status: DC
  Filled 2021-04-22: qty 60, 30d supply, fill #0
  Filled 2021-06-11: qty 60, 30d supply, fill #1

## 2021-04-22 MED ORDER — HYDRALAZINE HCL 50 MG PO TABS
50.0000 mg | ORAL_TABLET | Freq: Three times a day (TID) | ORAL | 2 refills | Status: DC
Start: 2021-04-22 — End: 2021-09-30
  Filled 2021-04-22: qty 90, 30d supply, fill #0
  Filled 2021-08-04: qty 90, 30d supply, fill #1
  Filled 2021-09-27: qty 90, 30d supply, fill #2

## 2021-04-22 MED ORDER — TORSEMIDE 10 MG PO TABS
ORAL_TABLET | ORAL | 2 refills | Status: DC
  Filled 2021-04-22: qty 60, 55d supply, fill #0

## 2021-04-22 MED ORDER — AMLODIPINE BESYLATE 10 MG PO TABS
10.0000 mg | ORAL_TABLET | Freq: Every day | ORAL | 3 refills | Status: DC
  Filled 2021-04-22: qty 30, 30d supply, fill #0

## 2021-04-22 MED ORDER — ATORVASTATIN CALCIUM 80 MG PO TABS
80.0000 mg | ORAL_TABLET | Freq: Every day | ORAL | 2 refills | Status: DC
  Filled 2021-04-22: qty 30, 30d supply, fill #0
  Filled 2021-06-11: qty 30, 30d supply, fill #1
  Filled 2021-07-14: qty 30, 30d supply, fill #2

## 2021-04-22 MED ORDER — INSULIN PEN NEEDLE 32G X 4 MM MISC
0 refills | Status: AC
  Filled 2021-04-22: qty 100, fill #0
  Filled 2022-03-14: qty 100, 30d supply, fill #0

## 2021-04-22 MED ORDER — INSULIN ASPART 100 UNIT/ML FLEXPEN
8.0000 [IU] | PEN_INJECTOR | Freq: Three times a day (TID) | SUBCUTANEOUS | 3 refills | Status: DC
  Filled 2021-04-22: qty 6, 25d supply, fill #0
  Filled 2021-06-11: qty 6, 25d supply, fill #1
  Filled 2021-07-14: qty 6, 25d supply, fill #2
  Filled 2021-11-30: qty 6, 25d supply, fill #3
  Filled 2022-02-11: qty 6, 25d supply, fill #0

## 2021-04-22 MED ORDER — INSULIN DETEMIR 100 UNIT/ML FLEXPEN
39.0000 [IU] | PEN_INJECTOR | Freq: Every day | SUBCUTANEOUS | 3 refills | Status: DC
  Filled 2021-04-22: qty 12, 30d supply, fill #0
  Filled 2021-06-11: qty 12, 30d supply, fill #1
  Filled 2021-07-14: qty 12, 30d supply, fill #2
  Filled 2021-09-03: qty 12, 30d supply, fill #3
  Filled 2021-11-30: qty 12, 30d supply, fill #4

## 2021-04-22 MED ORDER — FAMOTIDINE 20 MG PO TABS
ORAL_TABLET | Freq: Every day | ORAL | 1 refills | Status: DC
  Filled 2021-04-22: qty 30, fill #0
  Filled 2021-05-12: qty 30, 30d supply, fill #0
  Filled 2021-06-11: qty 30, 30d supply, fill #1

## 2021-04-22 MED ORDER — ASPIRIN 81 MG PO CHEW
81.0000 mg | CHEWABLE_TABLET | Freq: Every day | ORAL | 0 refills | Status: AC
  Filled 2021-04-22: qty 100, 100d supply, fill #0

## 2021-04-22 MED ORDER — CLOPIDOGREL BISULFATE 75 MG PO TABS
75.0000 mg | ORAL_TABLET | Freq: Every day | ORAL | 2 refills | Status: DC
Start: 2021-04-22 — End: 2021-05-04
  Filled 2021-04-22: qty 30, 30d supply, fill #0

## 2021-04-22 NOTE — Patient Instructions (Addendum)
If shortness of breath worsens or does not begin to improve, go to the emergency dept or call 911.

## 2021-04-23 ENCOUNTER — Other Ambulatory Visit: Payer: Self-pay

## 2021-04-23 ENCOUNTER — Telehealth: Payer: Self-pay | Admitting: Family

## 2021-04-23 LAB — BRAIN NATRIURETIC PEPTIDE: BNP: 235.2 pg/mL — ABNORMAL HIGH (ref 0.0–100.0)

## 2021-04-23 LAB — COMPREHENSIVE METABOLIC PANEL
ALT: 12 IU/L (ref 0–44)
AST: 10 IU/L (ref 0–40)
Albumin/Globulin Ratio: 1.3 (ref 1.2–2.2)
Albumin: 3.5 g/dL — ABNORMAL LOW (ref 4.1–5.2)
Alkaline Phosphatase: 124 IU/L — ABNORMAL HIGH (ref 44–121)
BUN/Creatinine Ratio: 9 (ref 9–20)
BUN: 13 mg/dL (ref 6–20)
Bilirubin Total: 0.3 mg/dL (ref 0.0–1.2)
CO2: 23 mmol/L (ref 20–29)
Calcium: 8.9 mg/dL (ref 8.7–10.2)
Chloride: 108 mmol/L — ABNORMAL HIGH (ref 96–106)
Creatinine, Ser: 1.42 mg/dL — ABNORMAL HIGH (ref 0.76–1.27)
Globulin, Total: 2.7 g/dL (ref 1.5–4.5)
Glucose: 119 mg/dL — ABNORMAL HIGH (ref 65–99)
Potassium: 4.1 mmol/L (ref 3.5–5.2)
Sodium: 145 mmol/L — ABNORMAL HIGH (ref 134–144)
Total Protein: 6.2 g/dL (ref 6.0–8.5)
eGFR: 68 mL/min/{1.73_m2} (ref >59)

## 2021-04-23 LAB — CBC WITH DIFFERENTIAL/PLATELET
Basophils Absolute: 0 10*3/uL (ref 0.0–0.2)
Basos: 0 %
EOS (ABSOLUTE): 0.2 10*3/uL (ref 0.0–0.4)
Eos: 2 %
Hematocrit: 25.7 % — ABNORMAL LOW (ref 37.5–51.0)
Hemoglobin: 7.4 g/dL — ABNORMAL LOW (ref 13.0–17.7)
Immature Grans (Abs): 0 10*3/uL (ref 0.0–0.1)
Immature Granulocytes: 0 %
Lymphocytes Absolute: 1.5 10*3/uL (ref 0.7–3.1)
Lymphs: 14 %
MCH: 23.1 pg — ABNORMAL LOW (ref 26.6–33.0)
MCHC: 28.8 g/dL — ABNORMAL LOW (ref 31.5–35.7)
MCV: 80 fL (ref 79–97)
Monocytes Absolute: 1.1 10*3/uL — ABNORMAL HIGH (ref 0.1–0.9)
Monocytes: 11 %
Neutrophils Absolute: 7.3 10*3/uL — ABNORMAL HIGH (ref 1.4–7.0)
Neutrophils: 73 %
Platelets: 370 10*3/uL (ref 150–450)
RBC: 3.2 x10E6/uL — ABNORMAL LOW (ref 4.14–5.80)
RDW: 14.4 % (ref 11.6–15.4)
WBC: 10.1 10*3/uL (ref 3.4–10.8)

## 2021-04-23 LAB — B12 AND FOLATE PANEL
Folate: 4 ng/mL (ref >3.0)
Vitamin B-12: 503 pg/mL (ref 232–1245)

## 2021-04-23 NOTE — Telephone Encounter (Signed)
Reviewed recommendations and scheduled follow up with APP. Discussed diet, fluid intake, and importance of daily weights. Requested they keep a log of weights to bring in at upcoming appointment. They verbalized understanding, agreement with plan, and had no further questions at this time.

## 2021-04-23 NOTE — Telephone Encounter (Signed)
Patient's mother is calling to discuss Torsemide increase.

## 2021-04-23 NOTE — Telephone Encounter (Signed)
Agree with recommendations as provided by PCP. Please schedule him for follow up in our office in 1-2 weeks with Dr. Okey Dupre or APP to reassess volume status. Important to weigh daily and report weight gain of 2 pounds overnight or 5 pounds in one week. Also, recommend <2L fluid intake per day and low salt diet.   Alver Sorrow, NP

## 2021-04-23 NOTE — Telephone Encounter (Signed)
Patient gave verbal consent to speak with his mother. She states that he went to see primary care yesterday and they made some changes to his torsemide dosing and they requested that she call and update [place] on those changes. She also reports some weight gain as well.   04/07/21 426 04/15/21 443 04/22/21 455  Primary care provider did document changes for patients torsemide. Advised I will update provider and if any further recommendations I will call back. If no changes then continue to follow instructions provided by primary care provider. She verbalized understanding of our conversation, agreement with plan, and had no further questions at this time.

## 2021-04-26 ENCOUNTER — Other Ambulatory Visit: Payer: Self-pay

## 2021-04-27 ENCOUNTER — Other Ambulatory Visit: Payer: Self-pay

## 2021-04-30 ENCOUNTER — Telehealth: Payer: Self-pay | Admitting: Family

## 2021-04-30 ENCOUNTER — Telehealth: Payer: Self-pay

## 2021-04-30 NOTE — Telephone Encounter (Signed)
Patients mother calling in wanting provider to review lab results from 5/12. Patient saw a wellness center doctor who advised patient to call

## 2021-05-03 NOTE — Telephone Encounter (Signed)
Error

## 2021-05-05 ENCOUNTER — Encounter: Payer: Self-pay | Attending: Registered Nurse | Admitting: Physical Medicine & Rehabilitation

## 2021-05-05 ENCOUNTER — Other Ambulatory Visit: Payer: Self-pay

## 2021-05-05 ENCOUNTER — Encounter: Payer: Self-pay | Admitting: Physical Medicine & Rehabilitation

## 2021-05-05 VITALS — BP 137/79 | HR 100 | Temp 98.9°F | Ht 71.0 in | Wt >= 6400 oz

## 2021-05-05 DIAGNOSIS — L97512 Non-pressure chronic ulcer of other part of right foot with fat layer exposed: Secondary | ICD-10-CM | POA: Insufficient documentation

## 2021-05-05 DIAGNOSIS — R5381 Other malaise: Secondary | ICD-10-CM | POA: Insufficient documentation

## 2021-05-05 NOTE — Progress Notes (Signed)
Subjective:    Patient ID: Erik [name], male    DOB: 05-17-1991, 30 y.o.   MRN: [id number]  HPI Erik [name] is here in follow-up of his inpatient rehab stay and associated critical illness myopathy.  After he left [place] he ended up getting back into the hospital with pneumonia.  Since being home he has been dealing with increasing edema and fluid overload.  He was placed on Demadex by his primary with some improvements in diuresis noted.  He has been followed by nephrology as well.  Further complicating factors is that he has been anemic with most recent hemoglobin 7.4.  Additionally he is developed a wound on his left foot for which she is followed by podiatry.  He never got into therapy because of some delays and due to his hospitalization.  He never sought a second referral and has been doing some walking on his own at home as exercise.  His pain levels are reasonable.  He denies any shortness of breath at rest.  Ambulation is difficult at times due to the edema but has improved somewhat with the weight loss.  Pain Inventory Average Pain 0 Pain Right Now 0 My pain is sharp  LOCATION OF PAIN  Both feet  BOWEL Number of stools per week:7 Oral laxative use Yes  Type of laxative ducolax Enema or suppository use No  History of colostomy No  Incontinent No   BLADDER Pads In and out cath, frequency n/a Able to self cath No  Bladder incontinence No  Frequent urination Yes  Leakage with coughing No  Difficulty starting stream No  Incomplete bladder emptying No    Mobility walk with assistance use a walker how many minutes can you walk? unsure ability to climb steps?  no do you drive?  no Do you have any goals in this area?  yes  Function employed # of hrs/week not working Do you have any goals in this area?  yes  Neuro/Psych weakness numbness tingling trouble walking  Prior Studies Any changes since last visit?  yes x-rays right foot xray  Physicians involved in your  care Any changes since last visit?  yes Dr. Ether Griffins, Dr. Thedore Mins, Dr. Okey Dupre.   Family History  Problem Relation Age of Onset  . Heart Problems Maternal Grandmother   . Heart attack Maternal Grandmother   . Heart disease Maternal Grandmother    Social History   Socioeconomic History  . Marital status: Single    Spouse name: Not on file  . Number of children: Not on file  . Years of education: Not on file  . Highest education level: Not on file  Occupational History  . Not on file  Tobacco Use  . Smoking status: Never Smoker  . Smokeless tobacco: Never Used  Vaping Use  . Vaping Use: Never used  Substance and Sexual Activity  . Alcohol use: Never  . Drug use: Never  . Sexual activity: Never  Other Topics Concern  . Not on file  Social History Narrative  . Not on file   Social Determinants of Health   Financial Resource Strain: Not on file  Food Insecurity: Not on file  Transportation Needs: Not on file  Physical Activity: Not on file  Stress: Not on file  Social Connections: Not on file   Past Surgical History:  Procedure Laterality Date  . LEFT HEART CATH AND CORONARY ANGIOGRAPHY N/A 01/20/2021   Procedure: LEFT HEART CATH AND CORONARY ANGIOGRAPHY;  Surgeon: Yvonne Kendall, MD;  Location: [facility] INVASIVE CV LAB;  Service: Cardiovascular;  Laterality: N/A;   Past Medical History:  Diagnosis Date  . Asthma   . Diabetes mellitus without complication (HCC)    BP 137/79   Pulse 100   Temp 98.9 F (37.2 C)   Ht 5\' 11"  (1.803 m)   Wt (!) 437 lb (198.2 kg)   SpO2 96%   BMI 60.95 kg/m   Opioid Risk Score:   Fall Risk Score:  `1  Depression screen PHQ 2/9  Depression screen [facility] 2/9 05/05/2021 04/22/2021 04/07/2021  Decreased Interest 0 0 0  Down, Depressed, Hopeless 0 0 0  PHQ - 2 Score 0 0 0  Altered sleeping - 0 0  Tired, decreased energy - 0 1  Change in appetite - 0 1  Feeling bad or failure about yourself  - 0 0  Trouble concentrating - 0 0  Moving slowly  or fidgety/restless - 0 0  Suicidal thoughts - 0 0  PHQ-9 Score - 0 2  Difficult doing work/chores - - Not difficult at all   Review of Systems  Cardiovascular: Positive for chest pain and leg swelling.  Genitourinary: Positive for frequency.  Musculoskeletal: Positive for gait problem.  Skin: Positive for wound.       Right foot wound  Neurological: Positive for weakness.  All other systems reviewed and are negative.      Objective:   Physical Exam  Gen: no distress, normal appearing, morbidly obese HEENT: oral mucosa pink and moist, NCAT Cardio: Reg rate Chest: normal effort, normal rate of breathing Abd: soft, non-distended Ext: edematous LE>UE. 2+  Psych: pleasant, normal affect Skin: stage 2 2.5 cm with granulation along left 5th MT head, callus around the periphery of the wound Neuro: Motor 5/5, decreased sensation distally,  Musculoskeletal: wide based gait. Uses walker for gait      Assessment & Plan:  1.  Debility secondary to COVID-19 with course complicated by MI/STEMI as well as AKI.   -continue HEP. Consider outpt therapies once he's dealt with his LE edema 2.   Pain Management: Controlled             on no meds.  3. Chronic edema:  -likely multifactorial given nutritional status, obesity, renal, cardiac.   -recent echo noted EF of 60-65%  -recent BNP's were elevated  -diuresis, ,mgt per primary, cards f/u pending  -advised elevation of legs in chair/bed  -ACE wrap from feet to knees daily 4. Skin: continue calcium alginate to left 5th MT wound  -offload as possible  -diabetes control  -keep wound clean 5.  Acute on chronic kidney disease 6.  Super obesity.  BMI 53.52 dietary follow-up 7..  Newly diagnosed uncontrolled diabetes mellitus.    8.  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 tach.  Cardiology follow-up.  Cardiac catheterization 01/20/2021 no significant CAD.   9.  Anemia:most recent hgb 7.4 with low iron level.  Ferritin not checked, b12 and folate levels normal  -suspect more chronic disease related then blood loss related.   -recommended iron panel with ferritin, f/u with primary    -stool for OB?   Fifteen minutes of face to face patient care time were spent during this visit. All questions were encouraged and answered.  Follow up with me PRN

## 2021-05-05 NOTE — Patient Instructions (Signed)
ANEMIA PANEL INCLUDING: IRON LEVEL TIBC FERRITIN     KEEP YOUR CALCIUM ALGINATE ON YOUR FOOT, GENTLY SCRUB EDGES TO REMOVE DRY SKIN WHEN YOU CHANGE DRESSINGS  KEEP YOUR LEGS ELEVATED, WRAPPED WITH ACE WRAP AT NIGHT OR WHEN YOU HAVE DOWN TIME  ADD SOME PROTEIN TO YOUR DIET

## 2021-05-06 NOTE — Telephone Encounter (Signed)
Pt seen in office with Luther Parody 04/15/21 and was advised to have CBC at upcoming PCP visit the following week.   Pt w/ h/o anemia and fatigue.  Will forward to provider to review labs.

## 2021-05-06 NOTE — Telephone Encounter (Signed)
Renal function overall stable. BNP shows mild fluid elevation for which primary care appropriately increased fluid pil for 5 days to Torsemide 20mg  daily. He should be back on 10mg  dose. His blood counts were low which can contribute to fluid retention - he should continue iron tablet as prescribed.   Follow up as scheduled 05/11/21. If his weight, swelling have not improved - recommend Torsemide 20mg  daily until follow up 05/11/21. If edema improved, continue Torsemide 10mg  daily.  Recommend repeat lab work at 05/13/21 prior to his appointment 05/11/21 including CBC, BMP, BNP, anemia panel. If he arrives 30-45 minutes prior to his scheduled [facility] it should give adequate time.  05/13/21, NP

## 2021-05-06 NOTE — Telephone Encounter (Signed)
Attempted to call pt to review results and provider's recc.  No answer. Lmtcb.  

## 2021-05-07 NOTE — Telephone Encounter (Signed)
Attempted to call pt to review results. No answer. Lmtcb.  

## 2021-05-11 ENCOUNTER — Ambulatory Visit (INDEPENDENT_AMBULATORY_CARE_PROVIDER_SITE_OTHER): Payer: Self-pay | Admitting: Family

## 2021-05-11 ENCOUNTER — Other Ambulatory Visit: Payer: Self-pay | Admitting: Pharmacy Technician

## 2021-05-11 ENCOUNTER — Other Ambulatory Visit: Payer: Self-pay

## 2021-05-11 ENCOUNTER — Other Ambulatory Visit
Admission: RE | Admit: 2021-05-11 | Discharge: 2021-05-11 | Disposition: A | Discharge status: Home / Self Care | Payer: Self-pay | Source: Ambulatory Visit | Attending: Family | Admitting: Family

## 2021-05-11 ENCOUNTER — Telehealth: Payer: Self-pay | Admitting: Licensed Clinical Social Worker

## 2021-05-11 ENCOUNTER — Other Ambulatory Visit: Payer: Self-pay | Admitting: Physician Assistant

## 2021-05-11 ENCOUNTER — Encounter: Payer: Self-pay | Admitting: Family

## 2021-05-11 VITALS — BP 134/80 | HR 86 | Ht 70.0 in | Wt >= 6400 oz

## 2021-05-11 DIAGNOSIS — L97509 Non-pressure chronic ulcer of other part of unspecified foot with unspecified severity: Secondary | ICD-10-CM

## 2021-05-11 DIAGNOSIS — E11621 Type 2 diabetes mellitus with foot ulcer: Secondary | ICD-10-CM

## 2021-05-11 DIAGNOSIS — R600 Localized edema: Secondary | ICD-10-CM

## 2021-05-11 DIAGNOSIS — I10 Essential (primary) hypertension: Secondary | ICD-10-CM

## 2021-05-11 DIAGNOSIS — E782 Mixed hyperlipidemia: Secondary | ICD-10-CM

## 2021-05-11 DIAGNOSIS — I252 Old myocardial infarction: Secondary | ICD-10-CM

## 2021-05-11 LAB — CBC
HCT: 27.2 % — ABNORMAL LOW (ref 39.0–52.0)
Hemoglobin: 8 g/dL — ABNORMAL LOW (ref 13.0–17.0)
MCH: 23.3 pg — ABNORMAL LOW (ref 26.0–34.0)
MCHC: 29.4 g/dL — ABNORMAL LOW (ref 30.0–36.0)
MCV: 79.1 fL — ABNORMAL LOW (ref 80.0–100.0)
Platelets: 344 10*3/uL (ref 150–400)
RBC: 3.44 MIL/uL — ABNORMAL LOW (ref 4.22–5.81)
RDW: 15.9 % — ABNORMAL HIGH (ref 11.5–15.5)
WBC: 7.8 10*3/uL (ref 4.0–10.5)
nRBC: 0 % (ref 0.0–0.2)

## 2021-05-11 LAB — BASIC METABOLIC PANEL
Anion gap: 8 (ref 5–15)
BUN: 18 mg/dL (ref 6–20)
CO2: 26 mmol/L (ref 22–32)
Calcium: 8.9 mg/dL (ref 8.9–10.3)
Chloride: 107 mmol/L (ref 98–111)
Creatinine, Ser: 1.34 mg/dL — ABNORMAL HIGH (ref 0.61–1.24)
GFR, Estimated: >60 mL/min (ref >60)
Glucose, Bld: 99 mg/dL (ref 70–99)
Potassium: 4 mmol/L (ref 3.5–5.1)
Sodium: 141 mmol/L (ref 135–145)

## 2021-05-11 LAB — BRAIN NATRIURETIC PEPTIDE: B Natriuretic Peptide: 245.5 pg/mL — ABNORMAL HIGH (ref 0.0–100.0)

## 2021-05-11 MED ORDER — AMLODIPINE BESYLATE 5 MG PO TABS
5.0000 mg | ORAL_TABLET | Freq: Every day | ORAL | 2 refills | Status: DC
Start: 2021-05-11 — End: 2021-06-15
  Filled 2021-05-11: qty 30, 30d supply, fill #0
  Filled 2021-06-11: qty 30, 30d supply, fill #1

## 2021-05-11 MED ORDER — TORSEMIDE 20 MG PO TABS
20.0000 mg | ORAL_TABLET | Freq: Every day | ORAL | 1 refills | Status: DC
  Filled 2021-05-11: qty 30, 30d supply, fill #0
  Filled 2021-06-11: qty 30, 30d supply, fill #1
  Filled 2021-07-14: qty 30, 30d supply, fill #2

## 2021-05-11 NOTE — Telephone Encounter (Addendum)
Results reviewed with pt today at office visit with Gillian Shields, NP.  DPR was updated while pt in office.

## 2021-05-11 NOTE — Telephone Encounter (Signed)
  Notes to clinic:medication was filled by a historical provider  Review for refill  Patient has upcoming appt on 06/08/2021  Requested Prescriptions  Pending Prescriptions Disp Refills   glucose blood test strip 100 each     Sig: USE UP TO FOUR TIMES DAILY AS DIRECTED      Endocrinology: Diabetes - Testing Supplies Passed - 05/11/2021 11:28 AM      Passed - Valid encounter within last 12 months    Recent Outpatient Visits           2 weeks ago Diabetes mellitus, new onset [place])   [facility] [place], [place]ella [place], [place]       Future Appointments             In 4 weeks Hoy Register, MD [facility]   In 1 month Furth, Cadence H, PA-C [facility]               famotidine (PEPCID) 20 MG tablet      Sig: Take by mouth.      Gastroenterology:  H2 Antagonists Passed - 05/11/2021 11:28 AM      Passed - Valid encounter within last 12 months    Recent Outpatient Visits           2 weeks ago Diabetes mellitus, new onset [facility])   [facility] [place], [place]zella Sch[place], [place]       Future Appointments             In 4 weeks Hoy Register, MD [facility]   In 1 month Furth, Cadence H, PA-C [facility], [facility]

## 2021-05-11 NOTE — Patient Instructions (Addendum)
Medication Instructions:  Your physician has recommended you make the following change in your medication:   REDUCE Amlodipine to 5mg  daily  CHANGE Torsemide to 30mg  daily for 3 days then change to 20mg  daily  CONTINUE Potassium twice daily  *If you need a refill on your cardiac medications before your next appointment, please call your pharmacy*   Lab Work: Your provider recommends lab work today: BNP, BMP, CBC  Your provider recommends that you return for lab work in 2 weeks for BMP -  Please go to the [facility]. You will check in at the front desk to the right as you walk into the atrium. Valet Parking is offered if needed. - No appointment needed. You may go any day between 7 am and 6 pm.   If you have labs (blood work) drawn today and your tests are completely normal, you will receive your results only by: MyChart Message (if you have MyChart) OR . A paper copy in the mail If you have any lab test that is abnormal or we need to change your treatment, we will call you to review the results.  Testing/Procedures: Your EKG today showed normal sinus rhythm.   Follow-Up: At [facility], you and your health needs are our priority.  As part of our continuing mission to provide you with exceptional heart care, we have created designated Provider Care Teams.  These Care Teams include your primary Cardiologist (physician) and Advanced Practice Providers (APPs -  Physician Assistants and Nurse Practitioners) who all work together to provide you with the care you need, when you need it.  We recommend signing up for the patient portal called "MyChart".  Sign up information is provided on this After Visit Summary.  MyChart is used to connect with patients for Virtual Visits (Telemedicine).  Patients are able to view lab/test results, encounter notes, upcoming appointments, etc.  Non-urgent messages can be sent to your provider as well.   To learn more about what you can do  with MyChart, go to [facility].    Your next appointment:   As scheduled  Other Instructions  Heart Healthy Diet Recommendations: A low-salt diet is recommended. Meats should be grilled, baked, or boiled. Avoid fried foods. Focus on lean protein sources like fish or chicken with vegetables and fruits. The American Heart Association is a Marland Kitchen!  American Heart Association Diet and Lifeystyle Recommendations   Exercise recommendations: The American Heart Association recommends 150 minutes of moderate intensity exercise weekly. Try 30 minutes of moderate intensity exercise 4-5 times per week. This could include walking, jogging, or swimming.

## 2021-05-11 NOTE — Progress Notes (Signed)
Office Visit    Patient Name: Erik [name] Date of Encounter: 05/11/2021  PCP:  Kathyrn Lass   [place]  Cardiologist:  Nelva Bush, MD  Advanced Practice Provider:  No care team member to display Electrophysiologist:  None   Chief Complaint    Erik [name] is a 30 y.o. male with a hx of asthma, morbid obesity, DM2,  COVID 55 requiring hospitalization with cardiomyopathy, morbid obesity, ventricular tachycardia, HLD, ARDS and AKI, anemia presents today for follow up of edema.  Past Medical History    Past Medical History:  Diagnosis Date  . Asthma   . Diabetes mellitus without complication [facility])    Past Surgical History:  Procedure Laterality Date  . CARDIAC CATHETERIZATION    . LEFT HEART CATH AND CORONARY ANGIOGRAPHY N/A 01/20/2021   Procedure: LEFT HEART CATH AND CORONARY ANGIOGRAPHY;  Surgeon: Nelva Bush, MD;  Location: [facility];  Service: Cardiovascular;  Laterality: N/A;    Allergies  No Known Allergies  History of Present Illness    Erik PEZZULLO is a 30 y.o. male with a hx of asthma, obesity, diabetes, COVID-19 with cardiomyopathy and NSTEMI, ventricular tachycardia, hyperlipidemia,/AKI, pneumonia, right foot diabetic ulcer, iron deficiency anemia, B12 deficiency anemia last seen 04/15/21.  Mr. Farnan was admitted 01/12/21 for COVID 19 pneumonia and ARDS with severe acidosis and severe hypoxia with acute renal failure requiring intubation and CRRT. Treated with Remdesivir and steroid. On 01/19/21 he had STEMI with cardiac cath 01/20/21 with no significant coronary artery disease. Myocarditis was suspected. DAPT for 1 year was recommended. His EF on cardiac cath was 25% and by subsequent echocardiogram had normalized. He was diagnosed with diabetes. He was discharged to inpatient rehab where he stayed 02/03/21 - 02/26/21 and then was discharged to home.   Hospitalized 03/07/21-03/10/21 due to worsening shortness of breath with acute hypoxic  respiratory failure secondary to healthcare associated pneumonia as well as sepsis.  He was treated with IV antibiotics.  He was also noted to have symptomatic anemia with no evidence of bleeding and required 1 unit of PRBC.  He was seen by nephrology 04/12/21 and started on Torsemide 66m daily due to elevated blood pressure and lower extremity edema.   Seen in follow up 04/15/20 with dyspnea improving and LE edema improved after addition of Torsemide. No changes were made at that time.  He subsequently saw PCP 04/22/21 with 13 pounds weight gain and worsening edema. Torsemide was increased to 228mdaily x5 days. 04/22/21 K 4.1, creatinine 1.42, GFR 68, BNP 235.2, Hb 7.4.  He presents today for follow-up with his mother.  Notes his lower extremity and abdominal edema is improving but not yet back to baseline.  He is up 10 pounds compared to clinic visit earlier this month.  Endorses trying to restrict less than 2 L of fluid per day though often drinks more.  Eats out as well as at home we discussed that eating out of the home could lead to increased sodium intake.  Reports some orthopnea when his swelling was worse but this has since resolved.  He sleeps on the couch with 2-4 pillows at his baseline.  Reports no chest pain, pressure, tightness.  Denies PND.  Tells me his dyspnea on exertion is overall stable.   EKGs/Labs/Other Studies Reviewed:   The following studies were reviewed today:  TTE 01/20/2021  1. Left ventricular ejection fraction, by estimation, is 60 to 65%. The  left ventricle  has normal function. The left ventricle has no regional  wall motion abnormalities. Left ventricular diastolic parameters were  normal.   2. Right ventricular systolic function is normal. The right ventricular  size is normal.   3. The mitral valve is normal in structure. No evidence of mitral valve  regurgitation. No evidence of mitral stenosis.   4. The aortic valve is normal in structure. Aortic valve  regurgitation is  not visualized. No aortic stenosis is present.   5. The inferior vena cava is normal in size with greater than 50%  respiratory variability, suggesting right atrial pressure of 3 mmHg.    LHC 01/20/2021 Conclusions: 1. Minimal coronary artery plaquing without angiographically significant coronary artery disease. 2. Moderately reduced left ventricular contraction with global hypokinesis. 3. Mildly elevated left ventricular filling pressure (LVEDP ~20 mmHg).   Recommendations: 1. No obvious coronary artery lesion to explain ST segment changes and sustained ventricular tachycardia.  Possible causes include metabolic/electrolyte derangements and myocarditis in the setting of COVID-19. 2. Trend HS-TnI until it has peaked, then stop. 3. Obtain transthoracic echocardiogram. 4. If blood pressure tolerates, consider addition of low-dose beta-blocker. 5. If patient has recurrent VT, consider IV amiodarone. 6. Replete electrolytes to maintain K > 4.0 and Mg > 2.0.   EKG:  EKG is ordered today.  The ekg ordered today demonstrates NSR 86 with incomplete RBBB and poor R wave progression in anterior leads.   Recent Labs: 03/10/2021: Magnesium 2.0 04/22/2021: ALT 12; BNP 235.2; BUN 13; Creatinine, Ser 1.42; Hemoglobin 7.4; Platelets 370; Potassium 4.1; Sodium 145  Recent Lipid Panel    Component Value Date/Time   CHOL 165 01/27/2021 1105   TRIG 230 (H) 01/27/2021 1105   HDL 54 01/27/2021 1105   CHOLHDL 3.1 01/27/2021 1105   VLDL 46 (H) 01/27/2021 1105   LDLCALC 65 01/27/2021 1105   Home Medications   Current Meds  Medication Sig  . albuterol (VENTOLIN HFA) 108 (90 Base) MCG/ACT inhaler INHALE 1-2 PUFFS INTO THE LUNGS EVERY SIX HOURS AS NEEDED FOR WHEEZING OR SHORTNESS OF BREATH.  Marland Kitchen amLODipine (NORVASC) 5 MG tablet Take 1 tablet (5 mg total) by mouth daily.  Marland Kitchen aspirin 81 MG chewable tablet Chew 1 tablet (81 mg total) by mouth daily.  Marland Kitchen atorvastatin (LIPITOR) 80 MG tablet Take 1  tablet (80 mg total) by mouth daily.  . blood glucose meter kit and supplies KIT Dispense based on patient and insurance preference. Use up to four times daily as directed.  . Blood Glucose Monitoring Suppl (TRUE METRIX METER) w/Device KIT USE UP TO FOUR TIMES DAILY AS DIRECTED.  Marland Kitchen clopidogrel (PLAVIX) 75 MG tablet Take 1 tablet by mouth daily.  . famotidine (PEPCID) 20 MG tablet Take by mouth.  . ferrous sulfate 325 (65 FE) MG tablet Take 1 tablet (325 mg total) by mouth daily with breakfast.  . glucose blood test strip USE UP TO FOUR TIMES DAILY AS DIRECTED  . hydrALAZINE (APRESOLINE) 50 MG tablet Take 1 tablet (50 mg total) by mouth every 8 (eight) hours.  . insulin aspart (NOVOLOG) 100 UNIT/ML FlexPen Inject 8 Units into the skin 3 (three) times daily with meals.  . insulin detemir (LEVEMIR) 100 UNIT/ML FlexPen Inject 39 Units into the skin at bedtime.  . Insulin Pen Needle 31G X 6 MM MISC Use as directed with breakfast, with lunch, and with evening meal.  . Insulin Pen Needle 32G X 4 MM MISC USE AS DIRECTED WITH BREAKFAST, LUNCH, AND EVENING MEAL.  Marland Kitchen  metoprolol tartrate (LOPRESSOR) 100 MG tablet Take 1 tablet (100 mg total) by mouth 2 (two) times daily.  . Multiple Vitamin (MULTIVITAMIN WITH MINERALS) TABS tablet Take 1 tablet by mouth daily.  . potassium chloride (KLOR-CON) 10 MEQ tablet Take 1 tablet (10 mEq total) by mouth 2 (two) times daily.  Marland Kitchen torsemide (DEMADEX) 20 MG tablet Take 1 tablet (20 mg total) by mouth daily.  . TRUEplus Lancets 28G MISC USE UP TO FOUR TIMES DAILY AS DIRECTED  . [DISCONTINUED] amLODipine (NORVASC) 10 MG tablet Take 1 tablet (10 mg total) by mouth daily.  . [DISCONTINUED] torsemide (DEMADEX) 10 MG tablet Take 10 mg by mouth daily.     Review of Systems    All other systems reviewed and are otherwise negative except as noted above.  Physical Exam    VS:  BP 134/80 (BP Location: Right Arm, Patient Position: Sitting, Cuff Size: Large)   Pulse 86   Ht 5'  10" (1.778 m)   Wt (!) 453 lb (205.5 kg)   SpO2 96%   BMI 65.00 kg/m  , BMI Body mass index is 65 kg/m.  Wt Readings from Last 3 Encounters:  05/11/21 (!) 453 lb (205.5 kg)  05/05/21 (!) 437 lb (198.2 kg)  04/22/21 (!) 455 lb 12.8 oz (206.7 kg)    GEN: Well nourished, overweight, well developed, in no acute distress. HEENT: normal. Neck: Supple, no JVD, carotid bruits, or masses. Cardiac: RRR, no murmurs, rubs, or gallops. No clubbing, cyanosis. Non pitting bilateral LE edema. Radials/PT 2+ and equal bilaterally.  Respiratory:  Respirations regular and unlabored, clear to auscultation bilaterally. GI: Soft, nontender, nondistended. MS: No deformity or atrophy. Skin: Warm and dry, no rash. Neuro:  Strength and sensation are intact. Psych: Normal affect.  Assessment & Plan    1. Ventricular tachycardia in setting of COVID 19 - Treated with beta blocker during admission 01/2021. EKG today SR 86 bpm. Continue Metoprolol tartrate 166m BID. Denies palpitations.   2. Elevated troponin / NSTEMI in setting of COVID19 - LHC with no significant CAD. Recommended for DAPT Asa/Plavix for 12 months. Questionable myocarditis, unable to be confirmed by MRI due to BMI. LVEF during LHC 25%. Echo 01/20/21 with LVEF 638-93% normal diastolic parameters, no significant valvualr abnormalities. No recurrent anignal symptoms. Denies bleeding complications on DAPT. GDMT includes aspirin, plavix, beta blocker, atorvastatin. Heart healthy diet and regular cardiovascular exercise encouraged.   3. Morbid obesity - Weight loss via diet and exercise encouraged. Discussed the impact being overweight would have on cardiovascular risk.  4. HTN - BP well controlled.  The cuffs we have in stock in our clinic are too small for his arm and reach out to social work to obtain large cuff for him. Reduce Amlodipine to 55mdaily as high dose could be contributory to edema. If edema persists despite increased diuretic, as below,  consider discontinuation.  5. LE edema -etiology likely multifactorial anemia, sodium intake, fluid intake, obesity, venous insufficiency, high-dose amlodipine. Unable to exclude element of diastolic dysfunction.  Increase torsemide to 30 mg daily for 3 days then reduce to torsemide 20 mg daily.  Continue potassium 10 mEq twice daily.   BMP, CBC, BNP today. Repeat BMP 1 week.  Elevation, compression, low salt diet encouraged.   6. Anemia - Continue to follow with PCP. Likely contributory to fatigue, edema. CBC today.   7. AKI - Noted during recent admission. Continue to follow with nephrology. BMP today for monitoring.  8. DM2 - Continue  to follow with primary care provider. If additional agent needed, consider SGLT2i for cardioprotective benefit.   9. HLD, LDL goal <70- Continue Atorvastatin 61m daily.    Disposition: Follow up in July as scheduled with Cadence FKathlen Mody PA  Signed, CLoel Dubonnet NP 05/11/2021, 9:59 AM [place]

## 2021-05-11 NOTE — Telephone Encounter (Signed)
CSW consulted to help pt obtain large blood pressure cuff.  CSW able to order cuff- anticipate delivery Thursday, June 2nd.  Burna Sis, LCSW Clinical Social Worker [facility] Desk#: [phone] Cell#: [phone]

## 2021-05-11 NOTE — Telephone Encounter (Signed)
Medication Refill - Medication: glucose blood test strip famotidine (PEPCID) 20 MG tablet     Preferred Pharmacy (with phone number or street name): [facility]  Agent: Please be advised that RX refills may take up to 3 business days. We ask that you follow-up with your pharmacy.

## 2021-05-12 ENCOUNTER — Other Ambulatory Visit: Payer: Self-pay

## 2021-05-12 ENCOUNTER — Other Ambulatory Visit: Payer: Self-pay | Admitting: Physician Assistant

## 2021-05-12 NOTE — Telephone Encounter (Signed)
Requested medication (s) are due for refill today: Yes  Requested medication (s) are on the active medication list: Yes  Last refill:  02/26/21  Future visit scheduled: Yes  Notes to clinic:  See highlighted from pharmacy     Requested Prescriptions  Pending Prescriptions Disp Refills   glucose blood (TRUE METRIX BLOOD GLUCOSE TEST) test strip 100 strip 0    Sig: USE UP TO FOUR TIMES DAILY AS DIRECTED      Endocrinology: Diabetes - Testing Supplies Passed - 05/12/2021  4:26 PM      Passed - Valid encounter within last 12 months    Recent Outpatient Visits           2 weeks ago Diabetes mellitus, new onset [facility])   [facility] [place], Marzella Schlein, [place]       Future Appointments             In 3 weeks Hoy Register, MD [facility]   In 1 month Furth, Cadence H, PA-C [facility], [facility]

## 2021-05-13 ENCOUNTER — Other Ambulatory Visit: Payer: Self-pay

## 2021-05-13 ENCOUNTER — Other Ambulatory Visit (HOSPITAL_COMMUNITY): Payer: Self-pay

## 2021-05-13 MED ORDER — TRUE METRIX BLOOD GLUCOSE TEST VI STRP
ORAL_STRIP | 0 refills | Status: DC
  Filled 2021-05-13: qty 100, 25d supply, fill #0

## 2021-05-13 MED ORDER — GLUCOSE BLOOD VI STRP
ORAL_STRIP | 0 refills | Status: DC
Start: 2021-05-13 — End: 2021-05-13
  Filled 2021-05-13: qty 100, fill #0

## 2021-05-14 ENCOUNTER — Other Ambulatory Visit: Payer: Self-pay

## 2021-05-19 ENCOUNTER — Other Ambulatory Visit: Payer: Self-pay

## 2021-06-08 ENCOUNTER — Ambulatory Visit: Payer: Self-pay | Attending: Family Medicine | Admitting: Family Medicine

## 2021-06-08 ENCOUNTER — Other Ambulatory Visit: Payer: Self-pay

## 2021-06-08 ENCOUNTER — Encounter: Payer: Self-pay | Admitting: Family Medicine

## 2021-06-08 DIAGNOSIS — Z794 Long term (current) use of insulin: Secondary | ICD-10-CM

## 2021-06-08 DIAGNOSIS — L97519 Non-pressure chronic ulcer of other part of right foot with unspecified severity: Secondary | ICD-10-CM

## 2021-06-08 DIAGNOSIS — L97509 Non-pressure chronic ulcer of other part of unspecified foot with unspecified severity: Secondary | ICD-10-CM

## 2021-06-08 DIAGNOSIS — I429 Cardiomyopathy, unspecified: Secondary | ICD-10-CM

## 2021-06-08 DIAGNOSIS — E11621 Type 2 diabetes mellitus with foot ulcer: Secondary | ICD-10-CM

## 2021-06-08 DIAGNOSIS — I10 Essential (primary) hypertension: Secondary | ICD-10-CM

## 2021-06-08 NOTE — Progress Notes (Signed)
Virtual Visit via Telephone Note  I connected with Erik [name], on 06/08/2021 at 2:39 PM by telephone due to the COVID-19 pandemic and verified that I am speaking with the correct person using two identifiers.   Consent: I discussed the limitations, risks, security and privacy concerns of performing an evaluation and management service by telephone and the availability of in person appointments. I also discussed with the patient that there may be a patient responsible charge related to this service. The patient expressed understanding and agreed to proceed.   Location of Patient: Home  Location of Provider: Clinic   Persons participating in Telemedicine visit: ELADIO DENTREMONT Dr. Margarita Rana     History of Present Illness: 30 year old male with a history of type 2 diabetes mellitus (A1c 6.6), stage II CKD, history of COVID with cardiomyopathy who presents today to establish care. He had a hospitalization in 01/2021 for acute respiratory failure , pneumonia secondary to COVID-19, cardiomyopathy, newly diagnosed type 2 diabetes mellitus with an A1c of 13.6, acute kidney injury after which he was discharged to rehab.  Subsequently readmitted in 02/2021 for acute respiratory failure secondary to pneumonia. He has since had a hospital follow-up visit with the physician assistant last month and also saw his nephrologist last week for follow-up.  His last creatinine was 1.34 last month.  Endorses compliance with his medications. BP at nephrology visit was 133/92 Fasting sugars are around 120 and he denies hypoglycemic episodes.  Denies presence of visual symptoms, numbness in extremities and is not up-to-date on annual eye exam. He does have a diabetic ulcer which is followed by wound care and is healing Pedal edema has improved.  Denies presence of chest pain, dyspnea, paroxysmal nocturnal dyspnea Does not exercise much as he was advised he needed to be off his feet.   Past Medical History:   Diagnosis Date   Asthma    Diabetes mellitus without complication ([place])    No Known Allergies  Current Outpatient Medications on File Prior to Visit  Medication Sig Dispense Refill   albuterol (VENTOLIN HFA) 108 (90 Base) MCG/ACT inhaler INHALE 1-2 PUFFS INTO THE LUNGS EVERY SIX HOURS AS NEEDED FOR WHEEZING OR SHORTNESS OF BREATH. 18 g 0   amLODipine (NORVASC) 5 MG tablet Take 1 tablet (5 mg total) by mouth daily. 30 tablet 2   aspirin 81 MG chewable tablet Chew 1 tablet (81 mg total) by mouth daily. 100 tablet 0   atorvastatin (LIPITOR) 80 MG tablet Take 1 tablet (80 mg total) by mouth daily. 30 tablet 2   blood glucose meter kit and supplies KIT Dispense based on patient and insurance preference. Use up to four times daily as directed. 1 each 0   Blood Glucose Monitoring Suppl (TRUE METRIX METER) w/Device KIT USE UP TO FOUR TIMES DAILY AS DIRECTED. 1 kit 0   clopidogrel (PLAVIX) 75 MG tablet Take 1 tablet by mouth daily.     famotidine (PEPCID) 20 MG tablet TAKE 1 TABLET (20 MG TOTAL) BY MOUTH DAILY. 30 tablet 1   famotidine (PEPCID) 20 MG tablet Take by mouth.     ferrous sulfate 325 (65 FE) MG tablet Take 1 tablet (325 mg total) by mouth daily with breakfast. 90 tablet 3   glucose blood (TRUE METRIX BLOOD GLUCOSE TEST) test strip USE UP TO FOUR TIMES DAILY AS DIRECTED 100 strip 0   hydrALAZINE (APRESOLINE) 50 MG tablet Take 1 tablet (50 mg total) by mouth every 8 (eight) hours. 90 tablet  2   insulin aspart (NOVOLOG) 100 UNIT/ML FlexPen Inject 8 Units into the skin 3 (three) times daily with meals. 15 mL 3   insulin detemir (LEVEMIR) 100 UNIT/ML FlexPen Inject 39 Units into the skin at bedtime. 15 mL 3   Insulin Pen Needle 31G X 6 MM MISC Use as directed with breakfast, with lunch, and with evening meal. 120 each 2   Insulin Pen Needle 32G X 4 MM MISC USE AS DIRECTED WITH BREAKFAST, LUNCH, AND EVENING MEAL. 100 each 0   metoprolol tartrate (LOPRESSOR) 100 MG tablet Take 1 tablet (100 mg  total) by mouth 2 (two) times daily. 60 tablet 2   Multiple Vitamin (MULTIVITAMIN WITH MINERALS) TABS tablet Take 1 tablet by mouth daily.     potassium chloride (KLOR-CON) 10 MEQ tablet Take 1 tablet (10 mEq total) by mouth 2 (two) times daily. 30 tablet 3   torsemide (DEMADEX) 20 MG tablet Take 1 tablet (20 mg total) by mouth daily. 90 tablet 1   TRUEplus Lancets 28G MISC USE UP TO FOUR TIMES DAILY AS DIRECTED 100 each 2   No current facility-administered medications on file prior to visit.    ROS: See HPI  Observations/Objective: Awake, alert, oriented x3 Not in acute distress Normal mood  CMP Latest Ref Rng & Units 05/11/2021 04/22/2021 03/10/2021  Glucose 70 - 99 mg/dL 99 119(H) 131(H)  BUN 6 - 20 mg/dL _0 Creatinine 0.61 - 1.24 mg/dL 1.34(H) 1.42(H) 1.45(H)  Sodium 135 - 145 mmol/L 141 145(H) 140  Potassium 3.5 - 5.1 mmol/L 4.0 4.1 3.6  Chloride 98 - 111 mmol/L 107 108(H) 111  CO2 22 - 32 mmol/L _1 Calcium 8.9 - 10.3 mg/dL 8.9 8.9 8.9  Total Protein 6.0 - 8.5 g/dL - 6.2 -  Total Bilirubin 0.0 - 1.2 mg/dL - 0.3 -  Alkaline Phos 44 - 121 IU/L - 124(H) -  AST 0 - 40 IU/L - 10 -  ALT 0 - 44 IU/L - 12 -     Lab Results  Component Value Date   HGBA1C 6.6 04/22/2021    Assessment and Plan: 1. Type 2 diabetes mellitus with foot ulcer, with long-term current use of insulin (HCC) Controlled with A1c of 6.6 which is down from 13.6 previously; goal is less than 7.0 He denies hypoglycemic episodes Continue current regimen Counseled on community resources for eye exams given he has no medical coverage and this precludes referral to ophthalmology Counseled on Diabetic diet, my plate method, 101 minutes of moderate intensity exercise/week Blood sugar logs with fasting goals of 80-120 mg/dl, random of less than 180 and in the event of sugars less than 60 mg/dl or greater than 400 mg/dl encouraged to notify the clinic. Advised on the need for annual eye exams, annual  foot exams, Pneumonia vaccine.  2. Ulcer of right foot, unspecified ulcer stage ([place]) Improving Followed by wound care Advised to use compression stocking especially in the light of his pedal edema  3. Cardiomyopathy, unspecified type ([place]) In the setting of previous COVID-19 infection EF of 60 to 65% Euvolemic Asymptomatic  4. Essential hypertension Controlled Continue antihypertensives Counseled on blood pressure goal of less than 130/80, low-sodium, DASH diet, medication compliance, 150 minutes of moderate intensity exercise per week. Discussed medication compliance, adverse effects.    Follow Up Instructions: 3 months   I discussed the assessment and treatment plan with the patient. The patient was provided an opportunity to ask questions and all  were answered. The patient agreed with the plan and demonstrated an understanding of the instructions.   The patient was advised to call back or seek an in-person evaluation if the symptoms worsen or if the condition fails to improve as anticipated.     I provided 11 minutes total of non-face-to-face time during this encounter.   Charlott Rakes, MD, FAAFP. [facility], [place]   06/08/2021, 2:39 PM

## 2021-06-11 ENCOUNTER — Other Ambulatory Visit: Payer: Self-pay | Admitting: Registered Nurse

## 2021-06-11 ENCOUNTER — Other Ambulatory Visit: Payer: Self-pay

## 2021-06-11 ENCOUNTER — Other Ambulatory Visit: Payer: Self-pay | Admitting: Family Medicine

## 2021-06-11 MED ORDER — TRUE METRIX BLOOD GLUCOSE TEST VI STRP
ORAL_STRIP | 0 refills | Status: DC
Start: 2021-06-11 — End: 2021-07-14
  Filled 2021-06-11: qty 100, 25d supply, fill #0

## 2021-06-15 ENCOUNTER — Ambulatory Visit (INDEPENDENT_AMBULATORY_CARE_PROVIDER_SITE_OTHER): Payer: Self-pay | Admitting: Medical

## 2021-06-15 ENCOUNTER — Other Ambulatory Visit: Payer: Self-pay

## 2021-06-15 ENCOUNTER — Other Ambulatory Visit: Payer: Self-pay | Admitting: *Deleted

## 2021-06-15 ENCOUNTER — Encounter: Payer: Self-pay | Admitting: Medical

## 2021-06-15 ENCOUNTER — Other Ambulatory Visit
Admission: RE | Admit: 2021-06-15 | Discharge: 2021-06-15 | Disposition: A | Discharge status: Home / Self Care | Payer: Self-pay | Attending: Medical | Admitting: Medical

## 2021-06-15 VITALS — BP 130/82 | HR 99 | Ht 70.0 in | Wt >= 6400 oz

## 2021-06-15 DIAGNOSIS — E11621 Type 2 diabetes mellitus with foot ulcer: Secondary | ICD-10-CM

## 2021-06-15 DIAGNOSIS — I10 Essential (primary) hypertension: Secondary | ICD-10-CM

## 2021-06-15 DIAGNOSIS — I472 Ventricular tachycardia, unspecified: Secondary | ICD-10-CM

## 2021-06-15 DIAGNOSIS — R600 Localized edema: Secondary | ICD-10-CM

## 2021-06-15 DIAGNOSIS — I5032 Chronic diastolic (congestive) heart failure: Secondary | ICD-10-CM | POA: Insufficient documentation

## 2021-06-15 DIAGNOSIS — E782 Mixed hyperlipidemia: Secondary | ICD-10-CM | POA: Insufficient documentation

## 2021-06-15 DIAGNOSIS — N179 Acute kidney failure, unspecified: Secondary | ICD-10-CM

## 2021-06-15 DIAGNOSIS — L97509 Non-pressure chronic ulcer of other part of unspecified foot with unspecified severity: Secondary | ICD-10-CM

## 2021-06-15 DIAGNOSIS — E119 Type 2 diabetes mellitus without complications: Secondary | ICD-10-CM | POA: Insufficient documentation

## 2021-06-15 DIAGNOSIS — I252 Old myocardial infarction: Secondary | ICD-10-CM

## 2021-06-15 DIAGNOSIS — I213 ST elevation (STEMI) myocardial infarction of unspecified site: Secondary | ICD-10-CM | POA: Insufficient documentation

## 2021-06-15 LAB — BASIC METABOLIC PANEL
Anion gap: 6 (ref 5–15)
BUN: 22 mg/dL — ABNORMAL HIGH (ref 6–20)
CO2: 27 mmol/L (ref 22–32)
Calcium: 9 mg/dL (ref 8.9–10.3)
Chloride: 108 mmol/L (ref 98–111)
Creatinine, Ser: 1.48 mg/dL — ABNORMAL HIGH (ref 0.61–1.24)
GFR, Estimated: >60 mL/min (ref >60)
Glucose, Bld: 120 mg/dL — ABNORMAL HIGH (ref 70–99)
Potassium: 4.1 mmol/L (ref 3.5–5.1)
Sodium: 141 mmol/L (ref 135–145)

## 2021-06-15 MED ORDER — CARVEDILOL 25 MG PO TABS
25.0000 mg | ORAL_TABLET | Freq: Two times a day (BID) | ORAL | 3 refills | Status: DC
  Filled 2021-06-15: qty 60, 30d supply, fill #0
  Filled 2021-07-14: qty 60, 30d supply, fill #1
  Filled 2021-09-03: qty 60, 30d supply, fill #2
  Filled 2021-09-27: qty 60, 30d supply, fill #3
  Filled 2021-10-21: qty 60, 30d supply, fill #4
  Filled 2022-02-10: qty 60, 30d supply, fill #5
  Filled 2022-02-10: qty 60, 30d supply, fill #0
  Filled 2022-04-28: qty 60, 30d supply, fill #1

## 2021-06-15 NOTE — Patient Instructions (Addendum)
Medication Instructions:  Your physician has recommended you make the following change in your medication:  STOP Amlodipine  STOP Metoprolol  START Carevedilol 25 mg taking 1 tablet twice a day     *If you need a refill on your cardiac medications before your next appointment, please call your pharmacy*   Lab Work: TODAY:  BMET   If you have labs (blood work) drawn today and your tests are completely normal, you will receive your results only by: MyChart Message (if you have MyChart) OR A paper copy in the mail If you have any lab test that is abnormal or we need to change your treatment, we will call you to review the results.   Testing/Procedures: None ordered   Follow-Up: At [facility], you and your health needs are our priority.  As part of our continuing mission to provide you with exceptional heart care, we have created designated Provider Care Teams.  These Care Teams include your primary Cardiologist (physician) and Advanced Practice Providers (APPs -  Physician Assistants and Nurse Practitioners) who all work together to provide you with the care you need, when you need it.  We recommend signing up for the patient portal called "MyChart".  Sign up information is provided on this After Visit Summary.  MyChart is used to connect with patients for Virtual Visits (Telemedicine).  Patients are able to view lab/test results, encounter notes, upcoming appointments, etc.  Non-urgent messages can be sent to your provider as well.   To learn more about what you can do with MyChart, go to [email or web address].    Your next appointment:   2 month(s)  The format for your next appointment:   In Person  Provider:   You may see Yvonne Kendall, MD or one of the following Advanced Practice Providers on your designated Care Team:   Nicolasa Ducking, NP Eula Listen, PA-C Marisue Ivan, PA-C Cadence [place], [place] Gillian Shields, NP   Other Instructions

## 2021-06-15 NOTE — Progress Notes (Signed)
Cardiology Office Note:    Date:  06/15/2021   ID:  Erik [name], DOB 02/03/1991, MRN [id number]  PCP:  Erik Madura, PA-C  [facility] Cardiologist:  Erik Bush, MD  [facility] Electrophysiologist:  None   Referring MD: No ref. provider found   Chief Complaint: 2 month follow-up  History of Present Illness:    Erik [name] is a 30 y.o. male with a hx of asthma, morbid obesity, DM2, COVID 2 requiring hospitalization with cardiomyopathy, morbid obesity, ventricuar tachycardia, HLD, ARDS and AKI, anemia who presents for follow-up.   Patient was admitted 01/12/21 for COVID 19 PNA and ARDS with severe acidosis and severe hypoxia with acute renal failure requiring intubation and CRRT. Treated with Remdesivir and steroids. On 01/19/21 he had a STEMI with cardiac cath 01/20/21 with no significant coronary artery disease. Myocarditis was suspected. DAPT for 1 year was recommended. His EF on cath was 25% and by subsequent echo had normalized. He was diagnosed with diabetes. He was discharged to IP rehab where he stayed 02/03/21-02/26/21 and then was discharged home.   Hospitalized 03/07/21-03/10/21 due to worsening shortness of breath with acute hypoxic respiratory failure secondary to healthcare associated PNA as well as sepsis. He was treated with IV abx. He was also noted to have symptomatic anemia with no evidence of bleeding and required 1 unit PRBCs  Seen by nephrology 04/12/21 and started on Torsemide 58m daily due to elevated BP and lower extremity edema.   Seen in follow-up 5/5 with improved dyspnea and LE. Say PCP 5/12 and had 13lbs weight gain and Torsemide was increased to 251mdaily for 5 days.    Seen 5//31/22 and was still volume overloaded. Torsemide was increased to 3023mor 3 days and then back down to 18m67mily. Amlodipine was decreased.   Today, the patient reports he has been taking torsemide 20 mg daily. With down from the last visit, 453>427lbs. He feels better overall.  Has been drinking more than he should. Has not been managing low salt diet that well. Going out to eat 3-4 times a week. Does no formal activity due to diabetic ulcer on the foot. Denies chest pain or shortness of breath, palpitations, lightheaded ness or dizziness. Did not go get second set of labs. They felt reducing the amlodipine greatly improved the swelling. BP good and heart rate 99bpm.   Past Medical History:  Diagnosis Date   Asthma    Diabetes mellitus without complication (HCC)[place]  Past Surgical History:  Procedure Laterality Date   CARDIAC CATHETERIZATION     LEFT HEART CATH AND CORONARY ANGIOGRAPHY N/A 01/20/2021   Procedure: LEFT HEART CATH AND CORONARY ANGIOGRAPHY;  Surgeon: End,Erik [name];  [facility];  Service: Cardiovascular;  Laterality: N/A;    Current Medications: Current Meds  Medication Sig   albuterol (VENTOLIN HFA) 108 (90 Base) MCG/ACT inhaler INHALE 1-2 PUFFS INTO THE LUNGS EVERY SIX HOURS AS NEEDED FOR WHEEZING OR SHORTNESS OF BREATH.   aspirin 81 MG chewable tablet Chew 1 tablet (81 mg total) by mouth daily.   atorvastatin (LIPITOR) 80 MG tablet Take 1 tablet (80 mg total) by mouth daily.   blood glucose meter kit and supplies KIT Dispense based on patient and insurance preference. Use up to four times daily as directed.   Blood Glucose Monitoring Suppl (TRUE METRIX METER) w/Device KIT USE UP TO FOUR TIMES DAILY AS DIRECTED.   carvedilol (COREG) 25 MG tablet Take 1  tablet (25 mg total) by mouth 2 (two) times daily.   clopidogrel (PLAVIX) 75 MG tablet Take 1 tablet by mouth daily.   famotidine (PEPCID) 20 MG tablet TAKE 1 TABLET (20 MG TOTAL) BY MOUTH DAILY.   ferrous sulfate 325 (65 FE) MG tablet Take 1 tablet (325 mg total) by mouth daily with breakfast.   glucose blood (TRUE METRIX BLOOD GLUCOSE TEST) test strip USE UP TO FOUR TIMES DAILY AS DIRECTED   hydrALAZINE (APRESOLINE) 50 MG tablet Take 1 tablet (50 mg total) by mouth every 8  (eight) hours.   insulin aspart (NOVOLOG) 100 UNIT/ML FlexPen Inject 8 Units into the skin 3 (three) times daily with meals.   insulin detemir (LEVEMIR) 100 UNIT/ML FlexPen Inject 39 Units into the skin at bedtime.   Insulin Pen Needle 31G X 6 MM MISC Use as directed with breakfast, with lunch, and with evening meal.   Insulin Pen Needle 32G X 4 MM MISC USE AS DIRECTED WITH BREAKFAST, LUNCH, AND EVENING MEAL.   Multiple Vitamin (MULTIVITAMIN WITH MINERALS) TABS tablet Take 1 tablet by mouth daily.   potassium chloride (KLOR-CON) 10 MEQ tablet Take 1 tablet (10 mEq total) by mouth 2 (two) times daily.   torsemide (DEMADEX) 20 MG tablet Take 1 tablet (20 mg total) by mouth daily.   TRUEplus Lancets 28G MISC USE UP TO FOUR TIMES DAILY AS DIRECTED   [DISCONTINUED] amLODipine (NORVASC) 5 MG tablet Take 1 tablet (5 mg total) by mouth daily.   [DISCONTINUED] metoprolol tartrate (LOPRESSOR) 100 MG tablet Take 1 tablet (100 mg total) by mouth 2 (two) times daily.     Allergies:   Patient has no known allergies.   Social History   Socioeconomic History   Marital status: Single    Spouse name: Not on file   Number of children: Not on file   Years of education: Not on file   Highest education level: Not on file  Occupational History   Not on file  Tobacco Use   Smoking status: Never   Smokeless tobacco: Never  Vaping Use   Vaping Use: Never used  Substance and Sexual Activity   Alcohol use: Never   Drug use: Never   Sexual activity: Never  Other Topics Concern   Not on file  Social History Narrative   Not on file   Social Determinants of Health   Financial Resource Strain: Not on file  Food Insecurity: Not on file  Transportation Needs: Not on file  Physical Activity: Not on file  Stress: Not on file  Social Connections: Not on file     Family History: The patient's family history includes Heart Problems in his maternal grandmother; Heart attack in his maternal grandmother;  Heart disease in his maternal grandmother; Hypertension in his mother; Kidney disease in his mother.  ROS:   Please see the history of present illness.     All other systems reviewed and are negative.  EKGs/Labs/Other Studies Reviewed:    The following studies were reviewed today:  Echo 01/20/21  1. Left ventricular ejection fraction, by estimation, is 60 to 65%. The  left ventricle has normal function. The left ventricle has no regional  wall motion abnormalities. Left ventricular diastolic parameters were  normal.   2. Right ventricular systolic function is normal. The right ventricular  size is normal.   3. The mitral valve is normal in structure. No evidence of mitral valve  regurgitation. No evidence of mitral stenosis.   4. The  aortic valve is normal in structure. Aortic valve regurgitation is  not visualized. No aortic stenosis is present.   5. The inferior vena cava is normal in size with greater than 50%  respiratory variability, suggesting right atrial pressure of 3 mmHg.    Cardiac cath 01/20/21 Conclusions: Minimal coronary artery plaquing without angiographically significant coronary artery disease. Moderately reduced left ventricular contraction with global hypokinesis. Mildly elevated left ventricular filling pressure (LVEDP ~20 mmHg).   Recommendations: No obvious coronary artery lesion to explain ST segment changes and sustained ventricular tachycardia.  Possible causes include metabolic/electrolyte derangements and myocarditis in the setting of COVID-19. Trend HS-TnI until it has peaked, then stop. Obtain transthoracic echocardiogram. If blood pressure tolerates, consider addition of low-dose beta-blocker. If patient has recurrent VT, consider IV amiodarone. Replete electrolytes to maintain K > 4.0 and Mg > 2.0.   Erik Bush, MD [facility]    EKG:  EKG is  ordered today.  The ekg ordered today demonstrates SR, 99bpm,iRBBB, QRS 96m, no significant  change.   Recent Labs: 03/10/2021: Magnesium 2.0 04/22/2021: ALT 12 05/11/2021: B Natriuretic Peptide 245.5; Hemoglobin 8.0; Platelets 344 06/15/2021: BUN 22; Creatinine, Ser 1.48; Potassium 4.1; Sodium 141  Recent Lipid Panel    Component Value Date/Time   CHOL 165 01/27/2021 1105   TRIG 230 (H) 01/27/2021 1105   HDL 54 01/27/2021 1105   CHOLHDL 3.1 01/27/2021 1105   VLDL 46 (H) 01/27/2021 1105   LDLCALC 65 01/27/2021 1105      Physical Exam:    VS:  BP 130/82 (BP Location: Left Arm, Patient Position: Sitting, Cuff Size: Large)   Pulse 99   Ht '5\' 10"'  (1.778 m)   Wt (!) 427 lb (193.7 kg)   SpO2 96%   BMI 61.27 kg/m     Wt Readings from Last 3 Encounters:  06/15/21 (!) 427 lb (193.7 kg)  05/11/21 (!) 453 lb (205.5 kg)  05/05/21 (!) 437 lb (198.2 kg)     GEN:  Well nourished, well developed in no acute distress HEENT: Normal NECK: No JVD; No carotid bruits LYMPHATICS: No lymphadenopathy CARDIAC: RRR, no murmurs, rubs, gallops RESPIRATORY:  Clear to auscultation without rales, wheezing or rhonchi  ABDOMEN: Soft, non-tender, non-distended MUSCULOSKELETAL:  1+ lower leg edema, L>R; No deformity  SKIN: Warm and dry NEUROLOGIC:  Alert and oriented x 3 PSYCHIATRIC:  Normal affect   ASSESSMENT:    1. Lower leg edema   2. Essential hypertension   3. Mixed hyperlipidemia   4. History of non-ST elevation myocardial infarction (NSTEMI)   5. VT (ventricular tachycardia) ([place]   6. Morbid obesity ([place]   7. Type 2 diabetes mellitus with foot ulcer, without long-term current use of insulin ([facility]   8. Chronic diastolic heart failure ([place]   9. AKI (acute kidney injury) ([facility]    PLAN:    In order of problems listed above:  VT in the setting of COVID 19 No known recurrence. EKG with SR, 99bpm and no significant change. Continue BB.  Elevated troponin/NSTEMI LHC with no significant CAD, recommended ASA and Plavix for 12 months. Questionable myocarditis unable to be  confirmed to due BMI. LHC showed EF 35% however echo showed normal LVEF. Patient denies anginal symptoms. Continue aspirin, BB, statin, plavix. Lifestyle changes discussed. CBC last visit with low but stable Hgb.   Morbid obesity Recommended activity as tolerated. He has diabetic foot ulcer limiting function.   LLE/?HFrEF with recovered EF Weight has been trending down since the  last visit after increase of Torsemide. He is taking torsemide 60m daily. Did not get follow-up BMET, so will check today. Still has 1+ lower leg edema on exam. Breathing is good. Reports dietary indiscretion and increased fluid intake. Overall, patient seems to be improving. I will stop amlodipine given persistent lower leg swelling. CHF education discussed in detail. Continue current Torsemide dose. Continue BB and hydralazine. No ACE/ARB with CKD. Continue daily weights, compression socks, low salt diet, fluid restriction, and leg elevation. We will see him back in 2 months to evaluate volume status.  HTN I will stop amlodipine 576m Switch metoprolol to coreg 2554mID for BP control. If BP at follow-up is above goal can increase hydralazine.   Anemia Hgb stable at 8 05/11/21. Followed by PCP.  H/o AKI Kidney function improving. BMET today as above. Following with nephrology.   HLD LDL 65. Continue Atorvastatin 52m16mily.   Disposition: Follow up in 2 month(s) with APP/MD    Signed, Arnell Mausolf H FuNinfa Meeker-C  06/15/2021 3:56 PM    [facility]

## 2021-06-16 ENCOUNTER — Telehealth: Payer: Self-pay | Admitting: Medical

## 2021-06-16 ENCOUNTER — Other Ambulatory Visit: Payer: Self-pay

## 2021-06-16 ENCOUNTER — Other Ambulatory Visit: Payer: Self-pay | Admitting: Pharmacist

## 2021-06-16 MED ORDER — CLOPIDOGREL BISULFATE 75 MG PO TABS
75.0000 mg | ORAL_TABLET | Freq: Every day | ORAL | 3 refills | Status: DC
  Filled 2021-06-16 – 2021-07-14 (×2): qty 90, 90d supply, fill #0
  Filled 2021-09-27: qty 30, 30d supply, fill #1
  Filled 2021-10-21: qty 30, 30d supply, fill #2
  Filled 2022-01-17: qty 30, 30d supply, fill #0
  Filled 2022-01-17: qty 30, 30d supply, fill #3
  Filled 2022-02-11: qty 30, 30d supply, fill #1
  Filled 2022-03-22: qty 30, 30d supply, fill #2
  Filled 2022-04-28: qty 30, 30d supply, fill #3

## 2021-06-16 MED ORDER — CLOPIDOGREL BISULFATE 75 MG PO TABS
75.0000 mg | ORAL_TABLET | Freq: Every day | ORAL | 0 refills | Status: DC
  Filled 2021-06-16: qty 30, 30d supply, fill #0

## 2021-06-16 NOTE — Telephone Encounter (Signed)
*  STAT* If patient is at the pharmacy, call can be transferred to refill team.   1. Which medications need to be refilled? (please list name of each medication and dose if known) clopidogrel   2. Which pharmacy/location (including street and city if local pharmacy) is medication to be sent to? [facility] [place]  3. Do they need a 30 day or 90 day supply? 90  Patient went to pick up RX and the prescription was not there. Pharmacy gave patient 30 days with approval but for any future refills they will need a new RX. Please advise patient if he is not supposed to be taking medication

## 2021-06-16 NOTE — Telephone Encounter (Signed)
Spoke with patients mother per release form. She inquired about patients plavix and wanted a 30 day supply. She did not know if this was going to be long term medication or not. Reviewed chart and per notes this medication will need to be taken for one year. She then recalled that conversation and then requested 90 day supply to be done. Sent that into pharmacy and she was appreciative for the call back with no further questions.

## 2021-06-16 NOTE — Telephone Encounter (Signed)
Patient's mother wants to change to 30 day supply

## 2021-06-28 ENCOUNTER — Other Ambulatory Visit: Payer: Self-pay

## 2021-06-28 ENCOUNTER — Other Ambulatory Visit: Payer: Self-pay | Admitting: Family Medicine

## 2021-06-30 MED ORDER — ALBUTEROL SULFATE HFA 108 (90 BASE) MCG/ACT IN AERS
INHALATION_SPRAY | RESPIRATORY_TRACT | 0 refills | Status: DC
  Filled 2021-06-30: qty 18, 25d supply, fill #0

## 2021-07-01 ENCOUNTER — Other Ambulatory Visit: Payer: Self-pay

## 2021-07-08 ENCOUNTER — Other Ambulatory Visit: Payer: Self-pay

## 2021-07-14 ENCOUNTER — Other Ambulatory Visit: Payer: Self-pay

## 2021-07-14 ENCOUNTER — Other Ambulatory Visit: Payer: Self-pay | Admitting: Physician Assistant

## 2021-07-14 ENCOUNTER — Other Ambulatory Visit: Payer: Self-pay | Admitting: Family Medicine

## 2021-07-14 DIAGNOSIS — D500 Iron deficiency anemia secondary to blood loss (chronic): Secondary | ICD-10-CM

## 2021-07-14 MED ORDER — TRUE METRIX BLOOD GLUCOSE TEST VI STRP
ORAL_STRIP | 0 refills | Status: DC
  Filled 2021-07-14: qty 100, 25d supply, fill #0

## 2021-07-14 MED ORDER — FAMOTIDINE 20 MG PO TABS
ORAL_TABLET | Freq: Every day | ORAL | 1 refills | Status: DC
  Filled 2021-07-14: qty 30, 30d supply, fill #0
  Filled 2021-08-25: qty 30, 30d supply, fill #1

## 2021-07-16 ENCOUNTER — Other Ambulatory Visit: Payer: Self-pay

## 2021-08-04 ENCOUNTER — Other Ambulatory Visit: Payer: Self-pay

## 2021-08-06 ENCOUNTER — Other Ambulatory Visit: Payer: Self-pay

## 2021-08-20 ENCOUNTER — Encounter: Payer: Self-pay | Admitting: Internal Medicine

## 2021-08-20 ENCOUNTER — Other Ambulatory Visit: Payer: Self-pay

## 2021-08-20 ENCOUNTER — Ambulatory Visit (INDEPENDENT_AMBULATORY_CARE_PROVIDER_SITE_OTHER): Payer: Self-pay | Admitting: Internal Medicine

## 2021-08-20 VITALS — BP 134/82 | HR 93 | Ht 70.0 in | Wt >= 6400 oz

## 2021-08-20 DIAGNOSIS — I472 Ventricular tachycardia, unspecified: Secondary | ICD-10-CM

## 2021-08-20 DIAGNOSIS — R600 Localized edema: Secondary | ICD-10-CM

## 2021-08-20 DIAGNOSIS — I252 Old myocardial infarction: Secondary | ICD-10-CM

## 2021-08-20 DIAGNOSIS — I5033 Acute on chronic diastolic (congestive) heart failure: Secondary | ICD-10-CM

## 2021-08-20 DIAGNOSIS — I10 Essential (primary) hypertension: Secondary | ICD-10-CM

## 2021-08-20 DIAGNOSIS — N189 Chronic kidney disease, unspecified: Secondary | ICD-10-CM

## 2021-08-20 MED ORDER — TORSEMIDE 20 MG PO TABS
40.0000 mg | ORAL_TABLET | Freq: Every day | ORAL | 3 refills | Status: DC
  Filled 2021-08-20: qty 60, 30d supply, fill #0
  Filled 2021-09-27: qty 60, 30d supply, fill #1
  Filled 2021-10-22: qty 60, 30d supply, fill #2
  Filled 2021-11-29: qty 60, 30d supply, fill #3

## 2021-08-20 NOTE — Progress Notes (Signed)
Follow-up Outpatient Visit Date: 08/20/2021  Primary Care Provider: Kathlen Mody, Cadence H, PA-C [address] [place] [address]  Chief Complaint: Leg swelling and shortness of breath  HPI:  Erik [name] is a 30 y.o. male with history of nonischemic cardiomyopathy (LVEF initially 35-45% by left ventriculogram, 60-65% on subsequent echocardiogram) complicated by ventricular tachycardia in the setting of COVID-19 with ARDS and acute kidney injury, type 2 diabetes mellitus, and morbid obesity, who presents for follow-up of myopathy and ventricular tachycardia.  He was last seen in our office in early July by Tarri Glenn, [place], at which time he was feeling well.  He was noted to still have 1+ pretibial edema, prompting discontinuation of amlodipine.  Metoprolol was also stopped in favor of carvedilol.  Torsemide was continued.  Today, [name] reports that he has been putting on weight again.  He is using knee high compression stockings but still has quite a bit of swelling in his legs as well as his abdomen.  He feels like dyspnea on exertion is also a little worse compared with 06/2021.  He denies chest pain, palpitations, and lightheadedness.  He is currently following with podiatry for management of a diabetic foot ulcer.  --------------------------------------------------------------------------------------------------  Past Medical History:  Diagnosis Date   Asthma    Diabetes mellitus without complication ([place])    Past Surgical History:  Procedure Laterality Date   CARDIAC CATHETERIZATION     LEFT HEART CATH AND CORONARY ANGIOGRAPHY N/A 01/20/2021   Procedure: LEFT HEART CATH AND CORONARY ANGIOGRAPHY;  Surgeon: Nelva Bush, MD;  Location: [facility];  Service: Cardiovascular;  Laterality: N/A;     Current Meds  Medication Sig   albuterol (VENTOLIN HFA) 108 (90 Base) MCG/ACT inhaler INHALE 1-2 PUFFS INTO THE LUNGS EVERY SIX HOURS AS NEEDED FOR WHEEZING OR SHORTNESS OF  BREATH.   aspirin 81 MG chewable tablet Chew 1 tablet (81 mg total) by mouth daily.   atorvastatin (LIPITOR) 80 MG tablet Take 1 tablet (80 mg total) by mouth daily.   blood glucose meter kit and supplies KIT Dispense based on patient and insurance preference. Use up to four times daily as directed.   Blood Glucose Monitoring Suppl (TRUE METRIX METER) w/Device KIT USE UP TO FOUR TIMES DAILY AS DIRECTED.   carvedilol (COREG) 25 MG tablet Take 1 tablet (25 mg total) by mouth 2 (two) times daily.   clopidogrel (PLAVIX) 75 MG tablet Take 1 tablet (75 mg total) by mouth daily.   famotidine (PEPCID) 20 MG tablet TAKE 1 TABLET (20 MG TOTAL) BY MOUTH DAILY.   ferrous sulfate 325 (65 FE) MG tablet Take 1 tablet (325 mg total) by mouth daily with breakfast.   glucose blood (TRUE METRIX BLOOD GLUCOSE TEST) test strip USE UP TO FOUR TIMES DAILY AS DIRECTED   hydrALAZINE (APRESOLINE) 50 MG tablet Take 1 tablet (50 mg total) by mouth every 8 (eight) hours.   insulin aspart (NOVOLOG) 100 UNIT/ML FlexPen Inject 8 Units into the skin 3 (three) times daily with meals.   insulin detemir (LEVEMIR) 100 UNIT/ML FlexPen Inject 39 Units into the skin at bedtime.   Insulin Pen Needle 31G X 6 MM MISC Use as directed with breakfast, with lunch, and with evening meal.   Insulin Pen Needle 32G X 4 MM MISC USE AS DIRECTED WITH BREAKFAST, LUNCH, AND EVENING MEAL.   Multiple Vitamin (MULTIVITAMIN WITH MINERALS) TABS tablet Take 1 tablet by mouth daily.   potassium chloride (KLOR-CON)  10 MEQ tablet Take 1 tablet (10 mEq total) by mouth 2 (two) times daily.   torsemide (DEMADEX) 20 MG tablet Take 1 tablet (20 mg total) by mouth daily.   TRUEplus Lancets 28G MISC USE UP TO FOUR TIMES DAILY AS DIRECTED    Allergies: Patient has no known allergies.  Social History   Tobacco Use   Smoking status: Never   Smokeless tobacco: Never  Vaping Use   Vaping Use: Never used  Substance Use Topics   Alcohol use: Never   Drug use:  Never    Family History  Problem Relation Age of Onset   Hypertension Mother    Kidney disease Mother    Heart Problems Maternal Grandmother    Heart attack Maternal Grandmother    Heart disease Maternal Grandmother     Review of Systems: A 12-system review of systems was performed and was negative except as noted in the HPI.  --------------------------------------------------------------------------------------------------  Physical Exam: BP 134/82 (BP Location: Left Arm, Patient Position: Sitting, Cuff Size: Large)   Pulse 93   Ht _0  (1.778 m)   Wt (!) 454 lb (205.9 kg)   SpO2 94%   BMI 65.14 kg/m   General:  NAD. Neck: Unable to assess JVP due to body habitus. Lungs: Mildly diminished breath sounds throughout. Heart: Distant heart sounds.  Regular rate and rhythm without murmurs, rubs, or gallops. Abdomen: Soft, nontender, nondistended. Extremities: 1+ chronic edema in calves and thighs.  EKG:  NSR without abnormality.  Lab Results  Component Value Date   WBC 7.8 05/11/2021   HGB 8.0 (L) 05/11/2021   HCT 27.2 (L) 05/11/2021   MCV 79.1 (L) 05/11/2021   PLT 344 05/11/2021    Lab Results  Component Value Date   NA 141 06/15/2021   K 4.1 06/15/2021   CL 108 06/15/2021   CO2 27 06/15/2021   BUN 22 (H) 06/15/2021   CREATININE 1.48 (H) 06/15/2021   GLUCOSE 120 (H) 06/15/2021   ALT 12 04/22/2021    Lab Results  Component Value Date   CHOL 165 01/27/2021   HDL 54 01/27/2021   LDLCALC 65 01/27/2021   TRIG 230 (H) 01/27/2021   CHOLHDL 3.1 01/27/2021    --------------------------------------------------------------------------------------------------  ASSESSMENT AND PLAN: Acute on chronic HFpEF: Mr. [name] complains of worsening DOE and leg swelling accompanied by a 27 pound weight gain since early July.  We will plan to repeat an echocardiogram and increase his torsemide to 40 mg daily.  We will check a BMP and BNP today as well.  VT: Occurred in the  setting of severe COVID-19.  LVEF normalized on echo during that hospitalization.  Continue carvedilol 25 mg BID.  MINOCA: Patient with concern for AMI in the setting of COVID-19 with significant rise in HS-TnI but no significant CAD by cath.  We will complete 12 months of DAPT, as well as high-intensity statin therapy.  Chronic kidney disease: Repeat BMP today in the setting of weight gain/edema and plan to escalate torsemide.  Morbid obesity: BMI >60 with multiple comorbidities.  Weight loss encouraged through diet and exercise.  Follow-up: Return to clinic in 4-6 weeks (after completion of echo).  Nelva Bush, MD 08/20/2021 9:31 AM

## 2021-08-20 NOTE — Patient Instructions (Signed)
Medication Instructions:  - Your physician has recommended you make the following change in your medication:   1) INCREASE torsemide 20 mg- take 2 tablets (40 mg) by mouth once daily   *If you need a refill on your cardiac medications before your next appointment, please call your pharmacy*   Lab Work: - Your physician recommends that you have lab work today: BMP/ BNP  If you have labs (blood work) drawn today and your tests are completely normal, you will receive your results only by: MyChart Message (if you have MyChart) OR A paper copy in the mail If you have any lab test that is abnormal or we need to change your treatment, we will call you to review the results.   Testing/Procedures: - Your physician has requested that you have an echocardiogram. Echocardiography is a painless test that uses sound waves to create images of your heart. It provides your doctor with information about the size and shape of your heart and how well your heart's chambers and valves are working. This procedure takes approximately one hour. There are no restrictions for this procedure. There is a possibility that an IV may need to be started during your test to inject an image enhancing agent. This is done to obtain more optimal pictures of your heart. Therefore we ask that you do at least drink some water prior to coming in to hydrate your veins.    Follow-Up: At [facility], you and your health needs are our priority.  As part of our continuing mission to provide you with exceptional heart care, we have created designated Provider Care Teams.  These Care Teams include your primary Cardiologist (physician) and Advanced Practice Providers (APPs -  Physician Assistants and Nurse Practitioners) who all work together to provide you with the care you need, when you need it.  We recommend signing up for the patient portal called "MyChart".  Sign up information is provided on this After Visit Summary.  MyChart is  used to connect with patients for Virtual Visits (Telemedicine).  Patients are able to view lab/test results, encounter notes, upcoming appointments, etc.  Non-urgent messages can be sent to your provider as well.   To learn more about what you can do with MyChart, go to [email or web address].    Your next appointment:   4-6 week(s)  The format for your next appointment:   In Person  Provider:   You may see Yvonne Kendall, MD or one of the following Advanced Practice Providers on your designated Care Team:   Nicolasa Ducking, NP Eula Listen, PA-C Marisue Ivan, PA-C Cadence Fransico [place], [place]   Other Instructions  Echocardiogram An echocardiogram is a test that uses sound waves (ultrasound) to produce images of the heart. Images from an echocardiogram can provide important information about: Heart size and shape. The size and thickness and movement of your heart's walls. Heart muscle function and strength. Heart valve function or if you have stenosis. Stenosis is when the heart valves are too narrow. If blood is flowing backward through the heart valves (regurgitation). A tumor or infectious growth around the heart valves. Areas of heart muscle that are not working well because of poor blood flow or injury from a heart attack. Aneurysm detection. An aneurysm is a weak or damaged part of an artery wall. The wall bulges out from the normal force of blood pumping through the body. Tell a health care provider about: Any allergies you have. All medicines you are taking, including vitamins, herbs, eye  drops, creams, and over-the-counter medicines. Any blood disorders you have. Any surgeries you have had. Any medical conditions you have. Whether you are pregnant or may be pregnant. What are the risks? Generally, this is a safe test. However, problems may occur, including an allergic reaction to dye (contrast) that may be used during the test. What happens before the test? No specific  preparation is needed. You may eat and drink normally. What happens during the test?  You will take off your clothes from the waist up and put on a hospital gown. Electrodes or electrocardiogram (ECG)patches may be placed on your chest. The electrodes or patches are then connected to a device that monitors your heart rate and rhythm. You will lie down on a table for an ultrasound exam. A gel will be applied to your chest to help sound waves pass through your skin. A handheld device, called a transducer, will be pressed against your chest and moved over your heart. The transducer produces sound waves that travel to your heart and bounce back (or "echo" back) to the transducer. These sound waves will be captured in real-time and changed into images of your heart that can be viewed on a video monitor. The images will be recorded on a computer and reviewed by your health care provider. You may be asked to change positions or hold your breath for a short time. This makes it easier to get different views or better views of your heart. In some cases, you may receive contrast through an IV in one of your veins. This can improve the quality of the pictures from your heart. The procedure may vary among health care providers and hospitals. What can I expect after the test? You may return to your normal, everyday life, including diet, activities, and medicines, unless your health care provider tells you not to do that. Follow these instructions at home: It is up to you to get the results of your test. Ask your health care provider, or the department that is doing the test, when your results will be ready. Keep all follow-up visits. This is important. Summary An echocardiogram is a test that uses sound waves (ultrasound) to produce images of the heart. Images from an echocardiogram can provide important information about the size and shape of your heart, heart muscle function, heart valve function, and other  possible heart problems. You do not need to do anything to prepare before this test. You may eat and drink normally. After the echocardiogram is completed, you may return to your normal, everyday life, unless your health care provider tells you not to do that. This information is not intended to replace advice given to you by your health care provider. Make sure you discuss any questions you have with your health care provider. Document Revised: 07/21/2020 Document Reviewed: 07/21/2020 Elsevier Patient Education  2022 [employer].

## 2021-08-21 ENCOUNTER — Encounter: Payer: Self-pay | Admitting: Internal Medicine

## 2021-08-21 DIAGNOSIS — I252 Old myocardial infarction: Secondary | ICD-10-CM | POA: Insufficient documentation

## 2021-08-21 DIAGNOSIS — I5033 Acute on chronic diastolic (congestive) heart failure: Secondary | ICD-10-CM | POA: Insufficient documentation

## 2021-08-21 LAB — BASIC METABOLIC PANEL
BUN/Creatinine Ratio: 14 (ref 9–20)
BUN: 23 mg/dL — ABNORMAL HIGH (ref 6–20)
CO2: 21 mmol/L (ref 20–29)
Calcium: 9.2 mg/dL (ref 8.7–10.2)
Chloride: 110 mmol/L — ABNORMAL HIGH (ref 96–106)
Creatinine, Ser: 1.62 mg/dL — ABNORMAL HIGH (ref 0.76–1.27)
Glucose: 112 mg/dL — ABNORMAL HIGH (ref 65–99)
Potassium: 4.4 mmol/L (ref 3.5–5.2)
Sodium: 146 mmol/L — ABNORMAL HIGH (ref 134–144)
eGFR: 58 mL/min/{1.73_m2} — ABNORMAL LOW (ref >59)

## 2021-08-21 LAB — BRAIN NATRIURETIC PEPTIDE: BNP: 199.9 pg/mL — ABNORMAL HIGH (ref 0.0–100.0)

## 2021-08-23 ENCOUNTER — Telehealth: Payer: Self-pay | Admitting: Internal Medicine

## 2021-08-23 DIAGNOSIS — I5033 Acute on chronic diastolic (congestive) heart failure: Secondary | ICD-10-CM

## 2021-08-23 NOTE — Telephone Encounter (Signed)
Yvonne Kendall, MD  08/23/2021  7:13 AM EDT Back to Top    Please let Mr. Hoglund know that his labs are notable for slight interval worsening in his kidney function.  I recommend that he continue the increased dose of torsemide that we discussed last week and have a repeat BMP this Thursday or Friday.

## 2021-08-23 NOTE — Telephone Encounter (Signed)
Attempted to call the patient. No answer- I left a message to please call back.  

## 2021-08-25 ENCOUNTER — Other Ambulatory Visit: Payer: Self-pay

## 2021-08-25 NOTE — Telephone Encounter (Signed)
Called and spoke with patient. He is scheduled to come in Friday for a lab draw in office.

## 2021-08-26 ENCOUNTER — Other Ambulatory Visit: Payer: Self-pay

## 2021-08-26 NOTE — Telephone Encounter (Signed)
Attempted to call the patient. No answer- I left a detailed message of results and Dr. Serita Kyle recommendations. I asked that he please call back to arrange for repeat lab work to be done, preferably tomorrow.

## 2021-08-27 ENCOUNTER — Other Ambulatory Visit: Payer: Self-pay

## 2021-08-27 ENCOUNTER — Other Ambulatory Visit (INDEPENDENT_AMBULATORY_CARE_PROVIDER_SITE_OTHER): Payer: Self-pay

## 2021-08-27 DIAGNOSIS — I5033 Acute on chronic diastolic (congestive) heart failure: Secondary | ICD-10-CM

## 2021-08-28 LAB — BASIC METABOLIC PANEL
BUN/Creatinine Ratio: 18 (ref 9–20)
BUN: 28 mg/dL — ABNORMAL HIGH (ref 6–20)
CO2: 17 mmol/L — ABNORMAL LOW (ref 20–29)
Calcium: 8.6 mg/dL — ABNORMAL LOW (ref 8.7–10.2)
Chloride: 108 mmol/L — ABNORMAL HIGH (ref 96–106)
Creatinine, Ser: 1.59 mg/dL — ABNORMAL HIGH (ref 0.76–1.27)
Glucose: 144 mg/dL — ABNORMAL HIGH (ref 65–99)
Potassium: 5.2 mmol/L (ref 3.5–5.2)
Sodium: 145 mmol/L — ABNORMAL HIGH (ref 134–144)
eGFR: 60 mL/min/{1.73_m2} (ref >59)

## 2021-09-03 ENCOUNTER — Other Ambulatory Visit: Payer: Self-pay

## 2021-09-07 ENCOUNTER — Other Ambulatory Visit: Payer: Self-pay

## 2021-09-22 ENCOUNTER — Other Ambulatory Visit: Payer: Self-pay

## 2021-09-22 ENCOUNTER — Ambulatory Visit (INDEPENDENT_AMBULATORY_CARE_PROVIDER_SITE_OTHER): Payer: Self-pay

## 2021-09-22 DIAGNOSIS — I5033 Acute on chronic diastolic (congestive) heart failure: Secondary | ICD-10-CM

## 2021-09-22 LAB — ECHOCARDIOGRAM COMPLETE
AR max vel: 2.5 cm2
AV Area VTI: 2.84 cm2
AV Area mean vel: 2.47 cm2
AV Mean grad: 6 mmHg
AV Peak grad: 11.4 mmHg
Ao pk vel: 1.69 m/s
Calc EF: 51.8 %
Single Plane A2C EF: 52.8 %
Single Plane A4C EF: 52.7 %

## 2021-09-27 ENCOUNTER — Other Ambulatory Visit: Payer: Self-pay

## 2021-09-27 ENCOUNTER — Other Ambulatory Visit: Payer: Self-pay | Admitting: Family Medicine

## 2021-09-27 ENCOUNTER — Other Ambulatory Visit: Payer: Self-pay | Admitting: Physician Assistant

## 2021-09-27 DIAGNOSIS — E785 Hyperlipidemia, unspecified: Secondary | ICD-10-CM

## 2021-09-27 DIAGNOSIS — D500 Iron deficiency anemia secondary to blood loss (chronic): Secondary | ICD-10-CM

## 2021-09-27 NOTE — Telephone Encounter (Signed)
Requested medications are due for refill today.  yes  Requested medications are on the active medications list.  yes  Last refill. 04/22/2021  Future visit scheduled.   no  Notes to clinic.  Vernon Prey listed as PCP.

## 2021-09-27 NOTE — Telephone Encounter (Signed)
Requested medications are due for refill today.  yes  Requested medications are on the active medications list.  yes  Last refill. 07/14/2021  Future visit scheduled.   no  Notes to clinic.  PCP listed is [employer].

## 2021-09-29 ENCOUNTER — Other Ambulatory Visit: Payer: Self-pay

## 2021-09-30 ENCOUNTER — Ambulatory Visit: Payer: Self-pay | Admitting: Internal Medicine

## 2021-09-30 ENCOUNTER — Encounter: Payer: Self-pay | Admitting: Medical

## 2021-09-30 ENCOUNTER — Ambulatory Visit (INDEPENDENT_AMBULATORY_CARE_PROVIDER_SITE_OTHER): Payer: Self-pay | Admitting: Medical

## 2021-09-30 ENCOUNTER — Other Ambulatory Visit: Payer: Self-pay

## 2021-09-30 VITALS — BP 178/110 | HR 103 | Ht 70.0 in | Wt >= 6400 oz

## 2021-09-30 DIAGNOSIS — I5033 Acute on chronic diastolic (congestive) heart failure: Secondary | ICD-10-CM

## 2021-09-30 DIAGNOSIS — I472 Ventricular tachycardia, unspecified: Secondary | ICD-10-CM

## 2021-09-30 DIAGNOSIS — N189 Chronic kidney disease, unspecified: Secondary | ICD-10-CM

## 2021-09-30 DIAGNOSIS — D500 Iron deficiency anemia secondary to blood loss (chronic): Secondary | ICD-10-CM

## 2021-09-30 DIAGNOSIS — R600 Localized edema: Secondary | ICD-10-CM

## 2021-09-30 MED ORDER — POTASSIUM CHLORIDE ER 10 MEQ PO TBCR
10.0000 meq | EXTENDED_RELEASE_TABLET | Freq: Two times a day (BID) | ORAL | 2 refills | Status: DC
  Filled 2021-09-30: qty 60, 30d supply, fill #0
  Filled 2021-11-29: qty 60, 30d supply, fill #1
  Filled 2022-02-11: qty 60, 30d supply, fill #0
  Filled 2022-02-11: qty 60, 30d supply, fill #2
  Filled 2022-04-28: qty 60, 30d supply, fill #1

## 2021-09-30 MED ORDER — FAMOTIDINE 20 MG PO TABS
ORAL_TABLET | Freq: Every day | ORAL | 2 refills | Status: DC
  Filled 2021-09-30: qty 30, 30d supply, fill #0
  Filled 2021-10-21: qty 30, 30d supply, fill #1
  Filled 2021-11-29: qty 30, 30d supply, fill #2
  Filled 2022-01-17: qty 30, 30d supply, fill #3
  Filled 2022-01-17: qty 30, 30d supply, fill #0
  Filled 2022-02-23: qty 30, 30d supply, fill #1
  Filled 2022-03-22: qty 30, 30d supply, fill #2
  Filled 2022-04-28: qty 30, 30d supply, fill #3

## 2021-09-30 MED ORDER — HYDRALAZINE HCL 100 MG PO TABS
100.0000 mg | ORAL_TABLET | Freq: Three times a day (TID) | ORAL | 0 refills | Status: DC
  Filled 2021-09-30 – 2022-03-14 (×2): qty 90, 30d supply, fill #0

## 2021-09-30 NOTE — Progress Notes (Signed)
Cardiology Office Note:    Date:  09/30/2021   ID:  Erik [name], DOB 07-15-1991, MRN [id number]  PCP:  Antony Madura, PA-C  [facility] Cardiologist:  Nelva Bush, MD  [facility] Electrophysiologist:  None   Referring MD: Antony Madura, PA-C   Chief Complaint: 4-6 week follow-up  History of Present Illness:    Erik [name] is a 30 y.o. male with a hx of asthma, morbid obesity, DM2, COVID 33 requiring hospitalization with cardiomyopathy, morbid obesity, ventricuar tachycardia, HLD, ARDS and AKI, anemia who presents for follow-up.    Patient was admitted 01/12/21 for COVID 19 PNA and ARDS with severe acidosis and severe hypoxia with acute renal failure requiring intubation and CRRT. Treated with Remdesivir and steroids. On 01/19/21 he had a STEMI with cardiac cath 01/20/21 with no significant coronary artery disease. Myocarditis was suspected. DAPT for 1 year was recommended. His EF on cath was 25% and by subsequent echo had normalized. He was diagnosed with diabetes. He was discharged to IP rehab where he stayed 02/03/21-02/26/21 and then was discharged home.    Hospitalized 03/07/21-03/10/21 due to worsening shortness of breath with acute hypoxic respiratory failure secondary to healthcare associated PNA as well as sepsis. He was treated with IV abx. He was also noted to have symptomatic anemia with no evidence of bleeding and required 1 unit PRBCs   Seen by nephrology 04/12/21 and started on Torsemide 16m daily due to elevated BP and lower extremity edema.    Seen in follow-up 5/5 with improved dyspnea and LE. Say PCP 5/12 and had 13lbs weight gain and Torsemide was increased to 219mdaily for 5 days.    Seen 5//31/22 and was still volume overloaded. Torsemide was increased to 3074mor 3 days and then back down to 64m56mily. Amlodipine was decreased.   Seen 06/15/21 and amlodipine was stopped for LLE. Metoprolol was changed to Coreg. Last seen 08/20/21 and echo was ordered. Torsemide  was increased to 40mg67mly.   Echo showed LVEF 55%, no WMA, mild LVH, no valvular abnormalities.   Today, echo was reviewed in detail . He says he still has some lower leg swelling, also possible in the thighs. No chest pain or shortness of breath. BP high, he is on max dose coreg. Can increase hydralazine. Reports he does not follow 2L fluid restriction. Also very sedentary at baseline. Says diabetic ulcers are healing.    Past Medical History:  Diagnosis Date   Asthma    Diabetes mellitus without complication (HCC) [place] Past Surgical History:  Procedure Laterality Date   CARDIAC CATHETERIZATION     LEFT HEART CATH AND CORONARY ANGIOGRAPHY N/A 01/20/2021   Procedure: LEFT HEART CATH AND CORONARY ANGIOGRAPHY;  Surgeon: End, Nelva Bush  [facility];  Service: Cardiovascular;  Laterality: N/A;    Current Medications: Current Meds  Medication Sig   albuterol (VENTOLIN HFA) 108 (90 Base) MCG/ACT inhaler INHALE 1-2 PUFFS INTO THE LUNGS EVERY SIX HOURS AS NEEDED FOR WHEEZING OR SHORTNESS OF BREATH.   aspirin 81 MG chewable tablet Chew 1 tablet (81 mg total) by mouth daily.   atorvastatin (LIPITOR) 80 MG tablet Take 1 tablet (80 mg total) by mouth daily.   blood glucose meter kit and supplies KIT Dispense based on patient and insurance preference. Use up to four times daily as directed.   Blood Glucose Monitoring Suppl (TRUE METRIX METER) w/Device KIT USE UP TO FOUR TIMES DAILY AS  DIRECTED.   carvedilol (COREG) 25 MG tablet Take 1 tablet (25 mg total) by mouth 2 (two) times daily.   clopidogrel (PLAVIX) 75 MG tablet Take 1 tablet (75 mg total) by mouth daily.   ferrous sulfate 325 (65 FE) MG tablet Take 1 tablet (325 mg total) by mouth daily with breakfast.   glucose blood (TRUE METRIX BLOOD GLUCOSE TEST) test strip USE UP TO FOUR TIMES DAILY AS DIRECTED   hydrALAZINE (APRESOLINE) 100 MG tablet Take 1 tablet (100 mg total) by mouth 3 (three) times daily.   insulin  aspart (NOVOLOG) 100 UNIT/ML FlexPen Inject 8 Units into the skin 3 (three) times daily with meals.   insulin detemir (LEVEMIR) 100 UNIT/ML FlexPen Inject 39 Units into the skin at bedtime.   Insulin Pen Needle 31G X 6 MM MISC Use as directed with breakfast, with lunch, and with evening meal.   Insulin Pen Needle 32G X 4 MM MISC USE AS DIRECTED WITH BREAKFAST, LUNCH, AND EVENING MEAL.   Multiple Vitamin (MULTIVITAMIN WITH MINERALS) TABS tablet Take 1 tablet by mouth daily.   torsemide (DEMADEX) 20 MG tablet Take 2 tablets (40 mg total) by mouth daily.   TRUEplus Lancets 28G MISC USE UP TO FOUR TIMES DAILY AS DIRECTED   [DISCONTINUED] famotidine (PEPCID) 20 MG tablet TAKE 1 TABLET (20 MG TOTAL) BY MOUTH DAILY.   [DISCONTINUED] hydrALAZINE (APRESOLINE) 50 MG tablet Take 1 tablet (50 mg total) by mouth every 8 (eight) hours.   [DISCONTINUED] potassium chloride (KLOR-CON) 10 MEQ tablet Take 1 tablet (10 mEq total) by mouth 2 (two) times daily.     Allergies:   Patient has no known allergies.   Social History   Socioeconomic History   Marital status: Single    Spouse name: Not on file   Number of children: Not on file   Years of education: Not on file   Highest education level: Not on file  Occupational History   Not on file  Tobacco Use   Smoking status: Never   Smokeless tobacco: Never  Vaping Use   Vaping Use: Never used  Substance and Sexual Activity   Alcohol use: Never   Drug use: Never   Sexual activity: Never  Other Topics Concern   Not on file  Social History Narrative   Not on file   Social Determinants of Health   Financial Resource Strain: Not on file  Food Insecurity: Not on file  Transportation Needs: Not on file  Physical Activity: Not on file  Stress: Not on file  Social Connections: Not on file     Family History: The patient's family history includes Heart Problems in his maternal grandmother; Heart attack in his maternal grandmother; Heart disease in his  maternal grandmother; Hypertension in his mother; Kidney disease in his mother.  ROS:   Please see the history of present illness.     All other systems reviewed and are negative.  EKGs/Labs/Other Studies Reviewed:    The following studies were reviewed today:  Echo 09/2021 1. Left ventricular ejection fraction, by estimation, is 55%. Left  ventricular ejection fraction by 2D MOD biplane is 51.8 %. The left  ventricle has low normal function. The left ventricle has no regional wall  motion abnormalities. There is mild left  ventricular hypertrophy. Left ventricular diastolic parameters are  indeterminate.   2. Right ventricular systolic function is normal. The right ventricular  size is mildly enlarged.   3. The mitral valve is normal in structure. No  evidence of mitral valve  regurgitation.   4. The aortic valve was not well visualized. Aortic valve regurgitation  is not visualized.   5. The inferior vena cava is dilated in size with >50% respiratory  variability, suggesting right atrial pressure of 8 mmHg.   LHC 2022 Conclusions: Minimal coronary artery plaquing without angiographically significant coronary artery disease. Moderately reduced left ventricular contraction with global hypokinesis. Mildly elevated left ventricular filling pressure (LVEDP ~20 mmHg).   Recommendations: No obvious coronary artery lesion to explain ST segment changes and sustained ventricular tachycardia.  Possible causes include metabolic/electrolyte derangements and myocarditis in the setting of COVID-19. Trend HS-TnI until it has peaked, then stop. Obtain transthoracic echocardiogram. If blood pressure tolerates, consider addition of low-dose beta-blocker. If patient has recurrent VT, consider IV amiodarone. Replete electrolytes to maintain K > 4.0 and Mg > 2.0.   Nelva Bush, MD [facility]    EKG:  EKG is  ordered today.  The ekg ordered today demonstrates ST, 103bpm, , RAD,  nonspecific t wave changes  Recent Labs: 03/10/2021: Magnesium 2.0 04/22/2021: ALT 12 05/11/2021: Hemoglobin 8.0; Platelets 344 08/20/2021: BNP 199.9 08/27/2021: BUN 28; Creatinine, Ser 1.59; Potassium 5.2; Sodium 145  Recent Lipid Panel    Component Value Date/Time   CHOL 165 01/27/2021 1105   TRIG 230 (H) 01/27/2021 1105   HDL 54 01/27/2021 1105   CHOLHDL 3.1 01/27/2021 1105   VLDL 46 (H) 01/27/2021 1105   LDLCALC 65 01/27/2021 1105    Physical Exam:    VS:  BP (!) 178/110 (BP Location: Left Wrist, Patient Position: Sitting, Cuff Size: Large) Comment: After EKG  Pulse (!) 103   Ht 5' 10" (1.778 m)   Wt (!) 455 lb 8 oz (206.6 kg)   SpO2 96%   BMI 65.36 kg/m     Wt Readings from Last 3 Encounters:  09/30/21 (!) 455 lb 8 oz (206.6 kg)  08/20/21 (!) 454 lb (205.9 kg)  06/15/21 (!) 427 lb (193.7 kg)     GEN:  Well nourished, well developed in no acute distress HEENT: Normal NECK: No JVD; No carotid bruits LYMPHATICS: No lymphadenopathy CARDIAC: RRR, no murmurs, rubs, gallops RESPIRATORY:  +wheezing no rhonchi  ABDOMEN: Soft, non-tender, non-distended MUSCULOSKELETAL:  right sided mild lower leg edema; No deformity  SKIN: Warm and dry NEUROLOGIC:  Alert and oriented x 3 PSYCHIATRIC:  Normal affect   ASSESSMENT:    1. Acute on chronic heart failure with preserved ejection fraction (HFpEF) ([place])   2. Iron deficiency anemia due to chronic blood loss   3. Chronic kidney disease, unspecified CKD stage   4. Lower leg edema   5. Morbid obesity ([place])   6. VT (ventricular tachycardia)    PLAN:    In order of problems listed above:  HFpEF Echo showed LVEF 55%, no WMA, mild LVH, mildly enlarge RV. Patient reports swelling initialy improved, but said it started to return. On exam has mild lower leg edema, worse on the right. Reports he is not following fluid restriction. Amlodipine previously discontinued for LLE. Torsemide increased at the last visit. BNP today, if elevated  can increase torsemide to 68m daily. Also instructed on restricting fluid intake.   VT Occurred in the setting of severe COVID -19. LVEF normalized on the echo during hospitalization. Continue Coreg 221mBID.   CKD Continue current torsemide dose, may change based on labs.   Obesity Lfiestlye chagnes dsicussed in detail today.   HTN Elevated today. Recheck was 160/104.  Does not check it at home. Increase hydralazine to 145mTID. May need to add on Imdur vs diltazem, CCB would also help with heart rate. Amlodipine previously stopped for LLE. Continue Coreg 226mBID.   Disposition: Follow up in 1 month(s) with MD/APP   Cadence H Ninfa MeekerPA-C  09/30/2021 4:49 PM    [facility]

## 2021-09-30 NOTE — Patient Instructions (Signed)
Medication Instructions:  Your physician has recommended you make the following change in your medication:   START Hydralazine 100 mg three times a day  *If you need a refill on your cardiac medications before your next appointment, please call your pharmacy*   Lab Work: BNP today  If you have labs (blood work) drawn today and your tests are completely normal, you will receive your results only by: MyChart Message (if you have MyChart) OR A paper copy in the mail If you have any lab test that is abnormal or we need to change your treatment, we will call you to review the results.   Testing/Procedures: None   Follow-Up: At [facility], you and your health needs are our priority.  As part of our continuing mission to provide you with exceptional heart care, we have created designated Provider Care Teams.  These Care Teams include your primary Cardiologist (physician) and Advanced Practice Providers (APPs -  Physician Assistants and Nurse Practitioners) who all work together to provide you with the care you need, when you need it.  We recommend signing up for the patient portal called "MyChart".  Sign up information is provided on this After Visit Summary.  MyChart is used to connect with patients for Virtual Visits (Telemedicine).  Patients are able to view lab/test results, encounter notes, upcoming appointments, etc.  Non-urgent messages can be sent to your provider as well.   To learn more about what you can do with MyChart, go to [email or web address].    Your next appointment:   1 month(s)  The format for your next appointment:   In Person  Provider:   You may see Yvonne Kendall, MD or one of the following Advanced Practice Providers on your designated Care Team:   Nicolasa Ducking, NP Eula Listen, PA-C Marisue Ivan, PA-C Cadence [place], [place]

## 2021-10-01 ENCOUNTER — Other Ambulatory Visit: Payer: Self-pay

## 2021-10-01 LAB — BRAIN NATRIURETIC PEPTIDE: BNP: 116.8 pg/mL — ABNORMAL HIGH (ref 0.0–100.0)

## 2021-10-21 ENCOUNTER — Other Ambulatory Visit: Payer: Self-pay

## 2021-10-21 ENCOUNTER — Other Ambulatory Visit: Payer: Self-pay | Admitting: Physician Assistant

## 2021-10-21 DIAGNOSIS — E785 Hyperlipidemia, unspecified: Secondary | ICD-10-CM

## 2021-10-21 MED ORDER — ATORVASTATIN CALCIUM 80 MG PO TABS
80.0000 mg | ORAL_TABLET | Freq: Every day | ORAL | 2 refills | Status: DC
  Filled 2021-10-21: qty 30, 30d supply, fill #0
  Filled 2022-01-17: qty 30, 30d supply, fill #1
  Filled 2022-01-17: qty 30, 30d supply, fill #0

## 2021-10-21 NOTE — Telephone Encounter (Signed)
Requested Prescriptions  Pending Prescriptions Disp Refills  . atorvastatin (LIPITOR) 80 MG tablet 30 tablet 2    Sig: Take 1 tablet (80 mg total) by mouth daily.     Cardiovascular:  Antilipid - Statins Failed - 10/21/2021  8:59 AM      Failed - Triglycerides in normal range and within 360 days    Triglycerides  Date Value Ref Range Status  01/27/2021 230 (H) <150 mg/dL Final         Passed - Total Cholesterol in normal range and within 360 days    Cholesterol  Date Value Ref Range Status  01/27/2021 165 0 - 200 mg/dL Final         Passed - LDL in normal range and within 360 days    LDL Cholesterol  Date Value Ref Range Status  01/27/2021 65 0 - 99 mg/dL Final    Comment:           Total Cholesterol/HDL:CHD Risk Coronary Heart Disease Risk Table                     Men   Women  1/2 Average Risk   3.4   3.3  Average Risk       5.0   4.4  2 X Average Risk   9.6   7.1  3 X Average Risk  23.4   11.0        Use the calculated Patient Ratio above and the CHD Risk Table to determine the patient's CHD Risk.        ATP III CLASSIFICATION (LDL):  <100     mg/dL   Optimal  397-673  mg/dL   Near or Above                    Optimal  130-159  mg/dL   Borderline  419-379  mg/dL   High  >024     mg/dL   Very High Performed at [facility], [address] [place], [place] [address]          Passed - HDL in normal range and within 360 days    HDL  Date Value Ref Range Status  01/27/2021 54 >40 mg/dL Final         Passed - Patient is not pregnant      Passed - Valid encounter within last 12 months    Recent Outpatient Visits          4 months ago Type 2 diabetes mellitus with foot ulcer, with long-term current use of insulin (HCC)   [facility] Hoy Register, MD   6 months ago Diabetes mellitus, new onset [facility])   [facility], [place], [place]      Future Appointments            In 1 week  Furth, Cadence H, PA-C [facility], [facility]

## 2021-10-22 ENCOUNTER — Other Ambulatory Visit: Payer: Self-pay

## 2021-10-29 ENCOUNTER — Other Ambulatory Visit: Payer: Self-pay

## 2021-10-29 ENCOUNTER — Encounter: Payer: Self-pay | Admitting: Medical

## 2021-10-29 ENCOUNTER — Ambulatory Visit (INDEPENDENT_AMBULATORY_CARE_PROVIDER_SITE_OTHER): Payer: Self-pay | Admitting: Medical

## 2021-10-29 VITALS — BP 150/100 | HR 94 | Ht 70.0 in | Wt >= 6400 oz

## 2021-10-29 DIAGNOSIS — N189 Chronic kidney disease, unspecified: Secondary | ICD-10-CM

## 2021-10-29 DIAGNOSIS — I5032 Chronic diastolic (congestive) heart failure: Secondary | ICD-10-CM

## 2021-10-29 DIAGNOSIS — I472 Ventricular tachycardia, unspecified: Secondary | ICD-10-CM

## 2021-10-29 DIAGNOSIS — Z79899 Other long term (current) drug therapy: Secondary | ICD-10-CM

## 2021-10-29 DIAGNOSIS — I10 Essential (primary) hypertension: Secondary | ICD-10-CM

## 2021-10-29 DIAGNOSIS — R600 Localized edema: Secondary | ICD-10-CM

## 2021-10-29 NOTE — Progress Notes (Signed)
Cardiology Office Note:    Date:  10/29/2021   ID:  Erik [name], DOB Oct 25, 1991, MRN [id number]  PCP:  Pcp, No  [facility] Cardiologist:  Nelva Bush, MD  [facility] Electrophysiologist:  None   Referring MD: Antony Madura, PA-C   Chief Complaint: 1 month follow-up  History of Present Illness:    Erik [name] is a 30 y.o. male with a hx of asthma, morbid obesity, DM2, COVID 12 requiring hospitalization with cardiomyopathy, morbid obesity, ventricuar tachycardia, HLD, ARDS and AKI, anemia who presents for follow-up.    Patient was admitted 01/12/21 for COVID 19 PNA and ARDS with severe acidosis and severe hypoxia with acute renal failure requiring intubation and CRRT. Treated with Remdesivir and steroids. On 01/19/21 he had a STEMI with cardiac cath 01/20/21 with no significant coronary artery disease. Myocarditis was suspected. DAPT for 1 year was recommended. His EF on cath was 25% and by subsequent echo had normalized. He was diagnosed with diabetes. He was discharged to IP rehab where he stayed 02/03/21-02/26/21 and then was discharged home.    Hospitalized 03/07/21-03/10/21 due to worsening shortness of breath with acute hypoxic respiratory failure secondary to healthcare associated PNA as well as sepsis. He was treated with IV abx. He was also noted to have symptomatic anemia with no evidence of bleeding and required 1 unit PRBCs   Seen by nephrology 04/12/21 and started on Torsemide 24m daily due to elevated BP and lower extremity edema.    Seen in follow-up 5/5 with improved dyspnea and LE. Say PCP 5/12 and had 13lbs weight gain and Torsemide was increased to 260mdaily for 5 days.    Seen 5//31/22 and was still volume overloaded. Torsemide was increased to 3053mor 3 days and then back down to 68m88mily. Amlodipine was decreased.    Seen 06/15/21 and amlodipine was stopped for LLE. Metoprolol was changed to Coreg. Last seen 08/20/21 and echo was ordered. Torsemide was increased  to 40mg84mly.    Echo showed LVEF 55%, no WMA, mild LVH, no valvular abnormalities.   Last seen 09/30/21 and reported LLE. BP was high and hydralazine was increased. BNP was 116.   Today, BP still a little high. Does not check it at home. Feels better since the last visit. He is trying to walk more. Diabetic ulcers improving. He reports improved swelling. He is better about eating less salt, he elevates his feet. He takes torsemide 40mg 74my. Can still do better at drinking less fluid. Discussed adding another medication for BP, but he would like to make lifestyle changes.   Past Medical History:  Diagnosis Date   Asthma    Diabetes mellitus without complication (HCC)  [place]:  Procedure Laterality Date   CARDIAC CATHETERIZATION     LEFT HEART CATH AND CORONARY ANGIOGRAPHY N/A 01/20/2021   Procedure: LEFT HEART CATH AND CORONARY ANGIOGRAPHY;  Surgeon: End, CNelva Bush [facility];  Service: Cardiovascular;  Laterality: N/A;    Current Medications: Current Meds  Medication Sig   albuterol (VENTOLIN HFA) 108 (90 Base) MCG/ACT inhaler INHALE 1-2 PUFFS INTO THE LUNGS EVERY SIX HOURS AS NEEDED FOR WHEEZING OR SHORTNESS OF BREATH.   aspirin 81 MG chewable tablet Chew 1 tablet (81 mg total) by mouth daily.   atorvastatin (LIPITOR) 80 MG tablet Take 1 tablet (80 mg total) by mouth daily.   blood glucose meter kit and supplies KIT Dispense based on patient  and insurance preference. Use up to four times daily as directed.   Blood Glucose Monitoring Suppl (TRUE METRIX METER) w/Device KIT USE UP TO FOUR TIMES DAILY AS DIRECTED.   carvedilol (COREG) 25 MG tablet Take 1 tablet (25 mg total) by mouth 2 (two) times daily.   clopidogrel (PLAVIX) 75 MG tablet Take 1 tablet (75 mg total) by mouth daily.   famotidine (PEPCID) 20 MG tablet TAKE 1 TABLET (20 MG TOTAL) BY MOUTH DAILY.   ferrous sulfate 325 (65 FE) MG tablet Take 1 tablet (325 mg total) by mouth  daily with breakfast.   glucose blood (TRUE METRIX BLOOD GLUCOSE TEST) test strip USE UP TO FOUR TIMES DAILY AS DIRECTED   hydrALAZINE (APRESOLINE) 100 MG tablet Take 1 tablet (100 mg total) by mouth 3 (three) times daily.   insulin aspart (NOVOLOG) 100 UNIT/ML FlexPen Inject 8 Units into the skin 3 (three) times daily with meals.   insulin detemir (LEVEMIR) 100 UNIT/ML FlexPen Inject 39 Units into the skin at bedtime.   Insulin Pen Needle 31G X 6 MM MISC Use as directed with breakfast, with lunch, and with evening meal.   Insulin Pen Needle 32G X 4 MM MISC USE AS DIRECTED WITH BREAKFAST, LUNCH, AND EVENING MEAL.   Multiple Vitamin (MULTIVITAMIN WITH MINERALS) TABS tablet Take 1 tablet by mouth daily.   potassium chloride (KLOR-CON) 10 MEQ tablet Take 1 tablet (10 mEq total) by mouth 2 (two) times daily.   torsemide (DEMADEX) 20 MG tablet Take 2 tablets (40 mg total) by mouth daily.   TRUEplus Lancets 28G MISC USE UP TO FOUR TIMES DAILY AS DIRECTED     Allergies:   Patient has no known allergies.   Social History   Socioeconomic History   Marital status: Single    Spouse name: Not on file   Number of children: Not on file   Years of education: Not on file   Highest education level: Not on file  Occupational History   Not on file  Tobacco Use   Smoking status: Never   Smokeless tobacco: Never  Vaping Use   Vaping Use: Never used  Substance and Sexual Activity   Alcohol use: Never   Drug use: Never   Sexual activity: Never  Other Topics Concern   Not on file  Social History Narrative   Not on file   Social Determinants of Health   Financial Resource Strain: Not on file  Food Insecurity: Not on file  Transportation Needs: Not on file  Physical Activity: Not on file  Stress: Not on file  Social Connections: Not on file     Family History: The patient's family history includes Heart Problems in his maternal grandmother; Heart attack in his maternal grandmother; Heart  disease in his maternal grandmother; Hypertension in his mother; Kidney disease in his mother.  ROS:   Please see the history of present illness.     All other systems reviewed and are negative.  EKGs/Labs/Other Studies Reviewed:    The following studies were reviewed today:  Echo 09/2021 1. Left ventricular ejection fraction, by estimation, is 55%. Left  ventricular ejection fraction by 2D MOD biplane is 51.8 %. The left  ventricle has low normal function. The left ventricle has no regional wall  motion abnormalities. There is mild left  ventricular hypertrophy. Left ventricular diastolic parameters are  indeterminate.   2. Right ventricular systolic function is normal. The right ventricular  size is mildly enlarged.   3. The  mitral valve is normal in structure. No evidence of mitral valve  regurgitation.   4. The aortic valve was not well visualized. Aortic valve regurgitation  is not visualized.   5. The inferior vena cava is dilated in size with >50% respiratory  variability, suggesting right atrial pressure of 8 mmHg.    LHC 2022 Conclusions: Minimal coronary artery plaquing without angiographically significant coronary artery disease. Moderately reduced left ventricular contraction with global hypokinesis. Mildly elevated left ventricular filling pressure (LVEDP ~20 mmHg).   Recommendations: No obvious coronary artery lesion to explain ST segment changes and sustained ventricular tachycardia.  Possible causes include metabolic/electrolyte derangements and myocarditis in the setting of COVID-19. Trend HS-TnI until it has peaked, then stop. Obtain transthoracic echocardiogram. If blood pressure tolerates, consider addition of low-dose beta-blocker. If patient has recurrent VT, consider IV amiodarone. Replete electrolytes to maintain K > 4.0 and Mg > 2.0.   Nelva Bush, MD [facility]  EKG:  EKG is not ordered today.    Recent Labs: 03/10/2021: Magnesium  2.0 04/22/2021: ALT 12 05/11/2021: Hemoglobin 8.0; Platelets 344 08/27/2021: BUN 28; Creatinine, Ser 1.59; Potassium 5.2; Sodium 145 09/30/2021: BNP 116.8  Recent Lipid Panel    Component Value Date/Time   CHOL 165 01/27/2021 1105   TRIG 230 (H) 01/27/2021 1105   HDL 54 01/27/2021 1105   CHOLHDL 3.1 01/27/2021 1105   VLDL 46 (H) 01/27/2021 1105   LDLCALC 65 01/27/2021 1105    Physical Exam:    VS:  BP (!) 150/100 (BP Location: Left Wrist, Patient Position: Sitting, Cuff Size: Large)   Pulse 94   Ht _0  (1.778 m)   Wt (!) 455 lb 2 oz (206.4 kg)   SpO2 97%   BMI 65.30 kg/m     Wt Readings from Last 3 Encounters:  10/29/21 (!) 455 lb 2 oz (206.4 kg)  09/30/21 (!) 455 lb 8 oz (206.6 kg)  08/20/21 (!) 454 lb (205.9 kg)     GEN:  Well nourished, well developed in no acute distress HEENT: Normal NECK: No JVD; No carotid bruits LYMPHATICS: No lymphadenopathy CARDIAC: RRR, no murmurs, rubs, gallops RESPIRATORY:  Clear to auscultation without rales, wheezing or rhonchi  ABDOMEN: Soft, non-tender, non-distended MUSCULOSKELETAL:  1+ pedal edema; No deformity  SKIN: Warm and dry NEUROLOGIC:  Alert and oriented x 3 PSYCHIATRIC:  Normal affect   ASSESSMENT:    1. Chronic diastolic heart failure ([place])   2. Medication management   3. Lower leg edema   4. Essential hypertension   5. Morbid obesity ([place])   6. VT (ventricular tachycardia)   7. Chronic kidney disease, unspecified CKD stage    PLAN:    In order of problems listed above:  HFpEF Patient reports he is overall feeling better, trying to be more active. He has 1+pedal edema on exam. I will increase Torsemide for 3 days to 71m BID and then back down to 437mdaily. BMET in a week. Continue Coreg and hydralazine. Encouraged low salt diet, compression socks and leg elevation. Also needs to be better about fluid intake.   VT Occurred int he setting of severe COVID 19 infection. LVEF normalized on the echo during  hospitalization. Continue Coreg 2526mID.  CKD Follow-up labs in a week.   Obesity Recommended weight loss with lifestyle changes. He is trying to be more active as diabetic ulcers heal.   HTN BP better, but still elevated today, also elevated on re-check. He is on hydralazine 100m44mD and  ccoreg 57m BID. Amlodipine previously stopped for LLE. Discussed addition of medication, however patient would like to work on lifestyle changes before adding another medication. Will see him back in a month. If BP is still high will need another antihypertensive.   Disposition: Follow up in 1 month(s) with MD/APP    Signed, Rushi Chasen HNinfa Meeker PA-C  10/29/2021 8:31 AM    [facility]

## 2021-10-29 NOTE — Patient Instructions (Signed)
Medication Instructions:  - Your physician has recommended you make the following change in your medication:   1) INCREASE torsemide 20 mg: - take 2 tablets (40 mg) by mouth TWICE daily x 3 days, then - resume 2 tablets (40 mg) by mouth ONCE daily  *If you need a refill on your cardiac medications before your next appointment, please call your pharmacy*   Lab Work: - Your physician recommends that you return for lab work in: 1 week- BMP  If you have labs (blood work) drawn today and your tests are completely normal, you will receive your results only by: MyChart Message (if you have MyChart) OR A paper copy in the mail If you have any lab test that is abnormal or we need to change your treatment, we will call you to review the results.   Testing/Procedures: - none ordered   Follow-Up: At [facility], you and your health needs are our priority.  As part of our continuing mission to provide you with exceptional heart care, we have created designated Provider Care Teams.  These Care Teams include your primary Cardiologist (physician) and Advanced Practice Providers (APPs -  Physician Assistants and Nurse Practitioners) who all work together to provide you with the care you need, when you need it.  We recommend signing up for the patient portal called "MyChart".  Sign up information is provided on this After Visit Summary.  MyChart is used to connect with patients for Virtual Visits (Telemedicine).  Patients are able to view lab/test results, encounter notes, upcoming appointments, etc.  Non-urgent messages can be sent to your provider as well.   To learn more about what you can do with MyChart, go to [email or web address].    Your next appointment:   1 month(s)  The format for your next appointment:   In Person  Provider:   You may see Yvonne Kendall, MD or one of the following Advanced Practice Providers on your designated Care Team:   Nicolasa Ducking, NP Eula Listen,  PA-C Cadence Fransico Michael, [place]    Other Instructions N/a

## 2021-11-08 ENCOUNTER — Other Ambulatory Visit (INDEPENDENT_AMBULATORY_CARE_PROVIDER_SITE_OTHER): Payer: Self-pay

## 2021-11-08 ENCOUNTER — Other Ambulatory Visit: Payer: Self-pay

## 2021-11-08 DIAGNOSIS — Z79899 Other long term (current) drug therapy: Secondary | ICD-10-CM

## 2021-11-09 ENCOUNTER — Telehealth: Payer: Self-pay | Admitting: Medical

## 2021-11-09 DIAGNOSIS — I5033 Acute on chronic diastolic (congestive) heart failure: Secondary | ICD-10-CM

## 2021-11-09 LAB — BASIC METABOLIC PANEL
BUN/Creatinine Ratio: 21 — ABNORMAL HIGH (ref 9–20)
BUN: 50 mg/dL — ABNORMAL HIGH (ref 6–20)
CO2: 12 mmol/L — ABNORMAL LOW (ref 20–29)
Calcium: 9.3 mg/dL (ref 8.7–10.2)
Chloride: 112 mmol/L — ABNORMAL HIGH (ref 96–106)
Creatinine, Ser: 2.41 mg/dL — ABNORMAL HIGH (ref 0.76–1.27)
Glucose: 164 mg/dL — ABNORMAL HIGH (ref 70–99)
Potassium: 4.8 mmol/L (ref 3.5–5.2)
Sodium: 140 mmol/L (ref 134–144)
eGFR: 36 mL/min/{1.73_m2} — ABNORMAL LOW (ref >59)

## 2021-11-09 NOTE — Telephone Encounter (Signed)
I spoke with the patient regarding his BMP results from 11/08/21. He is aware of Cadence Furth, PA's recommendations to: 1) hold torsemide x 3 days, then resume at his current dosing 2) repeat a BMP in 1 week  The patient voices understanding of these recommendations, but advised he is scheduled to follow up with Nephrology- Dr. Thedore Mins on 11/15/21.  I advised the patient we will forward a copy of his most recent lab results to Dr. Thedore Mins and let him follow up on his renal function next week.  The patient is agreeable.  He is also aware of his appointment with Ward Givens, NP on 11/26/21.   To Cadence as a FYI.

## 2021-11-09 NOTE — Telephone Encounter (Signed)
Cadence David Stall, PA-C  11/09/2021 12:37 PM EST     Labs show dehydration. Would hold torsemide for 3 days and re-check BMET in a week.

## 2021-11-15 ENCOUNTER — Other Ambulatory Visit: Payer: Self-pay

## 2021-11-15 NOTE — Telephone Encounter (Signed)
Patient's mother is calling, states that patient was unable to get labs at Dr. Doristine Church office, so he is scheduled her on 12/7. Please place order. Patient also states that he was supposed to have a urinalysis. She needs advised on where to get that done. Please call to discuss.

## 2021-11-15 NOTE — Telephone Encounter (Signed)
DPR on file. Called and spoke with the patients mother. Patient will have his bmp repeated here in our office on 11/17/21. Lab ordered for bmp placed.  Pt mother sts that the pt was given an order from nephrology for a urinalysis but she is not sure where to have it done. Adv her that she could contact the pt pcp office to see if it can be done there, also provided the [facility] lab telephone number to see if the may be able to have it done at the medical mall. Patient mother voiced appreciation for the assistance.

## 2021-11-17 ENCOUNTER — Other Ambulatory Visit
Admission: RE | Admit: 2021-11-17 | Discharge: 2021-11-17 | Disposition: A | Discharge status: Home / Self Care | Payer: Self-pay | Attending: Nephrology | Admitting: Nephrology

## 2021-11-17 ENCOUNTER — Other Ambulatory Visit (INDEPENDENT_AMBULATORY_CARE_PROVIDER_SITE_OTHER): Payer: Self-pay

## 2021-11-17 ENCOUNTER — Other Ambulatory Visit: Payer: Self-pay

## 2021-11-17 DIAGNOSIS — N179 Acute kidney failure, unspecified: Secondary | ICD-10-CM | POA: Insufficient documentation

## 2021-11-17 DIAGNOSIS — E1022 Type 1 diabetes mellitus with diabetic chronic kidney disease: Secondary | ICD-10-CM | POA: Insufficient documentation

## 2021-11-17 DIAGNOSIS — I5033 Acute on chronic diastolic (congestive) heart failure: Secondary | ICD-10-CM

## 2021-11-17 DIAGNOSIS — R600 Localized edema: Secondary | ICD-10-CM | POA: Insufficient documentation

## 2021-11-17 DIAGNOSIS — I129 Hypertensive chronic kidney disease with stage 1 through stage 4 chronic kidney disease, or unspecified chronic kidney disease: Secondary | ICD-10-CM | POA: Insufficient documentation

## 2021-11-17 DIAGNOSIS — N182 Chronic kidney disease, stage 2 (mild): Secondary | ICD-10-CM | POA: Insufficient documentation

## 2021-11-17 LAB — RENAL FUNCTION PANEL
Albumin: 3.4 g/dL — ABNORMAL LOW (ref 3.5–5.0)
Anion gap: 6 (ref 5–15)
BUN: 38 mg/dL — ABNORMAL HIGH (ref 6–20)
CO2: 19 mmol/L — ABNORMAL LOW (ref 22–32)
Calcium: 8.8 mg/dL — ABNORMAL LOW (ref 8.9–10.3)
Chloride: 114 mmol/L — ABNORMAL HIGH (ref 98–111)
Creatinine, Ser: 1.55 mg/dL — ABNORMAL HIGH (ref 0.61–1.24)
GFR, Estimated: >60 mL/min (ref >60)
Glucose, Bld: 149 mg/dL — ABNORMAL HIGH (ref 70–99)
Phosphorus: 4.7 mg/dL — ABNORMAL HIGH (ref 2.5–4.6)
Potassium: 3.6 mmol/L (ref 3.5–5.1)
Sodium: 139 mmol/L (ref 135–145)

## 2021-11-18 LAB — BASIC METABOLIC PANEL
BUN/Creatinine Ratio: 18 (ref 9–20)
BUN: 32 mg/dL — ABNORMAL HIGH (ref 6–20)
CO2: 16 mmol/L — ABNORMAL LOW (ref 20–29)
Calcium: 9.1 mg/dL (ref 8.7–10.2)
Chloride: 113 mmol/L — ABNORMAL HIGH (ref 96–106)
Creatinine, Ser: 1.76 mg/dL — ABNORMAL HIGH (ref 0.76–1.27)
Glucose: 141 mg/dL — ABNORMAL HIGH (ref 70–99)
Potassium: 3.9 mmol/L (ref 3.5–5.2)
Sodium: 145 mmol/L — ABNORMAL HIGH (ref 134–144)
eGFR: 53 mL/min/{1.73_m2} — ABNORMAL LOW (ref >59)

## 2021-11-19 ENCOUNTER — Telehealth: Payer: Self-pay

## 2021-11-19 NOTE — Telephone Encounter (Signed)
Able to reach pt regarding his recent lab work, [employer], [employer] had a chance to review their results and advised   "Improved labs/kidney function. Continue with current lasix dose. "  All questions or concerns were address and no additional concerns at this time. Agreeable to continue lasix, will call back for anything further.  Copy mailed to patient as he does not have MyChart

## 2021-11-25 IMAGING — DX DG CHEST 1V PORT
1 series · 1 of 1 positions shown · non-contrast
Comparison: 01/15/2021

CLINICAL DATA: Acute respiratory failure

EXAM:
PORTABLE CHEST 1 VIEW

[chest ap]
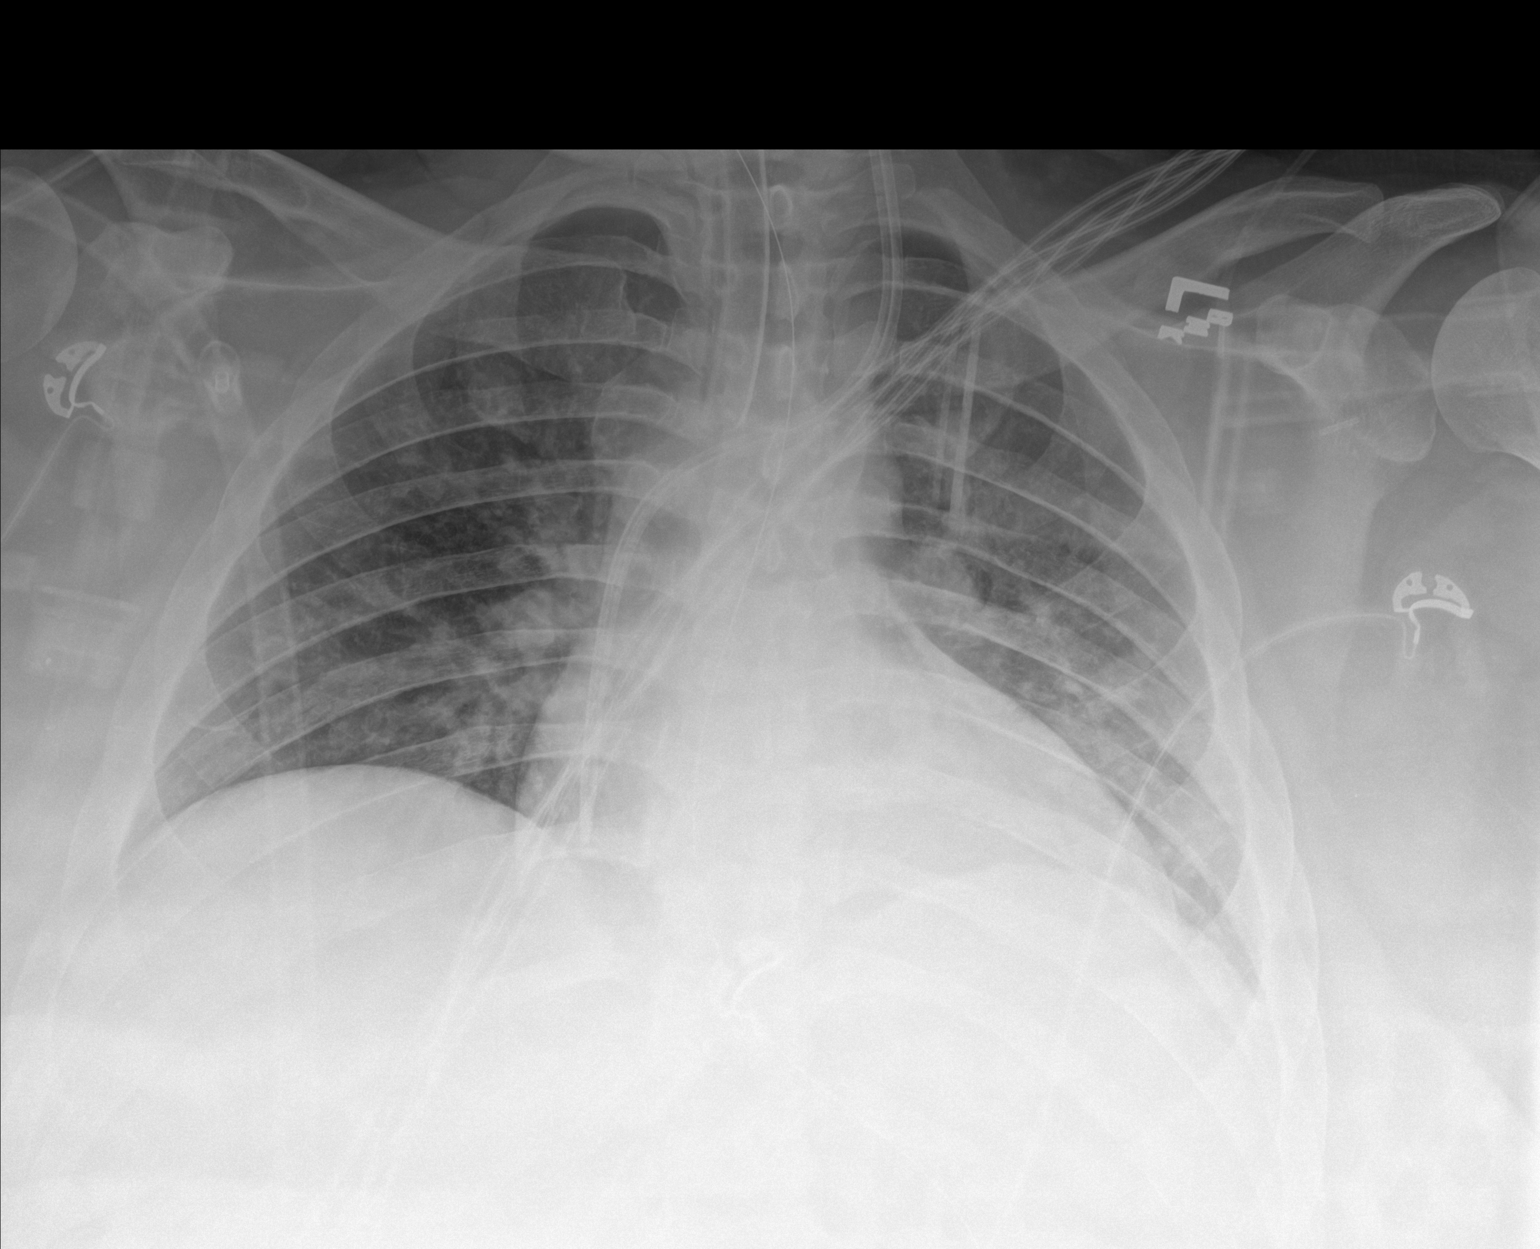

[1 of 1 positions shown; findings below may reference images not displayed]

FINDINGS: The endotracheal tube terminates above the carina. The left-sided
dialysis catheter is well position. The enteric tube terminates
below the left hemidiaphragm. The lung volumes are low. There is no
pneumothorax. There are findings of volume overload/vascular
congestion. There is bibasilar atelectasis with probable small
bilateral pleural effusions.
IMPRESSION: Lines and tubes as above.

Low lung volumes with vascular congestion and bibasilar atelectasis.

## 2021-11-26 ENCOUNTER — Ambulatory Visit (INDEPENDENT_AMBULATORY_CARE_PROVIDER_SITE_OTHER): Payer: Self-pay | Admitting: Nurse Practitioner

## 2021-11-26 ENCOUNTER — Other Ambulatory Visit: Payer: Self-pay

## 2021-11-26 ENCOUNTER — Encounter: Payer: Self-pay | Admitting: Nurse Practitioner

## 2021-11-26 VITALS — BP 150/100 | HR 95 | Ht 70.0 in | Wt >= 6400 oz

## 2021-11-26 DIAGNOSIS — D509 Iron deficiency anemia, unspecified: Secondary | ICD-10-CM

## 2021-11-26 DIAGNOSIS — I10 Essential (primary) hypertension: Secondary | ICD-10-CM

## 2021-11-26 DIAGNOSIS — E11621 Type 2 diabetes mellitus with foot ulcer: Secondary | ICD-10-CM

## 2021-11-26 DIAGNOSIS — N182 Chronic kidney disease, stage 2 (mild): Secondary | ICD-10-CM

## 2021-11-26 DIAGNOSIS — I5032 Chronic diastolic (congestive) heart failure: Secondary | ICD-10-CM

## 2021-11-26 DIAGNOSIS — L97509 Non-pressure chronic ulcer of other part of unspecified foot with unspecified severity: Secondary | ICD-10-CM

## 2021-11-26 DIAGNOSIS — I2510 Atherosclerotic heart disease of native coronary artery without angina pectoris: Secondary | ICD-10-CM

## 2021-11-26 NOTE — Progress Notes (Signed)
Office Visit    Patient Name: Erik [name] Date of Encounter: 11/26/2021  Primary Care Provider:  Pcp, No Primary Cardiologist:  Nelva Bush, MD  Chief Complaint    30 y/o ? w/ a h/o NICM (EF initially 86 to 45% by ventriculogram; 26 to 65% by subsequent echo) complicated by ventricular tachycardia in the setting of COVID-19 with ARDS and acute kidney injury (February 2022), minimal nonobstructive CAD, type 2 diabetes mellitus, asthma, and morbid obesity, who presents for follow-up related to CHF/lower ext edema.  Past Medical History    Past Medical History:  Diagnosis Date   ARDS survivor    a. 01/2021 in setting of COVID PNA.   Asthma    BMI 60.0-69.9, adult ([place])    Chronic heart failure with preserved ejection fraction (HFpEF) ([place])    a. 01/2021 LV gram: EF 35-45%; b. 01/2021 Echo: EF 60-65%; c. 09/2021 Echo: EF 55% (51.8% 2D MOD biplane).   CKD (chronic kidney disease), stage II    a. AKI 01/2021 in setting of COVID.   COVID-19 virus infection 01/2021   Diabetes mellitus without complication (HCC)    Microcytic anemia    Myocarditis ([place])    a. 01/2021 in setting of COVID PNA.   NICM (nonischemic cardiomyopathy) ([place])    a. 01/2021 LV gram: EF 35-45%; b. 01/2021 Echo: EF 60-65%, no rwma, nl RV fxn; c. 09/2021 Echo: EF 55% (51.8% 2D MOD biplane). No rwma, nl RV fxn w/ mild enlargement.   Non-obstructive CAD (coronary artery disease)    a. 01/2021 Cath: LM nl, LAD nl, RI nl, LCX nl, LPAV mild dzs, RCA nl, RPDA min irregs. EF 35-45%.   Super-super obese ([place])    a. BMI 63.71 - 11/2021.   Ventricular tachycardia    a. 01/2021 in setting of myocarditis/COVID.   Past Surgical History:  Procedure Laterality Date   CARDIAC CATHETERIZATION     LEFT HEART CATH AND CORONARY ANGIOGRAPHY N/A 01/20/2021   Procedure: LEFT HEART CATH AND CORONARY ANGIOGRAPHY;  Surgeon: Nelva Bush, MD;  Location: [facility];  Service: Cardiovascular;  Laterality: N/A;     Allergies  No Known Allergies  History of Present Illness    30 year old male with a past medical history including asthma, obesity, and diabetes.  In February 2022, patient was admitted with COVID-19 pneumonia, ARDS, severe acidosis/hypoxia requiring intubation, and acute kidney injury requiring CRRT.  He was treated with remdesivir and steroids.  During hospitalization, he developed ST segment elevation and ventricular tachycardia.  Diagnostic catheterization showed minimal nonobstructive CAD with an EF of 35 to 45% by ventriculography.  Subsequent echocardiogram showed normal LV function at 60 to 65% without regional wall motion abnormalities.  VT was initially treated with amiodarone.  He had a prolonged hospital stay and was subsequently discharged to rehab for nearly a month prior to being rehospitalized in late March due to hypoxic respiratory failure, pneumonia, and sepsis.  He was also anemic during hospitalization and required 1 unit of packed red blood cells.  He underwent repeat echocardiography in October in the setting of lower extremity swelling, which showed an EF of 55% without regional wall motion abnormalities, mild LVH, and no significant valvular disease.  Mr. Frisbie was last seen in cardiology clinic on November 18, at which time he reported feeling well.  Blood pressure remained elevated 150/100 and he was noted to have mild pedal edema.  His torsemide was increased to 40 mg twice daily x3 days.  He was advised to continue carvedilol and hydralazine at current doses (100 mg 3 times daily and 25 mg twice daily respectively).  Following diuresis, his creatinine rose to 2.41 and he was advised to hold torsemide for 3 days and then resume at 40 mg daily.  He was recently seen by nephrology with follow-up creatinine of 1.55 on December 7.  Today, he reports that following escalation of torsemide last month, he did have improvement in lower extremity, though not complete resolution.  He  feels as though swelling has returned some since then.  His weight is down 11 pounds since his last visit.  He does not experience chest pain or dyspnea and denies palpitations, PND, orthopnea, dizziness, syncope, or early satiety.  We discussed his lifestyle and his diet at length today.  He spends much of his day sitting and playing video games.  He is not particularly careful with his sodium intake and frequently eats fast/convenience/processed foods.  Home Medications    Current Outpatient Medications  Medication Sig Dispense Refill   albuterol (VENTOLIN HFA) 108 (90 Base) MCG/ACT inhaler INHALE 1-2 PUFFS INTO THE LUNGS EVERY SIX HOURS AS NEEDED FOR WHEEZING OR SHORTNESS OF BREATH. 18 g 0   aspirin 81 MG chewable tablet Chew 1 tablet (81 mg total) by mouth daily. 100 tablet 0   atorvastatin (LIPITOR) 80 MG tablet Take 1 tablet (80 mg total) by mouth daily. 30 tablet 2   blood glucose meter kit and supplies KIT Dispense based on patient and insurance preference. Use up to four times daily as directed. 1 each 0   Blood Glucose Monitoring Suppl (TRUE METRIX METER) w/Device KIT USE UP TO FOUR TIMES DAILY AS DIRECTED. 1 kit 0   carvedilol (COREG) 25 MG tablet Take 1 tablet (25 mg total) by mouth 2 (two) times daily. 180 tablet 3   clopidogrel (PLAVIX) 75 MG tablet Take 1 tablet (75 mg total) by mouth daily. 90 tablet 3   famotidine (PEPCID) 20 MG tablet TAKE 1 TABLET (20 MG TOTAL) BY MOUTH DAILY. 90 tablet 2   ferrous sulfate 325 (65 FE) MG tablet Take 1 tablet (325 mg total) by mouth daily with breakfast. 90 tablet 3   glucose blood (TRUE METRIX BLOOD GLUCOSE TEST) test strip USE UP TO FOUR TIMES DAILY AS DIRECTED 100 strip 0   hydrALAZINE (APRESOLINE) 100 MG tablet Take 1 tablet (100 mg total) by mouth 3 (three) times daily. 270 tablet 0   insulin aspart (NOVOLOG) 100 UNIT/ML FlexPen Inject 8 Units into the skin 3 (three) times daily with meals. 15 mL 3   insulin detemir (LEVEMIR) 100 UNIT/ML  FlexPen Inject 39 Units into the skin at bedtime. 15 mL 3   Insulin Pen Needle 31G X 6 MM MISC Use as directed with breakfast, with lunch, and with evening meal. 120 each 2   Insulin Pen Needle 32G X 4 MM MISC USE AS DIRECTED WITH BREAKFAST, LUNCH, AND EVENING MEAL. 100 each 0   Multiple Vitamin (MULTIVITAMIN WITH MINERALS) TABS tablet Take 1 tablet by mouth daily.     potassium chloride (KLOR-CON) 10 MEQ tablet Take 1 tablet (10 mEq total) by mouth 2 (two) times daily. 180 tablet 2   torsemide (DEMADEX) 20 MG tablet Take 2 tablets (40 mg total) by mouth daily. 60 tablet 3   TRUEplus Lancets 28G MISC USE UP TO FOUR TIMES DAILY AS DIRECTED 100 each 2   No current facility-administered medications for this visit.  Review of Systems    Ongoing lower extremity swelling.  He denies chest pain, palpitations, dyspnea, PND, orthopnea, dizziness, syncope, or early satiety.  All other systems reviewed and are otherwise negative except as noted above.    Physical Exam    VS:  BP (!) 150/100 (BP Location: Left Arm, Patient Position: Sitting, Cuff Size: Large)    Pulse 95    Ht '5\' 10"'  (1.778 m)    Wt (!) 444 lb (201.4 kg)    SpO2 93%    BMI 63.71 kg/m  , BMI Body mass index is 63.71 kg/m.     GEN: Morbidly obese, in no acute distress. HEENT: normal. Neck: Supple, obese, difficult to gauge JVP.  No carotid bruits or masses. Cardiac: RRR, no murmurs, rubs, or gallops. No clubbing, cyanosis, 2+ bilateral lower extremity edema to the knees.  Radials 2+/PT 1+ and equal bilaterally.  Respiratory:  Respirations regular and unlabored, clear to auscultation bilaterally. GI: Obese, soft, nontender, nondistended, BS + x 4. MS: no deformity or atrophy. Skin: warm and dry, no rash. Neuro:  Strength and sensation are intact. Psych: Normal affect.  Accessory Clinical Findings    ECG personally reviewed by me today -regular sinus rhythm, 95, left axis deviation- no acute changes.  Lab Results  Component  Value Date   WBC 7.8 05/11/2021   HGB 8.0 (L) 05/11/2021   HCT 27.2 (L) 05/11/2021   MCV 79.1 (L) 05/11/2021   PLT 344 05/11/2021   Lab Results  Component Value Date   CREATININE 1.55 (H) 11/17/2021   BUN 38 (H) 11/17/2021   NA 139 11/17/2021   K 3.6 11/17/2021   CL 114 (H) 11/17/2021   CO2 19 (L) 11/17/2021   Lab Results  Component Value Date   ALT 12 04/22/2021   AST 10 04/22/2021   ALKPHOS 124 (H) 04/22/2021   BILITOT 0.3 04/22/2021   Lab Results  Component Value Date   CHOL 165 01/27/2021   HDL 54 01/27/2021   LDLCALC 65 01/27/2021   TRIG 230 (H) 01/27/2021   CHOLHDL 3.1 01/27/2021    Lab Results  Component Value Date   HGBA1C 6.6 04/22/2021    Assessment & Plan    1.  Chronic heart failure with preserved ejection fraction/nonischemic cardiomyopathy: Hospitalized in February 2022 with COVID-pneumonia complicated by ARDS, AKI, myocarditis, and ventricular tachycardia.  Cath showed minimal nonobstructive disease while EF was initially recorded at 35 to 45% by ventriculography.  Subsequent echo in February 2022 showed EF 60 to 65% and 55% in October 2022.  At last visit, he had lower extremity swelling and he was advised to increase his torsemide for 3 days.  This resulted in a rise in creatinine to 2.41 and subsequent holding of torsemide for a few days.  He is currently taking 40 mg daily with recent lab work through the nephrology office showing a creatinine of 1.55 on December 7.  His weight is down 11 pounds since his last visit though he does have 2+ bilateral lower extremity edema to his knees.  He has not been experiencing any dyspnea or orthopnea.  We had a long discussion about his diet and lifestyle.  Lots of fast food, processed food, sodium, with little activity.  I am not going to increase his torsemide dose today and instead have recommended that he keep his legs elevated and use compression while beginning to dramatically change his diet with a focus on whole  food/plant-based choices.  He is hypertensive on  maximum dose carvedilol and hydralazine.  He reports compliance.  With chronic kidney disease and variable creatinine, I am reluctant to add acei/arb/arni/mra at this time though this may be a consideration if renal function stabilizes in the future.  With his lower extremity swelling, I think he is a poor candidate for amlodipine.  I am going to add isosorbide mononitrate 30 mg daily.  2.  Essential hypertension: As above, blood pressure remains elevated despite compliance.  Continue current doses of carvedilol and hydralazine.  With lower extremity swelling, he is a poor candidate for amlodipine.  Poor candidate for acei/arb/arni/mra in the setting of chronic kidney disease with recent elevation in creatinine, though now improved.  Adding isosorbide mononitrate 30 mg daily.  3.  Stage II chronic kidney disease: Followed by nephrology.  Had labs on December 7 with a creatinine of 1.55.  As above, if renal function shows itself to be stable over time, would benefit from addition of ARB.  4.  Nonobstructive CAD: Status post catheterization in the setting of VT and myocarditis in February 2022 with minimal, nonobstructive branch disease.  He has not had any chest pain or dyspnea.  He remains on aspirin, statin, beta-blocker, and Plavix.  5.  History of myocarditis/ventricular tachycardia: In the setting of COVID-19 infection in February 2022.  6.  Type 2 diabetes mellitus: A1c was 6.6 in May.  He is on insulin therapy managed by primary care.  As above, if renal function stabilizes, and not cost prohibitive, SGLT2 inhibitor would be indicated.  7.  Morbid obesity/BMI>60: Long discussion today about his lifestyle and dietary choices.  Strongly encouraged that he consider a whole food/plant-based diet with plan to lose 2 pounds a week over the next year.  He indicates motivation.  8.  Microcytic anemia: Labs in May showed an H&H of 8.0 and 27.2.  MCV 79.1.   Asymptomatic.  No history of bleeding.  Followed by primary care.  9.  Disposition: Follow-up in clinic in 6 weeks or sooner if necessary.  Murray Hodgkins, NP 11/26/2021, 5:21 PM

## 2021-11-26 NOTE — Patient Instructions (Signed)
Medication Instructions:  Your physician has recommended you make the following change in your medication:   START Isosorbide mononitrate 30 mg once a day  *If you need a refill on your cardiac medications before your next appointment, please call your pharmacy*   Lab Work: None  If you have labs (blood work) drawn today and your tests are completely normal, you will receive your results only by: MyChart Message (if you have MyChart) OR A paper copy in the mail If you have any lab test that is abnormal or we need to change your treatment, we will call you to review the results.   Testing/Procedures: None   Follow-Up: At [facility], you and your health needs are our priority.  As part of our continuing mission to provide you with exceptional heart care, we have created designated Provider Care Teams.  These Care Teams include your primary Cardiologist (physician) and Advanced Practice Providers (APPs -  Physician Assistants and Nurse Practitioners) who all work together to provide you with the care you need, when you need it.  We recommend signing up for the patient portal called "MyChart".  Sign up information is provided on this After Visit Summary.  MyChart is used to connect with patients for Virtual Visits (Telemedicine).  Patients are able to view lab/test results, encounter notes, upcoming appointments, etc.  Non-urgent messages can be sent to your provider as well.   To learn more about what you can do with MyChart, go to [email or web address].    Your next appointment:   6-8 week(s)  The format for your next appointment:   In Person  Provider:   Yvonne Kendall, MD or Cadence Fransico Michael, [place]

## 2021-11-27 IMAGING — DX DG ABDOMEN 1V
4 series · 4 of 4 positions shown · non-contrast
Comparison: 01/19/2021 and earlier.

CLINICAL DATA: 29-year-old male positive [id number]. Decreased bowel
sounds.

EXAM:
ABDOMEN - 1 VIEW

[abdomen supine (1 of 4)]
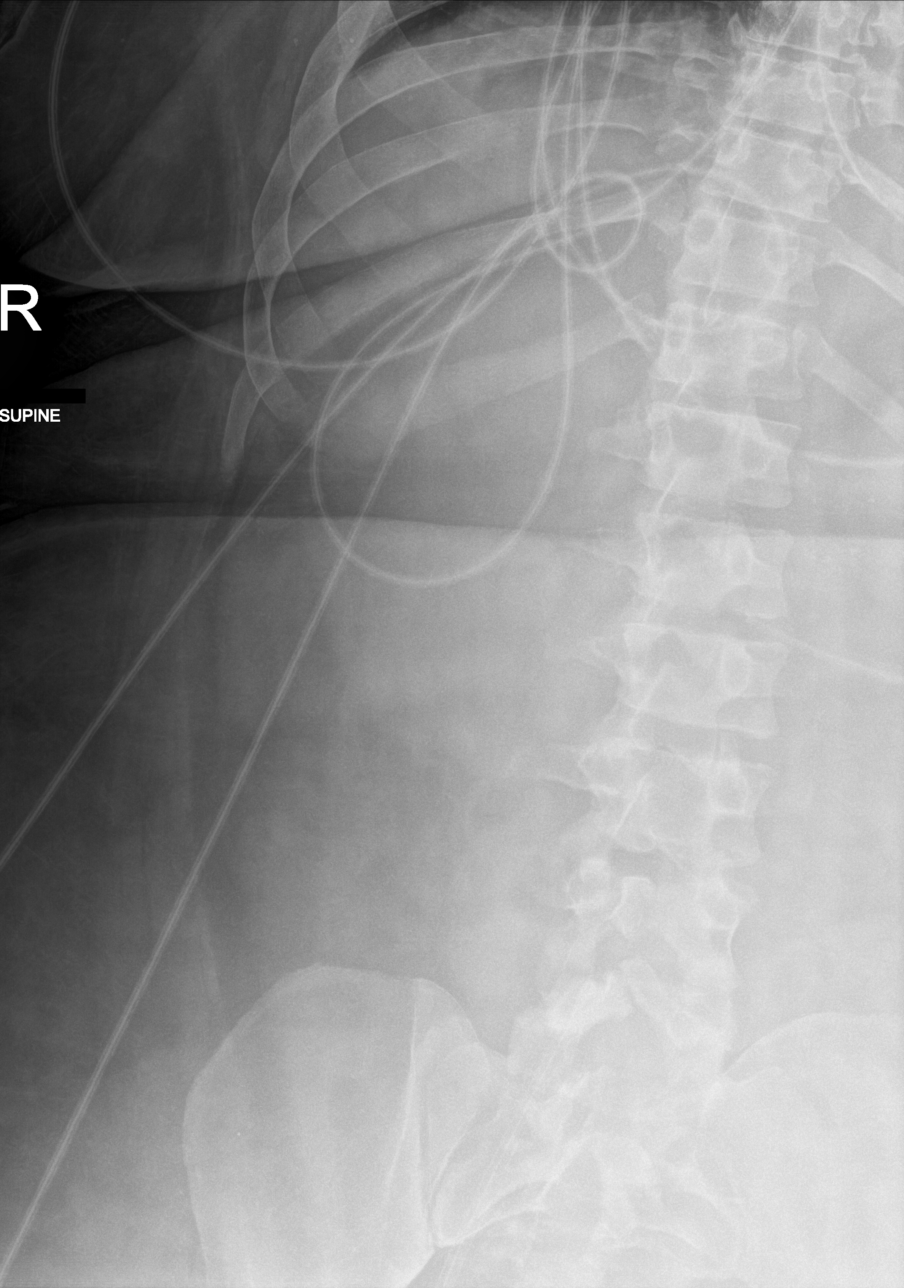

[abdomen supine (2 of 4)]
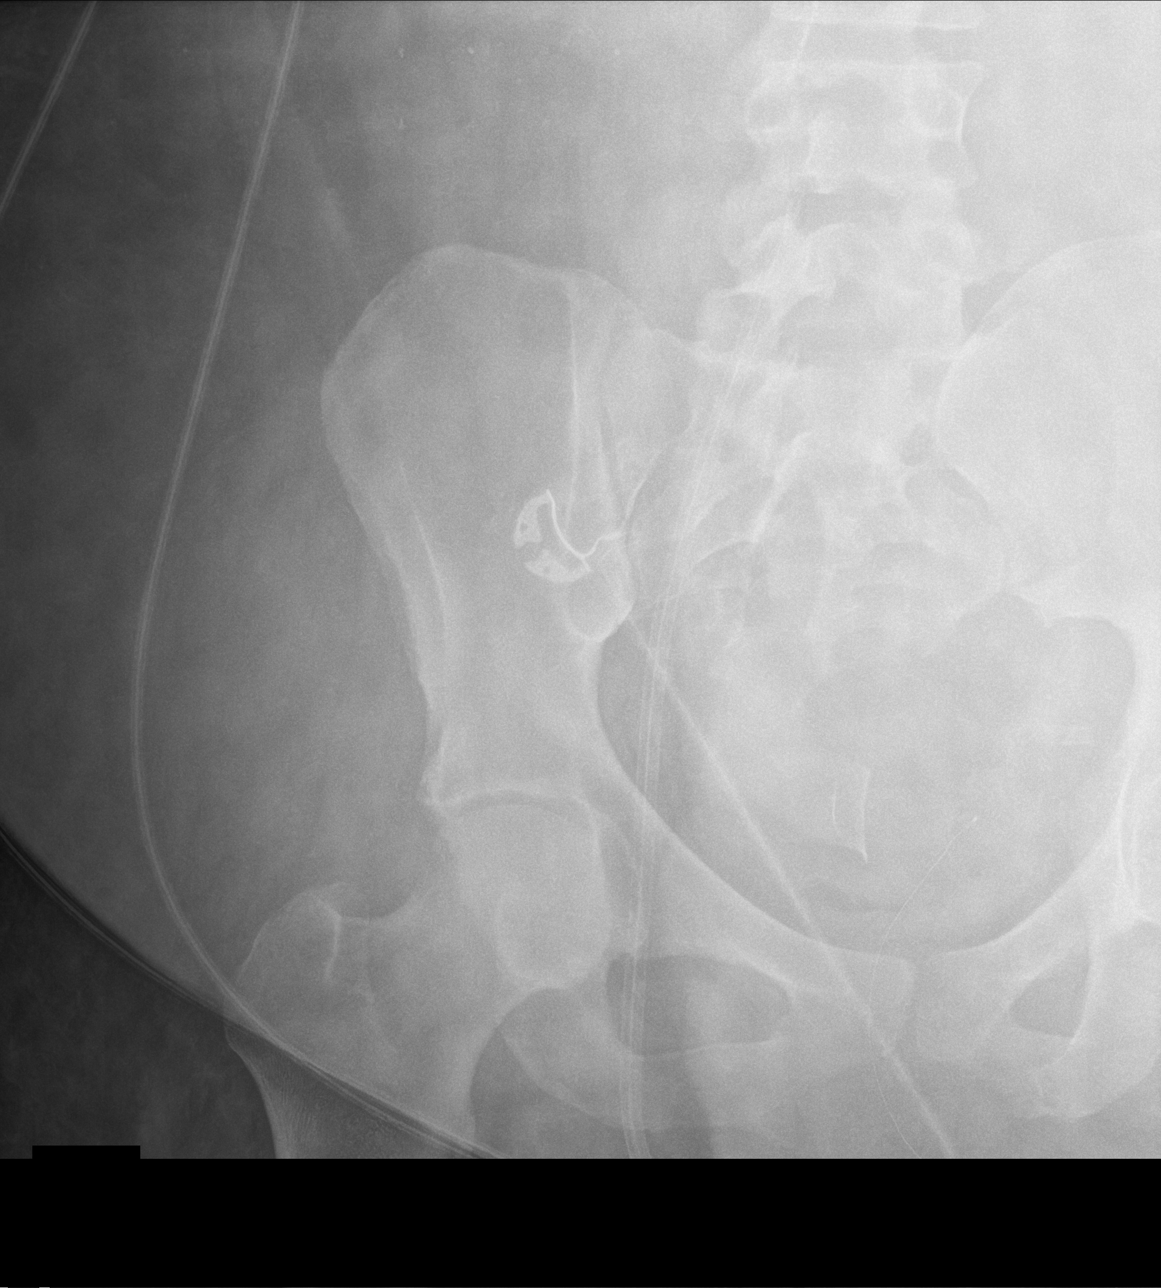

[abdomen supine (3 of 4)]
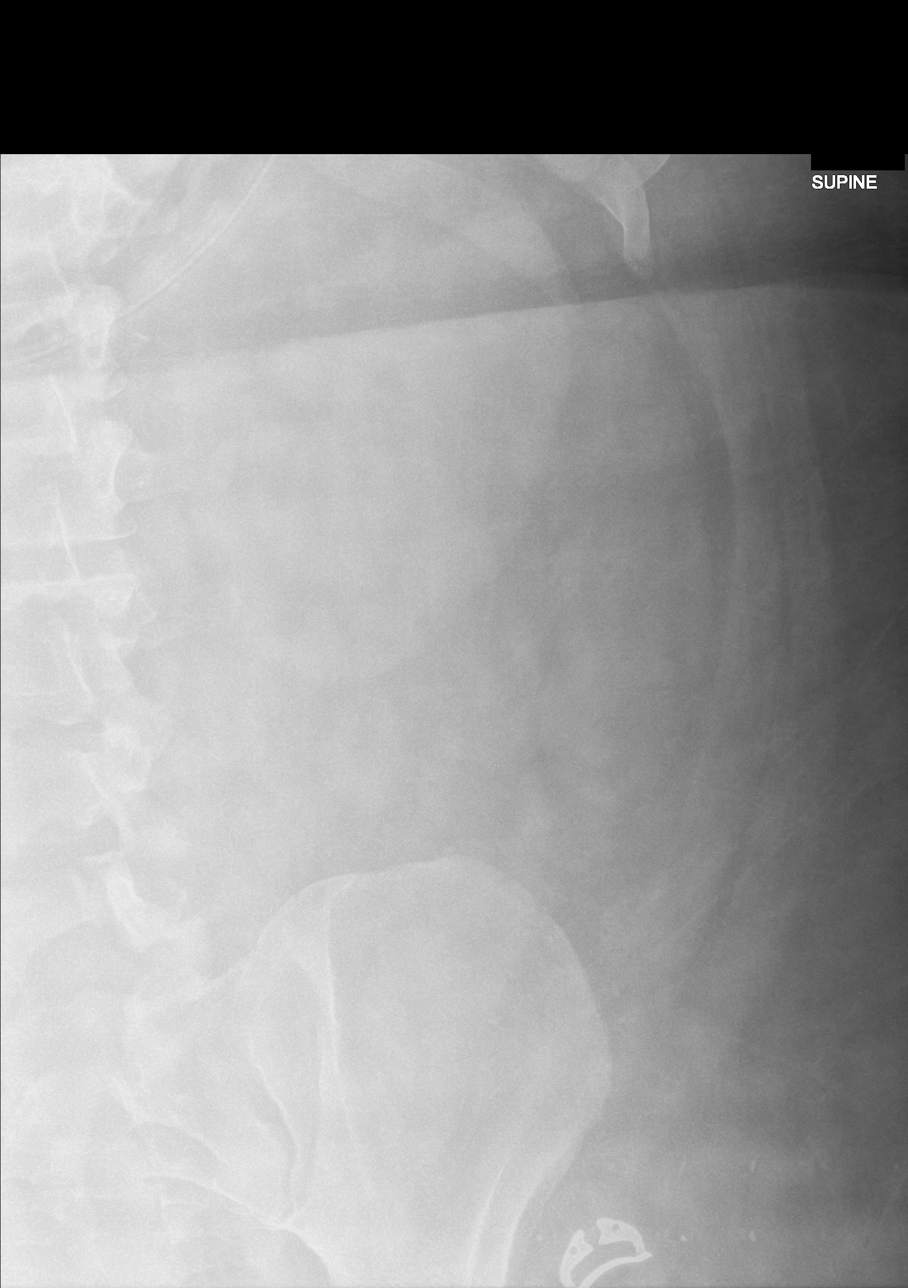

[abdomen supine (4 of 4)]
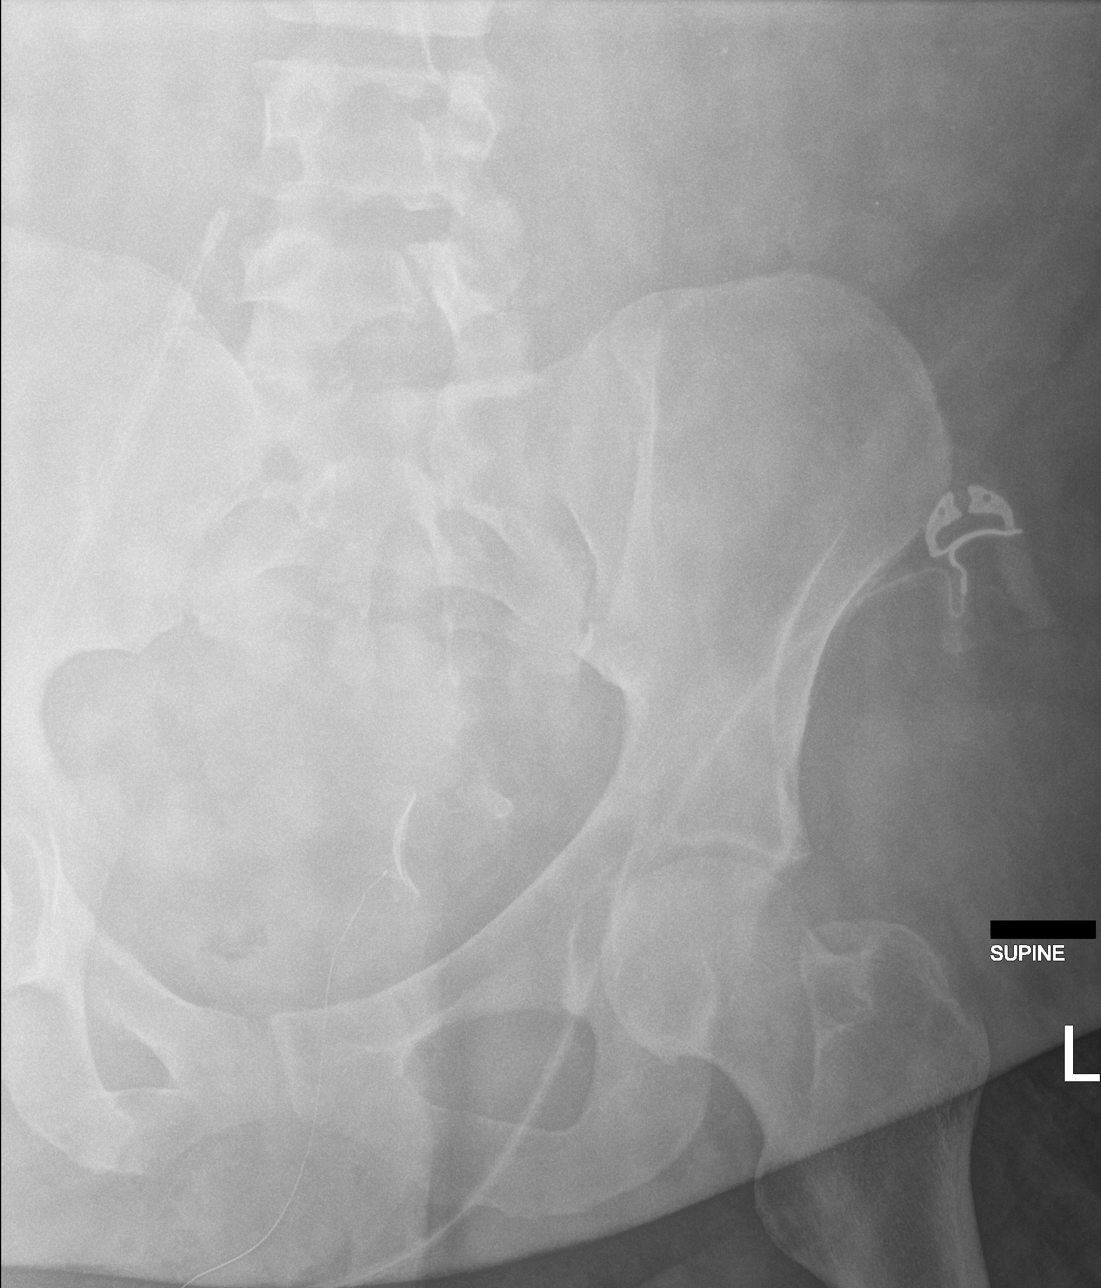

[4 of 4 positions shown; findings below may reference images not displayed]

FINDINGS: Portable AP supine view at 1050 hours. Stable enteric tube, tip at
the distal stomach. Right femoral approach catheter appears stable.

Continued largely absent bowel gas throughout the abdomen and
pelvis. Trace gas in the region of the rectum. No dilated bowel
loops are evident. Stable visualized osseous structures.
IMPRESSION: 1. Stable enteric tube and right femoral catheter.
2. Continued virtual absence of bowel gas throughout the abdomen and
pelvis.
No dilated loops are evident, although if there is new abdominal
distension then recommend follow-up CT Abdomen and Pelvis as
fluid-filled abnormal bowel is difficult to exclude.

## 2021-11-29 ENCOUNTER — Other Ambulatory Visit: Payer: Self-pay

## 2021-11-30 ENCOUNTER — Other Ambulatory Visit: Payer: Self-pay

## 2021-12-20 ENCOUNTER — Other Ambulatory Visit
Admission: RE | Admit: 2021-12-20 | Discharge: 2021-12-20 | Disposition: A | Discharge status: Home / Self Care | Payer: Self-pay | Attending: Nephrology | Admitting: Nephrology

## 2021-12-20 DIAGNOSIS — I129 Hypertensive chronic kidney disease with stage 1 through stage 4 chronic kidney disease, or unspecified chronic kidney disease: Secondary | ICD-10-CM | POA: Insufficient documentation

## 2021-12-20 DIAGNOSIS — N182 Chronic kidney disease, stage 2 (mild): Secondary | ICD-10-CM | POA: Insufficient documentation

## 2021-12-20 DIAGNOSIS — E1022 Type 1 diabetes mellitus with diabetic chronic kidney disease: Secondary | ICD-10-CM | POA: Insufficient documentation

## 2021-12-20 DIAGNOSIS — N179 Acute kidney failure, unspecified: Secondary | ICD-10-CM | POA: Insufficient documentation

## 2021-12-20 DIAGNOSIS — R600 Localized edema: Secondary | ICD-10-CM | POA: Insufficient documentation

## 2021-12-20 LAB — RENAL FUNCTION PANEL
Albumin: 2.9 g/dL — ABNORMAL LOW (ref 3.5–5.0)
Anion gap: 8 (ref 5–15)
BUN: 26 mg/dL — ABNORMAL HIGH (ref 6–20)
CO2: 25 mmol/L (ref 22–32)
Calcium: 8.5 mg/dL — ABNORMAL LOW (ref 8.9–10.3)
Chloride: 106 mmol/L (ref 98–111)
Creatinine, Ser: 2.06 mg/dL — ABNORMAL HIGH (ref 0.61–1.24)
GFR, Estimated: 44 mL/min — ABNORMAL LOW (ref >60)
Glucose, Bld: 327 mg/dL — ABNORMAL HIGH (ref 70–99)
Phosphorus: 3.9 mg/dL (ref 2.5–4.6)
Potassium: 4.1 mmol/L (ref 3.5–5.1)
Sodium: 139 mmol/L (ref 135–145)

## 2022-01-12 NOTE — Progress Notes (Deleted)
Follow-up Outpatient Visit Date: 01/14/2022  Primary Care Provider: [facility], [facility] [address] [place] [address]  Chief Complaint: ***  HPI:  Erik [name] is a 31 y.o. male with history of nonischemic cardiomyopathy (LVEF initially 35-45% by left ventriculogram, 60-65% on subsequent echocardiogram) complicated by ventricular tachycardia in the setting of COVID-19 with ARDS and acute kidney injury, type 2 diabetes mellitus, and morbid obesity, who presents for follow-up of cardiomyopathy and ventricular tachycardia.  He was last seen in our office in mid December by Ward Givens, NP, at which time he reported some improvement in leg edema after escalation of torsemide.  However, worsening renal insufficiency was noted with more aggressive diuresis.  Decision was made to add isosorbide mononitrate for blood pressure control.  --------------------------------------------------------------------------------------------------  Past Medical History:  Diagnosis Date   ARDS survivor    a. 01/2021 in setting of COVID PNA.   Asthma    BMI 60.0-69.9, adult (HCC)    Chronic heart failure with preserved ejection fraction (HFpEF) ([facility])    a. 01/2021 LV gram: EF 35-45%; b. 01/2021 Echo: EF 60-65%; c. 09/2021 Echo: EF 55% (51.8% 2D MOD biplane).   CKD (chronic kidney disease), stage II    a. AKI 01/2021 in setting of COVID.   COVID-19 virus infection 01/2021   Diabetes mellitus without complication (HCC)    Microcytic anemia    Myocarditis ([facility])    a. 01/2021 in setting of COVID PNA.   NICM (nonischemic cardiomyopathy) ([facility])    a. 01/2021 LV gram: EF 35-45%; b. 01/2021 Echo: EF 60-65%, no rwma, nl RV fxn; c. 09/2021 Echo: EF 55% (51.8% 2D MOD biplane). No rwma, nl RV fxn w/ mild enlargement.   Non-obstructive CAD (coronary artery disease)    a. 01/2021 Cath: LM nl, LAD nl, RI nl, LCX nl, LPAV mild dzs, RCA nl, RPDA min irregs. EF 35-45%.   Super-super obese ([facility]CC)    a. BMI 63.71 -  11/2021.   Ventricular tachycardia    a. 01/2021 in setting of myocarditis/COVID.   Past Surgical History:  Procedure Laterality Date   CARDIAC CATHETERIZATION     LEFT HEART CATH AND CORONARY ANGIOGRAPHY N/A 01/20/2021   Procedure: LEFT HEART CATH AND CORONARY ANGIOGRAPHY;  Surgeon: Yvonne Kendall, MD;  Location: [facility];  Service: Cardiovascular;  Laterality: N/A;    No outpatient medications have been marked as taking for the 01/14/22 encounter (Appointment) with Lindberg Zenon, Erik Deer, MD.    Allergies: Patient has no known allergies.  Social History   Tobacco Use   Smoking status: Never   Smokeless tobacco: Never  Vaping Use   Vaping Use: Never used  Substance Use Topics   Alcohol use: Never   Drug use: Never    Family History  Problem Relation Age of Onset   Hypertension Mother    Kidney disease Mother    Heart Problems Maternal Grandmother    Heart attack Maternal Grandmother    Heart disease Maternal Grandmother     Review of Systems: A 12-system review of systems was performed and was negative except as noted in the HPI.  --------------------------------------------------------------------------------------------------  Physical Exam: There were no vitals taken for this visit.  General:  NAD. Neck: No JVD or HJR. Lungs: Clear to auscultation bilaterally without wheezes or crackles. Heart: Regular rate and rhythm without murmurs, rubs, or gallops. Abdomen: Soft, nontender, nondistended. Extremities: No lower extremity edema.  EKG:  ***  Lab Results  Component Value Date  WBC 7.8 05/11/2021   HGB 8.0 (L) 05/11/2021   HCT 27.2 (L) 05/11/2021   MCV 79.1 (L) 05/11/2021   PLT 344 05/11/2021    Lab Results  Component Value Date   NA 139 12/20/2021   K 4.1 12/20/2021   CL 106 12/20/2021   CO2 25 12/20/2021   BUN 26 (H) 12/20/2021   CREATININE 2.06 (H) 12/20/2021   GLUCOSE 327 (H) 12/20/2021   ALT 12 04/22/2021    Lab Results  Component  Value Date   CHOL 165 01/27/2021   HDL 54 01/27/2021   LDLCALC 65 01/27/2021   TRIG 230 (H) 01/27/2021   CHOLHDL 3.1 01/27/2021    --------------------------------------------------------------------------------------------------  ASSESSMENT AND PLAN: Erik Deer Kaybree Williams, MD 01/12/2022 1:31 PM

## 2022-01-14 ENCOUNTER — Ambulatory Visit: Payer: Self-pay | Admitting: Internal Medicine

## 2022-01-17 ENCOUNTER — Other Ambulatory Visit: Payer: Self-pay

## 2022-01-17 ENCOUNTER — Other Ambulatory Visit: Payer: Self-pay | Admitting: Internal Medicine

## 2022-01-17 MED ORDER — TORSEMIDE 20 MG PO TABS
40.0000 mg | ORAL_TABLET | Freq: Every day | ORAL | 0 refills | Status: DC
  Filled 2022-01-17 (×2): qty 60, 30d supply, fill #0

## 2022-01-18 ENCOUNTER — Other Ambulatory Visit: Payer: Self-pay

## 2022-02-10 ENCOUNTER — Other Ambulatory Visit: Payer: Self-pay

## 2022-02-11 ENCOUNTER — Other Ambulatory Visit: Payer: Self-pay | Admitting: Physician Assistant

## 2022-02-11 ENCOUNTER — Other Ambulatory Visit: Payer: Self-pay

## 2022-02-11 DIAGNOSIS — E119 Type 2 diabetes mellitus without complications: Secondary | ICD-10-CM

## 2022-02-14 ENCOUNTER — Other Ambulatory Visit: Payer: Self-pay

## 2022-02-23 ENCOUNTER — Other Ambulatory Visit: Payer: Self-pay | Admitting: Internal Medicine

## 2022-02-23 ENCOUNTER — Other Ambulatory Visit: Payer: Self-pay

## 2022-02-23 MED ORDER — TORSEMIDE 20 MG PO TABS
40.0000 mg | ORAL_TABLET | Freq: Every day | ORAL | 0 refills | Status: DC
  Filled 2022-02-23: qty 60, 30d supply, fill #0

## 2022-02-24 ENCOUNTER — Other Ambulatory Visit: Payer: Self-pay

## 2022-02-25 ENCOUNTER — Ambulatory Visit: Payer: Self-pay | Admitting: Medical

## 2022-03-03 ENCOUNTER — Other Ambulatory Visit: Payer: Self-pay

## 2022-03-03 ENCOUNTER — Encounter: Payer: Self-pay | Admitting: Medical

## 2022-03-03 ENCOUNTER — Ambulatory Visit (INDEPENDENT_AMBULATORY_CARE_PROVIDER_SITE_OTHER): Payer: Self-pay | Admitting: Medical

## 2022-03-03 VITALS — BP 160/110 | HR 95 | Ht 70.0 in | Wt >= 6400 oz

## 2022-03-03 DIAGNOSIS — N189 Chronic kidney disease, unspecified: Secondary | ICD-10-CM

## 2022-03-03 DIAGNOSIS — E11621 Type 2 diabetes mellitus with foot ulcer: Secondary | ICD-10-CM

## 2022-03-03 DIAGNOSIS — L97509 Non-pressure chronic ulcer of other part of unspecified foot with unspecified severity: Secondary | ICD-10-CM

## 2022-03-03 DIAGNOSIS — R600 Localized edema: Secondary | ICD-10-CM

## 2022-03-03 DIAGNOSIS — I5032 Chronic diastolic (congestive) heart failure: Secondary | ICD-10-CM

## 2022-03-03 MED ORDER — ISOSORBIDE MONONITRATE ER 30 MG PO TB24
30.0000 mg | ORAL_TABLET | Freq: Every day | ORAL | 3 refills | Status: DC
  Filled 2022-03-03: qty 90, 90d supply, fill #0
  Filled 2022-03-15: qty 30, 30d supply, fill #0
  Filled 2022-04-28: qty 30, 30d supply, fill #1

## 2022-03-03 NOTE — Progress Notes (Signed)
?Cardiology Office Note:   ? ?Date:  03/03/2022  ? ?ID:  Erik [name], DOB 10-13-1991, MRN [id number] ? ?PCP:  [facility], [facility]  ?[facility] Cardiologist:  Nelva Bush, MD  ?[facility] Electrophysiologist:  None  ? ?Referring MD: [facility], [place] ?Chief Complaint: 6-8 week follow-up ? ?History of Present Illness:   ? ?Erik [name] is a 31 y.o. male with a hx of NICM (EF initially 35 to 45% by ventriculogram; 70 to 65% by subsequent echo) complicated by ventricular tachycardia in the setting of COVID-19 with ARDS and acute kidney injury (February 2022), minimal nonobstructive CAD, type 2 diabetes mellitus, asthma, and morbid obesity, who presents for follow-up related to CHF/lower ext edema. ? ?In February 2022, patient was admitted with COVID-19 pneumonia, ARDS, severe acidosis/hypoxia requiring intubation, and acute kidney injury requiring CRRT.  He was treated with remdesivir and steroids.  During hospitalization, he developed ST segment elevation and ventricular tachycardia.  Diagnostic catheterization showed minimal nonobstructive CAD with an EF of 35 to 45% by ventriculography.  Subsequent echocardiogram showed normal LV function at 60 to 65% without regional wall motion abnormalities.  VT was initially treated with amiodarone.  He had a prolonged hospital stay and was subsequently discharged to rehab for nearly a month prior to being rehospitalized in late March due to hypoxic respiratory failure, pneumonia, and sepsis.  He was also anemic during hospitalization and required 1 unit of packed red blood cells.  He underwent repeat echocardiography in October in the setting of lower extremity swelling, which showed an EF of 55% without regional wall motion abnormalities, mild LVH, and no significant valvular disease. ? ?Has been seen more recently for LLE. He had worsening kidney function with high dose of Torsemide 59m daily.  ? ?Last seen 11/26/21 and LLE noted on exam  suspected from food/lifestyle choices. BP was still elevated and he was started on Imdur.  ? ?Today, the patient reports persistent intermittent LLE and dyspnea on exertion. Pateint reports swelling is up and down. Says elevating the legs improves swelling. Has not been walking. He eats out 3-4 times a week. Eats soup at home. BP is high. Apparently has not started Imdur.  ? ?Past Medical History:  ?Diagnosis Date  ? ARDS survivor   ? a. 01/2021 in setting of COVID PNA.  ? Asthma   ? BMI 60.0-69.9, adult ([place]   ? Chronic heart failure with preserved ejection fraction (HFpEF) ([place]   ? a. 01/2021 LV gram: EF 35-45%; b. 01/2021 Echo: EF 60-65%; c. 09/2021 Echo: EF 55% (51.8% 2D MOD biplane).  ? CKD (chronic kidney disease), stage II   ? a. AKI 01/2021 in setting of COVID.  ? COVID-19 virus infection 01/2021  ? Diabetes mellitus without complication ([place]   ? Microcytic anemia   ? Myocarditis ([place]   ? a. 01/2021 in setting of COVID PNA.  ? NICM (nonischemic cardiomyopathy) ([place]   ? a. 01/2021 LV gram: EF 35-45%; b. 01/2021 Echo: EF 60-65%, no rwma, nl RV fxn; c. 09/2021 Echo: EF 55% (51.8% 2D MOD biplane). No rwma, nl RV fxn w/ mild enlargement.  ? Non-obstructive CAD (coronary artery disease)   ? a. 01/2021 Cath: LM nl, LAD nl, RI nl, LCX nl, LPAV mild dzs, RCA nl, RPDA min irregs. EF 35-45%.  ? Super-super obese ([place]   ? a. BMI 63.71 - 11/2021.  ? Ventricular tachycardia   ? a. 01/2021 in setting of myocarditis/COVID.  ? ? ?Past  Surgical History:  ?Procedure Laterality Date  ? CARDIAC CATHETERIZATION    ? LEFT HEART CATH AND CORONARY ANGIOGRAPHY N/A 01/20/2021  ? Procedure: LEFT HEART CATH AND CORONARY ANGIOGRAPHY;  Surgeon: Nelva Bush, MD;  Location: [place] [facility];  Service: Cardiovascular;  Laterality: N/A;  ? ? ?Current Medications: ?Current Meds  ?Medication Sig  ? albuterol (VENTOLIN HFA) 108 (90 Base) MCG/ACT inhaler INHALE 1-2 PUFFS INTO THE LUNGS EVERY SIX HOURS AS NEEDED FOR WHEEZING OR SHORTNESS OF  BREATH.  ? aspirin 81 MG chewable tablet Chew 1 tablet (81 mg total) by mouth daily.  ? atorvastatin (LIPITOR) 80 MG tablet Take 1 tablet (80 mg total) by mouth daily.  ? blood glucose meter kit and supplies KIT Dispense based on patient and insurance preference. Use up to four times daily as directed.  ? carvedilol (COREG) 25 MG tablet Take 1 tablet (25 mg total) by mouth 2 (two) times daily.  ? clopidogrel (PLAVIX) 75 MG tablet Take 1 tablet (75 mg total) by mouth daily.  ? famotidine (PEPCID) 20 MG tablet TAKE 1 TABLET (20 MG TOTAL) BY MOUTH DAILY.  ? ferrous sulfate 325 (65 FE) MG tablet Take 1 tablet (325 mg total) by mouth daily with breakfast.  ? glucose blood (TRUE METRIX BLOOD GLUCOSE TEST) test strip USE UP TO FOUR TIMES DAILY AS DIRECTED  ? hydrALAZINE (APRESOLINE) 100 MG tablet Take 1 tablet (100 mg total) by mouth 3 (three) times daily.  ? insulin aspart (NOVOLOG) 100 UNIT/ML FlexPen Inject 8 Units into the skin 3 (three) times daily with meals.  ? insulin detemir (LEVEMIR) 100 UNIT/ML FlexPen Inject 39 Units into the skin at bedtime.  ? Insulin Pen Needle 31G X 6 MM MISC Use as directed with breakfast, with lunch, and with evening meal.  ? Insulin Pen Needle 32G X 4 MM MISC USE AS DIRECTED WITH BREAKFAST, LUNCH, AND EVENING MEAL.  ? isosorbide mononitrate (IMDUR) 30 MG 24 hr tablet Take 1 tablet (30 mg total) by mouth daily.  ? Multiple Vitamin (MULTIVITAMIN WITH MINERALS) TABS tablet Take 1 tablet by mouth daily.  ? potassium chloride (KLOR-CON) 10 MEQ tablet Take 1 tablet (10 mEq total) by mouth 2 (two) times daily.  ? torsemide (DEMADEX) 20 MG tablet Take 2 tablets (40 mg total) by mouth daily.  ? TRUEplus Lancets 28G MISC USE UP TO FOUR TIMES DAILY AS DIRECTED  ?  ? ?Allergies:   Patient has no known allergies.  ? ?Social History  ? ?Socioeconomic History  ? Marital status: Single  ?  Spouse name: Not on file  ? Number of children: Not on file  ? Years of education: Not on file  ? Highest education  level: Not on file  ?Occupational History  ? Not on file  ?Tobacco Use  ? Smoking status: Never  ? Smokeless tobacco: Never  ?Vaping Use  ? Vaping Use: Never used  ?Substance and Sexual Activity  ? Alcohol use: Never  ? Drug use: Never  ? Sexual activity: Never  ?Other Topics Concern  ? Not on file  ?Social History Narrative  ? Not on file  ? ?Social Determinants of Health  ? ?Financial Resource Strain: Not on file  ?Food Insecurity: Not on file  ?Transportation Needs: Not on file  ?Physical Activity: Not on file  ?Stress: Not on file  ?Social Connections: Not on file  ?  ? ?Family History: ?The patient's family history includes Heart Problems in his maternal grandmother; Heart attack in  his maternal grandmother; Heart disease in his maternal grandmother; Hypertension in his mother; Kidney disease in his mother. ? ?ROS:   ?Please see the history of present illness.    ? All other systems reviewed and are negative. ? ?EKGs/Labs/Other Studies Reviewed:   ? ?The following studies were reviewed today: ? ?Echo 09/2021 ? ? 1. Left ventricular ejection fraction, by estimation, is 55%. Left  ?ventricular ejection fraction by 2D MOD biplane is 51.8 %. The left  ?ventricle has low normal function. The left ventricle has no regional wall  ?motion abnormalities. There is mild left  ?ventricular hypertrophy. Left ventricular diastolic parameters are  ?indeterminate.  ? 2. Right ventricular systolic function is normal. The right ventricular  ?size is mildly enlarged.  ? 3. The mitral valve is normal in structure. No evidence of mitral valve  ?regurgitation.  ? 4. The aortic valve was not well visualized. Aortic valve regurgitation  ?is not visualized.  ? 5. The inferior vena cava is dilated in size with >50% respiratory  ?variability, suggesting right atrial pressure of 8 mmHg.  ? ?Echo 01/20/21 ? 1. Left ventricular ejection fraction, by estimation, is 60 to 65%. The  ?left ventricle has normal function. The left ventricle has no  regional  ?wall motion abnormalities. Left ventricular diastolic parameters were  ?normal.  ? 2. Right ventricular systolic function is normal. The right ventricular  ?size is normal.  ? 3. The mitral valve

## 2022-03-03 NOTE — Patient Instructions (Signed)
Medication Instructions:  ?Your physician has recommended you make the following change in your medication:  ? ?START taking isosorbide mononitrate (Imdur) 30 mg daily  ? ?*If you need a refill on your cardiac medications before your next appointment, please call your pharmacy* ? ? ?Lab Work: ?None ordered ? ?If you have labs (blood work) drawn today and your tests are completely normal, you will receive your results only by: ?MyChart Message (if you have MyChart) OR ?A paper copy in the mail ?If you have any lab test that is abnormal or we need to change your treatment, we will call you to review the results. ? ? ?Testing/Procedures: ?None ordered ? ? ?Follow-Up: ?At [facility], you and your health needs are our priority.  As part of our continuing mission to provide you with exceptional heart care, we have created designated Provider Care Teams.  These Care Teams include your primary Cardiologist (physician) and Advanced Practice Providers (APPs -  Physician Assistants and Nurse Practitioners) who all work together to provide you with the care you need, when you need it. ? ?We recommend signing up for the patient portal called "MyChart".  Sign up information is provided on this After Visit Summary.  MyChart is used to connect with patients for Virtual Visits (Telemedicine).  Patients are able to view lab/test results, encounter notes, upcoming appointments, etc.  Non-urgent messages can be sent to your provider as well.   ?To learn more about what you can do with MyChart, go to [email or web address].   ? ?Your next appointment:   ?3 month(s) ? ?The format for your next appointment:   ?In Person ? ?Provider:   ?You may see Yvonne Kendall, MD or one of the following Advanced Practice Providers on your designated Care Team:   ?Nicolasa Ducking, NP ?Eula Listen, PA-C ?Cadence Fransico Michael, PA-C ? ?Other Instructions ?N/A ?

## 2022-03-10 ENCOUNTER — Other Ambulatory Visit: Payer: Self-pay

## 2022-03-14 ENCOUNTER — Other Ambulatory Visit: Payer: Self-pay

## 2022-03-15 ENCOUNTER — Other Ambulatory Visit: Payer: Self-pay

## 2022-03-16 ENCOUNTER — Other Ambulatory Visit: Payer: Self-pay

## 2022-03-22 ENCOUNTER — Other Ambulatory Visit: Payer: Self-pay | Admitting: Internal Medicine

## 2022-03-22 ENCOUNTER — Other Ambulatory Visit: Payer: Self-pay

## 2022-03-22 MED ORDER — TORSEMIDE 20 MG PO TABS
40.0000 mg | ORAL_TABLET | Freq: Every day | ORAL | 2 refills | Status: DC
  Filled 2022-03-22: qty 60, 30d supply, fill #0
  Filled 2022-04-28: qty 60, 30d supply, fill #1

## 2022-03-23 ENCOUNTER — Other Ambulatory Visit: Payer: Self-pay

## 2022-04-28 ENCOUNTER — Other Ambulatory Visit: Payer: Self-pay

## 2022-04-28 ENCOUNTER — Other Ambulatory Visit: Payer: Self-pay | Admitting: Physician Assistant

## 2022-04-28 DIAGNOSIS — D500 Iron deficiency anemia secondary to blood loss (chronic): Secondary | ICD-10-CM

## 2022-05-12 ENCOUNTER — Other Ambulatory Visit: Payer: Self-pay

## 2022-05-18 ENCOUNTER — Other Ambulatory Visit: Payer: Self-pay

## 2022-05-18 ENCOUNTER — Other Ambulatory Visit: Payer: Self-pay | Admitting: Pharmacist

## 2022-05-18 ENCOUNTER — Encounter: Payer: Self-pay | Admitting: Physician Assistant

## 2022-05-18 ENCOUNTER — Ambulatory Visit: Payer: Self-pay | Attending: Physician Assistant | Admitting: Physician Assistant

## 2022-05-18 VITALS — BP 181/128 | HR 95 | Temp 98.9°F | Resp 18 | Ht 70.0 in | Wt >= 6400 oz

## 2022-05-18 DIAGNOSIS — E1165 Type 2 diabetes mellitus with hyperglycemia: Secondary | ICD-10-CM

## 2022-05-18 DIAGNOSIS — I252 Old myocardial infarction: Secondary | ICD-10-CM

## 2022-05-18 DIAGNOSIS — H6123 Impacted cerumen, bilateral: Secondary | ICD-10-CM

## 2022-05-18 DIAGNOSIS — I10 Essential (primary) hypertension: Secondary | ICD-10-CM

## 2022-05-18 DIAGNOSIS — E785 Hyperlipidemia, unspecified: Secondary | ICD-10-CM

## 2022-05-18 DIAGNOSIS — I5033 Acute on chronic diastolic (congestive) heart failure: Secondary | ICD-10-CM

## 2022-05-18 DIAGNOSIS — D500 Iron deficiency anemia secondary to blood loss (chronic): Secondary | ICD-10-CM

## 2022-05-18 LAB — GLUCOSE, POCT (MANUAL RESULT ENTRY): POC Glucose: 142 mg/dl — AB (ref 70–99)

## 2022-05-18 LAB — POCT GLYCOSYLATED HEMOGLOBIN (HGB A1C): Hemoglobin A1C: 7.7 % — AB (ref 4.0–5.6)

## 2022-05-18 MED ORDER — TORSEMIDE 20 MG PO TABS
40.0000 mg | ORAL_TABLET | Freq: Every day | ORAL | 2 refills | Status: DC
  Filled 2022-05-18: qty 60, 30d supply, fill #0
  Filled 2022-06-24: qty 60, 30d supply, fill #1
  Filled 2022-08-20: qty 60, 30d supply, fill #2

## 2022-05-18 MED ORDER — INSULIN PEN NEEDLE 31G X 6 MM MISC
1.0000 "application " | Freq: Three times a day (TID) | 2 refills | Status: DC
  Filled 2022-05-18: qty 100, 33d supply, fill #0
  Filled 2022-06-24: qty 100, 25d supply, fill #1
  Filled 2022-09-11 – 2022-10-14 (×2): qty 100, 25d supply, fill #2

## 2022-05-18 MED ORDER — FERROUS SULFATE 325 (65 FE) MG PO TABS
325.0000 mg | ORAL_TABLET | Freq: Every day | ORAL | 3 refills | Status: DC
  Filled 2022-05-18: qty 30, 30d supply, fill #0

## 2022-05-18 MED ORDER — DEBROX 6.5 % OT SOLN
OTIC | 0 refills | Status: DC
  Filled 2022-05-18: qty 15, fill #0

## 2022-05-18 MED ORDER — INSULIN ASPART 100 UNIT/ML FLEXPEN
8.0000 [IU] | PEN_INJECTOR | Freq: Three times a day (TID) | SUBCUTANEOUS | 3 refills | Status: DC
  Filled 2022-05-18: qty 6, 25d supply, fill #0

## 2022-05-18 MED ORDER — BASAGLAR KWIKPEN 100 UNIT/ML ~~LOC~~ SOPN
39.0000 [IU] | PEN_INJECTOR | Freq: Every day | SUBCUTANEOUS | 2 refills | Status: DC
  Filled 2022-05-18 – 2022-05-25 (×2): qty 12, 30d supply, fill #0
  Filled 2022-06-24: qty 12, 30d supply, fill #1
  Filled 2022-08-20 – 2022-08-23 (×2): qty 12, 30d supply, fill #2

## 2022-05-18 MED ORDER — ATORVASTATIN CALCIUM 80 MG PO TABS
80.0000 mg | ORAL_TABLET | Freq: Every day | ORAL | 2 refills | Status: DC
  Filled 2022-05-18: qty 30, 30d supply, fill #0
  Filled 2022-06-24: qty 30, 30d supply, fill #1
  Filled 2022-08-20: qty 30, 30d supply, fill #2
  Filled 2022-10-14: qty 30, 30d supply, fill #3
  Filled 2023-02-13: qty 30, 30d supply, fill #4

## 2022-05-18 MED ORDER — HYDRALAZINE HCL 100 MG PO TABS
100.0000 mg | ORAL_TABLET | Freq: Three times a day (TID) | ORAL | 1 refills | Status: DC
  Filled 2022-05-18: qty 90, 30d supply, fill #0
  Filled 2022-06-24: qty 90, 30d supply, fill #1
  Filled 2022-09-29 – 2022-10-14 (×2): qty 90, 30d supply, fill #2

## 2022-05-18 MED ORDER — ISOSORBIDE MONONITRATE ER 30 MG PO TB24
30.0000 mg | ORAL_TABLET | Freq: Every day | ORAL | 3 refills | Status: DC
  Filled 2022-05-18: qty 30, 30d supply, fill #0
  Filled 2022-06-24: qty 30, 30d supply, fill #1

## 2022-05-18 MED ORDER — FAMOTIDINE 20 MG PO TABS
ORAL_TABLET | Freq: Every day | ORAL | 2 refills | Status: AC
  Filled 2022-05-18: qty 30, 30d supply, fill #0
  Filled 2022-06-24: qty 90, 90d supply, fill #1
  Filled 2022-09-29: qty 30, 30d supply, fill #2
  Filled 2022-09-29: qty 90, 90d supply, fill #2
  Filled 2022-10-14: qty 30, 30d supply, fill #2

## 2022-05-18 MED ORDER — POTASSIUM CHLORIDE ER 10 MEQ PO TBCR
10.0000 meq | EXTENDED_RELEASE_TABLET | Freq: Two times a day (BID) | ORAL | 2 refills | Status: DC
  Filled 2022-05-18: qty 60, 30d supply, fill #0
  Filled 2022-06-24: qty 60, 30d supply, fill #1
  Filled 2022-08-20: qty 60, 30d supply, fill #2
  Filled 2022-10-14: qty 60, 30d supply, fill #3
  Filled 2023-02-13: qty 180, 90d supply, fill #4

## 2022-05-18 MED ORDER — CARVEDILOL 25 MG PO TABS
25.0000 mg | ORAL_TABLET | Freq: Two times a day (BID) | ORAL | 3 refills | Status: DC
  Filled 2022-05-18: qty 60, 30d supply, fill #0
  Filled 2022-06-24: qty 180, 90d supply, fill #1
  Filled 2022-10-14: qty 60, 30d supply, fill #2
  Filled 2023-02-13: qty 180, 90d supply, fill #3

## 2022-05-18 MED ORDER — INSULIN LISPRO (1 UNIT DIAL) 100 UNIT/ML (KWIKPEN)
8.0000 [IU] | PEN_INJECTOR | Freq: Three times a day (TID) | SUBCUTANEOUS | 2 refills | Status: DC
  Filled 2022-05-18 – 2022-05-25 (×2): qty 6, 25d supply, fill #0
  Filled 2022-06-24: qty 6, 25d supply, fill #1
  Filled 2022-08-20 – 2022-08-23 (×2): qty 6, 25d supply, fill #2

## 2022-05-18 MED ORDER — ALBUTEROL SULFATE HFA 108 (90 BASE) MCG/ACT IN AERS
INHALATION_SPRAY | RESPIRATORY_TRACT | 1 refills | Status: AC
  Filled 2022-05-18: qty 18, 25d supply, fill #0

## 2022-05-18 MED ORDER — INSULIN DETEMIR 100 UNIT/ML FLEXPEN
39.0000 [IU] | PEN_INJECTOR | Freq: Every day | SUBCUTANEOUS | 3 refills | Status: DC
  Filled 2022-05-18: qty 12, 30d supply, fill #0

## 2022-05-18 MED ORDER — CLOPIDOGREL BISULFATE 75 MG PO TABS
75.0000 mg | ORAL_TABLET | Freq: Every day | ORAL | 0 refills | Status: DC
  Filled 2022-05-18: qty 30, 30d supply, fill #0
  Filled 2022-06-24: qty 30, 30d supply, fill #1
  Filled 2022-08-20: qty 30, 30d supply, fill #2

## 2022-05-18 NOTE — Patient Instructions (Addendum)
Check blood sugars fasting and at bedtime and record and bring to next visit.  Your blood pressure is dangerously high.  Resume your medications and check blood pressure daily and record.    Use debrox drops the night and morning prior to your next appt.

## 2022-05-18 NOTE — Progress Notes (Signed)
Patient is here for DMll,  Patient c/o his left ear "stop up" . Patient said that it comes and goes

## 2022-05-18 NOTE — Progress Notes (Signed)
Patient ID: Erik [name], male   DOB: 1991-07-07, 31 y.o.   MRN: [id number]     Erik [name], is a 31 y.o. male  MWN:[id number]  QIH:[id number]  DOB - [birth year]  No chief complaint on file.      Subjective:   Erik [name] is a 31 y.o. male here today for med RF.  He took 1 BP pill this morning but can't tell me the name of it.  He took 8 units of SSI.  He has been out of [place] for about 1 month.  He has a BP cuff and glucometer at home.  His mom is here with him.  No complaints other than ear pressure.  No CP/SOB.  Sees cardiology 2-3 times yearly.  Denies HA/CP/SOB.    No problems updated.  ALLERGIES: No Known Allergies  PAST MEDICAL HISTORY: Past Medical History:  Diagnosis Date   ARDS survivor    a. 01/2021 in setting of COVID PNA.   Asthma    BMI 60.0-69.9, adult ([place])    Chronic heart failure with preserved ejection fraction (HFpEF) ([place])    a. 01/2021 LV gram: EF 35-45%; b. 01/2021 Echo: EF 60-65%; c. 09/2021 Echo: EF 55% (51.8% 2D MOD biplane).   CKD (chronic kidney disease), stage II    a. AKI 01/2021 in setting of COVID.   COVID-19 virus infection 01/2021   Diabetes mellitus without complication (HCC)    Microcytic anemia    Myocarditis ([place])    a. 01/2021 in setting of COVID PNA.   NICM (nonischemic cardiomyopathy) ([place])    a. 01/2021 LV gram: EF 35-45%; b. 01/2021 Echo: EF 60-65%, no rwma, nl RV fxn; c. 09/2021 Echo: EF 55% (51.8% 2D MOD biplane). No rwma, nl RV fxn w/ mild enlargement.   Non-obstructive CAD (coronary artery disease)    a. 01/2021 Cath: LM nl, LAD nl, RI nl, LCX nl, LPAV mild dzs, RCA nl, RPDA min irregs. EF 35-45%.   Super-super obese ([place])    a. BMI 63.71 - 11/2021.   Ventricular tachycardia ([place])    a. 01/2021 in setting of myocarditis/COVID.    MEDICATIONS AT HOME: Prior to Admission medications   Medication Sig Start Date End Date Taking? Authorizing Provider  aspirin 81 MG chewable tablet Chew 1 tablet (81 mg total) by mouth daily. 04/22/21  Yes  Freeman Caldron M, PA-C  blood glucose meter kit and supplies KIT Dispense based on patient and insurance preference. Use up to four times daily as directed. 02/26/21  Yes Love, Ivan Anchors, PA-C  carbamide peroxide (DEBROX) 6.5 % OTIC solution Use 5 drops in each ear the night before your next appt 05/18/22  Yes Jaquilla Woodroof, Dionne Bucy, PA-C  glucose blood (TRUE METRIX BLOOD GLUCOSE TEST) test strip USE UP TO FOUR TIMES DAILY AS DIRECTED 07/14/21 07/13/22 Yes Charlott Rakes, MD  Multiple Vitamin (MULTIVITAMIN WITH MINERALS) TABS tablet Take 1 tablet by mouth daily. 02/04/21  Yes Lorella Nimrod, MD  albuterol (VENTOLIN HFA) 108 (90 Base) MCG/ACT inhaler INHALE 1-2 PUFFS INTO THE LUNGS EVERY SIX HOURS AS NEEDED FOR WHEEZING OR SHORTNESS OF BREATH. 05/18/22 05/18/23  Argentina Donovan, PA-C  atorvastatin (LIPITOR) 80 MG tablet Take 1 tablet (80 mg total) by mouth daily. 05/18/22   Argentina Donovan, PA-C  carvedilol (COREG) 25 MG tablet Take 1 tablet (25 mg total) by mouth 2 (two) times daily. 05/18/22 08/16/22  Argentina Donovan, PA-C  clopidogrel (PLAVIX) 75 MG tablet Take 1 tablet (75 mg total) by  mouth daily. 05/18/22   Argentina Donovan, PA-C  famotidine (PEPCID) 20 MG tablet TAKE 1 TABLET (20 MG TOTAL) BY MOUTH DAILY. 05/18/22 05/18/23  Argentina Donovan, PA-C  ferrous sulfate 325 (65 FE) MG tablet Take 1 tablet (325 mg total) by mouth daily with breakfast. 05/18/22   Argentina Donovan, PA-C  hydrALAZINE (APRESOLINE) 100 MG tablet Take 1 tablet (100 mg total) by mouth 3 (three) times daily. 05/18/22 08/16/22  Argentina Donovan, PA-C  insulin aspart (NOVOLOG) 100 UNIT/ML FlexPen Inject 8 Units into the skin 3 (three) times daily with meals. 05/18/22   Argentina Donovan, PA-C  insulin detemir (LEVEMIR) 100 UNIT/ML FlexPen Inject 39 Units into the skin at bedtime. 05/18/22 05/18/23  Argentina Donovan, PA-C  Insulin Pen Needle 31G X 6 MM MISC Use as directed with breakfast, with lunch, and with evening meal. 05/18/22   Roya Gieselman, Dionne Bucy, PA-C   isosorbide mononitrate (IMDUR) 30 MG 24 hr tablet Take 1 tablet (30 mg total) by mouth daily. 05/18/22 08/16/22  Argentina Donovan, PA-C  potassium chloride (KLOR-CON) 10 MEQ tablet Take 1 tablet (10 mEq total) by mouth 2 (two) times daily. 05/18/22   Argentina Donovan, PA-C  torsemide (DEMADEX) 20 MG tablet Take 2 tablets (40 mg total) by mouth daily. 05/18/22   Kaydon Creedon, Dionne Bucy, PA-C    ROS: Neg resp Neg cardiac Neg GI Neg GU Neg MS Neg psych Neg neuro  Objective:   Vitals:   05/18/22 0947 05/18/22 0956  BP: (!) 182/133 (!) 181/128  Pulse: 95   Resp: 18   Temp: 98.9 F (37.2 C)   TempSrc: Temporal   SpO2: 100%   Weight: (!) 458 lb (207.7 kg)   Height: '5\' 10"'  (1.778 m)    Exam General appearance : Awake, alert, not in any distress. Speech Clear. Not toxic looking.  Morbidly obese HEENT: Atraumatic and Normocephalic.  B ears with cerumen not blocking TM but rather full Neck: Supple, no JVD. No cervical lymphadenopathy.  Chest: Good air entry bilaterally, CTAB.  No rales/rhonchi/wheezing CVS: S1 S2 regular, no murmurs.  Extremities: B/L Lower Ext shows no edema, both legs are warm to touch Neurology: Awake alert, and oriented X 3, CN II-XII intact, Non focal Skin: No Rash  Data Review Lab Results  Component Value Date   HGBA1C 7.7 (A) 05/18/2022   HGBA1C 6.6 04/22/2021   HGBA1C 11.8 (H) 01/27/2021    Assessment & Plan   1. Type 2 diabetes mellitus with hyperglycemia, unspecified whether long term insulin use (HCC) Uncontrolled.  Resume levimir 39 units and SSI.  He had been out of levimir for about 1 month.  Check blood sugars fasting and bedtime and record and bring to next visit.  Work on diabetic diet.  - Glucose (CBG) - HgB A1c - Comprehensive metabolic panel - potassium chloride (KLOR-CON) 10 MEQ tablet; Take 1 tablet (10 mEq total) by mouth 2 (two) times daily.  Dispense: 180 tablet; Refill: 2  2. Dyslipidemia - Comprehensive metabolic panel - Lipid panel -  atorvastatin (LIPITOR) 80 MG tablet; Take 1 tablet (80 mg total) by mouth daily.  Dispense: 90 tablet; Refill: 2  3. Iron deficiency anemia due to chronic blood loss - CBC with Differential/Platelet - ferrous sulfate 325 (65 FE) MG tablet; Take 1 tablet (325 mg total) by mouth daily with breakfast.  Dispense: 90 tablet; Refill: 3 - famotidine (PEPCID) 20 MG tablet; TAKE 1 TABLET (20 MG TOTAL) BY MOUTH DAILY.  Dispense: 90 tablet; Refill: 2  4. Acute on chronic heart failure with preserved ejection fraction (HFpEF) (HCC) - carvedilol (COREG) 25 MG tablet; Take 1 tablet (25 mg total) by mouth 2 (two) times daily.  Dispense: 180 tablet; Refill: 3 - clopidogrel (PLAVIX) 75 MG tablet; Take 1 tablet (75 mg total) by mouth daily.  Dispense: 90 tablet; Refill: 0 - isosorbide mononitrate (IMDUR) 30 MG 24 hr tablet; Take 1 tablet (30 mg total) by mouth daily.  Dispense: 90 tablet; Refill: 3 - torsemide (DEMADEX) 20 MG tablet; Take 2 tablets (40 mg total) by mouth daily.  Dispense: 60 tablet; Refill: 2 - ferrous sulfate 325 (65 FE) MG tablet; Take 1 tablet (325 mg total) by mouth daily with breakfast.  Dispense: 90 tablet; Refill: 3  5. Essential hypertension Uncontrolled-I am reluctant to give him clonidine bc he took "something" this morning.  Resume regimen.  Check blood pressure daily and record and bring to next visit.   - carvedilol (COREG) 25 MG tablet; Take 1 tablet (25 mg total) by mouth 2 (two) times daily.  Dispense: 180 tablet; Refill: 3 - isosorbide mononitrate (IMDUR) 30 MG 24 hr tablet; Take 1 tablet (30 mg total) by mouth daily.  Dispense: 90 tablet; Refill: 3 - potassium chloride (KLOR-CON) 10 MEQ tablet; Take 1 tablet (10 mEq total) by mouth 2 (two) times daily.  Dispense: 180 tablet; Refill: 2 - hydrALAZINE (APRESOLINE) 100 MG tablet; Take 1 tablet (100 mg total) by mouth 3 (three) times daily.  Dispense: 270 tablet; Refill: 1  6. History of non-ST elevation myocardial infarction  (NSTEMI) - clopidogrel (PLAVIX) 75 MG tablet; Take 1 tablet (75 mg total) by mouth daily.  Dispense: 90 tablet; Refill: 0  7. Bilateral impacted cerumen - carbamide peroxide (DEBROX) 6.5 % OTIC solution; Use 5 drops in each ear the night before your next appt  Dispense: 15 mL; Refill: 0    Return in about 3 weeks (around 06/08/2022) for [facility] for DM/htn AND Nurse visit for cerument impaction; 3 months with PCP.  The patient was given clear instructions to go to ER or return to medical center if symptoms don't improve, worsen or new problems develop. The patient verbalized understanding. The patient was told to call to get lab results if they haven't heard anything in the next week.      Freeman Caldron, PA-C [facility] [place], [place]   05/18/2022, 10:16 AM

## 2022-05-19 ENCOUNTER — Other Ambulatory Visit: Payer: Self-pay | Admitting: Physician Assistant

## 2022-05-19 DIAGNOSIS — R7989 Other specified abnormal findings of blood chemistry: Secondary | ICD-10-CM

## 2022-05-19 LAB — CBC WITH DIFFERENTIAL/PLATELET
Basophils Absolute: 0.1 10*3/uL (ref 0.0–0.2)
Basos: 1 %
EOS (ABSOLUTE): 0.3 10*3/uL (ref 0.0–0.4)
Eos: 4 %
Hematocrit: 34 % — ABNORMAL LOW (ref 37.5–51.0)
Hemoglobin: 10.3 g/dL — ABNORMAL LOW (ref 13.0–17.7)
Immature Grans (Abs): 0.1 10*3/uL (ref 0.0–0.1)
Immature Granulocytes: 1 %
Lymphocytes Absolute: 1.5 10*3/uL (ref 0.7–3.1)
Lymphs: 15 %
MCH: 23.8 pg — ABNORMAL LOW (ref 26.6–33.0)
MCHC: 30.3 g/dL — ABNORMAL LOW (ref 31.5–35.7)
MCV: 79 fL (ref 79–97)
Monocytes Absolute: 1 10*3/uL — ABNORMAL HIGH (ref 0.1–0.9)
Monocytes: 10 %
Neutrophils Absolute: 6.7 10*3/uL (ref 1.4–7.0)
Neutrophils: 69 %
Platelets: 370 10*3/uL (ref 150–450)
RBC: 4.33 x10E6/uL (ref 4.14–5.80)
RDW: 14.5 % (ref 11.6–15.4)
WBC: 9.6 10*3/uL (ref 3.4–10.8)

## 2022-05-19 LAB — COMPREHENSIVE METABOLIC PANEL
ALT: 16 IU/L (ref 0–44)
AST: 13 IU/L (ref 0–40)
Albumin/Globulin Ratio: 1.2 (ref 1.2–2.2)
Albumin: 3.7 g/dL — ABNORMAL LOW (ref 4.0–5.0)
Alkaline Phosphatase: 114 IU/L (ref 44–121)
BUN/Creatinine Ratio: 11 (ref 9–20)
BUN: 24 mg/dL — ABNORMAL HIGH (ref 6–20)
Bilirubin Total: 0.3 mg/dL (ref 0.0–1.2)
CO2: 22 mmol/L (ref 20–29)
Calcium: 9 mg/dL (ref 8.7–10.2)
Chloride: 111 mmol/L — ABNORMAL HIGH (ref 96–106)
Creatinine, Ser: 2.14 mg/dL — ABNORMAL HIGH (ref 0.76–1.27)
Globulin, Total: 3.2 g/dL (ref 1.5–4.5)
Glucose: 159 mg/dL — ABNORMAL HIGH (ref 70–99)
Potassium: 4.6 mmol/L (ref 3.5–5.2)
Sodium: 148 mmol/L — ABNORMAL HIGH (ref 134–144)
Total Protein: 6.9 g/dL (ref 6.0–8.5)
eGFR: 41 mL/min/{1.73_m2} — ABNORMAL LOW (ref >59)

## 2022-05-19 LAB — LIPID PANEL
Chol/HDL Ratio: 3.6 ratio (ref 0.0–5.0)
Cholesterol, Total: 174 mg/dL (ref 100–199)
HDL: 49 mg/dL (ref >39)
LDL Chol Calc (NIH): 108 mg/dL — ABNORMAL HIGH (ref 0–99)
Triglycerides: 94 mg/dL (ref 0–149)
VLDL Cholesterol Cal: 17 mg/dL (ref 5–40)

## 2022-05-20 ENCOUNTER — Other Ambulatory Visit: Payer: Self-pay

## 2022-05-25 ENCOUNTER — Other Ambulatory Visit: Payer: Self-pay

## 2022-06-01 ENCOUNTER — Other Ambulatory Visit: Payer: Self-pay

## 2022-06-10 ENCOUNTER — Ambulatory Visit: Payer: Self-pay | Admitting: Medical

## 2022-06-22 ENCOUNTER — Other Ambulatory Visit: Payer: Self-pay

## 2022-06-24 ENCOUNTER — Ambulatory Visit (HOSPITAL_BASED_OUTPATIENT_CLINIC_OR_DEPARTMENT_OTHER): Payer: Self-pay

## 2022-06-24 ENCOUNTER — Ambulatory Visit: Payer: Self-pay | Attending: Pharmacist | Admitting: Pharmacist

## 2022-06-24 ENCOUNTER — Other Ambulatory Visit: Payer: Self-pay

## 2022-06-24 VITALS — BP 174/106 | HR 94

## 2022-06-24 DIAGNOSIS — I10 Essential (primary) hypertension: Secondary | ICD-10-CM

## 2022-06-24 DIAGNOSIS — E1165 Type 2 diabetes mellitus with hyperglycemia: Secondary | ICD-10-CM

## 2022-06-24 DIAGNOSIS — H6123 Impacted cerumen, bilateral: Secondary | ICD-10-CM

## 2022-06-24 MED ORDER — OZEMPIC (0.25 OR 0.5 MG/DOSE) 2 MG/3ML ~~LOC~~ SOPN
0.2500 mg | PEN_INJECTOR | SUBCUTANEOUS | 1 refills | Status: DC
  Filled 2022-06-24: qty 3, 56d supply, fill #0

## 2022-06-24 MED ORDER — ISOSORBIDE MONONITRATE ER 60 MG PO TB24
60.0000 mg | ORAL_TABLET | Freq: Every day | ORAL | 2 refills | Status: DC
  Filled 2022-06-24: qty 30, 30d supply, fill #0
  Filled 2022-08-20: qty 30, 30d supply, fill #1

## 2022-06-24 NOTE — Progress Notes (Signed)
Pt arrived for ear wax removal

## 2022-06-24 NOTE — Progress Notes (Signed)
S:     No chief complaint on file.  Erik [name] is a 31 y.o. male who presents for diabetes evaluation, education, and management. 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   Patient was referred and last seen by Marylene Land on 05/18/2022. Prior to that visit, he had been without Levemir. Basaglar was sent in and he was resumed on mealtime insulin as well. He was also without his BP medications. These were refilled and resumed.   Today, patient arrives in good spirits and presents without any assistance. He has resumed his medications since his visit with Marylene Land.   DM:  Patient reports Diabetes was diagnosed in 2022. No hospitalization since the one in 2022. No hx of pancreatitis. No hx thyroid cancer. Has been on insulin since the time of dx. No other therapies for DM. Briefly, he was hospitalized 2/1 - 02/03/2021 with covid-19 PNA that progressed to ARDS with severe acidosis and multiorgan failure. During this hospitalization, he was taken to the cath lab with acute STEMI. Cath was negative for CAD, myocarditis suspected. He was also dx with T2DM during this hospitalization and was discharged on basal-bolus insulin.   HTN:  Today, no chest pain, dizziness, or HA. No changes in Cardiac hx since seeing his Cardiologist earlier this year (02/2022). Deemed not a good candidate for amlodipine d/t severe LLE edema. It was recommended he be considered for a RAS agent if kidney function allows. However, his Scr has showed decline since 11/17/21 (1.55>>2.06>>2.14).   Family/Social History:  Fhx: no hx of thyroid cancer. Positive for DM, HTN, stroke, heart disease.   Current diabetes medications include: Basaglar 38 units daily (in the morning), Humalog 8 units TID (injecting before meals) Current hypertension medications include: carvedilol 25 mg BIID, hydralazine  100 TID, isosorbide mononitrate 30 mg daily, torsemide 20 mg, two tablets daily Current hyperlipidemia medications include: atorvastatin 80 mg daily   Patient reports adherence to taking all medications as prescribed. Tells me today that he does not miss injections. Admits to missing several doses of atorvastatin. Misses hydralazine several times per week.   Reported home blood pressures:  -SBPs: 150s-170s -DBPs: 70s-90s  Insurance coverage: none  Patient denies hypoglycemic events.  Reported home fasting blood sugars: 90 - 150s  Reported 2 hour post-meal/random blood sugars: 150s - 200s .  Patient denies nocturia (nighttime urination).  Patient denies neuropathy (nerve pain). Patient reports visual changes. Patient reports self foot exams.   Patient reported dietary habits: -Caffeine: denies excessive -Sodium: does not add salt. Endorses heavy intake of take-out foods  -Take-outs: burger, chicken sandwiches  -White carbs: bread, rice  -Sweets: drinks a lot of sweet tea. Rare craving for sweet foods.   Patient-reported exercise habits:  -Limited    O:   ROS  Physical Exam   Lab Results  Component Value Date   HGBA1C 7.7 (A) 05/18/2022   Vitals:   06/24/22 0906  BP: (!) 174/106  Pulse: 94    Lipid Panel     Component Value Date/Time   CHOL 174 05/18/2022 1045   TRIG 94 05/18/2022 1045   HDL 49 05/18/2022 1045   CHOLHDL 3.6 05/18/2022 1045   CHOLHDL 3.1 01/27/2021 1105   VLDL 46 (H) 01/27/2021 1105   LDLCALC 108 (H) 05/18/2022 1045    Clinical Atherosclerotic Cardiovascular Disease (ASCVD): Yes  The ASCVD Risk score (  Arnett DK, et al., 2019) failed to calculate for the following reasons:   The 2019 ASCVD risk score is only valid for ages 40 to 20   The patient has a prior MI or stroke diagnosis   A/P: Diabetes longstanding currently uncontrolled but close to goal. Patient is able to verbalize appropriate hypoglycemia management plan. Medication  adherence appears appropriate. -Continued current insulin regimen. -Started Ozempic 0.25 mg once weekly.  -Will hold off on metformin and SGLT-2i with current kidney function.  -Patient educated on purpose, proper use, and potential adverse effects of Ozempic.  -Extensively discussed pathophysiology of diabetes, recommended lifestyle interventions, dietary effects on blood sugar control.  -Counseled on s/sx of and management of hypoglycemia.  -Next A1c anticipated 08/2022.   ASCVD risk - secondary prevention in patient with diabetes. Last LDL is not at goal. Recommend LDL to be at least <70, but <55 appropriate in the setting of DM + hx of STEMI. high intensity statin indicated.  -Continued atorvastatin 80 mg.   Hypertension longstanding currently above goal. Blood pressure goal of <130/80 mmHg. Medication adherence is appropriate. Cannot use RAS agent, MRA with his current renal function and continued decline. He sees Nephrology next week. -Continue current regimen.  Written patient instructions provided. Patient verbalized understanding of treatment plan.  Total time in face to face counseling 30 minutes.    Follow-up:  Pharmacist in 1 month.  Butch [name], PharmD, P[place]y [place], CPP Clinical Pharmacist [facility] [phone]

## 2022-06-27 ENCOUNTER — Other Ambulatory Visit: Payer: Self-pay

## 2022-06-27 ENCOUNTER — Ambulatory Visit: Payer: Self-pay

## 2022-07-01 ENCOUNTER — Other Ambulatory Visit: Payer: Self-pay

## 2022-07-06 ENCOUNTER — Other Ambulatory Visit: Payer: Self-pay

## 2022-07-27 ENCOUNTER — Telehealth: Payer: Self-pay | Admitting: Emergency Medicine

## 2022-07-27 ENCOUNTER — Other Ambulatory Visit: Payer: Self-pay

## 2022-07-27 ENCOUNTER — Other Ambulatory Visit: Payer: Self-pay | Admitting: Pharmacist

## 2022-07-27 DIAGNOSIS — E1165 Type 2 diabetes mellitus with hyperglycemia: Secondary | ICD-10-CM

## 2022-07-27 MED ORDER — OZEMPIC (0.25 OR 0.5 MG/DOSE) 2 MG/3ML ~~LOC~~ SOPN
0.2500 mg | PEN_INJECTOR | SUBCUTANEOUS | 1 refills | Status: DC
  Filled 2022-07-27: qty 3, fill #0
  Filled 2022-07-27: qty 3, 56d supply, fill #0

## 2022-07-27 MED ORDER — TRULICITY 0.75 MG/0.5ML ~~LOC~~ SOAJ
0.7500 mg | SUBCUTANEOUS | 0 refills | Status: DC
  Filled 2022-07-27: qty 2, 28d supply, fill #0

## 2022-07-27 NOTE — Telephone Encounter (Signed)
Copied from CRM [id number]. Topic: Appointment Scheduling - Scheduling Inquiry for Clinic >> Jul 27, 2022 11:01 AM Esperanza Sheets wrote: Reason for CRM: Patient's mother states pt has an appt on 8-18 to fu on Semaglutide,0.25 or 0.5MG /DOS, (OZEMPIC, 0.25 OR 0.5 MG/DOSE,) 2 MG/3ML SOPN  Mother states pt has not started taking medication yet due to pharmacy being out of stock and do not know when medication will be in stock  Mother inquiring if appt w/ Franky Macho is still needed or does he recommend to r/s   Please assist further

## 2022-07-27 NOTE — Telephone Encounter (Signed)
Please have him reschedule. I sent a rx for Trulicity to our pharmacy to replace the Ozempic.

## 2022-07-27 NOTE — Addendum Note (Signed)
Addended by: Lois Huxley, Jeannett Senior L on: 07/27/2022 01:39 PM   Modules accepted: Orders

## 2022-07-28 ENCOUNTER — Other Ambulatory Visit: Payer: Self-pay

## 2022-07-29 ENCOUNTER — Other Ambulatory Visit: Payer: Self-pay

## 2022-07-29 ENCOUNTER — Ambulatory Visit: Payer: Self-pay | Admitting: Pharmacist

## 2022-08-22 ENCOUNTER — Other Ambulatory Visit: Payer: Self-pay

## 2022-08-23 ENCOUNTER — Other Ambulatory Visit: Payer: Self-pay

## 2022-09-02 ENCOUNTER — Ambulatory Visit: Payer: Self-pay | Attending: Family Medicine | Admitting: Pharmacist

## 2022-09-02 ENCOUNTER — Encounter: Payer: Self-pay | Admitting: Pharmacist

## 2022-09-02 VITALS — BP 152/98 | HR 91

## 2022-09-02 DIAGNOSIS — E1165 Type 2 diabetes mellitus with hyperglycemia: Secondary | ICD-10-CM

## 2022-09-02 DIAGNOSIS — I10 Essential (primary) hypertension: Secondary | ICD-10-CM

## 2022-09-02 NOTE — Progress Notes (Signed)
S:     No chief complaint on file.  Erik [name] is a 31 y.o. male who presents for diabetes evaluation, education, and management. PMH is significant for [id number] PNA complicated by ARDS with acute respiratory failure in 2022, STEMI in the setting of myocarditis, T2DM w/ DKA, AKI/CKD, morbid obesity, dyslipidemia.   Patient was referred and last seen by Levada Dy on 05/18/2022. I saw him on 06/24/2022. I started Ozempic.  Today, patient arrives in good spirits and presents without any assistance. He continues to be adherent with all medications.   DM:  Of note, he was able to start Ozempic ~4 weeks ago. It took some time to get his PASS approved. Since receiving the Ozempic he experienced some NV but this was minimal and improved. He is compliant with his insulin regimen and denies any hypoglycemia. Home CBG control listed below. He feels better overall and denies any symptoms of hyperglycemia at this time.   HTN:  Today, no chest pain, dizziness, or HA. No changes in Cardiac hx since seeing his Cardiologist earlier this year (02/2022). Deemed not a good candidate for amlodipine d/t severe LLE edema. It was recommended he be considered for a RAS agent if kidney function allows. However, his Scr has showed decline since 11/17/21 (1.55>>2.06>>2.14). We will get labs today to reevaluate this.   Family/Social History:  Fhx: no hx of thyroid cancer. Positive for DM, HTN, stroke, heart disease.   Current diabetes medications include: Basaglar 38 units daily (in the morning), Humalog 8 units TID (injecting before meals), Ozempic 0.25 mg weekly Current hypertension medications include: carvedilol 25 mg BIID, hydralazine 100 TID, isosorbide mononitrate 60 mg daily, torsemide 20 mg, two tablets daily Current hyperlipidemia medications include: atorvastatin 80 mg daily   Patient reports adherence to taking all medications as prescribed.   Reported home blood pressures:  -SBPs: 150s-170s -DBPs:  70s-90s  Insurance coverage: none  Patient denies hypoglycemic events.  Reported home fasting blood sugars: 90s Reported 2 hour post-meal/random blood sugars: 90s. Denies any readings >180.  Patient denies nocturia (nighttime urination).  Patient denies neuropathy (nerve pain). Patient denies visual changes. Patient reports self foot exams.   Patient reported dietary habits: -Caffeine: denies excessive -Sodium: does not add salt. Endorses heavy intake of take-out foods  -Take-outs: burger, chicken sandwiches  -White carbs: bread, rice  -Sweets: drinks a lot of sweet tea. Rare craving for sweet foods.   Patient-reported exercise habits:  -Limited    O:   ROS  Physical Exam   Lab Results  Component Value Date   HGBA1C 7.7 (A) 05/18/2022   Vitals:   09/02/22 1046  BP: (!) 152/98  Pulse: 91   Lipid Panel     Component Value Date/Time   CHOL 174 05/18/2022 1045   TRIG 94 05/18/2022 1045   HDL 49 05/18/2022 1045   CHOLHDL 3.6 05/18/2022 1045   CHOLHDL 3.1 01/27/2021 1105   VLDL 46 (H) 01/27/2021 1105   LDLCALC 108 (H) 05/18/2022 1045    Clinical Atherosclerotic Cardiovascular Disease (ASCVD): Yes  The ASCVD Risk score (Arnett DK, et al., 2019) failed to calculate for the following reasons:   The 2019 ASCVD risk score is only valid for ages 53 to 56   The patient has a prior MI or stroke diagnosis   A/P: Diabetes longstanding currently uncontrolled but home sugars are at goal. Patient is able to verbalize appropriate hypoglycemia management plan. Medication adherence appears appropriate. He wishes to remain on Ozempic  at this time. I recommend to increase his dose to 0.5 mg weekly and he will discuss this with Dr. Margarita Rana on 09/05/2022.  -Continued current insulin regimen. -Continue Ozempic 0.25 mg once weekly. Recommend to increase to 0.5 mg weekly next week at next PCP visit in an effort to decrease insulin doses.  -Will hold off on metformin and SGLT-2i with  current kidney function.  -Patient educated on purpose, proper use, and potential adverse effects of Ozempic.  -Extensively discussed pathophysiology of diabetes, recommended lifestyle interventions, dietary effects on blood sugar control.  -Counseled on s/sx of and management of hypoglycemia.  -Next A1c anticipated 08/2022.   ASCVD risk - secondary prevention in patient with diabetes. Last LDL is not at goal. Recommend LDL to be at least <70, but <55 appropriate in the setting of DM + hx of STEMI. High intensity statin indicated.  -Continued atorvastatin 80 mg.   Hypertension longstanding currently above goal. Blood pressure is slowly improving. Blood pressure goal of <130/80 mmHg. Medication adherence is appropriate. Recently saw Nephrology. Will get labs today to assess for appropriateness of RAS therapy.  -Continue current regimen for now. Recommend to add ACEi/ARB depending on renal function today.  -[id number]  Written patient instructions provided. Patient verbalized understanding of treatment plan.  Total time in face to face counseling 30 minutes.    Follow-up:  Pharmacist in 1 month.  Benard Halsted, PharmD, [place], [place] [phone]

## 2022-09-03 LAB — CMP14+EGFR
ALT: 13 IU/L (ref 0–44)
AST: 13 IU/L (ref 0–40)
Albumin/Globulin Ratio: 1.4 (ref 1.2–2.2)
Albumin: 3.6 g/dL — ABNORMAL LOW (ref 4.1–5.1)
Alkaline Phosphatase: 125 IU/L — ABNORMAL HIGH (ref 44–121)
BUN/Creatinine Ratio: 13 (ref 9–20)
BUN: 30 mg/dL — ABNORMAL HIGH (ref 6–20)
Bilirubin Total: 0.3 mg/dL (ref 0.0–1.2)
CO2: 20 mmol/L (ref 20–29)
Calcium: 8.6 mg/dL — ABNORMAL LOW (ref 8.7–10.2)
Chloride: 109 mmol/L — ABNORMAL HIGH (ref 96–106)
Creatinine, Ser: 2.37 mg/dL — ABNORMAL HIGH (ref 0.76–1.27)
Globulin, Total: 2.6 g/dL (ref 1.5–4.5)
Glucose: 89 mg/dL (ref 70–99)
Potassium: 4.7 mmol/L (ref 3.5–5.2)
Sodium: 142 mmol/L (ref 134–144)
Total Protein: 6.2 g/dL (ref 6.0–8.5)
eGFR: 37 mL/min/{1.73_m2} — ABNORMAL LOW (ref >59)

## 2022-09-05 ENCOUNTER — Encounter: Payer: Self-pay | Admitting: Family Medicine

## 2022-09-05 ENCOUNTER — Other Ambulatory Visit: Payer: Self-pay

## 2022-09-05 ENCOUNTER — Ambulatory Visit: Payer: Self-pay | Attending: Family Medicine | Admitting: Family Medicine

## 2022-09-05 VITALS — BP 154/94 | HR 100 | Temp 98.2°F | Ht 70.0 in | Wt >= 6400 oz

## 2022-09-05 DIAGNOSIS — Z23 Encounter for immunization: Secondary | ICD-10-CM

## 2022-09-05 DIAGNOSIS — I10 Essential (primary) hypertension: Secondary | ICD-10-CM

## 2022-09-05 DIAGNOSIS — N1832 Chronic kidney disease, stage 3b: Secondary | ICD-10-CM

## 2022-09-05 DIAGNOSIS — I429 Cardiomyopathy, unspecified: Secondary | ICD-10-CM

## 2022-09-05 DIAGNOSIS — E1165 Type 2 diabetes mellitus with hyperglycemia: Secondary | ICD-10-CM

## 2022-09-05 DIAGNOSIS — L97512 Non-pressure chronic ulcer of other part of right foot with fat layer exposed: Secondary | ICD-10-CM

## 2022-09-05 DIAGNOSIS — I252 Old myocardial infarction: Secondary | ICD-10-CM

## 2022-09-05 LAB — POCT GLYCOSYLATED HEMOGLOBIN (HGB A1C): HbA1c, POC (controlled diabetic range): 5.9 % (ref 0.0–7.0)

## 2022-09-05 LAB — GLUCOSE, POCT (MANUAL RESULT ENTRY): POC Glucose: 81 mg/dl (ref 70–99)

## 2022-09-05 MED ORDER — OZEMPIC (0.25 OR 0.5 MG/DOSE) 2 MG/3ML ~~LOC~~ SOPN
0.5000 mg | PEN_INJECTOR | SUBCUTANEOUS | 6 refills | Status: DC
  Filled 2022-09-05: qty 3, fill #0
  Filled 2022-09-17 – 2022-10-14 (×2): qty 3, 28d supply, fill #0

## 2022-09-05 MED ORDER — BASAGLAR KWIKPEN 100 UNIT/ML ~~LOC~~ SOPN
35.0000 [IU] | PEN_INJECTOR | Freq: Every day | SUBCUTANEOUS | 2 refills | Status: DC
  Filled 2022-09-05 – 2022-10-14 (×3): qty 12, 34d supply, fill #0

## 2022-09-05 MED ORDER — ISOSORBIDE MONONITRATE ER 120 MG PO TB24
120.0000 mg | ORAL_TABLET | Freq: Every day | ORAL | 1 refills | Status: DC
  Filled 2022-09-05: qty 90, 90d supply, fill #0
  Filled 2022-09-05: qty 30, 30d supply, fill #0
  Filled 2022-11-02: qty 90, 90d supply, fill #0
  Filled 2023-02-13: qty 30, 30d supply, fill #1

## 2022-09-05 NOTE — Patient Instructions (Signed)

## 2022-09-05 NOTE — Progress Notes (Signed)
Subjective:  Patient ID: Erik [name], male    DOB: 19-Sep-1991  Age: 31 y.o. MRN: [id number]  CC: Diabetes (Diabetes f/u.)   HPI JADRIAN BULMAN is a 31 y.o. year old male with a history of type 2 diabetes mellitus (A1c 5.9), stage II CKD, history of nonischemic cardiomyopathy (EF of 55% from echo of 94/4967) complicated by VT, STEMI in the setting of COVID-19 pneumonia with ARDS and AKI, possible myocarditis.  Interval History: Last cardiology visit was in 02/2022 and he denies dyspnea or chest pains. Currently under the care of [facility], last visit was 1 month ago.   A1c is 5.9 today down from 6.6 previously and he is currently on Ozempic as well as Lantus. He has no hypoglycemia, neuropathic symptoms or visual concerns.  He does not exercise regularly and has not had a recent eye exam.  He has an open wound on the sole of his right foot.  This was previously managed by the wound care clinic but he stopped going for his appointments.  Denies presence of pain in his foot. BP is elevated today and he endorses adherence with his antihypertensive Past Medical History:  Diagnosis Date   ARDS survivor    a. 01/2021 in setting of COVID PNA.   Asthma    BMI 60.0-69.9, adult ([place])    Chronic heart failure with preserved ejection fraction (HFpEF) ([place])    a. 01/2021 LV gram: EF 35-45%; b. 01/2021 Echo: EF 60-65%; c. 09/2021 Echo: EF 55% (51.8% 2D MOD biplane).   CKD (chronic kidney disease), stage II    a. AKI 01/2021 in setting of COVID.   COVID-19 virus infection 01/2021   Diabetes mellitus without complication (HCC)    Microcytic anemia    Myocarditis ([place])    a. 01/2021 in setting of COVID PNA.   NICM (nonischemic cardiomyopathy) ([place])    a. 01/2021 LV gram: EF 35-45%; b. 01/2021 Echo: EF 60-65%, no rwma, nl RV fxn; c. 09/2021 Echo: EF 55% (51.8% 2D MOD biplane). No rwma, nl RV fxn w/ mild enlargement.   Non-obstructive CAD (coronary artery disease)    a. 01/2021 Cath: LM nl,  LAD nl, RI nl, LCX nl, LPAV mild dzs, RCA nl, RPDA min irregs. EF 35-45%.   Super-super obese ([place])    a. BMI 63.71 - 11/2021.   Ventricular tachycardia ([place])    a. 01/2021 in setting of myocarditis/COVID.    Past Surgical History:  Procedure Laterality Date   CARDIAC CATHETERIZATION     LEFT HEART CATH AND CORONARY ANGIOGRAPHY N/A 01/20/2021   Procedure: LEFT HEART CATH AND CORONARY ANGIOGRAPHY;  Surgeon: Nelva Bush, MD;  Location: [facility];  Service: Cardiovascular;  Laterality: N/A;    Family History  Problem Relation Age of Onset   Hypertension Mother    Kidney disease Mother    Heart Problems Maternal Grandmother    Heart attack Maternal Grandmother    Heart disease Maternal Grandmother     Social History   Socioeconomic History   Marital status: Single    Spouse name: Not on file   Number of children: Not on file   Years of education: Not on file   Highest education level: Not on file  Occupational History   Not on file  Tobacco Use   Smoking status: Never   Smokeless tobacco: Never  Vaping Use   Vaping Use: Never used  Substance and Sexual Activity   Alcohol use: Never  Drug use: Never   Sexual activity: Never  Other Topics Concern   Not on file  Social History Narrative   Not on file   Social Determinants of Health   Financial Resource Strain: Not on file  Food Insecurity: Not on file  Transportation Needs: Not on file  Physical Activity: Not on file  Stress: Not on file  Social Connections: Not on file    No Known Allergies  Outpatient Medications Prior to Visit  Medication Sig Dispense Refill   albuterol (VENTOLIN HFA) 108 (90 Base) MCG/ACT inhaler INHALE 1-2 PUFFS INTO THE LUNGS EVERY SIX HOURS AS NEEDED FOR WHEEZING OR SHORTNESS OF BREATH. 18 g 1   aspirin 81 MG chewable tablet Chew 1 tablet (81 mg total) by mouth daily. 100 tablet 0   atorvastatin (LIPITOR) 80 MG tablet Take 1 tablet (80 mg total) by mouth daily. 90 tablet 2    blood glucose meter kit and supplies KIT Dispense based on patient and insurance preference. Use up to four times daily as directed. 1 each 0   carvedilol (COREG) 25 MG tablet Take 1 tablet (25 mg total) by mouth 2 (two) times daily. 180 tablet 3   famotidine (PEPCID) 20 MG tablet TAKE 1 TABLET (20 MG TOTAL) BY MOUTH DAILY. 90 tablet 2   ferrous sulfate 325 (65 FE) MG tablet Take 1 tablet (325 mg total) by mouth daily with breakfast. 90 tablet 3   insulin lispro (HUMALOG KWIKPEN) 100 UNIT/ML KwikPen Inject 8 Units into the skin 3 (three) times daily. 6 mL 2   Insulin Pen Needle 31G X 6 MM MISC Use as directed with breakfast, with lunch, and with evening meal. 120 each 2   Multiple Vitamin (MULTIVITAMIN WITH MINERALS) TABS tablet Take 1 tablet by mouth daily.     potassium chloride (KLOR-CON) 10 MEQ tablet Take 1 tablet (10 mEq total) by mouth 2 (two) times daily. 180 tablet 2   torsemide (DEMADEX) 20 MG tablet Take 2 tablets (40 mg total) by mouth daily. 60 tablet 2   clopidogrel (PLAVIX) 75 MG tablet Take 1 tablet (75 mg total) by mouth daily. 90 tablet 0   Insulin Glargine (BASAGLAR KWIKPEN) 100 UNIT/ML Inject 39 Units into the skin once daily. 12 mL 2   isosorbide mononitrate (IMDUR) 60 MG 24 hr tablet Take 1 tablet (60 mg total) by mouth daily. 30 tablet 2   Semaglutide,0.25 or 0.5MG/DOS, (OZEMPIC, 0.25 OR 0.5 MG/DOSE,) 2 MG/3ML SOPN Inject 0.25 mg into the skin once a week. 3 mL 1   carbamide peroxide (DEBROX) 6.5 % OTIC solution Use 5 drops in each ear the night before your next appt (Patient not taking: Reported on 09/05/2022) 15 mL 0   hydrALAZINE (APRESOLINE) 100 MG tablet Take 1 tablet (100 mg total) by mouth 3 (three) times daily. 270 tablet 1   No facility-administered medications prior to visit.     ROS Review of Systems  Constitutional:  Negative for activity change and appetite change.  HENT:  Negative for sinus pressure and sore throat.   Respiratory:  Negative for chest  tightness, shortness of breath and wheezing.   Cardiovascular:  Negative for chest pain and palpitations.  Gastrointestinal:  Negative for abdominal distention, abdominal pain and constipation.  Genitourinary: Negative.   Musculoskeletal: Negative.   Psychiatric/Behavioral:  Negative for behavioral problems and dysphoric mood.     Objective:  BP (!) 154/94   Pulse 100   Temp 98.2 F (36.8 C) (Oral)  Ht _0  (1.778 m)   Wt (!) 476 lb 8 oz (216.1 kg)   SpO2 98%   BMI 68.37 kg/m      09/05/2022   10:25 AM 09/05/2022    9:34 AM 09/02/2022   10:46 AM  BP/Weight  Systolic BP 371 696 789  Diastolic BP 94 381 98  Wt. (Lbs)  476.5   BMI  68.37 kg/m2       Physical Exam Constitutional:      Appearance: He is well-developed. He is obese.  Cardiovascular:     Rate and Rhythm: Normal rate.     Heart sounds: Normal heart sounds. No murmur heard. Pulmonary:     Effort: Pulmonary effort is normal.     Breath sounds: Normal breath sounds. No wheezing or rales.  Chest:     Chest wall: No tenderness.  Abdominal:     General: Bowel sounds are normal. There is no distension.     Palpations: Abdomen is soft. There is no mass.     Tenderness: There is no abdominal tenderness.  Musculoskeletal:        General: Normal range of motion.     Right lower leg: No edema.     Left lower leg: No edema.  Neurological:     Mental Status: He is alert and oriented to person, place, and time.  Psychiatric:        Mood and Affect: Mood normal.    Diabetic Foot Exam - Simple   Simple Foot Form Visual Inspection Sensation Testing Intact to touch and monofilament testing bilaterally: Yes Pulse Check Posterior Tibialis and Dorsalis pulse intact bilaterally: Yes Comments Bilateral pedal edema worse on the right foot.   Hypertrophy of lateral aspect of right foot. Right foot with ulcer in mid sole surrounded by scaly tissue, no discharge.        Latest Ref Rng & Units 09/02/2022   11:03  AM 05/18/2022   10:45 AM 12/20/2021    9:49 AM  CMP  Glucose 70 - 99 mg/dL 89  159  327   BUN 6 - 20 mg/dL _1 Creatinine 0.76 - 1.27 mg/dL 2.37  2.14  2.06   Sodium 134 - 144 mmol/L 142  148  139   Potassium 3.5 - 5.2 mmol/L 4.7  4.6  4.1   Chloride 96 - 106 mmol/L 109  111  106   CO2 20 - 29 mmol/L _2 Calcium 8.7 - 10.2 mg/dL 8.6  9.0  8.5   Total Protein 6.0 - 8.5 g/dL 6.2  6.9    Total Bilirubin 0.0 - 1.2 mg/dL 0.3  0.3    Alkaline Phos 44 - 121 IU/L 125  114    AST 0 - 40 IU/L 13  13    ALT 0 - 44 IU/L 13  16      Lipid Panel     Component Value Date/Time   CHOL 174 05/18/2022 1045   TRIG 94 05/18/2022 1045   HDL 49 05/18/2022 1045   CHOLHDL 3.6 05/18/2022 1045   CHOLHDL 3.1 01/27/2021 1105   VLDL 46 (H) 01/27/2021 1105   LDLCALC 108 (H) 05/18/2022 1045    CBC    Component Value Date/Time   WBC 9.6 05/18/2022 1045   WBC 7.8 05/11/2021 1033   RBC 4.33 05/18/2022 1045   RBC 3.44 (L) 05/11/2021 1033   HGB 10.3 (L) 05/18/2022 1045   HCT  34.0 (L) 05/18/2022 1045   PLT 370 05/18/2022 1045   MCV 79 05/18/2022 1045   MCH 23.8 (L) 05/18/2022 1045   MCH 23.3 (L) 05/11/2021 1033   MCHC 30.3 (L) 05/18/2022 1045   MCHC 29.4 (L) 05/11/2021 1033   RDW 14.5 05/18/2022 1045   LYMPHSABS 1.5 05/18/2022 1045   MONOABS 1.4 (H) 03/09/2021 0415   EOSABS 0.3 05/18/2022 1045   BASOSABS 0.1 05/18/2022 1045    Lab Results  Component Value Date   HGBA1C 5.9 09/05/2022    Assessment & Plan:  1. Type 2 diabetes mellitus with hyperglycemia, unspecified whether long term insulin use (HCC) Controlled with A1c of 5.9 We will titrate up his dose of Ozempic to 0.5 mg and decrease Lantus from 38 units to 35 units Plan is to titrate his GLP-1 RA to maximum tolerated dose while decreasing Lantus in order to prevent hypoglycemia Counseled that if he does experience symptoms of hypoglycemia he can decrease his Lantus by 2 units Counseled on Diabetic diet, my plate method,  983 minutes of moderate intensity exercise/week Blood sugar logs with fasting goals of 80-120 mg/dl, random of less than 180 and in the event of sugars less than 60 mg/dl or greater than 400 mg/dl encouraged to notify the clinic. Advised on the need for annual eye exams, annual foot exams, Pneumonia vaccine. - POCT glucose (manual entry) - POCT glycosylated hemoglobin (Hb A1C) - Microalbumin/Creatinine Ratio, Urine - Semaglutide,0.25 or 0.5MG/DOS, (OZEMPIC, 0.25 OR 0.5 MG/DOSE,) 2 MG/3ML SOPN; Inject 0.5 mg into the skin once a week.  Dispense: 3 mL; Refill: 6 - Insulin Glargine (BASAGLAR KWIKPEN) 100 UNIT/ML; Inject 35 Units into the skin daily.  Dispense: 12 mL; Refill: 2  2. Ulcer of right foot with fat layer exposed ([place]) Advised he needs to reschedule an appointment with the wound care clinic Right foot exam suspicious for Charcot foot hence we will proceed with x-ray - DG Foot Complete Right; Future  3. Need for immunization against influenza - Flu Vaccine QUAD 8moIM (Fluarix, Fluzone & Alfiuria Quad PF)  4. Essential hypertension Uncontrolled Isosorbide dose increased Counseled on blood pressure goal of less than 130/80, low-sodium, DASH diet, medication compliance, 150 minutes of moderate intensity exercise per week. Discussed medication compliance, adverse effects. - isosorbide mononitrate (IMDUR) 120 MG 24 hr tablet; Take 1 tablet (120 mg total) by mouth daily.  Dispense: 90 tablet; Refill: 1  5. History of non-ST elevation myocardial infarction (NSTEMI) In the setting of COVID-19 infection Risk factor modification Follow-up with cardiology  6. Cardiomyopathy, unspecified type (HDunseith EF of 50 to 55% from echo of 09/2021 Euvolemic Currently on beta-blocker, hydralazine, isosorbide Not on an SGLT2 inhibitor due to CKD Continue to follow-up with cardiology  7.  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    Dispense:  3 mL    Refill:  6    Dose increase   isosorbide mononitrate (IMDUR) 120 MG 24 hr tablet    Sig: Take 1 tablet (120 mg total) by mouth daily.    Dispense:  90 tablet    Refill:  1    Dose increased   Insulin Glargine (BASAGLAR KWIKPEN) 100 UNIT/ML    Sig: Inject 35 Units into the skin  daily.    Dispense:  12 mL    Refill:  2    Dose decreased    Follow-up: Return in about 1 month (around 10/05/2022) for Blood sugar evaluation with Lurena Joiner.       Charlott Rakes, MD, FAAFP. [facility], [place]   09/05/2022, 5:49 PM

## 2022-09-09 ENCOUNTER — Other Ambulatory Visit: Payer: Self-pay

## 2022-09-12 ENCOUNTER — Other Ambulatory Visit: Payer: Self-pay

## 2022-09-19 ENCOUNTER — Other Ambulatory Visit: Payer: Self-pay

## 2022-09-26 ENCOUNTER — Other Ambulatory Visit (HOSPITAL_BASED_OUTPATIENT_CLINIC_OR_DEPARTMENT_OTHER): Payer: Self-pay

## 2022-09-26 ENCOUNTER — Other Ambulatory Visit: Payer: Self-pay

## 2022-09-26 MED ORDER — TORSEMIDE 20 MG PO TABS
20.0000 mg | ORAL_TABLET | Freq: Every day | ORAL | 3 refills | Status: DC
  Filled 2022-09-26: qty 90, 90d supply, fill #0
  Filled 2022-09-26 – 2022-10-14 (×2): qty 30, 30d supply, fill #0
  Filled 2023-02-13: qty 30, 30d supply, fill #1

## 2022-09-29 ENCOUNTER — Other Ambulatory Visit: Payer: Self-pay

## 2022-10-03 ENCOUNTER — Other Ambulatory Visit: Payer: Self-pay

## 2022-10-04 ENCOUNTER — Other Ambulatory Visit: Payer: Self-pay

## 2022-10-05 ENCOUNTER — Other Ambulatory Visit: Payer: Self-pay

## 2022-10-06 ENCOUNTER — Other Ambulatory Visit: Payer: Self-pay

## 2022-10-07 ENCOUNTER — Ambulatory Visit: Payer: Self-pay | Admitting: Pharmacist

## 2022-10-14 ENCOUNTER — Other Ambulatory Visit: Payer: Self-pay | Admitting: Family Medicine

## 2022-10-14 ENCOUNTER — Other Ambulatory Visit: Payer: Self-pay

## 2022-10-14 MED ORDER — INSULIN LISPRO (1 UNIT DIAL) 100 UNIT/ML (KWIKPEN)
8.0000 [IU] | PEN_INJECTOR | Freq: Three times a day (TID) | SUBCUTANEOUS | 2 refills | Status: DC
  Filled 2022-10-14: qty 6, 25d supply, fill #0
  Filled 2023-02-13: qty 12, 50d supply, fill #1

## 2022-10-14 NOTE — Telephone Encounter (Signed)
Requested Prescriptions  Pending Prescriptions Disp Refills   insulin lispro (HUMALOG KWIKPEN) 100 UNIT/ML KwikPen 6 mL 2    Sig: Inject 8 Units into the skin 3 (three) times daily.     Endocrinology:  Diabetes - Insulins Passed - 10/14/2022  9:16 AM      Passed - HBA1C is between 0 and 7.9 and within 180 days    HbA1c, POC (controlled diabetic range)  Date Value Ref Range Status  09/05/2022 5.9 0.0 - 7.0 % Final         Passed - Valid encounter within last 6 months    Recent Outpatient Visits           1 month ago Type 2 diabetes mellitus with hyperglycemia, unspecified whether long term insulin use ([place])   [place], [place], MD   1 month ago Type 2 diabetes mellitus with hyperglycemia, unspecified whether long term insulin use [facility])   [place], Annie Main L, RPH-CPP   3 months ago Essential hypertension   [place], Annie Main L, RPH-CPP   4 months ago Type 2 diabetes mellitus with hyperglycemia, unspecified whether long term insulin use [facility])   [place], [place], [place]   1 year ago Type 2 diabetes mellitus with foot ulcer, with long-term current use of insulin [facility])   [place], [place], MD       Future Appointments             In 1 month Daisy Blossom, Jarome [place], [place]

## 2022-10-19 ENCOUNTER — Other Ambulatory Visit: Payer: Self-pay

## 2022-11-01 ENCOUNTER — Emergency Department
Admission: EM | Admit: 2022-11-01 | Discharge: 2022-11-01 | Disposition: A | Discharge status: Home / Self Care | Payer: Self-pay | Attending: Emergency Medicine | Admitting: Emergency Medicine

## 2022-11-01 ENCOUNTER — Other Ambulatory Visit: Payer: Self-pay

## 2022-11-01 ENCOUNTER — Encounter: Payer: Self-pay | Admitting: Emergency Medicine

## 2022-11-01 DIAGNOSIS — J029 Acute pharyngitis, unspecified: Secondary | ICD-10-CM | POA: Insufficient documentation

## 2022-11-01 DIAGNOSIS — Z20822 Contact with and (suspected) exposure to covid-19: Secondary | ICD-10-CM | POA: Insufficient documentation

## 2022-11-01 DIAGNOSIS — R112 Nausea with vomiting, unspecified: Secondary | ICD-10-CM | POA: Insufficient documentation

## 2022-11-01 LAB — COMPREHENSIVE METABOLIC PANEL
ALT: 14 U/L (ref 0–44)
AST: 14 U/L — ABNORMAL LOW (ref 15–41)
Albumin: 3.1 g/dL — ABNORMAL LOW (ref 3.5–5.0)
Alkaline Phosphatase: 109 U/L (ref 38–126)
Anion gap: 6 (ref 5–15)
BUN: 16 mg/dL (ref 6–20)
CO2: 25 mmol/L (ref 22–32)
Calcium: 8.8 mg/dL — ABNORMAL LOW (ref 8.9–10.3)
Chloride: 116 mmol/L — ABNORMAL HIGH (ref 98–111)
Creatinine, Ser: 2.51 mg/dL — ABNORMAL HIGH (ref 0.61–1.24)
GFR, Estimated: 34 mL/min — ABNORMAL LOW (ref >60)
Glucose, Bld: 121 mg/dL — ABNORMAL HIGH (ref 70–99)
Potassium: 4 mmol/L (ref 3.5–5.1)
Sodium: 147 mmol/L — ABNORMAL HIGH (ref 135–145)
Total Bilirubin: 0.6 mg/dL (ref 0.3–1.2)
Total Protein: 7.1 g/dL (ref 6.5–8.1)

## 2022-11-01 LAB — CBC
HCT: 40.6 % (ref 39.0–52.0)
Hemoglobin: 11.8 g/dL — ABNORMAL LOW (ref 13.0–17.0)
MCH: 23.6 pg — ABNORMAL LOW (ref 26.0–34.0)
MCHC: 29.1 g/dL — ABNORMAL LOW (ref 30.0–36.0)
MCV: 81.2 fL (ref 80.0–100.0)
Platelets: 425 10*3/uL — ABNORMAL HIGH (ref 150–400)
RBC: 5 MIL/uL (ref 4.22–5.81)
RDW: 14.7 % (ref 11.5–15.5)
WBC: 8.8 10*3/uL (ref 4.0–10.5)
nRBC: 0 % (ref 0.0–0.2)

## 2022-11-01 LAB — RESP PANEL BY RT-PCR (FLU A&B, COVID) ARPGX2
Influenza A by PCR: NEGATIVE
Influenza B by PCR: NEGATIVE
SARS Coronavirus 2 by RT PCR: NEGATIVE

## 2022-11-01 LAB — LIPASE, BLOOD: Lipase: 30 U/L (ref 11–51)

## 2022-11-01 LAB — CBG MONITORING, ED: Glucose-Capillary: 115 mg/dL — ABNORMAL HIGH (ref 70–99)

## 2022-11-01 MED ORDER — ONDANSETRON HCL 4 MG PO TABS
4.0000 mg | ORAL_TABLET | Freq: Three times a day (TID) | ORAL | 0 refills | Status: DC | PRN
  Filled 2022-11-01: qty 20, 7d supply, fill #0

## 2022-11-01 MED ORDER — ONDANSETRON 4 MG PO TBDP
4.0000 mg | ORAL_TABLET | Freq: Once | ORAL | Status: AC | PRN
  Administered 2022-11-01: 4 mg via ORAL
  Filled 2022-11-01: qty 1

## 2022-11-01 MED ORDER — LACTATED RINGERS IV BOLUS
1000.0000 mL | Freq: Once | INTRAVENOUS | Status: AC
  Administered 2022-11-01: 1000 mL via INTRAVENOUS

## 2022-11-01 MED ORDER — ONDANSETRON HCL 4 MG/2ML IJ SOLN
4.0000 mg | Freq: Once | INTRAMUSCULAR | Status: AC
  Administered 2022-11-01: 4 mg via INTRAVENOUS
  Filled 2022-11-01: qty 2

## 2022-11-01 NOTE — Discharge Instructions (Signed)
Please seek medical attention for any high fevers, chest pain, shortness of breath, change in behavior, persistent vomiting, bloody stool or any other new or concerning symptoms.  

## 2022-11-01 NOTE — ED Notes (Addendum)
Pt A&Ox4. Pt been having abdominal pain in medial part of the stomach that has been ongoing for about 3-4 days. Pt has not been eating or drinking for approximately a week and a half. Has been experiencing N/V and lightheaded/dizzy but no diarrhea. Pt states pain is about a 2-3 currently.

## 2022-11-01 NOTE — ED Triage Notes (Signed)
Patient arrives ambulatory by POV c/o feeling sick over the past week and half. Patient reports nausea and vomiting. Mother states patient unable to keep meds down. C/o sore throat from emesis.

## 2022-11-01 NOTE — ED Provider Notes (Signed)
[facility] Provider Note    Event Date/Time   First MD Initiated Contact with Patient 11/01/22 1609     (approximate)   History   Emesis   HPI  Erik [name] is a 31 y.o. male  who presents to the emergency department today because of concern for nausea and vomiting. Patient states that symptoms have been present for the past week and a half. Did have a covid exposure prior to symptoms starting. Has also had sore throat and feels like the vomiting has caused his throat to be raw and he has noticed some blood in his vomit. Denies any shortness of breath or chest pain.      Physical Exam   Triage Vital Signs: ED Triage Vitals [11/01/22 1548]  Enc Vitals Group     BP (!) 186/122     Pulse Rate (!) 109     Resp 18     Temp 98.4 F (36.9 C)     Temp Source Oral     SpO2 97 %     Weight (!) 476 lb (215.9 kg)     Height 5\' 10"  (1.778 m)     Head Circumference      Peak Flow      Pain Score 4     Pain Loc      Pain Edu?      Excl. in [place]?     Most recent vital signs: Vitals:   11/01/22 1548  BP: (!) 186/122  Pulse: (!) 109  Resp: 18  Temp: 98.4 F (36.9 C)  SpO2: 97%    General: Awake, alert, oriented. CV:  Good peripheral perfusion. Regular rate and rhythm. Resp:  Normal effort. Lungs clear. Abd:  No distention.    ED Results / Procedures / Treatments   Labs (all labs ordered are listed, but only abnormal results are displayed) Labs Reviewed  CBC - Abnormal; Notable for the following components:      Result Value   Hemoglobin 11.8 (*)    MCH 23.6 (*)    MCHC 29.1 (*)    Platelets 425 (*)    All other components within normal limits  CBG MONITORING, ED - Abnormal; Notable for the following components:   Glucose-Capillary 115 (*)    All other components within normal limits  LIPASE, BLOOD  COMPREHENSIVE METABOLIC PANEL  URINALYSIS, ROUTINE W REFLEX MICROSCOPIC      EKG  None   RADIOLOGY None   PROCEDURES:  Critical Care performed: No  Procedures   MEDICATIONS ORDERED IN ED: Medications  ondansetron (ZOFRAN-ODT) disintegrating tablet 4 mg (4 mg Oral Given 11/01/22 1559)     IMPRESSION / MDM / ASSESSMENT AND PLAN / ED COURSE  I reviewed the triage vital signs and the nursing notes.                              Differential diagnosis includes, but is not limited to, COVID, influenza, gastroenteritis.  Patient's presentation is most consistent with acute presentation with potential threat to life or bodily function.  Patient presents to the emergency department today after a week and a half of nausea and vomiting after a covid exposure. Blood work here with slight elevation over baseline creatinine. No concerning glucose elevation or leukocytosis. COVID test was negative. Patient was given IV fluids and nausea medication. Did feel better here in the emergency department. Will plan on discharging home with  prescription for nausea medication. Additional encouraged patient to follow up with his primary care physician for further blood pressure management.   FINAL CLINICAL IMPRESSION(S) / ED DIAGNOSES   Final diagnoses:  Nausea and vomiting, unspecified vomiting type     Note:  This document was prepared using Dragon voice recognition software and may include unintentional dictation errors.    Phineas Semen, MD 11/01/22 [phone]

## 2022-11-02 ENCOUNTER — Other Ambulatory Visit: Payer: Self-pay

## 2022-11-02 ENCOUNTER — Other Ambulatory Visit: Payer: Self-pay | Admitting: Family Medicine

## 2022-11-02 MED ORDER — TRUE METRIX BLOOD GLUCOSE TEST VI STRP
ORAL_STRIP | 0 refills | Status: AC
  Filled 2022-11-02: qty 100, 25d supply, fill #0

## 2022-11-14 ENCOUNTER — Other Ambulatory Visit: Payer: Self-pay

## 2022-11-14 ENCOUNTER — Ambulatory Visit: Payer: Self-pay | Attending: Family Medicine | Admitting: Pharmacist

## 2022-11-14 ENCOUNTER — Encounter: Payer: Self-pay | Admitting: Pharmacist

## 2022-11-14 DIAGNOSIS — E1165 Type 2 diabetes mellitus with hyperglycemia: Secondary | ICD-10-CM

## 2022-11-14 MED ORDER — BASAGLAR KWIKPEN 100 UNIT/ML ~~LOC~~ SOPN
30.0000 [IU] | PEN_INJECTOR | Freq: Every day | SUBCUTANEOUS | 2 refills | Status: DC
  Filled 2022-11-14: qty 9, 30d supply, fill #0

## 2022-11-14 MED ORDER — SEMAGLUTIDE (1 MG/DOSE) 4 MG/3ML ~~LOC~~ SOPN
1.0000 mg | PEN_INJECTOR | SUBCUTANEOUS | 2 refills | Status: DC
  Filled 2022-11-14 – 2022-11-25 (×3): qty 3, 28d supply, fill #0

## 2022-11-14 NOTE — Progress Notes (Signed)
    S:    Chief Complaint  Patient presents with   Medication Management    Diabetes   31 y.o. male who presents for diabetes evaluation, education, and management. 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  Patient was referred and last seen by Primary Care Provider, Dr. Alvis Lemmings, on 09/05/2022.   At last visit, A1c was down to 5.9%. Ozempic was increased to 0.5 mg weekly and Basaglar was reduced from 39 to 35 units.   Today, patient arrives in good spirits and presents without any assistance. Denies GI upset with Ozempic. He has not noticed much difference in his diet or weight. States a goal weight of 280s.   Patient reports diabetes was diagnosed in 2022 after being hospitalized for ARDS 2/2 severe COVID-19 infection. He was diagnosed with HF, DM, and CKD at that time.    Current diabetes medications include: Basaglar 36 units, Humalog 8 units TID, Ozempic 0.5 mg weekly (Saturdays)  Patient reports adherence to taking all medications as prescribed.   Insurance coverage: none - provided information on how to apply for Medicaid today   Patient denies hypoglycemic events.  Reported home fasting blood sugars: 100-120 mg/dL.   Reported 2 hour post-meal/random blood sugars: not checking.  Patient denies nocturia (nighttime urination).  Patient denies neuropathy (nerve pain). Patient denies visual changes. Patient denies self foot exams.   O:   Lab Results  Component Value Date   HGBA1C 5.9 09/05/2022   There were no vitals filed for this visit.  Lipid Panel     Component Value Date/Time   CHOL 174 05/18/2022 1045   TRIG 94 05/18/2022 1045   HDL 49 05/18/2022 1045   CHOLHDL 3.6 05/18/2022 1045   CHOLHDL 3.1 01/27/2021 1105   VLDL 46 (H) 01/27/2021 1105   LDLCALC 108 (H) 05/18/2022 1045    Clinical Atherosclerotic Cardiovascular  Disease (ASCVD): Yes  The ASCVD Risk score (Arnett DK, et al., 2019) failed to calculate for the following reasons:   The 2019 ASCVD risk score is only valid for ages 17 to 74   The patient has a prior MI or stroke diagnosis   A/P: Diabetes longstanding, currently controlled based on A1c. Patient is able to verbalize appropriate hypoglycemia management plan. Medication adherence appears appropriate.  -Decreased Basaglar to 30 units daily.  -Continued Humalog 8 units TID.  -Increased dose of Ozempic to 1 mg weekly.   -Per nephrology, once renal function stabilizes, SGLT2i can be considered.  -Patient educated on purpose, proper use, and potential adverse effects of Ozempic.  -Provided patient paperwork on how to apply for Medicaid.  -Extensively discussed pathophysiology of diabetes, recommended lifestyle interventions, dietary effects on blood sugar control.  -Counseled on s/sx of and management of hypoglycemia.  -Next A1c anticipated March 2024.   Written patient instructions provided. Patient verbalized understanding of treatment plan.  Total time in face to face counseling 20 minutes.    Follow-up:  Pharmacist 12/23/2022. Nephrology visit on 02/06/2023.   Valeda Malm, Pharm.D. PGY-2 Ambulatory Care Pharmacy Resident 11/14/2022 2:25 PM

## 2022-11-16 ENCOUNTER — Other Ambulatory Visit: Payer: Self-pay

## 2022-11-25 ENCOUNTER — Other Ambulatory Visit: Payer: Self-pay | Admitting: Pharmacist

## 2022-11-25 ENCOUNTER — Other Ambulatory Visit: Payer: Self-pay

## 2022-11-25 MED ORDER — OZEMPIC (0.25 OR 0.5 MG/DOSE) 2 MG/3ML ~~LOC~~ SOPN
0.5000 mg | PEN_INJECTOR | SUBCUTANEOUS | 1 refills | Status: DC
  Filled 2022-11-25: qty 3, 28d supply, fill #0

## 2022-12-23 ENCOUNTER — Ambulatory Visit: Payer: Self-pay | Attending: Family Medicine | Admitting: Pharmacist

## 2022-12-23 ENCOUNTER — Other Ambulatory Visit: Payer: Self-pay

## 2022-12-23 DIAGNOSIS — E1165 Type 2 diabetes mellitus with hyperglycemia: Secondary | ICD-10-CM

## 2022-12-23 MED ORDER — BASAGLAR KWIKPEN 100 UNIT/ML ~~LOC~~ SOPN
24.0000 [IU] | PEN_INJECTOR | Freq: Every day | SUBCUTANEOUS | 3 refills | Status: DC
  Filled 2022-12-23: qty 9, 37d supply, fill #0
  Filled 2023-02-13: qty 12, 50d supply, fill #1

## 2022-12-23 MED ORDER — SEMAGLUTIDE (2 MG/DOSE) 8 MG/3ML ~~LOC~~ SOPN
2.0000 mg | PEN_INJECTOR | SUBCUTANEOUS | 3 refills | Status: DC
  Filled 2022-12-23: qty 3, 28d supply, fill #0
  Filled 2023-02-13: qty 9, 84d supply, fill #1

## 2022-12-23 NOTE — Progress Notes (Signed)
    S:    No chief complaint on file.  32 y.o. male who presents for diabetes evaluation, education, and management. 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  Patient was referred and last seen by Primary Care Provider, Dr. Margarita Rana, on 09/05/2022. We saw him on 11/14/2022 and increased Ozempic. We also decreased his insulin dose.   Today, patient arrives in good spirits and presents without any assistance. Denies GI upset with Ozempic. He does endorse decreased appetite since increasing to the 1 mg weekly dose.   Current diabetes medications include: Basaglar 30 units, Humalog 8 units TID, Ozempic 1 mg weekly (Saturdays)  Patient reports adherence to taking all medications as prescribed.   Insurance coverage: none - provided information on how to apply for Medicaid today   Patient denies hypoglycemic events.  Reported home fasting blood sugars: 100-120 mg/dL.   Reported 2 hour post-meal/random blood sugars: same range as above.  Patient denies nocturia (nighttime urination).  Patient denies neuropathy (nerve pain). Patient denies visual changes. Patient denies self foot exams.   O:   Lab Results  Component Value Date   HGBA1C 5.9 09/05/2022   There were no vitals filed for this visit.  Lipid Panel     Component Value Date/Time   CHOL 174 05/18/2022 1045   TRIG 94 05/18/2022 1045   HDL 49 05/18/2022 1045   CHOLHDL 3.6 05/18/2022 1045   CHOLHDL 3.1 01/27/2021 1105   VLDL 46 (H) 01/27/2021 1105   LDLCALC 108 (H) 05/18/2022 1045    Clinical Atherosclerotic Cardiovascular Disease (ASCVD): Yes  The ASCVD Risk score (Arnett DK, et al., 2019) failed to calculate for the following reasons:   The 2019 ASCVD risk score is only valid for ages 21 to 46   The patient has a prior MI or stroke diagnosis   A/P: Diabetes longstanding, currently  controlled based on A1c. Patient is able to verbalize appropriate hypoglycemia management plan. Medication adherence appears appropriate.  -Decreased Basaglar to 24 units daily.  -Continued Humalog 8 units TID.  -Increased dose of Ozempic to 1 mg weekly.   -Per nephrology, once renal function stabilizes, SGLT2i can be considered.  -Patient educated on purpose, proper use, and potential adverse effects of Ozempic.  -Extensively discussed pathophysiology of diabetes, recommended lifestyle interventions, dietary effects on blood sugar control.  -Counseled on s/sx of and management of hypoglycemia.  -Next A1c anticipated March 2024.   Written patient instructions provided. Patient verbalized understanding of treatment plan.  Total time in face to face counseling 20 minutes.    Follow-up:  PCP: 01/25/2023 Nephrology visit on 02/06/2023.   Benard Halsted, PharmD, [place], [place] [phone]

## 2022-12-26 ENCOUNTER — Other Ambulatory Visit: Payer: Self-pay

## 2022-12-30 ENCOUNTER — Other Ambulatory Visit: Payer: Self-pay

## 2023-01-03 ENCOUNTER — Other Ambulatory Visit: Payer: Self-pay

## 2023-01-06 ENCOUNTER — Other Ambulatory Visit: Payer: Self-pay

## 2023-01-10 ENCOUNTER — Other Ambulatory Visit: Payer: Self-pay

## 2023-01-19 ENCOUNTER — Telehealth: Payer: Self-pay | Admitting: Pharmacist

## 2023-01-19 NOTE — Progress Notes (Signed)
Patient attempted to be outreached by Junius Finner, PharmD Candidate on 01/19/2023 to discuss hypertension. Left voicemail for patient to return our call at their convenience at [phone].   [place] [facility] Pharm[facility]  PharmD Candidate 2024   Catie Hedwig Morton, PharmD, [place], [facility] [phone]

## 2023-01-25 ENCOUNTER — Ambulatory Visit: Payer: Self-pay | Admitting: Family Medicine

## 2023-01-26 ENCOUNTER — Telehealth: Payer: Self-pay

## 2023-01-26 NOTE — Telephone Encounter (Signed)
Patient attempted to be outreached by Junius Finner, PharmD Candidate on 01/26/2023 to discuss hypertension. Left voicemail for patient to return our call at their convenience at [phone].   [place] [facility] Pharm[facility]  PharmD Candidate 2024   Maryan Puls, PharmD PGY-1 [facility] Pharmacy Resident

## 2023-01-31 NOTE — Telephone Encounter (Signed)
Patient attempted to be outreached by Donney Rankins, PharmD Candidate on 02/20 to discuss hypertension. Left voicemail for patient to return our call at their convenience at [phone].   Donney Rankins, PharmD Candidate    Maryan Puls, PharmD PGY-1 [facility] Pharmacy Resident

## 2023-02-13 ENCOUNTER — Other Ambulatory Visit: Payer: Self-pay | Admitting: Pharmacist

## 2023-02-13 ENCOUNTER — Other Ambulatory Visit: Payer: Self-pay

## 2023-02-13 NOTE — Progress Notes (Signed)
Patient appearing on report for [employer] - Hypertension Control report due to last documented ambulatory blood pressure of 154/94 on 09/05/22. Next appointment with PCP is not scheduled - missed 01/25/23 appointment  Outreached patient to discuss hypertension control and medication management.   Current antihypertensives: carvedilol 25 mg twice daily, hydralazine 100 mg three times daily, isosorbide ER 120 mg daily  Patient has an automated upper arm home BP machine.  Current blood pressure readings: not checking, notes he needs new batteries.   Assessment/Plan: - Currently uncontrolled - - Reviewed goal blood pressure <130/80 - Reviewed appropriate administration of medication regimen - Recommend to reschedule follow up. Assisted with scheduling follow up with PCP in next available and with [facility] in next available.   Denies transportation concerns. Requests refills on all medications, will collaborate with the pharmacy team to do so.   Counseled to restart checking home BP and home glucose readings prior to upcoming appointment with [facility].   Catie Hedwig Morton, PharmD, [place], [facility] [phone]

## 2023-02-15 ENCOUNTER — Other Ambulatory Visit: Payer: Self-pay

## 2023-02-16 ENCOUNTER — Telehealth: Payer: Self-pay

## 2023-02-16 NOTE — Progress Notes (Signed)
Telephone call to patient documented in another encounter

## 2023-02-16 NOTE — Telephone Encounter (Signed)
I spoke to patient at the request of Catie Jodi Mourning, [facility].  He explained that he is currently unemployed and uninsured. He said he applied for Medicaid about 2 years ago and was denied. I explained to him that the qualifications changed on 11/11/2022 so he may qualify now.  I offered to refer him to [employer] [facility])  for assistance with applying and he said he would rather try applying for himself first.     I provided him with the website: e[employer] and explained that he would set up an account and then would be able to apply on line. I also told him that he can go to his local DSS or I can mail him an application or he can call me back and I can refer him to [facility] if he needs additional assistance.  He said he would like to try applying on line  on his own.   I explained to him that [facility] offers financial assistance but he would need a denial from Medicaid in order to apply for that and he said he understood

## 2023-02-17 ENCOUNTER — Other Ambulatory Visit: Payer: Self-pay

## 2023-03-17 ENCOUNTER — Telehealth: Payer: Self-pay | Admitting: Pharmacist

## 2023-03-17 ENCOUNTER — Other Ambulatory Visit: Payer: Self-pay

## 2023-03-17 ENCOUNTER — Encounter: Payer: Self-pay | Admitting: Pharmacist

## 2023-03-17 ENCOUNTER — Ambulatory Visit: Payer: Self-pay | Attending: Family Medicine | Admitting: Pharmacist

## 2023-03-17 VITALS — BP 116/75 | HR 96

## 2023-03-17 DIAGNOSIS — E1165 Type 2 diabetes mellitus with hyperglycemia: Secondary | ICD-10-CM

## 2023-03-17 DIAGNOSIS — I10 Essential (primary) hypertension: Secondary | ICD-10-CM

## 2023-03-17 LAB — POCT GLYCOSYLATED HEMOGLOBIN (HGB A1C): HbA1c, POC (controlled diabetic range): 5.8 % (ref 0.0–7.0)

## 2023-03-17 MED ORDER — BASAGLAR KWIKPEN 100 UNIT/ML ~~LOC~~ SOPN
16.0000 [IU] | PEN_INJECTOR | Freq: Every day | SUBCUTANEOUS | 2 refills | Status: DC
  Filled 2023-03-17: qty 6, 37d supply, fill #0

## 2023-03-17 NOTE — Progress Notes (Signed)
    S:    No chief complaint on file.  32 y.o. male who presents for diabetes evaluation, education, and management. 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  Patient was referred and last seen by Primary Care Provider, Dr. Alvis Lemmings, on 09/05/2022. We saw him on 12/23/2022 and increased Ozempic. We also decreased his insulin dose.   Today, patient arrives in good spirits and presents without any assistance. Endorses bloating and belching with Ozempic and is taking Tums to help with GI symptoms.    Current diabetes medications include: Basaglar 16 units, Humalog 8 units TID, Ozempic 2 mg weekly (Saturdays)  Current hypertension medications include: carvedilol 25 mg BID, hydralazine 100 mg TID, isosorbide mononitrate 120 mg QD  Patient reports has been missing doses of medications, but did take all medications this morning. Of note, due to arm circumference took blood pressure at radial pulse.    Insurance coverage: none - has information on how to apply for Medicaid   Patient denies hypoglycemic events.  Reported home fasting blood sugars: 80-130 mg/dL.   Reported 2 hour post-meal/random blood sugars: same range as above.  Patient denies nocturia (nighttime urination).  Patient denies neuropathy (nerve pain). Patient denies visual changes. Patient denies self foot exams.   O:   Lab Results  Component Value Date   HGBA1C 5.8 03/17/2023   Today's Vitals   03/17/23 1153 03/17/23 1154  BP: 106/70 116/75  Pulse: 96 96   There is no height or weight on file to calculate BMI.  Lipid Panel     Component Value Date/Time   CHOL 174 05/18/2022 1045   TRIG 94 05/18/2022 1045   HDL 49 05/18/2022 1045   CHOLHDL 3.6 05/18/2022 1045   CHOLHDL 3.1 01/27/2021 1105   VLDL 46 (H) 01/27/2021 1105   LDLCALC 108 (H) 05/18/2022 1045   Clinical  Atherosclerotic Cardiovascular Disease (ASCVD): Yes  The ASCVD Risk score (Arnett DK, et al., 2019) failed to calculate for the following reasons:   The 2019 ASCVD risk score is only valid for ages 45 to 44   The patient has a prior MI or stroke diagnosis   A/P: Diabetes longstanding, currently controlled based on A1c. Patient is able to verbalize appropriate hypoglycemia management plan. Medication adherence appears appropriate.  -Continued Basaglar to 16 units daily.  -Continued Humalog 8 units TID.  -Continued Ozempic to 2 mg weekly.   -Per nephrology, once renal function stabilizes, SGLT2i can be considered.  -Patient educated on purpose, proper use, and potential adverse effects of Ozempic.  -Extensively discussed pathophysiology of diabetes, recommended lifestyle interventions, dietary effects on blood sugar control.  -Counseled on s/sx of and management of hypoglycemia.  -Next A1c anticipated in 06/2023.  Hypertension controlled with current blood pressure regimen. -Continued carvedilol, hydralazine, and isosorbide mononitrate at current doses.  -Encouraged patient to monitor blood pressure at home.   Written patient instructions provided. Patient verbalized understanding of treatment plan.  Total time in face to face counseling 20 minutes.    Follow-up:  PCP on 05/24/2023  Georga Hacking, PharmD Clinical Pharmacist [facility] [phone]

## 2023-03-17 NOTE — Progress Notes (Signed)
Patient attempted to be outreached by Clovis Pu, PharmD Candidate on 4/5 to discuss hypertension. Left voicemail for patient to return our call at their convenience at [phone].  Clovis Pu, PharmD Candidate

## 2023-03-29 ENCOUNTER — Other Ambulatory Visit
Admission: RE | Admit: 2023-03-29 | Discharge: 2023-03-29 | Disposition: A | Discharge status: Home / Self Care | Payer: Self-pay | Source: Ambulatory Visit | Attending: Nephrology | Admitting: Nephrology

## 2023-03-29 DIAGNOSIS — I10 Essential (primary) hypertension: Secondary | ICD-10-CM | POA: Insufficient documentation

## 2023-03-29 DIAGNOSIS — R600 Localized edema: Secondary | ICD-10-CM | POA: Insufficient documentation

## 2023-03-29 DIAGNOSIS — E1129 Type 2 diabetes mellitus with other diabetic kidney complication: Secondary | ICD-10-CM | POA: Insufficient documentation

## 2023-03-29 DIAGNOSIS — N1832 Chronic kidney disease, stage 3b: Secondary | ICD-10-CM | POA: Insufficient documentation

## 2023-03-29 LAB — RENAL FUNCTION PANEL
Albumin: 2.9 g/dL — ABNORMAL LOW (ref 3.5–5.0)
Anion gap: 5 (ref 5–15)
BUN: 30 mg/dL — ABNORMAL HIGH (ref 6–20)
CO2: 22 mmol/L (ref 22–32)
Calcium: 8.5 mg/dL — ABNORMAL LOW (ref 8.9–10.3)
Chloride: 112 mmol/L — ABNORMAL HIGH (ref 98–111)
Creatinine, Ser: 2.71 mg/dL — ABNORMAL HIGH (ref 0.61–1.24)
GFR, Estimated: 31 mL/min — ABNORMAL LOW (ref >60)
Glucose, Bld: 101 mg/dL — ABNORMAL HIGH (ref 70–99)
Phosphorus: 4.4 mg/dL (ref 2.5–4.6)
Potassium: 4.2 mmol/L (ref 3.5–5.1)
Sodium: 139 mmol/L (ref 135–145)

## 2023-03-29 LAB — CBC
HCT: 36.7 % — ABNORMAL LOW (ref 39.0–52.0)
Hemoglobin: 11 g/dL — ABNORMAL LOW (ref 13.0–17.0)
MCH: 24.9 pg — ABNORMAL LOW (ref 26.0–34.0)
MCHC: 30 g/dL (ref 30.0–36.0)
MCV: 83.2 fL (ref 80.0–100.0)
Platelets: 290 10*3/uL (ref 150–400)
RBC: 4.41 MIL/uL (ref 4.22–5.81)
RDW: 14.1 % (ref 11.5–15.5)
WBC: 9.4 10*3/uL (ref 4.0–10.5)
nRBC: 0 % (ref 0.0–0.2)

## 2023-03-29 LAB — PROTEIN / CREATININE RATIO, URINE
Creatinine, Urine: 111 mg/dL
Protein Creatinine Ratio: 3.77 mg/mg{Cre} — ABNORMAL HIGH (ref 0.00–0.15)
Total Protein, Urine: 419 mg/dL

## 2023-03-30 LAB — PARATHYROID HORMONE, INTACT (NO CA): PTH: 83 pg/mL — ABNORMAL HIGH (ref 15–65)

## 2023-04-17 ENCOUNTER — Other Ambulatory Visit: Payer: Self-pay

## 2023-05-24 ENCOUNTER — Encounter: Payer: Self-pay | Admitting: Family Medicine

## 2023-05-24 ENCOUNTER — Ambulatory Visit: Payer: Self-pay | Attending: Family Medicine | Admitting: Family Medicine

## 2023-05-24 ENCOUNTER — Other Ambulatory Visit: Payer: Self-pay

## 2023-05-24 VITALS — BP 134/84 | HR 89 | Temp 98.2°F | Ht 70.0 in | Wt >= 6400 oz

## 2023-05-24 DIAGNOSIS — E785 Hyperlipidemia, unspecified: Secondary | ICD-10-CM

## 2023-05-24 DIAGNOSIS — Z7985 Long-term (current) use of injectable non-insulin antidiabetic drugs: Secondary | ICD-10-CM

## 2023-05-24 DIAGNOSIS — I10 Essential (primary) hypertension: Secondary | ICD-10-CM

## 2023-05-24 DIAGNOSIS — N1832 Chronic kidney disease, stage 3b: Secondary | ICD-10-CM

## 2023-05-24 DIAGNOSIS — Z794 Long term (current) use of insulin: Secondary | ICD-10-CM

## 2023-05-24 DIAGNOSIS — D631 Anemia in chronic kidney disease: Secondary | ICD-10-CM

## 2023-05-24 DIAGNOSIS — E1165 Type 2 diabetes mellitus with hyperglycemia: Secondary | ICD-10-CM

## 2023-05-24 DIAGNOSIS — K219 Gastro-esophageal reflux disease without esophagitis: Secondary | ICD-10-CM

## 2023-05-24 DIAGNOSIS — I5042 Chronic combined systolic (congestive) and diastolic (congestive) heart failure: Secondary | ICD-10-CM

## 2023-05-24 MED ORDER — ISOSORBIDE MONONITRATE ER 120 MG PO TB24
120.0000 mg | ORAL_TABLET | Freq: Every day | ORAL | 1 refills | Status: DC
  Filled 2023-05-24: qty 30, 30d supply, fill #0
  Filled 2023-10-12: qty 30, 30d supply, fill #1

## 2023-05-24 MED ORDER — SEMAGLUTIDE (2 MG/DOSE) 8 MG/3ML ~~LOC~~ SOPN
2.0000 mg | PEN_INJECTOR | SUBCUTANEOUS | 3 refills | Status: DC
  Filled 2023-05-24: qty 12, 112d supply, fill #0

## 2023-05-24 MED ORDER — INSULIN PEN NEEDLE 31G X 6 MM MISC
1.0000 "application " | Freq: Three times a day (TID) | 6 refills | Status: AC
  Filled 2023-05-24: qty 100, 33d supply, fill #0
  Filled 2023-11-28: qty 100, 33d supply, fill #1

## 2023-05-24 MED ORDER — BASAGLAR KWIKPEN 100 UNIT/ML ~~LOC~~ SOPN
16.0000 [IU] | PEN_INJECTOR | Freq: Every day | SUBCUTANEOUS | 2 refills | Status: DC
  Filled 2023-05-24: qty 6, 37d supply, fill #0

## 2023-05-24 MED ORDER — HYDRALAZINE HCL 100 MG PO TABS
100.0000 mg | ORAL_TABLET | Freq: Three times a day (TID) | ORAL | 1 refills | Status: DC
  Filled 2023-05-24: qty 90, 30d supply, fill #0

## 2023-05-24 MED ORDER — POTASSIUM CHLORIDE ER 10 MEQ PO TBCR
10.0000 meq | EXTENDED_RELEASE_TABLET | Freq: Two times a day (BID) | ORAL | 1 refills | Status: DC
  Filled 2023-05-24: qty 60, 30d supply, fill #0

## 2023-05-24 MED ORDER — TORSEMIDE 20 MG PO TABS
20.0000 mg | ORAL_TABLET | Freq: Every day | ORAL | 1 refills | Status: DC
  Filled 2023-05-24: qty 90, 90d supply, fill #0

## 2023-05-24 MED ORDER — ATORVASTATIN CALCIUM 80 MG PO TABS
80.0000 mg | ORAL_TABLET | Freq: Every day | ORAL | 1 refills | Status: DC
  Filled 2023-05-24: qty 90, 90d supply, fill #0

## 2023-05-24 MED ORDER — INSULIN LISPRO (1 UNIT DIAL) 100 UNIT/ML (KWIKPEN)
8.0000 [IU] | PEN_INJECTOR | Freq: Three times a day (TID) | SUBCUTANEOUS | 6 refills | Status: DC
  Filled 2023-05-24: qty 6, 25d supply, fill #0

## 2023-05-24 MED ORDER — FERROUS SULFATE 325 (65 FE) MG PO TABS
325.0000 mg | ORAL_TABLET | Freq: Every day | ORAL | 3 refills | Status: DC
  Filled 2023-05-24: qty 90, 90d supply, fill #0

## 2023-05-24 MED ORDER — CARVEDILOL 25 MG PO TABS
25.0000 mg | ORAL_TABLET | Freq: Two times a day (BID) | ORAL | 1 refills | Status: DC
  Filled 2023-05-24: qty 180, 90d supply, fill #0

## 2023-05-24 MED ORDER — OMEPRAZOLE 40 MG PO CPDR
40.0000 mg | DELAYED_RELEASE_CAPSULE | Freq: Every day | ORAL | 1 refills | Status: DC
  Filled 2023-05-24: qty 90, 90d supply, fill #0
  Filled 2023-10-12: qty 90, 90d supply, fill #1

## 2023-05-24 NOTE — Progress Notes (Signed)
Acid reflux Discuss recent lab results from hospital.

## 2023-05-24 NOTE — Progress Notes (Signed)
Subjective:  Patient ID: Erik [name], male    DOB: 02-Dec-1991  Age: 32 y.o. MRN: [id number]  CC: Gastroesophageal Reflux   HPI Erik [name] is a 32 y.o. year old male with a history of  type 2 diabetes mellitus (A1c 5.8), stage III CKD, history of nonischemic cardiomyopathy (EF of 55% from echo of 09/2021) complicated by VT, STEMI in the setting of COVID-19 pneumonia with ARDS and AKI, possible myocarditis.   Interval History:  He had labs done which were ordered by Nephrology and he has not heard anything about them. Labs revealed worsening Cr of 2.71 up from 2.51 previously, elevated PTH of 83.  He has an upcoming appointment with nephrology next month.  He has reflux which he thinks might be from Ozempic.  He would like to remain on Ozempic though. He has lost 48 lbs in the last 9 months.  Endorses adherence with his insulin and denies presence of hypoglycemia, neuropathy or visual concerns.  He does not exercise regularly.  Past Medical History:  Diagnosis Date   ARDS survivor    a. 01/2021 in setting of COVID PNA.   Asthma    BMI 60.0-69.9, adult (HCC)    Chronic heart failure with preserved ejection fraction (HFpEF) (HCC)    a. 01/2021 LV gram: EF 35-45%; b. 01/2021 Echo: EF 60-65%; c. 09/2021 Echo: EF 55% (51.8% 2D MOD biplane).   CKD (chronic kidney disease), stage II    a. AKI 01/2021 in setting of COVID.   COVID-19 virus infection 01/2021   Diabetes mellitus without complication (HCC)    Microcytic anemia    Myocarditis (HCC)    a. 01/2021 in setting of COVID PNA.   NICM (nonischemic cardiomyopathy) (HCC)    a. 01/2021 LV gram: EF 35-45%; b. 01/2021 Echo: EF 60-65%, no rwma, nl RV fxn; c. 09/2021 Echo: EF 55% (51.8% 2D MOD biplane). No rwma, nl RV fxn w/ mild enlargement.   Non-obstructive CAD (coronary artery disease)    a. 01/2021 Cath: LM nl, LAD nl, RI nl, LCX nl, LPAV mild dzs, RCA nl, RPDA min irregs. EF 35-45%.   Super-super obese (HCC)    a. BMI 63.71 - 11/2021.    Ventricular tachycardia (HCC)    a. 01/2021 in setting of myocarditis/COVID.    Past Surgical History:  Procedure Laterality Date   CARDIAC CATHETERIZATION     LEFT HEART CATH AND CORONARY ANGIOGRAPHY N/A 01/20/2021   Procedure: LEFT HEART CATH AND CORONARY ANGIOGRAPHY;  Surgeon: Yvonne Kendall, MD;  Location: [facility] INVASIVE CV LAB;  Service: Cardiovascular;  Laterality: N/A;    Family History  Problem Relation Age of Onset   Hypertension Mother    Kidney disease Mother    Heart Problems Maternal Grandmother    Heart attack Maternal Grandmother    Heart disease Maternal Grandmother     Social History   Socioeconomic History   Marital status: Single    Spouse name: Not on file   Number of children: Not on file   Years of education: Not on file   Highest education level: Not on file  Occupational History   Not on file  Tobacco Use   Smoking status: Never   Smokeless tobacco: Never  Vaping Use   Vaping Use: Never used  Substance and Sexual Activity   Alcohol use: Never   Drug use: Never   Sexual activity: Never  Other Topics Concern   Not on file  Social History Narrative   Not  on file   Social Determinants of Health   Financial Resource Strain: Low Risk  (03/17/2023)   Overall Financial Resource Strain (CARDIA)    Difficulty of Paying Living Expenses: Not very hard  Food Insecurity: No Food Insecurity (03/17/2023)   Hunger Vital Sign    Worried About Running Out of Food in the Last Year: Never true    Ran Out of Food in the Last Year: Never true  Transportation Needs: No Transportation Needs (03/17/2023)   PRAPARE - Administrator, Civil Service (Medical): No    Lack of Transportation (Non-Medical): No  Physical Activity: Inactive (03/17/2023)   Exercise Vital Sign    Days of Exercise per Week: 0 days    Minutes of Exercise per Session: 0 min  Stress: No Stress Concern Present (03/17/2023)   [employer] of Occupational Health - Occupational Stress  Questionnaire    Feeling of Stress : Only a little  Social Connections: Socially Isolated (03/17/2023)   Social Connection and Isolation Panel [NHANES]    Frequency of Communication with Friends and Family: More than three times a week    Frequency of Social Gatherings with Friends and Family: More than three times a week    Attends Religious Services: Never    Database administrator or Organizations: No    Attends Banker Meetings: Never    Marital Status: Never married    No Known Allergies  Outpatient Medications Prior to Visit  Medication Sig Dispense Refill   aspirin 81 MG chewable tablet Chew 1 tablet (81 mg total) by mouth daily. 100 tablet 0   blood glucose meter kit and supplies KIT Dispense based on patient and insurance preference. Use up to four times daily as directed. 1 each 0   glucose blood (TRUE METRIX BLOOD GLUCOSE TEST) test strip USE UP TO FOUR TIMES DAILY AS DIRECTED 100 strip 0   Multiple Vitamin (MULTIVITAMIN WITH MINERALS) TABS tablet Take 1 tablet by mouth daily.     ondansetron (ZOFRAN) 4 MG tablet Take 1 tablet (4 mg total) by mouth every 8 (eight) hours as needed for nausea or vomiting. 20 tablet 0   atorvastatin (LIPITOR) 80 MG tablet Take 1 tablet (80 mg total) by mouth daily. 90 tablet 2   ferrous sulfate 325 (65 FE) MG tablet Take 1 tablet (325 mg total) by mouth daily with breakfast. 90 tablet 3   hydrALAZINE (APRESOLINE) 100 MG tablet Take 1 tablet (100 mg total) by mouth 3 (three) times daily. 270 tablet 1   Insulin Glargine (BASAGLAR KWIKPEN) 100 UNIT/ML Inject 16 Units into the skin daily. 6 mL 2   insulin lispro (HUMALOG KWIKPEN) 100 UNIT/ML KwikPen Inject 8 Units into the skin 3 (three) times daily. 6 mL 2   Insulin Pen Needle 31G X 6 MM MISC Use as directed with breakfast, with lunch, and with evening meal. 120 each 2   isosorbide mononitrate (IMDUR) 120 MG 24 hr tablet Take 1 tablet (120 mg total) by mouth daily. 90 tablet 1   potassium  chloride (KLOR-CON) 10 MEQ tablet Take 1 tablet (10 mEq total) by mouth 2 (two) times daily. 180 tablet 2   Semaglutide, 2 MG/DOSE, 8 MG/3ML SOPN Inject 2 mg as directed once a week. 3 mL 3   torsemide (DEMADEX) 20 MG tablet Take 1 tablet (20 mg total) by mouth daily. 90 tablet 3   albuterol (VENTOLIN HFA) 108 (90 Base) MCG/ACT inhaler INHALE 1-2 PUFFS INTO THE LUNGS  EVERY SIX HOURS AS NEEDED FOR WHEEZING OR SHORTNESS OF BREATH. 18 g 1   carbamide peroxide (DEBROX) 6.5 % OTIC solution Use 5 drops in each ear the night before your next appt (Patient not taking: Reported on 09/05/2022) 15 mL 0   famotidine (PEPCID) 20 MG tablet TAKE 1 TABLET (20 MG TOTAL) BY MOUTH DAILY. 90 tablet 2   carvedilol (COREG) 25 MG tablet Take 1 tablet (25 mg total) by mouth 2 (two) times daily. 180 tablet 3   No facility-administered medications prior to visit.     ROS Review of Systems  Constitutional:  Negative for activity change and appetite change.  HENT:  Negative for sinus pressure and sore throat.   Respiratory:  Negative for chest tightness, shortness of breath and wheezing.   Cardiovascular:  Negative for chest pain and palpitations.  Gastrointestinal:  Negative for abdominal distention, abdominal pain and constipation.  Genitourinary: Negative.   Musculoskeletal: Negative.   Psychiatric/Behavioral:  Negative for behavioral problems and dysphoric mood.    Objective:  BP 134/84   Pulse 89   Temp 98.2 F (36.8 C) (Oral)   Ht 5\' 10"  (1.778 m)   Wt (!) 428 lb 3.2 oz (194.2 kg)   SpO2 99%   BMI 61.44 kg/m      05/24/2023    9:49 AM 05/24/2023    9:16 AM 03/17/2023   11:54 AM  BP/Weight  Systolic BP 134 149 116  Diastolic BP 84 83 75  Wt. (Lbs)  428.2   BMI  61.44 kg/m2     Wt Readings from Last 3 Encounters:  05/24/23 (!) 428 lb 3.2 oz (194.2 kg)  11/01/22 (!) 476 lb (215.9 kg)  09/05/22 (!) 476 lb 8 oz (216.1 kg)     Physical Exam Constitutional:      Appearance: He is well-developed.  He is obese.  Cardiovascular:     Rate and Rhythm: Normal rate.     Heart sounds: Normal heart sounds. No murmur heard. Pulmonary:     Effort: Pulmonary effort is normal.     Breath sounds: Normal breath sounds. No wheezing or rales.  Chest:     Chest wall: No tenderness.  Abdominal:     General: Bowel sounds are normal. There is no distension.     Palpations: Abdomen is soft. There is no mass.     Tenderness: There is no abdominal tenderness.  Musculoskeletal:        General: Normal range of motion.     Right lower leg: No edema.     Left lower leg: No edema.  Neurological:     Mental Status: He is alert and oriented to person, place, and time.  Psychiatric:        Mood and Affect: Mood normal.    Diabetic Foot Exam - Simple   Simple Foot Form Diabetic Foot exam was performed with the following findings: Yes 05/24/2023  9:46 AM  Visual Inspection No deformities, no ulcerations, no other skin breakdown bilaterally: Yes Sensation Testing Intact to touch and monofilament testing bilaterally: Yes Pulse Check Posterior Tibialis and Dorsalis pulse intact bilaterally: Yes Comments Dry skin on both feet.  Thickened toenails bilaterally        Latest Ref Rng & Units 03/29/2023   10:39 AM 11/01/2022    3:55 PM 09/02/2022   11:03 AM  CMP  Glucose 70 - 99 mg/dL 951  884  89   BUN 6 - 20 mg/dL 30  16  30  Creatinine 0.61 - 1.24 mg/dL 1.61  0.96  0.45   Sodium 135 - 145 mmol/L 139  147  142   Potassium 3.5 - 5.1 mmol/L 4.2  4.0  4.7   Chloride 98 - 111 mmol/L 112  116  109   CO2 22 - 32 mmol/L 22  25  20    Calcium 8.9 - 10.3 mg/dL 8.5  8.8  8.6   Total Protein 6.5 - 8.1 g/dL  7.1  6.2   Total Bilirubin 0.3 - 1.2 mg/dL  0.6  0.3   Alkaline Phos 38 - 126 U/L  109  125   AST 15 - 41 U/L  14  13   ALT 0 - 44 U/L  14  13     Lipid Panel     Component Value Date/Time   CHOL 174 05/18/2022 1045   TRIG 94 05/18/2022 1045   HDL 49 05/18/2022 1045   CHOLHDL 3.6 05/18/2022 1045    CHOLHDL 3.1 01/27/2021 1105   VLDL 46 (H) 01/27/2021 1105   LDLCALC 108 (H) 05/18/2022 1045    CBC    Component Value Date/Time   WBC 9.4 03/29/2023 1039   RBC 4.41 03/29/2023 1039   HGB 11.0 (L) 03/29/2023 1039   HGB 10.3 (L) 05/18/2022 1045   HCT 36.7 (L) 03/29/2023 1039   HCT 34.0 (L) 05/18/2022 1045   PLT 290 03/29/2023 1039   PLT 370 05/18/2022 1045   MCV 83.2 03/29/2023 1039   MCV 79 05/18/2022 1045   MCH 24.9 (L) 03/29/2023 1039   MCHC 30.0 03/29/2023 1039   RDW 14.1 03/29/2023 1039   RDW 14.5 05/18/2022 1045   LYMPHSABS 1.5 05/18/2022 1045   MONOABS 1.4 (H) 03/09/2021 0415   EOSABS 0.3 05/18/2022 1045   BASOSABS 0.1 05/18/2022 1045    Lab Results  Component Value Date   HGBA1C 5.8 03/17/2023    Assessment & Plan:  1. Dyslipidemia LDL is slightly above goal Continue atorvastatin, low-cholesterol diet - atorvastatin (LIPITOR) 80 MG tablet; Take 1 tablet (80 mg total) by mouth daily.  Dispense: 90 tablet; Refill: 1  2. Essential hypertension Controlled Counseled on blood pressure goal of less than 130/80, low-sodium, DASH diet, medication compliance, 150 minutes of moderate intensity exercise per week. Discussed medication compliance, adverse effects. - carvedilol (COREG) 25 MG tablet; Take 1 tablet (25 mg total) by mouth 2 (two) times daily.  Dispense: 180 tablet; Refill: 1 - hydrALAZINE (APRESOLINE) 100 MG tablet; Take 1 tablet (100 mg total) by mouth 3 (three) times daily.  Dispense: 270 tablet; Refill: 1 - isosorbide mononitrate (IMDUR) 120 MG 24 hr tablet; Take 1 tablet (120 mg total) by mouth daily.  Dispense: 90 tablet; Refill: 1 - potassium chloride (KLOR-CON) 10 MEQ tablet; Take 1 tablet (10 mEq total) by mouth 2 (two) times daily.  Dispense: 180 tablet; Refill: 1  3. Chronic combined systolic and diastolic heart failure (HCC) EF 55% Euvolemic Continue medications - carvedilol (COREG) 25 MG tablet; Take 1 tablet (25 mg total) by mouth 2 (two) times  daily.  Dispense: 180 tablet; Refill: 1 - ferrous sulfate 325 (65 FE) MG tablet; Take 1 tablet (325 mg total) by mouth daily with breakfast.  Dispense: 90 tablet; Refill: 3  4. 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  Dispense: 90 tablet; Refill: 3  5. Type 2 diabetes mellitus with hyperglycemia, unspecified whether long term insulin use (HCC) Controlled with A1c of 5.8 He has lost 48 pounds in the last 9 months and has been commended Counseled on Diabetic diet, my plate method, 409 minutes of moderate intensity exercise/week Blood sugar logs with fasting goals of 80-120 mg/dl, random of less than 811 and in the event of sugars less than 60 mg/dl or greater than 914 mg/dl encouraged to notify the clinic. Advised on the need for annual eye exams, annual foot exams, Pneumonia vaccine. - Insulin Glargine (BASAGLAR KWIKPEN) 100 UNIT/ML; Inject 16 Units into the skin daily.  Dispense: 6 mL; Refill: 2 - insulin lispro (HUMALOG KWIKPEN) 100 UNIT/ML KwikPen; Inject 8 Units into the skin 3 (three) times daily.  Dispense: 6 mL; Refill: 6 - potassium chloride (KLOR-CON) 10 MEQ tablet; Take 1 tablet (10 mEq total) by mouth 2 (two) times daily.  Dispense: 180 tablet; Refill: 1 - Semaglutide, 2 MG/DOSE, 8 MG/3ML SOPN; Inject 2 mg as directed once a week.  Dispense: 3 mL; Refill: 3 - Insulin Pen Needle 31G X 6 MM MISC; Use as directed with breakfast, with lunch, and with evening meal.  Dispense: 120 each; Refill: 6  6. Stage 3b chronic kidney disease (HCC) Creatinine is slowly rising -2.71 up from 2.51 I have discussed his labs ordered by his nephrologist with him and he has been advised to contact [facility] to discuss this further. - torsemide (DEMADEX) 20 MG tablet; Take 1 tablet (20 mg total) by mouth daily.  Dispense: 90 tablet; Refill:  1  7. Gastroesophageal reflux disease without esophagitis Uncontrolled He attributes this to Ozempic but would like to remain on Ozempic Initiate PPI Advised to avoid recumbency up to 2 hours postmeal, avoid late meals, avoid foods that trigger symptoms. - omeprazole (PRILOSEC) 40 MG capsule; Take 1 capsule (40 mg total) by mouth daily.  Dispense: 90 capsule; Refill: 1  8. Long term current use of insulin (HCC) Continue insulin at current dose  9. Long-term current use of injectable noninsulin antidiabetic medication Continue Ozempic    Meds ordered this encounter  Medications   omeprazole (PRILOSEC) 40 MG capsule    Sig: Take 1 capsule (40 mg total) by mouth daily.    Dispense:  90 capsule    Refill:  1   atorvastatin (LIPITOR) 80 MG tablet    Sig: Take 1 tablet (80 mg total) by mouth daily.    Dispense:  90 tablet    Refill:  1   carvedilol (COREG) 25 MG tablet    Sig: Take 1 tablet (25 mg total) by mouth 2 (two) times daily.    Dispense:  180 tablet    Refill:  1   ferrous sulfate 325 (65 FE) MG tablet    Sig: Take 1 tablet (325 mg total) by mouth daily with breakfast.    Dispense:  90 tablet    Refill:  3   hydrALAZINE (APRESOLINE) 100 MG tablet    Sig: Take 1 tablet (100 mg total) by mouth 3 (three) times daily.    Dispense:  270 tablet    Refill:  1   Insulin Glargine (BASAGLAR KWIKPEN) 100 UNIT/ML    Sig: Inject 16 Units into the skin daily.    Dispense:  6 mL    Refill:  2    Dose decreased   insulin lispro (HUMALOG KWIKPEN) 100 UNIT/ML KwikPen    Sig: Inject 8 Units into the skin 3 (three) times  daily.    Dispense:  6 mL    Refill:  6   isosorbide mononitrate (IMDUR) 120 MG 24 hr tablet    Sig: Take 1 tablet (120 mg total) by mouth daily.    Dispense:  90 tablet    Refill:  1   potassium chloride (KLOR-CON) 10 MEQ tablet    Sig: Take 1 tablet (10 mEq total) by mouth 2 (two) times daily.    Dispense:  180 tablet    Refill:  1   Semaglutide, 2 MG/DOSE, 8  MG/3ML SOPN    Sig: Inject 2 mg as directed once a week.    Dispense:  3 mL    Refill:  3   torsemide (DEMADEX) 20 MG tablet    Sig: Take 1 tablet (20 mg total) by mouth daily.    Dispense:  90 tablet    Refill:  1   Insulin Pen Needle 31G X 6 MM MISC    Sig: Use as directed with breakfast, with lunch, and with evening meal.    Dispense:  120 each    Refill:  6    Follow-up: Return in about 6 months (around 11/23/2023) for Chronic medical conditions.       Hoy Register, MD, FAAFP. [facility] [place], [place] [phone]   05/24/2023, 11:32 AM

## 2023-05-24 NOTE — Patient Instructions (Signed)

## 2023-07-13 ENCOUNTER — Other Ambulatory Visit: Payer: Self-pay

## 2023-07-13 ENCOUNTER — Other Ambulatory Visit (HOSPITAL_BASED_OUTPATIENT_CLINIC_OR_DEPARTMENT_OTHER): Payer: Self-pay

## 2023-07-13 MED ORDER — ALLOPURINOL 100 MG PO TABS
100.0000 mg | ORAL_TABLET | Freq: Every day | ORAL | 11 refills | Status: AC
  Filled 2023-07-13 – 2023-07-24 (×2): qty 30, 30d supply, fill #0
  Filled 2023-10-12: qty 30, 30d supply, fill #1

## 2023-07-24 ENCOUNTER — Other Ambulatory Visit (HOSPITAL_BASED_OUTPATIENT_CLINIC_OR_DEPARTMENT_OTHER): Payer: Self-pay

## 2023-07-24 ENCOUNTER — Other Ambulatory Visit (HOSPITAL_COMMUNITY): Payer: Self-pay

## 2023-07-24 ENCOUNTER — Other Ambulatory Visit: Payer: Self-pay

## 2023-07-26 ENCOUNTER — Other Ambulatory Visit: Payer: Self-pay

## 2023-10-11 ENCOUNTER — Other Ambulatory Visit (HOSPITAL_BASED_OUTPATIENT_CLINIC_OR_DEPARTMENT_OTHER): Payer: Self-pay

## 2023-10-11 ENCOUNTER — Other Ambulatory Visit: Payer: Self-pay

## 2023-10-11 MED ORDER — CALCITRIOL 0.25 MCG PO CAPS
0.2500 ug | ORAL_CAPSULE | Freq: Every day | ORAL | 11 refills | Status: DC
  Filled 2023-10-11: qty 30, 30d supply, fill #0

## 2023-10-11 MED ORDER — CALCITRIOL 0.25 MCG PO CAPS
0.2500 ug | ORAL_CAPSULE | Freq: Every day | ORAL | 11 refills | Status: AC
  Filled 2023-10-11: qty 30, 30d supply, fill #0
  Filled 2023-12-11: qty 30, 30d supply, fill #1
  Filled 2024-01-11: qty 30, 30d supply, fill #2
  Filled 2024-02-15: qty 30, 30d supply, fill #3
  Filled 2024-03-21: qty 30, 30d supply, fill #4

## 2023-10-12 ENCOUNTER — Other Ambulatory Visit: Payer: Self-pay

## 2023-10-12 ENCOUNTER — Other Ambulatory Visit: Payer: Self-pay | Admitting: Family Medicine

## 2023-10-12 DIAGNOSIS — E1165 Type 2 diabetes mellitus with hyperglycemia: Secondary | ICD-10-CM

## 2023-10-12 MED ORDER — OZEMPIC (2 MG/DOSE) 8 MG/3ML ~~LOC~~ SOPN
2.0000 mg | PEN_INJECTOR | SUBCUTANEOUS | 0 refills | Status: DC
  Filled 2023-10-12 (×2): qty 3, 28d supply, fill #0

## 2023-10-12 NOTE — Telephone Encounter (Signed)
Requested Prescriptions  Pending Prescriptions Disp Refills   Semaglutide, 2 MG/DOSE, (OZEMPIC, 2 MG/DOSE,) 8 MG/3ML SOPN 3 mL 0    Sig: Inject 2 mg as directed once a week.     Endocrinology:  Diabetes - GLP-1 Receptor Agonists - semaglutide Failed - 10/12/2023  9:30 AM      Failed - HBA1C in normal range and within 180 days    HbA1c, POC (controlled diabetic range)  Date Value Ref Range Status  03/17/2023 5.8 0.0 - 7.0 % Final         Failed - Cr in normal range and within 360 days    Creatinine, Ser  Date Value Ref Range Status  03/29/2023 2.71 (H) 0.61 - 1.24 mg/dL Final   Creatinine, Urine  Date Value Ref Range Status  03/29/2023 111 mg/dL Final         Passed - Valid encounter within last 6 months    Recent Outpatient Visits           4 months ago Stage 3b chronic kidney disease (HCC)   [facility] [place], Odette Horns, MD   6 months ago Type 2 diabetes mellitus with hyperglycemia, unspecified whether long term insulin use [facility])   [place] [facility] [place], [place] L, RPH-CPP   9 months ago Type 2 diabetes mellitus with hyperglycemia, unspecified whether long term insulin use [facility])   [place] [facility] [place], [place] L, RPH-CPP   11 months ago Type 2 diabetes mellitus with hyperglycemia, unspecified whether long term insulin use [facility])   [place] [facility] [place], [place] L, RPH-CPP   1 year ago Type 2 diabetes mellitus with hyperglycemia, unspecified whether long term insulin use (HCC)   [place] [facility] Hoy Register, MD       Future Appointments             In 1 month Hoy Register, MD [facility]

## 2023-10-13 ENCOUNTER — Other Ambulatory Visit: Payer: Self-pay

## 2023-10-24 ENCOUNTER — Other Ambulatory Visit: Payer: Self-pay

## 2023-10-24 MED ORDER — DAPAGLIFLOZIN PROPANEDIOL 10 MG PO TABS
10.0000 mg | ORAL_TABLET | Freq: Every morning | ORAL | 11 refills | Status: AC
  Filled 2023-10-24 (×2): qty 30, 30d supply, fill #0
  Filled 2023-12-11: qty 30, 30d supply, fill #1
  Filled 2024-01-11: qty 15, 15d supply, fill #2
  Filled 2024-01-11: qty 30, 30d supply, fill #2

## 2023-10-25 ENCOUNTER — Other Ambulatory Visit: Payer: Self-pay

## 2023-11-20 ENCOUNTER — Other Ambulatory Visit: Payer: Self-pay

## 2023-11-27 ENCOUNTER — Other Ambulatory Visit: Payer: Self-pay

## 2023-11-27 ENCOUNTER — Ambulatory Visit: Payer: Self-pay | Attending: Family Medicine | Admitting: Family Medicine

## 2023-11-27 ENCOUNTER — Encounter: Payer: Self-pay | Admitting: Family Medicine

## 2023-11-27 VITALS — BP 145/87 | HR 101 | Wt >= 6400 oz

## 2023-11-27 DIAGNOSIS — I10 Essential (primary) hypertension: Secondary | ICD-10-CM

## 2023-11-27 DIAGNOSIS — Z5986 Financial insecurity: Secondary | ICD-10-CM

## 2023-11-27 DIAGNOSIS — N1832 Chronic kidney disease, stage 3b: Secondary | ICD-10-CM

## 2023-11-27 DIAGNOSIS — Z23 Encounter for immunization: Secondary | ICD-10-CM

## 2023-11-27 DIAGNOSIS — I5042 Chronic combined systolic (congestive) and diastolic (congestive) heart failure: Secondary | ICD-10-CM

## 2023-11-27 DIAGNOSIS — E785 Hyperlipidemia, unspecified: Secondary | ICD-10-CM

## 2023-11-27 DIAGNOSIS — Z7984 Long term (current) use of oral hypoglycemic drugs: Secondary | ICD-10-CM

## 2023-11-27 DIAGNOSIS — Z7985 Long-term (current) use of injectable non-insulin antidiabetic drugs: Secondary | ICD-10-CM

## 2023-11-27 DIAGNOSIS — Z13228 Encounter for screening for other metabolic disorders: Secondary | ICD-10-CM

## 2023-11-27 DIAGNOSIS — Z139 Encounter for screening, unspecified: Secondary | ICD-10-CM

## 2023-11-27 DIAGNOSIS — Z794 Long term (current) use of insulin: Secondary | ICD-10-CM

## 2023-11-27 DIAGNOSIS — K219 Gastro-esophageal reflux disease without esophagitis: Secondary | ICD-10-CM

## 2023-11-27 DIAGNOSIS — Z5941 Food insecurity: Secondary | ICD-10-CM

## 2023-11-27 DIAGNOSIS — E1165 Type 2 diabetes mellitus with hyperglycemia: Secondary | ICD-10-CM

## 2023-11-27 DIAGNOSIS — D631 Anemia in chronic kidney disease: Secondary | ICD-10-CM

## 2023-11-27 LAB — POCT GLYCOSYLATED HEMOGLOBIN (HGB A1C): HbA1c, POC (controlled diabetic range): 6.6 % (ref 0.0–7.0)

## 2023-11-27 LAB — GLUCOSE, POCT (MANUAL RESULT ENTRY): POC Glucose: 139 mg/dL — AB (ref 70–99)

## 2023-11-27 MED ORDER — ISOSORBIDE MONONITRATE ER 120 MG PO TB24
120.0000 mg | ORAL_TABLET | Freq: Every day | ORAL | 1 refills | Status: AC
  Filled 2023-11-27: qty 30, 30d supply, fill #0
  Filled 2024-01-11: qty 30, 30d supply, fill #1
  Filled 2024-02-15: qty 30, 30d supply, fill #2
  Filled 2024-04-18: qty 30, 30d supply, fill #3

## 2023-11-27 MED ORDER — POTASSIUM CHLORIDE ER 10 MEQ PO TBCR
10.0000 meq | EXTENDED_RELEASE_TABLET | Freq: Two times a day (BID) | ORAL | 1 refills | Status: AC
  Filled 2023-11-27: qty 60, 30d supply, fill #0
  Filled 2024-02-15: qty 60, 30d supply, fill #1

## 2023-11-27 MED ORDER — OZEMPIC (2 MG/DOSE) 8 MG/3ML ~~LOC~~ SOPN
2.0000 mg | PEN_INJECTOR | SUBCUTANEOUS | 6 refills | Status: AC
  Filled 2023-11-27 – 2024-01-12 (×4): qty 3, 28d supply, fill #0
  Filled 2024-02-16: qty 3, 28d supply, fill #1
  Filled 2024-03-21: qty 3, 28d supply, fill #2
  Filled 2024-04-18: qty 3, 28d supply, fill #3
  Filled 2024-06-19: qty 3, 28d supply, fill #4
  Filled 2024-09-24: qty 3, 28d supply, fill #5

## 2023-11-27 MED ORDER — BASAGLAR KWIKPEN 100 UNIT/ML ~~LOC~~ SOPN
16.0000 [IU] | PEN_INJECTOR | Freq: Every day | SUBCUTANEOUS | 2 refills | Status: AC
  Filled 2023-11-27: qty 6, 37d supply, fill #0
  Filled 2024-02-27: qty 6, 37d supply, fill #1
  Filled 2024-06-13: qty 6, 37d supply, fill #2

## 2023-11-27 MED ORDER — ATORVASTATIN CALCIUM 80 MG PO TABS
80.0000 mg | ORAL_TABLET | Freq: Every day | ORAL | 1 refills | Status: DC
  Filled 2023-11-27: qty 90, 90d supply, fill #0
  Filled 2024-03-21: qty 90, 90d supply, fill #1

## 2023-11-27 MED ORDER — HYDRALAZINE HCL 100 MG PO TABS
100.0000 mg | ORAL_TABLET | Freq: Three times a day (TID) | ORAL | 1 refills | Status: AC
  Filled 2023-11-27: qty 90, 30d supply, fill #0
  Filled 2024-03-21: qty 90, 30d supply, fill #1
  Filled 2024-06-13: qty 90, 30d supply, fill #2
  Filled 2024-09-23: qty 90, 30d supply, fill #3

## 2023-11-27 MED ORDER — TORSEMIDE 20 MG PO TABS
20.0000 mg | ORAL_TABLET | Freq: Every day | ORAL | 1 refills | Status: DC
  Filled 2023-11-27: qty 30, 30d supply, fill #0
  Filled 2024-01-11: qty 30, 30d supply, fill #1
  Filled 2024-02-15: qty 30, 30d supply, fill #2
  Filled 2024-03-21: qty 30, 30d supply, fill #3
  Filled 2024-04-18: qty 30, 30d supply, fill #4
  Filled 2024-06-13: qty 30, 30d supply, fill #5

## 2023-11-27 MED ORDER — CARVEDILOL 25 MG PO TABS
25.0000 mg | ORAL_TABLET | Freq: Two times a day (BID) | ORAL | 1 refills | Status: DC
  Filled 2023-11-27: qty 60, 30d supply, fill #0

## 2023-11-27 MED ORDER — OMEPRAZOLE 40 MG PO CPDR
40.0000 mg | DELAYED_RELEASE_CAPSULE | Freq: Every day | ORAL | 1 refills | Status: AC
  Filled 2023-11-27 – 2024-03-21 (×2): qty 90, 90d supply, fill #0
  Filled 2024-09-23: qty 90, 90d supply, fill #1

## 2023-11-27 MED ORDER — INSULIN LISPRO (1 UNIT DIAL) 100 UNIT/ML (KWIKPEN)
8.0000 [IU] | PEN_INJECTOR | Freq: Three times a day (TID) | SUBCUTANEOUS | 6 refills | Status: AC
  Filled 2023-11-27: qty 9, 38d supply, fill #0
  Filled 2024-02-27: qty 9, 38d supply, fill #1
  Filled 2024-06-13: qty 9, 38d supply, fill #2

## 2023-11-27 MED ORDER — FERROUS SULFATE 325 (65 FE) MG PO TABS
325.0000 mg | ORAL_TABLET | Freq: Every day | ORAL | 3 refills | Status: AC
  Filled 2023-11-27: qty 90, 90d supply, fill #0
  Filled 2024-03-21 (×2): qty 90, 90d supply, fill #1
  Filled 2024-09-23: qty 90, 90d supply, fill #2

## 2023-11-27 NOTE — Patient Instructions (Signed)
VISIT SUMMARY:  Today, we reviewed your diabetes management, kidney disease, heart failure, and other health concerns. We discussed your recent A1c levels, medication use, and weight loss. We also addressed your concerns about the medication assistance program and planned for your upcoming specialist appointments.  YOUR PLAN:  -TYPE 2 DIABETES MELLITUS: Your A1c level has increased from 5.8 to 6.6. This means your blood sugar control has worsened slightly. Continue taking Ozempic 2mg  weekly, Lantus 12 units daily, and Humalog 8 units daily. Please ensure you do not run out of Ozempic and discuss with the pharmacy if needed.  -CHRONIC KIDNEY DISEASE, STAGE 3: Chronic kidney disease means your kidneys are not working as well as they should. Continue following the management plan set by your kidney specialist. Your next visit with the nephrologist is scheduled for January.  -HEART FAILURE: Heart failure means your heart is not pumping blood as well as it should. Since you have not seen a cardiologist recently, we will consider referring you for a follow-up with a cardiologist.  -HYPERLIPIDEMIA: Hyperlipidemia means you have high cholesterol levels. We were unable to check your cholesterol today because you had eaten recently. Please reschedule a fasting lipid panel for Wednesday.  -GASTROESOPHAGEAL REFLUX DISEASE: Gastroesophageal reflux disease (GERD) means stomach acid frequently flows back into the tube connecting your mouth and stomach. Continue taking your current medication for acid reflux.  -GOUT: Gout is a form of arthritis characterized by severe pain, redness, and tenderness in joints. You were prescribed Allopurinol by your kidney specialist, but we need to clarify the reason for this medication with them.  -GENERAL HEALTH MAINTENANCE: We will administer your flu shot today and refer you for your annual diabetic eye exam. Please try to stay active, especially on warmer  days.  INSTRUCTIONS:  Please follow up with the pharmacy to ensure you have a refill for Ozempic. Reschedule your fasting lipid panel for Wednesday. Clarify the reason for Allopurinol with your nephrologist. Attend your upcoming appointment with your kidney specialist in January. We will consider a referral for a cardiology follow-up.

## 2023-11-27 NOTE — Progress Notes (Signed)
Subjective:  Patient ID: [id number] POTT, male    DOB: 09-09-1991  Age: 32 y.o. MRN: [id number]  CC: Medical Management of Chronic Issues   HPI Erik [name] is a 32 y.o. year old male with a history of type 2 diabetes mellitus (A1c 6.6), stage III CKD, history of nonischemic cardiomyopathy (EF of 55% from echo of 09/2021) complicated by VT, STEMI in the setting of COVID-19 pneumonia with ARDS and AKI, possible myocarditis.   Interval History: Discussed the use of AI scribe software for clinical note transcription with the patient, who gave verbal consent to proceed.  He presents for a routine follow-up. He reports receiving a letter about errors on his application for a medication assistance program, specifically regarding his Ozempic prescription. He is unsure of the specifics and plans to discuss with the front desk and pharmacy for further guidance.  His most recent A1c is 6.6, up from 5.8 at his last visit. He confirms consistent use of Ozempic, Lantus (12 units, down from previously prescribed 16 units), and Humalog (8 units). He is unsure if he has a refill for Ozempic and plans to confirm with the pharmacy. He has lost approximately 48 pounds since starting Ozempic but then begins in 8 pounds over the last 6 months.   He has an upcoming appointment with his kidney specialist and has not seen his cardiologist recently. He also mentions a recent prescription for allopurinol, prescribed by his kidney specialist, but is unsure of the indication.  The patient confirms he is still taking medication for acid reflux. He has not been able to exercise recently due to cold weather and expresses a dislike for the cold.  From a Cardiac standpoint he is asymptomatic.  He has not seen cardiology recently He is due for an eye exam and plans to get a flu shot during this visit.        Past Medical History:  Diagnosis Date   ARDS survivor    a. 01/2021 in setting of COVID PNA.   Asthma    BMI  60.0-69.9, adult (HCC)    Chronic heart failure with preserved ejection fraction (HFpEF) (HCC)    a. 01/2021 LV gram: EF 35-45%; b. 01/2021 Echo: EF 60-65%; c. 09/2021 Echo: EF 55% (51.8% 2D MOD biplane).   CKD (chronic kidney disease), stage II    a. AKI 01/2021 in setting of COVID.   COVID-19 virus infection 01/2021   Diabetes mellitus without complication (HCC)    Microcytic anemia    Myocarditis (HCC)    a. 01/2021 in setting of COVID PNA.   NICM (nonischemic cardiomyopathy) (HCC)    a. 01/2021 LV gram: EF 35-45%; b. 01/2021 Echo: EF 60-65%, no rwma, nl RV fxn; c. 09/2021 Echo: EF 55% (51.8% 2D MOD biplane). No rwma, nl RV fxn w/ mild enlargement.   Non-obstructive CAD (coronary artery disease)    a. 01/2021 Cath: LM nl, LAD nl, RI nl, LCX nl, LPAV mild dzs, RCA nl, RPDA min irregs. EF 35-45%.   Super-super obese (HCC)    a. BMI 63.71 - 11/2021.   Ventricular tachycardia (HCC)    a. 01/2021 in setting of myocarditis/COVID.    Past Surgical History:  Procedure Laterality Date   CARDIAC CATHETERIZATION     LEFT HEART CATH AND CORONARY ANGIOGRAPHY N/A 01/20/2021   Procedure: LEFT HEART CATH AND CORONARY ANGIOGRAPHY;  Surgeon: Yvonne Kendall, MD;  Location: [facility] INVASIVE CV LAB;  Service: Cardiovascular;  Laterality: N/A;    Family  History  Problem Relation Age of Onset   Hypertension Mother    Kidney disease Mother    Heart Problems Maternal Grandmother    Heart attack Maternal Grandmother    Heart disease Maternal Grandmother     Social History   Socioeconomic History   Marital status: Single    Spouse name: Not on file   Number of children: Not on file   Years of education: Not on file   Highest education level: Not on file  Occupational History   Not on file  Tobacco Use   Smoking status: Never   Smokeless tobacco: Never  Vaping Use   Vaping status: Never Used  Substance and Sexual Activity   Alcohol use: Never   Drug use: Never   Sexual activity: Never  Other  Topics Concern   Not on file  Social History Narrative   Not on file   Social Drivers of Health   Financial Resource Strain: Medium Risk (11/27/2023)   Overall Financial Resource Strain (CARDIA)    Difficulty of Paying Living Expenses: Somewhat hard  Food Insecurity: Food Insecurity Present (11/27/2023)   Hunger Vital Sign    Worried About Running Out of Food in the Last Year: Never true    Ran Out of Food in the Last Year: Sometimes true  Transportation Needs: No Transportation Needs (11/27/2023)   PRAPARE - Administrator, Civil Service (Medical): No    Lack of Transportation (Non-Medical): No  Physical Activity: Inactive (11/27/2023)   Exercise Vital Sign    Days of Exercise per Week: 0 days    Minutes of Exercise per Session: 0 min  Stress: No Stress Concern Present (11/27/2023)   [employer] of Occupational Health - Occupational Stress Questionnaire    Feeling of Stress : Not at all  Social Connections: Unknown (11/27/2023)   Social Connection and Isolation Panel [NHANES]    Frequency of Communication with Friends and Family: More than three times a week    Frequency of Social Gatherings with Friends and Family: More than three times a week    Attends Religious Services: Never    Database administrator or Organizations: No    Attends Engineer, structural: Never    Marital Status: Patient declined    No Known Allergies  Outpatient Medications Prior to Visit  Medication Sig Dispense Refill   albuterol (VENTOLIN HFA) 108 (90 Base) MCG/ACT inhaler INHALE 1-2 PUFFS INTO THE LUNGS EVERY SIX HOURS AS NEEDED FOR WHEEZING OR SHORTNESS OF BREATH. 18 g 1   allopurinol (ZYLOPRIM) 100 MG tablet Take 1 tablet (100 mg total) by mouth daily. 30 tablet 11   aspirin 81 MG chewable tablet Chew 1 tablet (81 mg total) by mouth daily. 100 tablet 0   blood glucose meter kit and supplies KIT Dispense based on patient and insurance preference. Use up to four times  daily as directed. 1 each 0   calcitRIOL (ROCALTROL) 0.25 MCG capsule Take 1 capsule (0.25 mcg total) by mouth daily. 30 capsule 11   carbamide peroxide (DEBROX) 6.5 % OTIC solution Use 5 drops in each ear the night before your next appt (Patient not taking: Reported on 09/05/2022) 15 mL 0   dapagliflozin propanediol (FARXIGA) 10 MG TABS tablet Take 1 tablet (10 mg total) by mouth in the morning. 30 tablet 11   famotidine (PEPCID) 20 MG tablet TAKE 1 TABLET (20 MG TOTAL) BY MOUTH DAILY. 90 tablet 2   Insulin Pen Needle 31G  X 6 MM MISC Use as directed with breakfast, with lunch, and with evening meal. 120 each 6   Multiple Vitamin (MULTIVITAMIN WITH MINERALS) TABS tablet Take 1 tablet by mouth daily.     ondansetron (ZOFRAN) 4 MG tablet Take 1 tablet (4 mg total) by mouth every 8 (eight) hours as needed for nausea or vomiting. 20 tablet 0   atorvastatin (LIPITOR) 80 MG tablet Take 1 tablet (80 mg total) by mouth daily. 90 tablet 1   carvedilol (COREG) 25 MG tablet Take 1 tablet (25 mg total) by mouth 2 (two) times daily. 180 tablet 1   ferrous sulfate 325 (65 FE) MG tablet Take 1 tablet (325 mg total) by mouth daily with breakfast. 90 tablet 3   hydrALAZINE (APRESOLINE) 100 MG tablet Take 1 tablet (100 mg total) by mouth 3 (three) times daily. 270 tablet 1   Insulin Glargine (BASAGLAR KWIKPEN) 100 UNIT/ML Inject 16 Units into the skin daily. 6 mL 2   insulin lispro (HUMALOG KWIKPEN) 100 UNIT/ML KwikPen Inject 8 Units into the skin 3 (three) times daily. 6 mL 6   isosorbide mononitrate (IMDUR) 120 MG 24 hr tablet Take 1 tablet (120 mg total) by mouth daily. 90 tablet 1   omeprazole (PRILOSEC) 40 MG capsule Take 1 capsule (40 mg total) by mouth daily. 90 capsule 1   potassium chloride (KLOR-CON) 10 MEQ tablet Take 1 tablet (10 mEq total) by mouth 2 (two) times daily. 180 tablet 1   Semaglutide, 2 MG/DOSE, (OZEMPIC, 2 MG/DOSE,) 8 MG/3ML SOPN Inject 2 mg as directed once a week. 3 mL 0   torsemide  (DEMADEX) 20 MG tablet Take 1 tablet (20 mg total) by mouth daily. 90 tablet 1   No facility-administered medications prior to visit.     ROS Review of Systems  Constitutional:  Negative for activity change and appetite change.  HENT:  Negative for sinus pressure and sore throat.   Respiratory:  Negative for chest tightness, shortness of breath and wheezing.   Cardiovascular:  Negative for chest pain and palpitations.  Gastrointestinal:  Negative for abdominal distention, abdominal pain and constipation.  Genitourinary: Negative.   Musculoskeletal: Negative.   Psychiatric/Behavioral:  Negative for behavioral problems and dysphoric mood.     Objective:  BP (!) 145/87 (BP Location: Left Arm, Patient Position: Sitting, Cuff Size: Large)   Pulse (!) 101   Wt (!) 456 lb 9.6 oz (207.1 kg)   SpO2 94%   BMI 65.52 kg/m      11/27/2023    8:59 AM 05/24/2023    9:49 AM 05/24/2023    9:16 AM  BP/Weight  Systolic BP 145 134 149  Diastolic BP 87 84 83  Wt. (Lbs) 456.6  428.2  BMI 65.52 kg/m2  61.44 kg/m2    Wt Readings from Last 3 Encounters:  11/27/23 (!) 456 lb 9.6 oz (207.1 kg)  05/24/23 (!) 428 lb 3.2 oz (194.2 kg)  11/01/22 (!) 476 lb (215.9 kg)     Physical Exam Constitutional:      Appearance: He is well-developed.  Cardiovascular:     Rate and Rhythm: Tachycardia present.     Heart sounds: Normal heart sounds. No murmur heard. Pulmonary:     Effort: Pulmonary effort is normal.     Breath sounds: Normal breath sounds. No wheezing or rales.  Chest:     Chest wall: No tenderness.  Abdominal:     General: Bowel sounds are normal. There is no distension.  Palpations: Abdomen is soft. There is no mass.     Tenderness: There is no abdominal tenderness.  Musculoskeletal:        General: Normal range of motion.     Right lower leg: No edema.     Left lower leg: No edema.  Neurological:     Mental Status: He is alert and oriented to person, place, and time.   Psychiatric:        Mood and Affect: Mood normal.        Latest Ref Rng & Units 03/29/2023   10:39 AM 11/01/2022    3:55 PM 09/02/2022   11:03 AM  CMP  Glucose 70 - 99 mg/dL 956  213  89   BUN 6 - 20 mg/dL 30  16  30    Creatinine 0.61 - 1.24 mg/dL 0.86  5.78  4.69   Sodium 135 - 145 mmol/L 139  147  142   Potassium 3.5 - 5.1 mmol/L 4.2  4.0  4.7   Chloride 98 - 111 mmol/L 112  116  109   CO2 22 - 32 mmol/L 22  25  20    Calcium 8.9 - 10.3 mg/dL 8.5  8.8  8.6   Total Protein 6.5 - 8.1 g/dL  7.1  6.2   Total Bilirubin 0.3 - 1.2 mg/dL  0.6  0.3   Alkaline Phos 38 - 126 U/L  109  125   AST 15 - 41 U/L  14  13   ALT 0 - 44 U/L  14  13     Lipid Panel     Component Value Date/Time   CHOL 174 05/18/2022 1045   TRIG 94 05/18/2022 1045   HDL 49 05/18/2022 1045   CHOLHDL 3.6 05/18/2022 1045   CHOLHDL 3.1 01/27/2021 1105   VLDL 46 (H) 01/27/2021 1105   LDLCALC 108 (H) 05/18/2022 1045    CBC    Component Value Date/Time   WBC 9.4 03/29/2023 1039   RBC 4.41 03/29/2023 1039   HGB 11.0 (L) 03/29/2023 1039   HGB 10.3 (L) 05/18/2022 1045   HCT 36.7 (L) 03/29/2023 1039   HCT 34.0 (L) 05/18/2022 1045   PLT 290 03/29/2023 1039   PLT 370 05/18/2022 1045   MCV 83.2 03/29/2023 1039   MCV 79 05/18/2022 1045   MCH 24.9 (L) 03/29/2023 1039   MCHC 30.0 03/29/2023 1039   RDW 14.1 03/29/2023 1039   RDW 14.5 05/18/2022 1045   LYMPHSABS 1.5 05/18/2022 1045   MONOABS 1.4 (H) 03/09/2021 0415   EOSABS 0.3 05/18/2022 1045   BASOSABS 0.1 05/18/2022 1045    Lab Results  Component Value Date   HGBA1C 6.6 11/27/2023    Assessment & Plan:      Type 2 Diabetes Mellitus A1c increased from 5.8 to 6.6. Consistent use of Ozempic and Lantus (12 units), and Humalog (8 units). Patient has lost >48 pounds on Ozempic initially but then regained 20 pounds over the last 6 months. -Continue Ozempic 2mg  weekly, Lantus 12 units daily, and Humalog 8 units daily. -Ensure patient does not run out of  Ozempic; advise patient to discuss with pharmacy.  Chronic Kidney Disease, Stage 3 Patient is under the care of a nephrologist. Last visit over a month ago, next visit scheduled for January. -Continue current management under nephrologist.  Heart Failure Last echocardiogram in 2022, EF 55% -He is euvolemic  Patient has not seen a cardiologist recently. -Currently on SGLT2i.   Hyperlipidemia Unable to check cholesterol  due to recent food intake. -Reschedule fasting lipid panel for Wednesday. -Continue statin  Gastroesophageal Reflux Disease Patient is on medication for acid reflux. -Continue current medication.  Patient was prescribed Allopurinol by nephrologist but unsure of the reason. -Clarify reason for Allopurinol with nephrologist.  General Health Maintenance -Administer flu shot today. -Refer for annual diabetic eye exam. -Encourage physical activity on warmer days.          Meds ordered this encounter  Medications   atorvastatin (LIPITOR) 80 MG tablet    Sig: Take 1 tablet (80 mg total) by mouth daily.    Dispense:  90 tablet    Refill:  1   carvedilol (COREG) 25 MG tablet    Sig: Take 1 tablet (25 mg total) by mouth 2 (two) times daily.    Dispense:  180 tablet    Refill:  1   ferrous sulfate 325 (65 FE) MG tablet    Sig: Take 1 tablet (325 mg total) by mouth daily with breakfast.    Dispense:  90 tablet    Refill:  3   hydrALAZINE (APRESOLINE) 100 MG tablet    Sig: Take 1 tablet (100 mg total) by mouth 3 (three) times daily.    Dispense:  270 tablet    Refill:  1   Insulin Glargine (BASAGLAR KWIKPEN) 100 UNIT/ML    Sig: Inject 16 Units into the skin daily.    Dispense:  6 mL    Refill:  2    Dose decreased   insulin lispro (HUMALOG KWIKPEN) 100 UNIT/ML KwikPen    Sig: Inject 8 Units into the skin 3 (three) times daily.    Dispense:  6 mL    Refill:  6   isosorbide mononitrate (IMDUR) 120 MG 24 hr tablet    Sig: Take 1 tablet (120 mg total) by  mouth daily.    Dispense:  90 tablet    Refill:  1   omeprazole (PRILOSEC) 40 MG capsule    Sig: Take 1 capsule (40 mg total) by mouth daily.    Dispense:  90 capsule    Refill:  1   potassium chloride (KLOR-CON) 10 MEQ tablet    Sig: Take 1 tablet (10 mEq total) by mouth 2 (two) times daily.    Dispense:  180 tablet    Refill:  1   Semaglutide, 2 MG/DOSE, (OZEMPIC, 2 MG/DOSE,) 8 MG/3ML SOPN    Sig: Inject 2 mg as directed once a week.    Dispense:  3 mL    Refill:  6   torsemide (DEMADEX) 20 MG tablet    Sig: Take 1 tablet (20 mg total) by mouth daily.    Dispense:  90 tablet    Refill:  1    Follow-up: No follow-ups on file.       Hoy Register, MD, FAAFP. [facility] [place], [place] [phone]   11/27/2023, 10:49 AM

## 2023-11-28 ENCOUNTER — Other Ambulatory Visit: Payer: Self-pay

## 2023-11-28 ENCOUNTER — Telehealth: Payer: Self-pay | Admitting: *Deleted

## 2023-11-28 NOTE — Progress Notes (Signed)
Complex Care Management Note   11/28/2023 Name: Erik [name] MRN: [id number] DOB: May 06, 1991  Erik [name] is a 32 y.o. year old male who sees Hoy Register, MD for primary care. I reached out to Erik [name] by phone today to offer complex care management services.  [name] was given information about Complex Care Management services today including:   The Complex Care Management services include support from the care team which includes your Nurse Coordinator, Clinical Social Worker, or Pharmacist.  The Complex Care Management team is here to help remove barriers to the health concerns and goals most important to you. Complex Care Management services are voluntary, and the patient may decline or stop services at any time by request to their care team member.   Complex Care Management Consent Status: Patient did not agree to participate in complex care management services at this time.    Encounter Outcome:  Patient Refused  [facility]  Care Coordination Care Guide  Direct Dial: [phone]

## 2023-11-29 ENCOUNTER — Other Ambulatory Visit: Payer: Self-pay

## 2023-11-29 ENCOUNTER — Ambulatory Visit: Payer: Self-pay | Attending: Family Medicine

## 2023-12-01 ENCOUNTER — Other Ambulatory Visit: Payer: Self-pay

## 2023-12-01 LAB — CMP14+EGFR
ALT: 12 [IU]/L (ref 0–44)
AST: 12 [IU]/L (ref 0–40)
Albumin: 3.5 g/dL — ABNORMAL LOW (ref 4.1–5.1)
Alkaline Phosphatase: 110 [IU]/L (ref 44–121)
BUN/Creatinine Ratio: 15 (ref 9–20)
BUN: 46 mg/dL — ABNORMAL HIGH (ref 6–20)
Bilirubin Total: 0.2 mg/dL (ref 0.0–1.2)
CO2: 16 mmol/L — ABNORMAL LOW (ref 20–29)
Calcium: 8.4 mg/dL — ABNORMAL LOW (ref 8.7–10.2)
Chloride: 115 mmol/L — ABNORMAL HIGH (ref 96–106)
Creatinine, Ser: 3 mg/dL — ABNORMAL HIGH (ref 0.76–1.27)
Globulin, Total: 2.4 g/dL (ref 1.5–4.5)
Glucose: 105 mg/dL — ABNORMAL HIGH (ref 70–99)
Potassium: 4.4 mmol/L (ref 3.5–5.2)
Sodium: 147 mmol/L — ABNORMAL HIGH (ref 134–144)
Total Protein: 5.9 g/dL — ABNORMAL LOW (ref 6.0–8.5)
eGFR: 27 mL/min/{1.73_m2} — ABNORMAL LOW (ref >59)

## 2023-12-01 LAB — LP+NON-HDL CHOLESTEROL
Cholesterol, Total: 104 mg/dL (ref 100–199)
HDL: 47 mg/dL (ref >39)
LDL Chol Calc (NIH): 41 mg/dL (ref 0–99)
Total Non-HDL-Chol (LDL+VLDL): 57 mg/dL (ref 0–129)
Triglycerides: 80 mg/dL (ref 0–149)
VLDL Cholesterol Cal: 16 mg/dL (ref 5–40)

## 2023-12-01 LAB — MICROALBUMIN / CREATININE URINE RATIO
Creatinine, Urine: 80.4 mg/dL
Microalb/Creat Ratio: 4014 mg/g{creat} — ABNORMAL HIGH (ref 0–29)
Microalbumin, Urine: 3227.5 ug/mL

## 2023-12-01 LAB — URIC ACID: Uric Acid: 8.2 mg/dL (ref 3.8–8.4)

## 2023-12-11 ENCOUNTER — Other Ambulatory Visit: Payer: Self-pay

## 2023-12-11 ENCOUNTER — Other Ambulatory Visit: Payer: Self-pay | Admitting: Pharmacist

## 2023-12-11 MED ORDER — SEMAGLUTIDE (1 MG/DOSE) 4 MG/3ML ~~LOC~~ SOPN
1.0000 mg | PEN_INJECTOR | SUBCUTANEOUS | 2 refills | Status: DC
  Filled 2023-12-11: qty 3, 28d supply, fill #0

## 2023-12-12 ENCOUNTER — Other Ambulatory Visit: Payer: Self-pay

## 2023-12-14 ENCOUNTER — Other Ambulatory Visit: Payer: Self-pay

## 2023-12-28 ENCOUNTER — Other Ambulatory Visit: Payer: Self-pay

## 2024-01-04 ENCOUNTER — Other Ambulatory Visit: Payer: Self-pay

## 2024-01-10 ENCOUNTER — Other Ambulatory Visit: Payer: Self-pay

## 2024-01-11 ENCOUNTER — Other Ambulatory Visit: Payer: Self-pay

## 2024-01-11 ENCOUNTER — Telehealth: Payer: Self-pay | Admitting: Physician Assistant

## 2024-01-11 NOTE — Telephone Encounter (Signed)
Med Rec complete, allergies and Pharmacy verified January 11 2024.

## 2024-01-12 ENCOUNTER — Other Ambulatory Visit: Payer: Self-pay

## 2024-01-12 ENCOUNTER — Encounter: Payer: Self-pay | Admitting: Physician Assistant

## 2024-01-12 ENCOUNTER — Ambulatory Visit: Payer: Self-pay | Attending: Physician Assistant | Admitting: Physician Assistant

## 2024-01-12 VITALS — BP 154/90 | HR 96 | Ht 71.0 in | Wt >= 6400 oz

## 2024-01-12 DIAGNOSIS — I5032 Chronic diastolic (congestive) heart failure: Secondary | ICD-10-CM

## 2024-01-12 DIAGNOSIS — G4730 Sleep apnea, unspecified: Secondary | ICD-10-CM

## 2024-01-12 DIAGNOSIS — Z6841 Body Mass Index (BMI) 40.0 and over, adult: Secondary | ICD-10-CM

## 2024-01-12 DIAGNOSIS — N184 Chronic kidney disease, stage 4 (severe): Secondary | ICD-10-CM

## 2024-01-12 DIAGNOSIS — I10 Essential (primary) hypertension: Secondary | ICD-10-CM

## 2024-01-12 DIAGNOSIS — Z8679 Personal history of other diseases of the circulatory system: Secondary | ICD-10-CM

## 2024-01-12 DIAGNOSIS — I2510 Atherosclerotic heart disease of native coronary artery without angina pectoris: Secondary | ICD-10-CM

## 2024-01-12 DIAGNOSIS — I428 Other cardiomyopathies: Secondary | ICD-10-CM

## 2024-01-12 DIAGNOSIS — I472 Ventricular tachycardia, unspecified: Secondary | ICD-10-CM

## 2024-01-12 DIAGNOSIS — E66813 Obesity, class 3: Secondary | ICD-10-CM

## 2024-01-12 MED ORDER — CARVEDILOL 25 MG PO TABS
37.5000 mg | ORAL_TABLET | Freq: Two times a day (BID) | ORAL | 3 refills | Status: DC
  Filled 2024-01-12 (×2): qty 270, 90d supply, fill #0
  Filled 2024-06-13: qty 270, 90d supply, fill #1

## 2024-01-12 NOTE — Patient Instructions (Signed)
Medication Instructions:  Your physician recommends the following medication changes.  INCREASE: Carvedilol 37.5 mg twice daily   *If you need a refill on your cardiac medications before your next appointment, please call your pharmacy*   Lab Work: None ordered at this time    Follow-Up: At [facility], you and your health needs are our priority.  As part of our continuing mission to provide you with exceptional heart care, we have created designated Provider Care Teams.  These Care Teams include your primary Cardiologist (physician) and Advanced Practice Providers (APPs -  Physician Assistants and Nurse Practitioners) who all work together to provide you with the care you need, when you need it.  We recommend signing up for the patient portal called "MyChart".  Sign up information is provided on this After Visit Summary.  MyChart is used to connect with patients for Virtual Visits (Telemedicine).  Patients are able to view lab/test results, encounter notes, upcoming appointments, etc.  Non-urgent messages can be sent to your provider as well.   To learn more about what you can do with MyChart, go to [email or web address].    Your next appointment:   3 month(s)  Provider:   You may see Yvonne Kendall, MD or one of the following Advanced Practice Providers on your designated Care Team:   Nicolasa Ducking, NP Eula Listen, PA-C Cadence Fransico Michael, PA-C Charlsie Quest, NP Carlos Levering, NP

## 2024-01-12 NOTE — Progress Notes (Signed)
Cardiology Office Note    Date:  01/12/2024   ID:  Dencil, Cayson [birth year], MRN [id number]  PCP:  Hoy Register, MD  Cardiologist:  Yvonne Kendall, MD  Electrophysiologist:  None   Chief Complaint: Follow-up  History of Present Illness:   Erik [name] is a 33 y.o. 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  He was admitted to the hospital in 01/2021 with COVID-19 pneumonia, ARDS, severe acidosis and hypoxia requiring intubation and AKI requiring CRRT.  During admission, he developed ST segment elevation and ventricular tachycardia.  LHC showed minimal nonobstructive CAD with an EF of 35 to 45% by ventriculography.  Subsequent echo showed normal LV systolic function at 60 to 65% without regional wall motion abnormalities.  PT was initially treated with amiodarone.  He had a prolonged hospital stay and was subsequently discharged to rehab for nearly a month prior to being rehospitalized in 02/2021 due to hypoxic respiratory failure, pneumonia, and sepsis.  He was also anemic during the admission and required 1 unit of PRBC.  He underwent repeat echo in 09/2021 in the setting of lower extremity swelling that showed an EF of 55% without regional wall motion abnormalities, mild LVH, and no significant valvular abnormalities.  He was last seen in the office in 02/2022 noting intermittent lower extremity swelling.  He was eating a diet high in sodium.  Blood pressure was elevated with recommendation to start Imdur 30 mg with continuation of hydralazine 100 mg 3 times daily and carvedilol 25 mg twice daily.  He comes in doing well from a cardiac perspective and is without symptoms of angina or cardiac decompensation.  No dizziness, presyncope, or syncope.  He reports his blood pressures are largely in the 150s to  160s systolic with an occasional reading in the 170s systolic.  He eats out at [employer] on a daily basis.  He reports adherence to cardiac medications without off target effect.  No falls or symptoms concerning for bleeding.  He denies prior sleep study.   Labs independently reviewed: 12/2023 - Hgb 10.3, PLT 257, magnesium 1.8, BUN 33, serum creatinine 3.37, potassium 5.2, albumin 3.4 11/2023 - TC 104, TG 80, HDL 47, LDL 41, AST/ALT normal, A1c 6.6  Past Medical History:  Diagnosis Date   ARDS survivor    a. 01/2021 in setting of COVID PNA.   Asthma    BMI 60.0-69.9, adult (HCC)    Chronic heart failure with preserved ejection fraction (HFpEF) (HCC)    a. 01/2021 LV gram: EF 35-45%; b. 01/2021 Echo: EF 60-65%; c. 09/2021 Echo: EF 55% (51.8% 2D MOD biplane).   CKD (chronic kidney disease), stage II    a. AKI 01/2021 in setting of COVID.   COVID-19 virus infection 01/2021   Diabetes mellitus without complication (HCC)    Microcytic anemia    Myocarditis (HCC)    a. 01/2021 in setting of COVID PNA.   NICM (nonischemic cardiomyopathy) (HCC)    a. 01/2021 LV gram: EF 35-45%; b. 01/2021 Echo: EF 60-65%, no rwma, nl RV fxn; c. 09/2021 Echo: EF 55% (51.8% 2D MOD biplane). No rwma, nl RV fxn w/ mild enlargement.   Non-obstructive CAD (coronary artery disease)    a. 01/2021 Cath: LM nl, LAD nl, RI nl, LCX nl, LPAV mild dzs, RCA nl, RPDA min  irregs. EF 35-45%.   Super-super obese (HCC)    a. BMI 63.71 - 11/2021.   Ventricular tachycardia (HCC)    a. 01/2021 in setting of myocarditis/COVID.    Past Surgical History:  Procedure Laterality Date   CARDIAC CATHETERIZATION     LEFT HEART CATH AND CORONARY ANGIOGRAPHY N/A 01/20/2021   Procedure: LEFT HEART CATH AND CORONARY ANGIOGRAPHY;  Surgeon: Yvonne Kendall, MD;  Location: [facility] INVASIVE CV LAB;  Service: Cardiovascular;  Laterality: N/A;    Current Medications: No outpatient medications have been marked as taking for the 01/12/24  encounter (Office Visit) with Erik Barges, PA-C.    Allergies:   Patient has no known allergies.   Social History   Socioeconomic History   Marital status: Single    Spouse name: Not on file   Number of children: Not on file   Years of education: Not on file   Highest education level: Not on file  Occupational History   Not on file  Tobacco Use   Smoking status: Never   Smokeless tobacco: Never  Vaping Use   Vaping status: Never Used  Substance and Sexual Activity   Alcohol use: Never   Drug use: Never   Sexual activity: Never  Other Topics Concern   Not on file  Social History Narrative   Not on file   Social Drivers of Health   Financial Resource Strain: Medium Risk (11/27/2023)   Overall Financial Resource Strain (CARDIA)    Difficulty of Paying Living Expenses: Somewhat hard  Food Insecurity: Food Insecurity Present (11/27/2023)   Hunger Vital Sign    Worried About Running Out of Food in the Last Year: Never true    Ran Out of Food in the Last Year: Sometimes true  Transportation Needs: No Transportation Needs (11/27/2023)   PRAPARE - Administrator, Civil Service (Medical): No    Lack of Transportation (Non-Medical): No  Physical Activity: Inactive (11/27/2023)   Exercise Vital Sign    Days of Exercise per Week: 0 days    Minutes of Exercise per Session: 0 min  Stress: No Stress Concern Present (11/27/2023)   [employer] of Occupational Health - Occupational Stress Questionnaire    Feeling of Stress : Not at all  Social Connections: Unknown (11/27/2023)   Social Connection and Isolation Panel [NHANES]    Frequency of Communication with Friends and Family: More than three times a week    Frequency of Social Gatherings with Friends and Family: More than three times a week    Attends Religious Services: Never    Database administrator or Organizations: No    Attends Engineer, structural: Never    Marital Status: Patient declined      Family History:  The patient's family history includes Heart Problems in his maternal grandmother; Heart attack in his maternal grandmother; Heart disease in his maternal grandmother; Hypertension in his mother; Kidney disease in his mother.  ROS:   12-point review of systems is negative unless otherwise noted in the HPI.   EKGs/Labs/Other Studies Reviewed:    Studies reviewed were summarized above. The additional studies were reviewed today:  LHC 01/20/2021: Conclusions: Minimal coronary artery plaquing without angiographically significant coronary artery disease. Moderately reduced left ventricular contraction with global hypokinesis. Mildly elevated left ventricular filling pressure (LVEDP ~20 mmHg).   Recommendations: No obvious coronary artery lesion to explain ST segment changes and sustained ventricular tachycardia.  Possible causes include metabolic/electrolyte derangements and myocarditis in the  setting of COVID-19. Trend HS-TnI until it has peaked, then stop. Obtain transthoracic echocardiogram. If blood pressure tolerates, consider addition of low-dose beta-blocker. If patient has recurrent VT, consider IV amiodarone. Replete electrolytes to maintain K > 4.0 and Mg > 2.0. __________  2D echo 01/20/2021: 1. Left ventricular ejection fraction, by estimation, is 60 to 65%. The  left ventricle has normal function. The left ventricle has no regional  wall motion abnormalities. Left ventricular diastolic parameters were  normal.   2. Right ventricular systolic function is normal. The right ventricular  size is normal.   3. The mitral valve is normal in structure. No evidence of mitral valve  regurgitation. No evidence of mitral stenosis.   4. The aortic valve is normal in structure. Aortic valve regurgitation is  not visualized. No aortic stenosis is present.   5. The inferior vena cava is normal in size with greater than 50%  respiratory variability, suggesting right  atrial pressure of 3 mmHg.  __________  LHC 09/22/2021: 1. Left ventricular ejection fraction, by estimation, is 55%. Left  ventricular ejection fraction by 2D MOD biplane is 51.8 %. The left  ventricle has low normal function. The left ventricle has no regional wall  motion abnormalities. There is mild left  ventricular hypertrophy. Left ventricular diastolic parameters are  indeterminate.   2. Right ventricular systolic function is normal. The right ventricular  size is mildly enlarged.   3. The mitral valve is normal in structure. No evidence of mitral valve  regurgitation.   4. The aortic valve was not well visualized. Aortic valve regurgitation  is not visualized.   5. The inferior vena cava is dilated in size with >50% respiratory  variability, suggesting right atrial pressure of 8 mmHg.    EKG:  EKG is ordered today.  The EKG ordered today demonstrates NSR, 96 bpm, no acute ST-T changes  Recent Labs: 03/29/2023: Hemoglobin 11.0; Platelets 290 11/29/2023: ALT 12; BUN 46; Creatinine, Ser 3.00; Potassium 4.4; Sodium 147  Recent Lipid Panel    Component Value Date/Time   CHOL 104 11/29/2023 0901   TRIG 80 11/29/2023 0901   HDL 47 11/29/2023 0901   CHOLHDL 3.6 05/18/2022 1045   CHOLHDL 3.1 01/27/2021 1105   VLDL 46 (H) 01/27/2021 1105   LDLCALC 41 11/29/2023 0901    PHYSICAL EXAM:    VS:  BP (!) 154/90 (BP Location: Left Arm, Patient Position: Sitting, Cuff Size: Normal)   Pulse 96   Ht 5\' 11"  (1.803 m)   Wt (!) 465 lb 3.2 oz (211 kg)   SpO2 98%   BMI 64.88 kg/m   BMI: Body mass index is 64.88 kg/m.  Physical Exam Vitals reviewed.  Constitutional:      Appearance: He is well-developed.  HENT:     Head: Normocephalic and atraumatic.  Eyes:     General:        Right eye: No discharge.        Left eye: No discharge.  Neck:     Comments: Difficult to assess JVD secondary to body habitus. Cardiovascular:     Rate and Rhythm: Normal rate and regular rhythm.      Heart sounds: Normal heart sounds, S1 normal and S2 normal. Heart sounds not distant. No midsystolic click and no opening snap. No murmur heard.    No friction rub.  Pulmonary:     Effort: Pulmonary effort is normal. No respiratory distress.     Breath sounds: Normal breath sounds. No decreased  breath sounds, wheezing, rhonchi or rales.  Chest:     Chest wall: No tenderness.  Abdominal:     General: There is no distension.  Musculoskeletal:     Cervical back: Normal range of motion.  Skin:    General: Skin is warm and dry.     Nails: There is no clubbing.  Neurological:     Mental Status: He is alert and oriented to person, place, and time.  Psychiatric:        Speech: Speech normal.        Behavior: Behavior normal.        Thought Content: Thought content normal.        Judgment: Judgment normal.     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  Volume status is difficult to assess on physical exam secondary to body habitus.  Continue current pharmacotherapy including carvedilol titrated dose as outlined below, Farxiga, Imdur/hydralazine, and torsemide.  Nonobstructive CAD: No symptoms suggestive of angina.  Continue aspirin, atorvastatin, carvedilol, and Imdur.  No indication for further ischemic testing at this time.  History of myocarditis/VT: Occurred in the setting of acute illness.  No evidence of recurrence.  Remains on beta-blocker as above.  HTN: Blood pressure continues to run on the high side.  Suspect this is multifactorial including morbid obesity, high sodium intake with restaurant food on a daily basis, and possibly in the setting of undiagnosed sleep apnea.  Titrate carvedilol to 37.5 mg twice daily with continuation of  hydralazine 100 mg 3 times daily and Imdur 120 mg daily.  He does miss some doses of hydralazine.  Low-sodium diet is recommended.  CKD stage IV: Followed by nephrology.  Avoid nephrotoxic agents.  Remains on Farxiga.  Morbid obesity: Weight loss is encouraged through out of the diet and regular exercise.  May benefit from bariatric medicine evaluation.  Sleep disordered breathing: Refer to pulmonology for consideration of sleep study.       Disposition: F/u with Dr. Okey Dupre or an APP in 3 months.   Medication Adjustments/Labs and Tests Ordered: Current medicines are reviewed at length with the patient today.  Concerns regarding medicines are outlined above. Medication changes, Labs and Tests ordered today are summarized above and listed in the Patient Instructions accessible in Encounters.   Signed, Eula Listen, PA-C 01/12/2024 9:35 AM     [facility] -  [address] [place], [place] [address] [phone]

## 2024-01-15 ENCOUNTER — Other Ambulatory Visit: Payer: Self-pay

## 2024-01-19 ENCOUNTER — Ambulatory Visit: Payer: Self-pay | Admitting: Sleep Medicine

## 2024-01-19 ENCOUNTER — Encounter: Payer: Self-pay | Admitting: Sleep Medicine

## 2024-01-19 VITALS — BP 156/118 | HR 91 | Temp 97.7°F | Ht 71.0 in | Wt >= 6400 oz

## 2024-01-19 DIAGNOSIS — F5112 Insufficient sleep syndrome: Secondary | ICD-10-CM

## 2024-01-19 DIAGNOSIS — R0683 Snoring: Secondary | ICD-10-CM

## 2024-01-19 DIAGNOSIS — E119 Type 2 diabetes mellitus without complications: Secondary | ICD-10-CM

## 2024-01-19 DIAGNOSIS — I1A0 Resistant hypertension: Secondary | ICD-10-CM

## 2024-01-19 DIAGNOSIS — G4733 Obstructive sleep apnea (adult) (pediatric): Secondary | ICD-10-CM

## 2024-01-19 NOTE — Patient Instructions (Signed)
 Will complete a home sleep study and follow up to review results. Do not drive drowsy for safety of yourself and others.

## 2024-01-19 NOTE — Progress Notes (Signed)
 Name:Erik [name] MRN: [id number] DOB: 12-Dec-1991   CHIEF COMPLAINT:  EXCESSIVE DAYTIME SLEEPINESS   HISTORY OF PRESENT ILLNESS:  Erik [name] is a 33 y.o. w/ a h/o uncontrolled HTN, DMII, hyperlipidemia, morbid obesity and GERD who presents for c/o loud snoring and excessive daytime sleepiness which has been present for several years. Reports nocturnal awakenings due to nocturia, however does not have difficulty falling back to sleep. Reports weight changes due to fluid retention. Denies morning headaches, night sweats, dry mouth or RLS symptoms. Denies a family history of sleep apnea. Denies drowsy driving. Denies caffeine intake. Denies alcohol, tobacco or illicit drug use.   Bedtime 12-2 am Sleep onset 30 mins Rise time 6:30-7 am   EPWORTH SLEEP SCORE 5    01/19/2024    9:07 AM  Results of the Epworth flowsheet  Sitting and reading 2  Watching TV 2  Sitting, inactive in a public place (e.g. a theatre or a meeting) 0  As a passenger in a car for an hour without a break 0  Lying down to rest in the afternoon when circumstances permit 1  Sitting and talking to someone 0  Sitting quietly after a lunch without alcohol 0  In a car, while stopped for a few minutes in traffic 0  Total score 5     PAST MEDICAL HISTORY :   has a past medical history of ARDS survivor, Asthma, BMI 60.0-69.9, adult (HCC), Chronic heart failure with preserved ejection fraction (HFpEF) (HCC), CKD (chronic kidney disease), stage II, COVID-19 virus infection (01/2021), Diabetes mellitus without complication (HCC), Microcytic anemia, Myocarditis (HCC), NICM (nonischemic cardiomyopathy) (HCC), Non-obstructive CAD (coronary artery disease), Super-super obese (HCC), and Ventricular tachycardia (HCC).  has a past surgical history that includes LEFT HEART CATH AND CORONARY ANGIOGRAPHY (N/A, 01/20/2021) and Cardiac catheterization. Prior to Admission medications   Medication Sig Start Date End Date Taking?  Authorizing Provider  albuterol  (VENTOLIN  HFA) 108 (90 Base) MCG/ACT inhaler INHALE 1-2 PUFFS INTO THE LUNGS EVERY SIX HOURS AS NEEDED FOR WHEEZING OR SHORTNESS OF BREATH. 05/18/22 01/19/24 Yes McClung, Jon CHRISTELLA, PA-C  allopurinol  (ZYLOPRIM ) 100 MG tablet Take 1 tablet (100 mg total) by mouth daily. 07/13/23  Yes   aspirin  81 MG chewable tablet Chew 1 tablet (81 mg total) by mouth daily. 04/22/21  Yes Danton Jon CHRISTELLA, PA-C  atorvastatin  (LIPITOR ) 80 MG tablet Take 1 tablet (80 mg total) by mouth daily. 11/27/23  Yes Newlin, Enobong, MD  blood glucose meter kit and supplies KIT Dispense based on patient and insurance preference. Use up to four times daily as directed. 02/26/21  Yes Love, Sharlet RAMAN, PA-C  calcitRIOL  (ROCALTROL ) 0.25 MCG capsule Take 1 capsule (0.25 mcg total) by mouth daily. 10/11/23  Yes   carvedilol  (COREG ) 25 MG tablet Take 1.5 tablets (37.5 mg total) by mouth 2 (two) times daily. 01/12/24  Yes Dunn, Bernardino CHRISTELLA, PA-C  dapagliflozin  propanediol (FARXIGA ) 10 MG TABS tablet Take 1 tablet (10 mg total) by mouth in the morning. 10/24/23  Yes   ferrous sulfate  325 (65 FE) MG tablet Take 1 tablet (325 mg total) by mouth daily with breakfast. 11/27/23  Yes Newlin, Enobong, MD  hydrALAZINE  (APRESOLINE ) 100 MG tablet Take 1 tablet (100 mg total) by mouth 3 (three) times daily. 11/27/23  Yes Newlin, Enobong, MD  Insulin  Glargine (BASAGLAR  KWIKPEN) 100 UNIT/ML Inject 16 Units into the skin daily. 11/27/23  Yes Newlin, Enobong, MD  insulin  lispro (HUMALOG  KWIKPEN) 100 UNIT/ML KwikPen  Inject 8 Units into the skin 3 (three) times daily. 11/27/23  Yes Newlin, Enobong, MD  Insulin  Pen Needle 31G X 6 MM MISC Use as directed with breakfast, with lunch, and with evening meal. 05/24/23  Yes Newlin, Enobong, MD  isosorbide  mononitrate (IMDUR ) 120 MG 24 hr tablet Take 1 tablet (120 mg total) by mouth daily. 11/27/23  Yes Newlin, Enobong, MD  Multiple Vitamin (MULTIVITAMIN WITH MINERALS) TABS tablet Take 1 tablet by  mouth daily. 02/04/21  Yes Caleen Qualia, MD  omeprazole  (PRILOSEC) 40 MG capsule Take 1 capsule (40 mg total) by mouth daily. 11/27/23  Yes Newlin, Enobong, MD  potassium chloride  (KLOR-CON ) 10 MEQ tablet Take 1 tablet (10 mEq total) by mouth 2 (two) times daily. 11/27/23  Yes Newlin, Enobong, MD  Semaglutide , 2 MG/DOSE, (OZEMPIC , 2 MG/DOSE,) 8 MG/3ML SOPN Inject 2 mg as directed once a week. 11/27/23  Yes Newlin, Enobong, MD  torsemide  (DEMADEX ) 20 MG tablet Take 1 tablet (20 mg total) by mouth daily. 11/27/23  Yes Newlin, Corrina, MD  famotidine  (PEPCID ) 20 MG tablet TAKE 1 TABLET (20 MG TOTAL) BY MOUTH DAILY. 05/18/22 05/18/23  Danton Jon HERO, PA-C   No Known Allergies  FAMILY HISTORY:  family history includes Heart Problems in his maternal grandmother; Heart attack in his maternal grandmother; Heart disease in his maternal grandmother; Hypertension in his mother; Kidney disease in his mother. SOCIAL HISTORY:  reports that he has never smoked. He has never used smokeless tobacco. He reports that he does not drink alcohol and does not use drugs.   Review of Systems:  Gen:  Denies  fever, sweats, chills weight loss  HEENT: Denies blurred vision, double vision, ear pain, eye pain, hearing loss, nose bleeds, sore throat Cardiac:  No dizziness, chest pain or heaviness, chest tightness,edema, No JVD Resp:   No cough, -sputum production, -shortness of breath,-wheezing, -hemoptysis,  Gi: Denies swallowing difficulty, stomach pain, nausea or vomiting, diarrhea, constipation, bowel incontinence Gu:  Denies bladder incontinence, burning urine Ext:   Denies Joint pain, stiffness or swelling Skin: Denies  skin rash, easy bruising or bleeding or hives Endoc:  Denies polyuria, polydipsia , polyphagia or weight change Psych:   Denies depression, insomnia or hallucinations  Other:  All other systems negative  VITAL SIGNS: BP (!) 156/120 (BP Location: Left Arm, Patient Position: Sitting, Cuff Size:  Normal)   Pulse 91   Temp 97.7 F (36.5 C) (Temporal)   Ht 5' 11 (1.803 m)   Wt (!) 456 lb (206.8 kg)   SpO2 99%   BMI 63.60 kg/m    Physical Examination:   General Appearance: No distress  EYES PERRLA, EOM intact.   NECK Supple, No JVD Throat Mallampati IV Pulmonary: normal breath sounds, No wheezing.  CardiovascularNormal S1,S2.  No m/r/g.   Abdomen: Benign, Soft, non-tender. Skin:   warm, no rashes, no ecchymosis  Extremities: normal, no cyanosis, clubbing. Neuro:without focal findings,  speech normal  PSYCHIATRIC: Mood, affect within normal limits.   ASSESSMENT AND PLAN  OSA I suspect that OSA is likely present due to clinical presentation. Discussed the consequences of untreated sleep apnea. Advised not to drive drowsy for safety of patient and others. Will complete further evaluation with a home sleep study and follow up to review results.    Insufficient sleep syndrome Counseled patient on increasing total sleep time to 7-8 hours per night.   Resistant HTN Significantly elevated, states that he has not taken medication this morning. Counseled patient on taking medication  as directed by prescribing provider. Also advised patient to follow up with PCP or cardiologist for further management.    DMII Stable, on current management. Following PCP.   Morbid obesity- Counseled patient on diet and lifestyle modification.    MEDICATION ADJUSTMENTS/LABS AND TESTS ORDERED: Recommend Sleep Study   Patient  satisfied with Plan of action and management. All questions answered  Follow up to review HST results and treatment plan.   I spent a total of 32 minutes reviewing chart data, face-to-face evaluation with the patient, counseling and coordination of care as detailed above.    Mikyla Schachter, M.D.  Sleep Medicine [facility]

## 2024-01-23 ENCOUNTER — Telehealth: Payer: Self-pay

## 2024-01-23 ENCOUNTER — Other Ambulatory Visit: Payer: Self-pay

## 2024-01-23 NOTE — Telephone Encounter (Signed)
Received notification from [employer] regarding approval for [facility]. Patient assistance approved from 01/23/2024 to 01/22/2025.  Medication will ship to [address] [place], [address]  Pt ID: [id number]  Company phone: [phone]

## 2024-01-30 ENCOUNTER — Other Ambulatory Visit: Payer: Self-pay

## 2024-02-15 ENCOUNTER — Other Ambulatory Visit: Payer: Self-pay

## 2024-02-16 ENCOUNTER — Other Ambulatory Visit: Payer: Self-pay

## 2024-02-26 ENCOUNTER — Other Ambulatory Visit: Payer: Self-pay

## 2024-02-26 MED ORDER — CLONIDINE HCL 0.1 MG PO TABS
ORAL_TABLET | ORAL | 11 refills | Status: DC
  Filled 2024-02-26: qty 60, 30d supply, fill #0

## 2024-02-27 ENCOUNTER — Other Ambulatory Visit: Payer: Self-pay

## 2024-03-21 ENCOUNTER — Other Ambulatory Visit: Payer: Self-pay

## 2024-03-22 ENCOUNTER — Other Ambulatory Visit: Payer: Self-pay

## 2024-04-15 ENCOUNTER — Other Ambulatory Visit: Payer: Self-pay

## 2024-04-15 ENCOUNTER — Ambulatory Visit: Payer: Self-pay | Admitting: Physician Assistant

## 2024-04-16 NOTE — Progress Notes (Unsigned)
 Cardiology Office Note    Date:  04/16/2024   ID:  Erik, [name] 1991/09/20, MRN [id number]  PCP:  Joaquin Mulberry, MD  Cardiologist:  Sammy Crisp, MD  Electrophysiologist:  None   Chief Complaint: ***  History of Present Illness:   Erik [name] is a 33 y.o. male with history of ***  ***   Labs independently reviewed: 02/2024 - Hgb 10.7, PLT 289, BUN 48, serum creatinine 3.96, potassium 4.3, albumin 3.8 12/2023 - magnesium  1.8 11/2023 - TC 104, TG 80, HDL 47, LDL 41, AST/ALT normal, A1c 6.6  Past Medical History:  Diagnosis Date   ARDS survivor    a. 01/2021 in setting of COVID PNA.   Asthma    BMI 60.0-69.9, adult (HCC)    Chronic heart failure with preserved ejection fraction (HFpEF) (HCC)    a. 01/2021 LV gram: EF 35-45%; b. 01/2021 Echo: EF 60-65%; c. 09/2021 Echo: EF 55% (51.8% 2D MOD biplane).   CKD (chronic kidney disease), stage II    a. AKI 01/2021 in setting of COVID.   COVID-19 virus infection 01/2021   Diabetes mellitus without complication (HCC)    Microcytic anemia    Myocarditis (HCC)    a. 01/2021 in setting of COVID PNA.   NICM (nonischemic cardiomyopathy) (HCC)    a. 01/2021 LV gram: EF 35-45%; b. 01/2021 Echo: EF 60-65%, no rwma, nl RV fxn; c. 09/2021 Echo: EF 55% (51.8% 2D MOD biplane). No rwma, nl RV fxn w/ mild enlargement.   Non-obstructive CAD (coronary artery disease)    a. 01/2021 Cath: LM nl, LAD nl, RI nl, LCX nl, LPAV mild dzs, RCA nl, RPDA min irregs. EF 35-45%.   Super-super obese (HCC)    a. BMI 63.71 - 11/2021.   Ventricular tachycardia (HCC)    a. 01/2021 in setting of myocarditis/COVID.    Past Surgical History:  Procedure Laterality Date   CARDIAC CATHETERIZATION     LEFT HEART CATH AND CORONARY ANGIOGRAPHY N/A 01/20/2021   Procedure: LEFT HEART CATH AND CORONARY ANGIOGRAPHY;  Surgeon: Sammy Crisp, MD;  Location: [facility] INVASIVE CV LAB;  Service: Cardiovascular;  Laterality: N/A;    Current Medications: No outpatient  medications have been marked as taking for the 04/17/24 encounter (Appointment) with Roark Chick, PA-C.    Allergies:   Patient has no known allergies.   Social History   Socioeconomic History   Marital status: Single    Spouse name: Not on file   Number of children: Not on file   Years of education: Not on file   Highest education level: Not on file  Occupational History   Not on file  Tobacco Use   Smoking status: Never   Smokeless tobacco: Never  Vaping Use   Vaping status: Never Used  Substance and Sexual Activity   Alcohol use: Never   Drug use: Never   Sexual activity: Never  Other Topics Concern   Not on file  Social History Narrative   Not on file   Social Drivers of Health   Financial Resource Strain: Medium Risk (11/27/2023)   Overall Financial Resource Strain (CARDIA)    Difficulty of Paying Living Expenses: Somewhat hard  Food Insecurity: Food Insecurity Present (11/27/2023)   Hunger Vital Sign    Worried About Running Out of Food in the Last Year: Not on file    Ran Out of Food in the Last Year: Sometimes true  Transportation Needs: No Transportation Needs (11/27/2023)   PRAPARE -  Administrator, Civil Service (Medical): No    Lack of Transportation (Non-Medical): No  Physical Activity: Inactive (11/27/2023)   Exercise Vital Sign    Days of Exercise per Week: 0 days    Minutes of Exercise per Session: 0 min  Stress: No Stress Concern Present (11/27/2023)   [employer] of Occupational Health - Occupational Stress Questionnaire    Feeling of Stress : Not at all  Social Connections: Unknown (11/27/2023)   Social Connection and Isolation Panel [NHANES]    Frequency of Communication with Friends and Family: More than three times a week    Frequency of Social Gatherings with Friends and Family: More than three times a week    Attends Religious Services: Never    Database administrator or Organizations: No    Attends Hospital doctor: Never    Marital Status: Patient declined     Family History:  The patient's family history includes Heart Problems in his maternal grandmother; Heart attack in his maternal grandmother; Heart disease in his maternal grandmother; Hypertension in his mother; Kidney disease in his mother.  ROS:   12-point review of systems is negative unless otherwise noted in the HPI.   EKGs/Labs/Other Studies Reviewed:    Studies reviewed were summarized above. The additional studies were reviewed today:  LHC 01/20/2021: Conclusions: Minimal coronary artery plaquing without angiographically significant coronary artery disease. Moderately reduced left ventricular contraction with global hypokinesis. Mildly elevated left ventricular filling pressure (LVEDP ~20 mmHg).   Recommendations: No obvious coronary artery lesion to explain ST segment changes and sustained ventricular tachycardia.  Possible causes include metabolic/electrolyte derangements and myocarditis in the setting of COVID-19. Trend HS-TnI until it has peaked, then stop. Obtain transthoracic echocardiogram. If blood pressure tolerates, consider addition of low-dose beta-blocker. If patient has recurrent VT, consider IV amiodarone. Replete electrolytes to maintain K > 4.0 and Mg > 2.0. __________   2D echo 01/20/2021: 1. Left ventricular ejection fraction, by estimation, is 60 to 65%. The  left ventricle has normal function. The left ventricle has no regional  wall motion abnormalities. Left ventricular diastolic parameters were  normal.   2. Right ventricular systolic function is normal. The right ventricular  size is normal.   3. The mitral valve is normal in structure. No evidence of mitral valve  regurgitation. No evidence of mitral stenosis.   4. The aortic valve is normal in structure. Aortic valve regurgitation is  not visualized. No aortic stenosis is present.   5. The inferior vena cava is normal in size with greater  than 50%  respiratory variability, suggesting right atrial pressure of 3 mmHg.  __________   2D echo 09/22/2021: 1. Left ventricular ejection fraction, by estimation, is 55%. Left  ventricular ejection fraction by 2D MOD biplane is 51.8 %. The left  ventricle has low normal function. The left ventricle has no regional wall  motion abnormalities. There is mild left  ventricular hypertrophy. Left ventricular diastolic parameters are  indeterminate.   2. Right ventricular systolic function is normal. The right ventricular  size is mildly enlarged.   3. The mitral valve is normal in structure. No evidence of mitral valve  regurgitation.   4. The aortic valve was not well visualized. Aortic valve regurgitation  is not visualized.   5. The inferior vena cava is dilated in size with >50% respiratory  variability, suggesting right atrial pressure of 8 mmHg.    EKG:  EKG is ordered today.  The EKG ordered today demonstrates ***  Recent Labs: 11/29/2023: ALT 12; BUN 46; Creatinine, Ser 3.00; Potassium 4.4; Sodium 147  Recent Lipid Panel    Component Value Date/Time   CHOL 104 11/29/2023 0901   TRIG 80 11/29/2023 0901   HDL 47 11/29/2023 0901   CHOLHDL 3.6 05/18/2022 1045   CHOLHDL 3.1 01/27/2021 1105   VLDL 46 (H) 01/27/2021 1105   LDLCALC 41 11/29/2023 0901    PHYSICAL EXAM:    VS:  There were no vitals taken for this visit.  BMI: There is no height or weight on file to calculate BMI.  Physical Exam  Wt Readings from Last 3 Encounters:  01/19/24 (!) 456 lb (206.8 kg)  01/12/24 (!) 465 lb 3.2 oz (211 kg)  11/27/23 (!) 456 lb 9.6 oz (207.1 kg)     ASSESSMENT & PLAN:   HFimpEF:  Nonobstructive CAD:  History of myocarditis/VT:  HTN: Blood pressure  CKD stage IV:  Morbid obesity:  Sleep disordered breathing:   {Are you ordering a CV Procedure (e.g. stress test, cath, DCCV, TEE, etc)?   Press F2        :[id number]}     Disposition: F/u with Dr. Nolan Battle or an APP in  ***.   Medication Adjustments/Labs and Tests Ordered: Current medicines are reviewed at length with the patient today.  Concerns regarding medicines are outlined above. Medication changes, Labs and Tests ordered today are summarized above and listed in the Patient Instructions accessible in Encounters.   Signed, Varney Gentleman, PA-C 04/16/2024 8:44 AM     [facility] [place] [address] [place], [place] [address] [phone]

## 2024-04-17 ENCOUNTER — Ambulatory Visit: Payer: Self-pay | Attending: Physician Assistant | Admitting: Nurse Practitioner

## 2024-04-17 ENCOUNTER — Encounter: Payer: Self-pay | Admitting: Nurse Practitioner

## 2024-04-17 ENCOUNTER — Other Ambulatory Visit: Payer: Self-pay

## 2024-04-17 VITALS — BP 158/110 | HR 96 | Resp 18 | Ht 70.0 in | Wt >= 6400 oz

## 2024-04-17 DIAGNOSIS — E66813 Obesity, class 3: Secondary | ICD-10-CM

## 2024-04-17 DIAGNOSIS — I2510 Atherosclerotic heart disease of native coronary artery without angina pectoris: Secondary | ICD-10-CM

## 2024-04-17 DIAGNOSIS — E118 Type 2 diabetes mellitus with unspecified complications: Secondary | ICD-10-CM

## 2024-04-17 DIAGNOSIS — I472 Ventricular tachycardia, unspecified: Secondary | ICD-10-CM

## 2024-04-17 DIAGNOSIS — Z6841 Body Mass Index (BMI) 40.0 and over, adult: Secondary | ICD-10-CM

## 2024-04-17 DIAGNOSIS — I10 Essential (primary) hypertension: Secondary | ICD-10-CM

## 2024-04-17 DIAGNOSIS — I5032 Chronic diastolic (congestive) heart failure: Secondary | ICD-10-CM

## 2024-04-17 DIAGNOSIS — Z8679 Personal history of other diseases of the circulatory system: Secondary | ICD-10-CM

## 2024-04-17 DIAGNOSIS — I428 Other cardiomyopathies: Secondary | ICD-10-CM

## 2024-04-17 DIAGNOSIS — N184 Chronic kidney disease, stage 4 (severe): Secondary | ICD-10-CM

## 2024-04-17 DIAGNOSIS — G4730 Sleep apnea, unspecified: Secondary | ICD-10-CM

## 2024-04-17 DIAGNOSIS — Z794 Long term (current) use of insulin: Secondary | ICD-10-CM

## 2024-04-17 MED ORDER — CLONIDINE HCL 0.2 MG PO TABS
0.2000 mg | ORAL_TABLET | Freq: Two times a day (BID) | ORAL | 2 refills | Status: DC
  Filled 2024-04-17: qty 180, 90d supply, fill #0

## 2024-04-17 NOTE — Patient Instructions (Signed)
 Medication Instructions:  Increase Clonidine  0.2 mg twice daily   *If you need a refill on your cardiac medications before your next appointment, please call your pharmacy*  Follow-Up: At [facility], you and your health needs are our priority.  As part of our continuing mission to provide you with exceptional heart care, our providers are all part of one team.  This team includes your primary Cardiologist (physician) and Advanced Practice Providers or APPs (Physician Assistants and Nurse Practitioners) who all work together to provide you with the care you need, when you need it.  Your next appointment:   6 month(s)  Provider:   You may see Sammy Crisp, MD or one of the following Advanced Practice Providers on your designated Care Team:   Laneta Pintos, NP Gildardo Labrador, PA-C Varney Gentleman, PA-C Cadence Borger, PA-C Ronald Cockayne, NP Morey Ar, NP    We recommend signing up for the patient portal called "MyChart".  Sign up information is provided on this After Visit Summary.  MyChart is used to connect with patients for Virtual Visits (Telemedicine).  Patients are able to view lab/test results, encounter notes, upcoming appointments, etc.  Non-urgent messages can be sent to your provider as well.   To learn more about what you can do with MyChart, go to [email or web address].

## 2024-04-18 ENCOUNTER — Other Ambulatory Visit: Payer: Self-pay

## 2024-04-24 ENCOUNTER — Other Ambulatory Visit: Payer: Self-pay

## 2024-05-16 ENCOUNTER — Other Ambulatory Visit (INDEPENDENT_AMBULATORY_CARE_PROVIDER_SITE_OTHER): Payer: Self-pay | Admitting: Vascular Surgery

## 2024-05-16 DIAGNOSIS — N186 End stage renal disease: Secondary | ICD-10-CM

## 2024-05-20 ENCOUNTER — Ambulatory Visit (INDEPENDENT_AMBULATORY_CARE_PROVIDER_SITE_OTHER): Payer: Self-pay

## 2024-05-20 ENCOUNTER — Encounter (INDEPENDENT_AMBULATORY_CARE_PROVIDER_SITE_OTHER): Payer: Self-pay | Admitting: Vascular Surgery

## 2024-05-20 ENCOUNTER — Ambulatory Visit (INDEPENDENT_AMBULATORY_CARE_PROVIDER_SITE_OTHER): Payer: Self-pay | Admitting: Vascular Surgery

## 2024-05-20 ENCOUNTER — Other Ambulatory Visit (INDEPENDENT_AMBULATORY_CARE_PROVIDER_SITE_OTHER): Payer: Self-pay

## 2024-05-20 VITALS — BP 161/94 | HR 98 | Resp 18 | Ht 70.0 in | Wt >= 6400 oz

## 2024-05-20 DIAGNOSIS — E1022 Type 1 diabetes mellitus with diabetic chronic kidney disease: Secondary | ICD-10-CM

## 2024-05-20 DIAGNOSIS — I10 Essential (primary) hypertension: Secondary | ICD-10-CM

## 2024-05-20 DIAGNOSIS — N186 End stage renal disease: Secondary | ICD-10-CM

## 2024-05-20 DIAGNOSIS — I2111 ST elevation (STEMI) myocardial infarction involving right coronary artery: Secondary | ICD-10-CM

## 2024-05-20 DIAGNOSIS — N184 Chronic kidney disease, stage 4 (severe): Secondary | ICD-10-CM

## 2024-05-20 DIAGNOSIS — E785 Hyperlipidemia, unspecified: Secondary | ICD-10-CM

## 2024-05-20 NOTE — Progress Notes (Unsigned)
 MRN : [id number]  Erik [name] is a 33 y.o. (06-08-1991) male who presents with chief complaint of check access.  History of Present Illness:   The patient is seen for evaluation for dialysis access. The patient has chronic renal insufficiency stage V secondary to hypertension. The patient's most recent creatinine clearance is less than 20. The patient volume status has not yet become an issue. Patient's blood pressures been relatively well controlled. There are mild uremic symptoms which appear to be relatively well tolerated at this time.  The patient notes the kidney problem has been present for a long time and has been progressively getting worse.  The patient is followed by nephrology.    The patient is right-handed.  The patient has been considering the various methods of dialysis and wishes to proceed with hemodialysis and therefore creation of AV access is indicated.  The patient denies amaurosis fugax or recent TIA symptoms. There are no recent neurological changes noted. There is no history of DVT, PE or superficial thrombophlebitis. No recent episodes of angina or shortness of breath documented.   Current Meds  Medication Sig   allopurinol  (ZYLOPRIM ) 100 MG tablet Take 1 tablet (100 mg total) by mouth daily.   aspirin  81 MG chewable tablet Chew 1 tablet (81 mg total) by mouth daily.   atorvastatin  (LIPITOR ) 80 MG tablet Take 1 tablet (80 mg total) by mouth daily.   blood glucose meter kit and supplies KIT Dispense based on patient and insurance preference. Use up to four times daily as directed.   calcitRIOL  (ROCALTROL ) 0.25 MCG capsule Take 1 capsule (0.25 mcg total) by mouth daily.   carvedilol  (COREG ) 25 MG tablet Take 1.5 tablets (37.5 mg total) by mouth 2 (two) times daily.   cloNIDine  (CATAPRES ) 0.2 MG tablet Take 1 tablet (0.2 mg total) by mouth 2 (two) times daily.   dapagliflozin  propanediol (FARXIGA ) 10 MG TABS tablet Take 1 tablet (10 mg  total) by mouth in the morning.   ferrous sulfate  325 (65 FE) MG tablet Take 1 tablet (325 mg total) by mouth daily with breakfast.   hydrALAZINE  (APRESOLINE ) 100 MG tablet Take 1 tablet (100 mg total) by mouth 3 (three) times daily.   Insulin  Glargine (BASAGLAR  KWIKPEN) 100 UNIT/ML Inject 16 Units into the skin daily.   insulin  lispro (HUMALOG  KWIKPEN) 100 UNIT/ML KwikPen Inject 8 Units into the skin 3 (three) times daily.   Insulin  Pen Needle 31G X 6 MM MISC Use as directed with breakfast, with lunch, and with evening meal.   isosorbide  mononitrate (IMDUR ) 120 MG 24 hr tablet Take 1 tablet (120 mg total) by mouth daily.   Multiple Vitamin (MULTIVITAMIN WITH MINERALS) TABS tablet Take 1 tablet by mouth daily.   omeprazole  (PRILOSEC) 40 MG capsule Take 1 capsule (40 mg total) by mouth daily.   potassium chloride  (KLOR-CON ) 10 MEQ tablet Take 1 tablet (10 mEq total) by mouth 2 (two) times daily.   Semaglutide , 2 MG/DOSE, (OZEMPIC , 2 MG/DOSE,) 8 MG/3ML SOPN Inject 2 mg as directed once a week.   torsemide  (DEMADEX ) 20 MG tablet Take 1 tablet (20 mg total) by mouth daily.    Past Medical History:  Diagnosis Date   ARDS survivor    a. 01/2021 in setting of COVID PNA.   Asthma    BMI 60.0-69.9, adult (HCC)    CKD (chronic kidney disease), stage  IV (HCC)    a. AKI 01/2021 in setting of COVID.   COVID-19 virus infection 01/2021   Diabetes mellitus without complication (HCC)    Heart failure with improved ejection fraction (HFimpEF) (HCC)    a. 01/2021 LV gram: EF 35-45%; b. 01/2021 Echo: EF 60-65%; c. 09/2021 Echo: EF 55% (51.8% 2D MOD biplane).   Microcytic anemia    Myocarditis (HCC)    a. 01/2021 in setting of COVID PNA.   NICM (nonischemic cardiomyopathy) (HCC)    a. 01/2021 LV gram: EF 35-45%; b. 01/2021 Echo: EF 60-65%, no rwma, nl RV fxn; c. 09/2021 Echo: EF 55% (51.8% 2D MOD biplane). No rwma, nl RV fxn w/ mild enlargement.   Non-obstructive CAD (coronary artery disease)    a. 01/2021 Cath:  LM nl, LAD nl, RI nl, LCX nl, LPAV mild dzs, RCA nl, RPDA min irregs. EF 35-45%.   Super-super obese (HCC)    a. BMI 63.71 - 11/2021.   Ventricular tachycardia (HCC)    a. 01/2021 in setting of myocarditis/COVID.    Past Surgical History:  Procedure Laterality Date   CARDIAC CATHETERIZATION     LEFT HEART CATH AND CORONARY ANGIOGRAPHY N/A 01/20/2021   Procedure: LEFT HEART CATH AND CORONARY ANGIOGRAPHY;  Surgeon: Sammy Crisp, MD;  Location: [facility] INVASIVE CV LAB;  Service: Cardiovascular;  Laterality: N/A;    Social History Social History   Tobacco Use   Smoking status: Never   Smokeless tobacco: Never  Vaping Use   Vaping status: Never Used  Substance Use Topics   Alcohol use: Never   Drug use: Never    Family History Family History  Problem Relation Age of Onset   Hypertension Mother    Kidney disease Mother    Heart Problems Maternal Grandmother    Heart attack Maternal Grandmother    Heart disease Maternal Grandmother     No Known Allergies   REVIEW OF SYSTEMS (Negative unless checked)  Constitutional: [] Weight loss  [] Fever  [] Chills Cardiac: [] Chest pain   [] Chest pressure   [] Palpitations   [] Shortness of breath when laying flat   [] Shortness of breath with exertion. Vascular:  [] Pain in legs with walking   [] Pain in legs at rest  [] History of DVT   [] Phlebitis   [] Swelling in legs   [] Varicose veins   [] Non-healing ulcers Pulmonary:   [] Uses home oxygen   [] Productive cough   [] Hemoptysis   [] Wheeze  [] COPD   [] Asthma Neurologic:  [] Dizziness   [] Seizures   [] History of stroke   [] History of TIA  [] Aphasia   [] Vissual changes   [] Weakness or numbness in arm   [] Weakness or numbness in leg Musculoskeletal:   [] Joint swelling   [] Joint pain   [] Low back pain Hematologic:  [] Easy bruising  [] Easy bleeding   [] Hypercoagulable state   [] Anemic Gastrointestinal:  [] Diarrhea   [] Vomiting  [] Gastroesophageal reflux/heartburn   [] Difficulty swallowing. Genitourinary:   [x] Chronic kidney disease   [] Difficult urination  [] Frequent urination   [] Blood in urine Skin:  [] Rashes   [] Ulcers  Psychological:  [] History of anxiety   []  History of major depression.  Physical Examination  Vitals:   05/20/24 1349  BP: (!) 161/94  Pulse: 98  Resp: 18  Weight: (!) 485 lb (220 kg)  Height: 5\' 10"  (1.778 m)   Body mass index is 69.59 kg/m. Gen: WD/WN, NAD Head: [place]/AT, No temporalis wasting.  Ear/Nose/Throat: Hearing grossly intact, nares w/o erythema or drainage Eyes: PER, EOMI, sclera nonicteric.  Neck: Supple, no gross masses or lesions.  No JVD.  Pulmonary:  Good air movement, no audible wheezing, no use of accessory muscles.  Cardiac: RRR, precordium non-hyperdynamic. Vascular:   The cephalic vein is not well-visualized on either upper extremity.  It is palpable at the wrist. Vessel Right Left  Radial Palpable Palpable  Brachial Palpable Palpable  Gastrointestinal: soft, non-distended. No guarding/no peritoneal signs.  Musculoskeletal: M/S 5/5 throughout.  No deformity.  Neurologic: CN 2-12 intact. Pain and light touch intact in extremities.  Symmetrical.  Speech is fluent. Motor exam as listed above. Psychiatric: Judgment intact, Mood & affect appropriate for pt's clinical situation. Dermatologic: No rashes or ulcers noted.  No changes consistent with cellulitis.   CBC Lab Results  Component Value Date   WBC 9.4 03/29/2023   HGB 11.0 (L) 03/29/2023   HCT 36.7 (L) 03/29/2023   MCV 83.2 03/29/2023   PLT 290 03/29/2023    BMET    Component Value Date/Time   NA 147 (H) 11/29/2023 0901   K 4.4 11/29/2023 0901   CL 115 (H) 11/29/2023 0901   CO2 16 (L) 11/29/2023 0901   GLUCOSE 105 (H) 11/29/2023 0901   GLUCOSE 101 (H) 03/29/2023 1039   BUN 46 (H) 11/29/2023 0901   CREATININE 3.00 (H) 11/29/2023 0901   CALCIUM  8.4 (L) 11/29/2023 0901   GFRNONAA 31 (L) 03/29/2023 1039   CrCl cannot be calculated (Patient's most recent lab result is older  than the maximum 21 days allowed.).  COAG Lab Results  Component Value Date   INR 1.2 03/08/2021   INR 1.2 01/12/2021   INR SPECIMEN CLOTTED 01/12/2021    Radiology No results found.   Assessment/Plan 1. Chronic kidney disease (CKD), stage IV (severe) (HCC) (Primary) Recommend:  At this time the patient does not have appropriate extremity access for dialysis  Patient should have a right radial cephalic fistula created.  The risks, benefits and alternative therapies were reviewed in detail with the patient.  All questions were answered.  The patient agrees to proceed with surgery.   The patient will follow up with me in the office after the surgery.  2. ST elevation myocardial infarction involving right coronary artery (HCC) Continue cardiac and antihypertensive medications as already ordered and reviewed, no changes at this time.  Continue statin as ordered and reviewed, no changes at this time  Nitrates PRN for chest pain  3. Essential hypertension Continue antihypertensive medications as already ordered, these medications have been reviewed and there are no changes at this time.  4. 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  Hgb A1C to be monitored as already arranged by primary service  5. Dyslipidemia Continue statin as ordered and reviewed, no changes at this time    Devon Fogo, MD  05/20/2024 1:57 PM

## 2024-05-21 ENCOUNTER — Encounter (INDEPENDENT_AMBULATORY_CARE_PROVIDER_SITE_OTHER): Payer: Self-pay | Admitting: Vascular Surgery

## 2024-05-21 DIAGNOSIS — N184 Chronic kidney disease, stage 4 (severe): Secondary | ICD-10-CM | POA: Insufficient documentation

## 2024-06-10 ENCOUNTER — Other Ambulatory Visit: Payer: Self-pay

## 2024-06-10 MED ORDER — SODIUM BICARBONATE 650 MG PO TABS
ORAL_TABLET | ORAL | 11 refills | Status: AC
  Filled 2024-06-10: qty 120, 30d supply, fill #0
  Filled 2024-09-23: qty 120, 30d supply, fill #1

## 2024-06-10 MED ORDER — CALCITRIOL 0.25 MCG PO CAPS
0.2500 ug | ORAL_CAPSULE | Freq: Every day | ORAL | 3 refills | Status: AC
  Filled 2024-06-10: qty 30, 30d supply, fill #0

## 2024-06-10 MED ORDER — CLONIDINE HCL 0.1 MG PO TABS
ORAL_TABLET | ORAL | 11 refills | Status: AC
  Filled 2024-06-10: qty 120, 30d supply, fill #0

## 2024-06-13 ENCOUNTER — Other Ambulatory Visit: Payer: Self-pay

## 2024-06-19 ENCOUNTER — Other Ambulatory Visit: Payer: Self-pay

## 2024-07-15 ENCOUNTER — Telehealth (INDEPENDENT_AMBULATORY_CARE_PROVIDER_SITE_OTHER): Payer: Self-pay

## 2024-07-15 NOTE — Telephone Encounter (Signed)
 Spoke with the patient and he is scheduled with Dr. Jama for a right radialcephalic AV fistula on 07/26/24 at the [facility]. Pre-admit will call to schedule pre-op  at the [facility]. Pre-surgical instructions were discussed and will be mailed.

## 2024-07-22 ENCOUNTER — Telehealth: Payer: Self-pay

## 2024-07-22 ENCOUNTER — Encounter
Admission: RE | Admit: 2024-07-22 | Discharge: 2024-07-22 | Disposition: A | Discharge status: Home / Self Care | Payer: Self-pay | Source: Ambulatory Visit | Attending: Vascular Surgery | Admitting: Vascular Surgery

## 2024-07-22 ENCOUNTER — Other Ambulatory Visit (INDEPENDENT_AMBULATORY_CARE_PROVIDER_SITE_OTHER): Payer: Self-pay | Admitting: Nurse Practitioner

## 2024-07-22 ENCOUNTER — Other Ambulatory Visit: Payer: Self-pay

## 2024-07-22 VITALS — BP 189/108 | HR 90 | Resp 18 | Ht 70.0 in | Wt >= 6400 oz

## 2024-07-22 DIAGNOSIS — N186 End stage renal disease: Secondary | ICD-10-CM | POA: Insufficient documentation

## 2024-07-22 DIAGNOSIS — Z01818 Encounter for other preprocedural examination: Secondary | ICD-10-CM | POA: Insufficient documentation

## 2024-07-22 DIAGNOSIS — N184 Chronic kidney disease, stage 4 (severe): Secondary | ICD-10-CM

## 2024-07-22 DIAGNOSIS — Z01810 Encounter for preprocedural cardiovascular examination: Secondary | ICD-10-CM

## 2024-07-22 DIAGNOSIS — E1022 Type 1 diabetes mellitus with diabetic chronic kidney disease: Secondary | ICD-10-CM | POA: Insufficient documentation

## 2024-07-22 HISTORY — DX: ST elevation (STEMI) myocardial infarction of unspecified site: I21.3

## 2024-07-22 HISTORY — DX: Type 2 diabetes mellitus without complications: E11.9

## 2024-07-22 HISTORY — DX: Unspecified right bundle-branch block: I45.10

## 2024-07-22 LAB — TYPE AND SCREEN
ABO/RH(D): O POS
Antibody Screen: NEGATIVE

## 2024-07-22 NOTE — Patient Instructions (Addendum)
 Your procedure is scheduled on: Friday 07/26/24 Report to the Registration Desk on the 1st floor of the [facility]. To find out your arrival time, please call [phone] between 1PM - 3PM on: Thursday 07/25/24 If your arrival time is 6:00 am, do not arrive before that time as the [facility] entrance doors do not open until 6:00 am.  REMEMBER: Instructions that are not followed completely may result in serious medical risk, up to and including death; or upon the discretion of your surgeon and anesthesiologist your surgery may need to be rescheduled.  Do not eat food or drink any liquids after midnight the night before surgery.  No gum chewing or hard candies.  One week prior to surgery: Stop Anti-inflammatories (NSAIDS) such as Advil , Aleve, Ibuprofen , Motrin , Naproxen, Naprosyn and Aspirin  based products such as Excedrin, Goody's Powder, BC Powder.  You may however, continue to take Tylenol  if needed for pain up until the day of surgery.  Stop ANY OVER THE COUNTER supplements and vitamins until after surgery. (Your Multivitamin)    **Follow guidelines for insulin  and diabetes medications.** NO INSULIN  UNTIL AFTER YOUR PROCEDURE FRIDAY 07/26/24. DO NOT TAKE THE FARXIGA  FOR 3 DAYS (skip Tuesday Wednesday and Thursday)  **Follow recommendations regarding stopping blood thinners.** TAKE YOUR ASPIRIN  AS USUAL EXCEPT FOR FRIDAY 07/26/24  Continue taking all of your other prescription medications up until the day of surgery.  ON THE DAY OF SURGERY ONLY TAKE THESE MEDICATIONS WITH SIPS OF WATER :  atorvastatin  (LIPITOR ) 80 MG tablet  carvedilol  (COREG ) 1 1/2 tablets (37.5 mg total) cloNIDine  (CATAPRES ) 2 tablets (0.2 mg total) hydrALAZINE  (APRESOLINE ) 100 MG tablet  isosorbide  mononitrate (IMDUR ) 120 MG 24 hr tablet  omeprazole  (PRILOSEC) 40 MG capsule   Use inhalers on the day of surgery and bring to the hospital.  No Alcohol for 24 hours before or after surgery.  No Smoking  including e-cigarettes for 24 hours before surgery.  No chewable tobacco products for at least 6 hours before surgery.  No nicotine patches on the day of surgery.  Do not use any recreational drugs for at least a week (preferably 2 weeks) before your surgery.  Please be advised that the combination of cocaine and anesthesia may have negative outcomes, up to and including death. If you test positive for cocaine, your surgery will be cancelled.  On the morning of surgery brush your teeth with toothpaste and water , you may rinse your mouth with mouthwash if you wish. Do not swallow any toothpaste or mouthwash.  Use CHG Soap or wipes as directed on instruction sheet.  Do not wear lotions, powders, or cologne or deodorant the day of surgery..  Do not shave body hair from the neck down 48 hours before surgery.  Wear comfortable clothing (specific to your surgery type) to the hospital.  Do not wear jewelry, make-up, hairpins, clips or nail polish.  For welded (permanent) jewelry: bracelets, anklets, waist bands, etc.  Please have this removed prior to surgery.  If it is not removed, there is a chance that hospital personnel will need to cut it off on the day of surgery.  Contact lenses, hearing aids and dentures may not be worn into surgery.  Do not bring valuables to the hospital. [facility] is not responsible for any missing/lost belongings or valuables.   Notify your doctor if there is any change in your medical condition (cold, fever, infection).  After surgery, you can help prevent lung complications by doing breathing exercises.  Take  deep breaths and cough every 1-2 hours. Your doctor may order a device called an Incentive Spirometer to help you take deep breaths.  If you are being discharged the day of surgery, you will not be allowed to drive home. You will need a responsible individual to drive you home and stay with you for 24 hours after surgery.   Please call the  Pre-admissions Testing Dept. at [phone] if you have any questions about these instructions.  Surgery Visitation Policy:  Patients having surgery or a procedure may have two visitors.  Children under the age of 44 must have an adult with them who is not the patient.  Merchandiser, retail to address health-related social needs:  [email or web address]    Pre-operative 5 CHG Bath Instructions   You can play a key role in reducing the risk of infection after surgery. Your skin needs to be as free of germs as possible. You can reduce the number of germs on your skin by washing with CHG (chlorhexidine  gluconate) soap before surgery. CHG is an antiseptic soap that kills germs and continues to kill germs even after washing.   DO NOT use if you have an allergy to chlorhexidine /CHG or antibacterial soaps. If your skin becomes reddened or irritated, stop using the CHG and notify one of our RNs at [phone].   Please shower with the CHG soap starting 4 days before surgery using the following schedule:   Monday 07/22/24 - Friday 07/26/24    Please keep in mind the following:  DO NOT shave, including legs and underarms, starting the day of your first shower.   You may shave your face at any point before/day of surgery.  Place clean sheets on your bed the day you start using CHG soap. Use a clean washcloth (not used since being washed) for each shower. DO NOT sleep with pets once you start using the CHG.   CHG Shower Instructions:  If you choose to wash your hair and private area, wash first with your normal shampoo/soap.  After you use shampoo/soap, rinse your hair and body thoroughly to remove shampoo/soap residue.  Turn the water  OFF and apply about 3 tablespoons (45 ml) of CHG soap to a CLEAN washcloth.  Apply CHG soap ONLY FROM YOUR NECK DOWN TO YOUR TOES (washing for 3-5 minutes)  DO NOT use CHG soap on face, private areas, open wounds, or sores.  Pay special  attention to the area where your surgery is being performed.  If you are having back surgery, having someone wash your back for you may be helpful. Wait 2 minutes after CHG soap is applied, then you may rinse off the CHG soap.  Pat dry with a clean towel  Put on clean clothes/pajamas   If you choose to wear lotion, please use ONLY the CHG-compatible lotions on the back of this paper.     Additional instructions for the day of surgery: DO NOT APPLY any lotions, deodorants, cologne, or perfumes.   Put on clean/comfortable clothes.  Brush your teeth.  Ask your nurse before applying any prescription medications to the skin.      CHG Compatible Lotions   Aveeno Moisturizing lotion  Cetaphil Moisturizing Cream  Cetaphil Moisturizing Lotion  Clairol Herbal Essence Moisturizing Lotion, Dry Skin  Clairol Herbal Essence Moisturizing Lotion, Extra Dry Skin  Clairol Herbal Essence Moisturizing Lotion, Normal Skin  Curel Age Defying Therapeutic Moisturizing Lotion with Alpha Hydroxy  Curel Extreme Care Body Lotion  Curel Soothing Hands Moisturizing  Hand Lotion  Curel Therapeutic Moisturizing Cream, Fragrance-Free  Curel Therapeutic Moisturizing Lotion, Fragrance-Free  Curel Therapeutic Moisturizing Lotion, Original Formula  Eucerin Daily Replenishing Lotion  Eucerin Dry Skin Therapy Plus Alpha Hydroxy Crme  Eucerin Dry Skin Therapy Plus Alpha Hydroxy Lotion  Eucerin Original Crme  Eucerin Original Lotion  Eucerin Plus Crme Eucerin Plus Lotion  Eucerin TriLipid Replenishing Lotion  Keri Anti-Bacterial Hand Lotion  Keri Deep Conditioning Original Lotion Dry Skin Formula Softly Scented  Keri Deep Conditioning Original Lotion, Fragrance Free Sensitive Skin Formula  Keri Lotion Fast Absorbing Fragrance Free Sensitive Skin Formula  Keri Lotion Fast Absorbing Softly Scented Dry Skin Formula  Keri Original Lotion  Keri Skin Renewal Lotion Keri Silky Smooth Lotion  Keri Silky Smooth  Sensitive Skin Lotion  Nivea Body Creamy Conditioning Oil  Nivea Body Extra Enriched Teacher, adult education Moisturizing Lotion Nivea Crme  Nivea Skin Firming Lotion  NutraDerm 30 Skin Lotion  NutraDerm Skin Lotion  NutraDerm Therapeutic Skin Cream  NutraDerm Therapeutic Skin Lotion  ProShield Protective Hand Cream  Provon moisturizing lotion

## 2024-07-22 NOTE — Telephone Encounter (Signed)
 Patient is scheduled for pre-op  clearance on 07/25/24 with Damien Braver, NP. Patient was instructed to hold his Aspirin  for 1 day before procedure.

## 2024-07-22 NOTE — Telephone Encounter (Signed)
   Name: Erik [name]  DOB: [birth year]  MRN: [id number]  Primary Cardiologist: Lonni Hanson, MD  Preoperative team, please contact this patient and set up a phone call appointment for further preoperative risk assessment. Please obtain consent and complete medication review. Thank you for your help.  I confirm that guidance regarding antiplatelet and oral anticoagulation therapy has been completed and, if necessary, noted below.  Per request patient will be continued on aspirin  81 mg daily.   I also confirmed the patient resides in the state of [place] . As per [facility] telemedicine laws, the patient must reside in the state in which the provider is licensed.   Random Dobrowski D Choua Ikner, NP 07/22/2024, 10:02 AM [facility]

## 2024-07-22 NOTE — Telephone Encounter (Signed)
   Pre-operative Risk Assessment    Patient Name: Erik [name]  DOB: Jun 03, 1991 MRN: [id number]   Date of last office visit: 04/17/24 LONNI MEAGER, NP Date of next office visit: NONE   Request for Surgical Clearance    Procedure:  ARTERIOVENOUS (AV) FISTULA CREATION  Date of Surgery:  Clearance 07/26/24                                Surgeon:  Dr. Cordella Shawl, MD  Surgeon's Group or Practice Name:  [facility]  Phone number:  [phone] Fax number:  [phone]   Type of Clearance Requested:   - Medical  - Pharmacy:  Hold Aspirin  PER REQUEST: will be continuing daily low dose ASA   Type of Anesthesia:  General    Additional requests/questions:    Signed, Lucie DELENA Ku   07/22/2024, 9:01 AM    Elnor Dorise BRAVO, NP  P Cv Div Preop Callback Request for pre-operative cardiac clearance:   1. What type of surgery is being performed? ARTERIOVENOUS (AV) FISTULA CREATION  2. When is this surgery scheduled? 07/26/2024   3. Type of clearance being requested (medical, pharmacy, both)? MEDICAL   4. Are there any medications that need to be held prior to surgery? N/A - will be continuing daily low dose ASA  5. Practice name and name of physician performing surgery? Performing surgeon: Dr. Cordella Shawl, MD Requesting clearance: Dorise Elnor, FNP-C     6. Anesthesia type (none, local, MAC, general)? GENERAL  7. What is the office phone and fax number?   Phone: [phone] Fax: [phone]  ATTENTION: Unable to create telephone message as per your standard workflow. Directed by [facility] providers to send requests for cardiac clearance to this pool for appropriate distribution to provider covering pre-operative clearances.  Dorise Elnor, MSN, APRN, FNP-C, CEN [facility] Peri-operative Services Nurse Practitioner Phone: [phone] 07/19/24 11:37 PM

## 2024-07-22 NOTE — Telephone Encounter (Signed)
-----   Message from Erik [name] sent at 07/19/2024 11:37 PM EDT ----- Regarding: Request for pre-operative cardiac clearance Request for pre-operative cardiac clearance:  1. What type of surgery is being performed?  ARTERIOVENOUS (AV) FISTULA CREATION  2. When is this surgery scheduled?  07/26/2024  3. Type of clearance being requested (medical, pharmacy, both)? MEDICAL    4. Are there any medications that need to be held prior to surgery? N/A - will be continuing daily low dose ASA  5. Practice name and name of physician performing surgery?  Performing surgeon: Dr. Cordella Shawl, MD Requesting clearance: Erik Pereyra, FNP-C    6. Anesthesia type (none, local, MAC, general)? GENERAL  7. What is the office phone and fax number?   Phone: [phone] Fax: [phone]  ATTENTION: Unable to create telephone message as per your standard workflow. Directed by [facility] providers to send requests for cardiac clearance to this pool for appropriate distribution to provider covering pre-operative clearances.   Erik Pereyra, MSN, APRN, FNP-C, CEN [facility]  Peri-operative Services Nurse Practitioner Phone: [phone] 07/19/24 11:37 PM

## 2024-07-22 NOTE — Telephone Encounter (Signed)
  Patient Consent for Virtual Visit        Erik [name] has provided verbal consent on 07/22/2024 for a virtual visit (video or telephone).   CONSENT FOR VIRTUAL VISIT FOR:  Erik [name]  By participating in this virtual visit I agree to the following:  I hereby voluntarily request, consent and authorize [facility] and its employed or contracted physicians, physician assistants, nurse practitioners or other licensed health care professionals (the Practitioner), to provide me with telemedicine health care services (the "Services) as deemed necessary by the treating Practitioner. I acknowledge and consent to receive the Services by the Practitioner via telemedicine. I understand that the telemedicine visit will involve communicating with the Practitioner through live audiovisual communication technology and the disclosure of certain medical information by electronic transmission. I acknowledge that I have been given the opportunity to request an in-person assessment or other available alternative prior to the telemedicine visit and am voluntarily participating in the telemedicine visit.  I understand that I have the right to withhold or withdraw my consent to the use of telemedicine in the course of my care at any time, without affecting my right to future care or treatment, and that the Practitioner or I may terminate the telemedicine visit at any time. I understand that I have the right to inspect all information obtained and/or recorded in the course of the telemedicine visit and may receive copies of available information for a reasonable fee.  I understand that some of the potential risks of receiving the Services via telemedicine include:  Delay or interruption in medical evaluation due to technological equipment failure or disruption; Information transmitted may not be sufficient (e.g. poor resolution of images) to allow for appropriate medical decision making by the Practitioner;  and/or  In rare instances, security protocols could fail, causing a breach of personal health information.  Furthermore, I acknowledge that it is my responsibility to provide information about my medical history, conditions and care that is complete and accurate to the best of my ability. I acknowledge that Practitioner's advice, recommendations, and/or decision may be based on factors not within their control, such as incomplete or inaccurate data provided by me or distortions of diagnostic images or specimens that may result from electronic transmissions. I understand that the practice of medicine is not an exact science and that Practitioner makes no warranties or guarantees regarding treatment outcomes. I acknowledge that a copy of this consent can be made available to me via my patient portal [facility] MyChart), or I can request a printed copy by calling the office of  [facility].    I understand that my insurance will be billed for this visit.   I have read or had this consent read to me. I understand the contents of this consent, which adequately explains the benefits and risks of the Services being provided via telemedicine.  I have been provided ample opportunity to ask questions regarding this consent and the Services and have had my questions answered to my satisfaction. I give my informed consent for the services to be provided through the use of telemedicine in my medical care

## 2024-07-24 ENCOUNTER — Ambulatory Visit: Payer: Self-pay | Attending: Nurse Practitioner | Admitting: Nurse Practitioner

## 2024-07-24 ENCOUNTER — Encounter: Payer: Self-pay | Admitting: Nurse Practitioner

## 2024-07-24 VITALS — BP 142/92 | HR 91 | Ht 70.0 in | Wt >= 6400 oz

## 2024-07-24 DIAGNOSIS — E66813 Obesity, class 3: Secondary | ICD-10-CM

## 2024-07-24 DIAGNOSIS — Z6841 Body Mass Index (BMI) 40.0 and over, adult: Secondary | ICD-10-CM

## 2024-07-24 DIAGNOSIS — E118 Type 2 diabetes mellitus with unspecified complications: Secondary | ICD-10-CM

## 2024-07-24 DIAGNOSIS — I10 Essential (primary) hypertension: Secondary | ICD-10-CM

## 2024-07-24 DIAGNOSIS — I2510 Atherosclerotic heart disease of native coronary artery without angina pectoris: Secondary | ICD-10-CM

## 2024-07-24 DIAGNOSIS — I428 Other cardiomyopathies: Secondary | ICD-10-CM

## 2024-07-24 DIAGNOSIS — Z794 Long term (current) use of insulin: Secondary | ICD-10-CM

## 2024-07-24 DIAGNOSIS — Z01810 Encounter for preprocedural cardiovascular examination: Secondary | ICD-10-CM

## 2024-07-24 DIAGNOSIS — N184 Chronic kidney disease, stage 4 (severe): Secondary | ICD-10-CM

## 2024-07-24 DIAGNOSIS — G4730 Sleep apnea, unspecified: Secondary | ICD-10-CM

## 2024-07-24 DIAGNOSIS — Z8679 Personal history of other diseases of the circulatory system: Secondary | ICD-10-CM

## 2024-07-24 DIAGNOSIS — I472 Ventricular tachycardia, unspecified: Secondary | ICD-10-CM

## 2024-07-24 DIAGNOSIS — I5032 Chronic diastolic (congestive) heart failure: Secondary | ICD-10-CM

## 2024-07-24 MED ORDER — CARVEDILOL 25 MG PO TABS
50.0000 mg | ORAL_TABLET | Freq: Two times a day (BID) | ORAL | Status: AC
Start: 2024-07-24 — End: ?

## 2024-07-24 NOTE — Progress Notes (Signed)
 Office Visit    Patient Name: Erik [name] Date of Encounter: 07/24/2024  Primary Care Provider:  Delbert Clam, MD Primary Cardiologist:  Erik Hanson, MD    Chief Complaint    33 y.o. male with a history of nonischemic cardiomyopathy, heart failure with improved ejection fraction, COVID-19 complicated by ARDS, ventricular tachycardia, and acute kidney injury in February 2022, nonobstructive CAD, type 2 diabetes mellitus, asthma, morbid obesity, and progressive renal failure (CKD 4), who presents for heart failure and hypertension follow-up.   Past Medical History   Subjective   Past Medical History:  Diagnosis Date   ARDS survivor    a. 01/2021 in setting of COVID PNA.   Asthma    BMI 60.0-69.9, adult (HCC)    CKD (chronic kidney disease), stage IV (HCC)    a. AKI 01/2021 in setting of COVID.   COVID-19 virus infection 01/2021   Heart failure with improved ejection fraction (HFimpEF) (HCC)    a. 01/2021 LV gram: EF 35-45%; b. 01/2021 Echo: EF 60-65%; c. 09/2021 Echo: EF 55% (51.8% 2D MOD biplane).   Incomplete RBBB    Insulin  dependent type 2 diabetes mellitus (HCC)    Microcytic anemia    Myocarditis (HCC)    a. 01/2021 in setting of COVID PNA.   NICM (nonischemic cardiomyopathy) (HCC)    a. 01/2021 LV gram: EF 35-45%; b. 01/2021 Echo: EF 60-65%, no rwma, nl RV fxn; c. 09/2021 Echo: EF 55% (51.8% 2D MOD biplane). No rwma, nl RV fxn w/ mild enlargement.   Non-obstructive CAD (coronary artery disease)    a. 01/2021 Cath: LM nl, LAD nl, RI nl, LCX nl, LPAV mild dzs, RCA nl, RPDA min irregs. EF 35-45%.   ST elevation myocardial infarction (STEMI) (HCC)    Super-super obese (HCC)    Ventricular tachycardia (HCC)    a. 01/2021 in setting of myocarditis/COVID.   Past Surgical History:  Procedure Laterality Date   CARDIAC CATHETERIZATION     LEFT HEART CATH AND CORONARY ANGIOGRAPHY N/A 01/20/2021   Procedure: LEFT HEART CATH AND CORONARY ANGIOGRAPHY;  Surgeon: [name] Lonni, MD;  Location: [facility] INV[facility]IVE [facility];  Service: Cardiovascular;  Laterality: N/A;    Allergies  No Known Allergies     History of Present Illness      33 y.o. y/o male with a history of nonischemic cardiomyopathy, heart failure with improved ejection fraction, COVID-19 complicated by ARDS, ventricular tachycardia, and acute kidney injury in February 2022, nonobstructive CAD, type 2 diabetes mellitus, asthma, morbid obesity, and progressive renal failure (CKD 4).  In February 2022, patient was admitted with COVID-19 pneumonia, ARDS, severe acidosis/hypoxia requiring intubation, and acute kidney injury requiring CRRT. He was treated with remdesivir  and steroids. During hospitalization, he developed ST segment elevation and ventricular tachycardia. Diagnostic catheterization showed minimal nonobstructive CAD with an EF of 35 to 45% by ventriculography. Subsequent echocardiogram showed normal LV function at 60 to 65% without regional wall motion abnormalities. VT was initially treated with amiodarone. He had a prolonged hospital stay and was subsequently discharged to rehab for nearly a month prior to being rehospitalized in late March 2022, due to hypoxic respiratory failure, pneumonia, and sepsis. He was also anemic during hospitalization and required 1 unit of packed red blood cells. He underwent repeat echocardiography in October 2022, in the setting of lower extremity swelling, which showed an EF of 55% without regional wall motion abnormalities, mild LVH, and no significant valvular disease.     Erik [name]  was last seen in cardiology clinic in May 2025, at which time he remained hypertensive on carvedilol , hydralazine , long-acting nitrate, and clonidine  therapy.  Clonidine  was increased to 0.2 mg twice daily at that time.  He subsequently followed up with vascular surgery in June 2025 with plan for AV fistula creation, planned for August 15.  Patient recently provided for preadmission testing  on August 11, and was noted to be hypertensive at 189/108, and his ECG showed a more pronounced inferolateral T wave inversion the noted on prior ECGs.  Recommendation was made at that point for him to follow-up with cardiology preoperative eval and blood pressure management.    Today, Erik [name] says that he had not taken his blood pressure medicines yet yesterday, when he was evaluated.  He notes that he has been doing reasonably well.  Blood pressure better today at 148/89 after taking medicines as scheduled this morning.  He is sedentary but is capable of achieving greater than 5.5 METS, such as walking up a flight of stairs.  He denies chest pain, dyspnea, palpitations, PND, orthopnea, dizziness, syncope, edema, or early satiety. Objective   Home Medications    Current Outpatient Medications  Medication Sig Dispense Refill   albuterol  (VENTOLIN  HFA) 108 (90 Base) MCG/ACT inhaler INHALE 1-2 PUFFS INTO THE LUNGS EVERY SIX HOURS AS NEEDED FOR WHEEZING OR SHORTNESS OF BREATH. 18 g 1   allopurinol  (ZYLOPRIM ) 100 MG tablet Take 1 tablet (100 mg total) by mouth daily. 30 tablet 11   aspirin  81 MG chewable tablet Chew 1 tablet (81 mg total) by mouth daily. 100 tablet 0   atorvastatin  (LIPITOR ) 80 MG tablet Take 1 tablet (80 mg total) by mouth daily. 90 tablet 1   blood glucose meter kit and supplies KIT Dispense based on patient and insurance preference. Use up to four times daily as directed. 1 each 0   calcitRIOL  (ROCALTROL ) 0.25 MCG capsule Take 1 capsule (0.25 mcg total) by mouth daily. 30 capsule 11   calcitRIOL  (ROCALTROL ) 0.25 MCG capsule Take 1 capsule (0.25 mcg total) by mouth daily. 90 capsule 3   cloNIDine  (CATAPRES ) 0.1 MG tablet Take 2 tablets (0.2 mg total) by mouth every morning AND 2 tablets (0.2 mg total) at bedtime. 120 tablet 11   dapagliflozin  propanediol (FARXIGA ) 10 MG TABS tablet Take 1 tablet (10 mg total) by mouth in the morning. 30 tablet 11   famotidine  (PEPCID ) 20 MG tablet  TAKE 1 TABLET (20 MG TOTAL) BY MOUTH DAILY. 90 tablet 2   ferrous sulfate  325 (65 FE) MG tablet Take 1 tablet (325 mg total) by mouth daily with breakfast. 90 tablet 3   hydrALAZINE  (APRESOLINE ) 100 MG tablet Take 1 tablet (100 mg total) by mouth 3 (three) times daily. 270 tablet 1   Insulin  Glargine (BASAGLAR  KWIKPEN) 100 UNIT/ML Inject 16 Units into the skin daily. 6 mL 2   insulin  lispro (HUMALOG  KWIKPEN) 100 UNIT/ML KwikPen Inject 8 Units into the skin 3 (three) times daily. 6 mL 6   Insulin  Pen Needle 31G X 6 MM MISC Use as directed with breakfast, with lunch, and with evening meal. 120 each 6   isosorbide  mononitrate (IMDUR ) 120 MG 24 hr tablet Take 1 tablet (120 mg total) by mouth daily. 90 tablet 1   Multiple Vitamin (MULTIVITAMIN WITH MINERALS) TABS tablet Take 1 tablet by mouth daily.     omeprazole  (PRILOSEC) 40 MG capsule Take 1 capsule (40 mg total) by mouth daily. 90 capsule 1  potassium chloride  (KLOR-CON ) 10 MEQ tablet Take 1 tablet (10 mEq total) by mouth 2 (two) times daily. 180 tablet 1   Semaglutide , 2 MG/DOSE, (OZEMPIC , 2 MG/DOSE,) 8 MG/3ML SOPN Inject 2 mg as directed once a week. 3 mL 6   sodium bicarbonate  650 MG tablet Take 2 tablets (1,300 mg total) by mouth in the morning AND 2 tablets (1,300 mg total) every evening. 120 tablet 11   torsemide  (DEMADEX ) 20 MG tablet Take 1 tablet (20 mg total) by mouth daily. 90 tablet 1   carvedilol  (COREG ) 25 MG tablet Take 2 tablets (50 mg total) by mouth 2 (two) times daily.     No current facility-administered medications for this visit.     Physical Exam    VS:  BP (!) 142/92   Pulse 91   Ht 5' 10 (1.778 m)   Wt (!) 476 lb (215.9 kg)   SpO2 96%   BMI 68.30 kg/m  , BMI Body mass index is 68.3 kg/m.    Vitals:   07/24/24 1506 07/24/24 1645  BP: (!) 148/89 (!) 142/92  Pulse: 91   SpO2: 96%           GEN: Obese, in no acute distress. HEENT: normal. Neck: Supple, obese, difficult to gauge JVP.  No bruits or masses.   Cardiac: RRR, no murmurs, rubs, or gallops. No clubbing, cyanosis, edema.  Radials 2+/PT 2+ and equal bilaterally.  Respiratory:  Respirations regular and unlabored, clear to auscultation bilaterally. GI: Obese, soft, nontender, nondistended, BS + x 4. MS: no deformity or atrophy. Skin: warm and dry, no rash. Neuro:  Strength and sensation are intact. Psych: Normal affect.  Accessory Clinical Findings    ECG personally reviewed by me today - EKG Interpretation Date/Time:  Wednesday July 24 2024 15:15:30 EDT Ventricular Rate:  91 PR Interval:  174 QRS Duration:  98 QT Interval:  368 QTC Calculation: 452 R Axis:   12  Text Interpretation: Normal sinus rhythm Normal ECG Rightward axis Confirmed by Vivienne Bruckner [phone]) on 07/24/2024 3:22:24 PM  - no acute changes.  Lab Results  Component Value Date   CHOL 104 11/29/2023   HDL 47 11/29/2023   LDLCALC 41 11/29/2023   TRIG 80 11/29/2023   CHOLHDL 3.6 05/18/2022    Labs dated June 05, 2024 from [facility]:  Hemoglobin 10.1, hematocrit 33.9, WBC 8.2, platelets 261 Sodium 140, potassium 4.3, chloride 113, CO2 16, BUN 39, creatinine 3.47, glucose 168 Calcium  7.8, phosphorus 4.8, albumin 3.6    Assessment & Plan    1.  Preoperative cardiovascular evaluation: Patient with a history of nonischemic cardiomyopathy and chronic heart failure with improved ejection fraction with EF of 60-65% by echo in October 2022.  In the setting of progressive renal failure, he is pending an AV fistula and was noted to be markedly hypertensive at preadmission testing visit yesterday.  It turns out, he had not yet taken his morning blood pressure medicines.  Today, blood pressure is elevated but much better controlled at 142/92.  He is capable of achieving at least 5.5 METS, and his RCRI calculates to a 6.6% risk of MACE perioperatively in the setting of history of heart failure and elevated creatinine.  He has been doing well without chest pain or  dyspnea and his blood pressure is stable today.  He will not require additional ischemic evaluation prior to planned AV fistula on Friday.  He should continue low-dose aspirin , beta-blocker, and statin therapy throughout the perioperative period.  2.  Primary hypertension: Blood pressure elevated but overall improved compared to historical readings at 142/92 today.  I am going to have him increase his carvedilol  to 50 mg twice daily, up from 37.5 mg twice daily.  He remains on clonidine  0.2 mg twice daily, hydralazine  100 mg 3 times daily, and isosorbide  mononitrate 120 mg daily.  Could consider addition of amlodipine  in the future if necessary.   3.  Stage IV chronic kidney disease: Pending AV fistula placement later this week.  4.  Nonobstructive CAD: Status post catheterization in the setting of VT and myocarditis in February 2022 with minimal nonobstructive branch disease.  He does not experience chest pain or dyspnea.  He remains on aspirin , statin, and beta-blocker therapy.  5.  History of myocarditis/ventricular tachycardia: In the setting of COVID-19 infection in February 2022.  EF has since improved.  He remains on beta-blocker therapy.  6.  Type 2 diabetes mellitus: Followed by primary care.  He is on Humalog  and ozempic .  7.  Morbid obesity/BMI greater than 65: Patient is a significant backslide in his dietary and activity habits, noting that he is more or less sedentary at this point as it is too hot outside.  He remains on Ozempic  but has not lost significant weight.    8.  Anemia of chronic disease, stable in June.  9.  Obstructive sleep apnea: Previously evaluated by pulmonology with recommendation for sleep study but he has not completed.  10.  Disposition: Follow-up in clinic in 3 months or sooner if necessary.  Erik Meager, NP 07/24/2024, 4:45 PM

## 2024-07-24 NOTE — Patient Instructions (Signed)
 Medication Instructions:  Start taking Carvedilol  2 tablets (50 mg) twice daily   *If you need a refill on your cardiac medications before your next appointment, please call your pharmacy*  Follow-Up: At [facility], you and your health needs are our priority.  As part of our continuing mission to provide you with exceptional heart care, our providers are all part of one team.  This team includes your primary Cardiologist (physician) and Advanced Practice Providers or APPs (Physician Assistants and Nurse Practitioners) who all work together to provide you with the care you need, when you need it.  Your next appointment:   3 month(s)  Provider:   Lonni Hanson, MD    We recommend signing up for the patient portal called MyChart.  Sign up information is provided on this After Visit Summary.  MyChart is used to connect with patients for Virtual Visits (Telemedicine).  Patients are able to view lab/test results, encounter notes, upcoming appointments, etc.  Non-urgent messages can be sent to your provider as well.   To learn more about what you can do with MyChart, go to [email or web address].

## 2024-07-25 ENCOUNTER — Encounter: Payer: Self-pay | Admitting: Vascular Surgery

## 2024-07-25 ENCOUNTER — Ambulatory Visit: Payer: Self-pay

## 2024-07-25 MED ORDER — CEFAZOLIN SODIUM-DEXTROSE 2-4 GM/100ML-% IV SOLN: 2.0000 g | INTRAVENOUS | Status: DC

## 2024-07-25 MED ORDER — ORAL CARE MOUTH RINSE: 15.0000 mL | Freq: Once | OROMUCOSAL | Status: AC

## 2024-07-25 MED ORDER — CHLORHEXIDINE GLUCONATE 0.12 % MT SOLN
15.0000 mL | Freq: Once | OROMUCOSAL | Status: AC
  Administered 2024-07-26: 15 mL via OROMUCOSAL

## 2024-07-25 MED ORDER — SODIUM CHLORIDE 0.9 % IV SOLN: INTRAVENOUS | Status: DC

## 2024-07-25 MED ORDER — CHLORHEXIDINE GLUCONATE CLOTH 2 % EX PADS: 6.0000 | MEDICATED_PAD | Freq: Once | CUTANEOUS | Status: DC

## 2024-07-25 MED ORDER — CHLORHEXIDINE GLUCONATE CLOTH 2 % EX PADS
6.0000 | MEDICATED_PAD | Freq: Once | CUTANEOUS | Status: AC
  Administered 2024-07-26: 6 via TOPICAL

## 2024-07-25 NOTE — Progress Notes (Signed)
 Perioperative / Anesthesia Services  Pre-Admission Testing Clinical Review / Pre-Operative Anesthesia Consult  Date: 07/25/24  PATIENT DEMOGRAPHICS: Name: Erik [name] DOB: [birth year] MRN:   [id number]  Note: Available PAT nursing documentation and vital signs have been reviewed. Clinical nursing staff has updated patient's PMH/PSHx, current medication list, and drug allergies/intolerances to ensure complete and comprehensive history available to assist care teams in MDM as it pertains to the aforementioned surgical procedure and anticipated anesthetic course. Extensive review of available clinical information personally performed. [place] [facility] and PSHx updated with any diagnoses/procedures that  may have been inadvertently omitted during his intake with the pre-admission testing department's nursing staff.  PLANNED SURGICAL PROCEDURE(S):   Case: [id number] Date/Time: 07/26/24 0715   Procedure: ARTERIOVENOUS (AV) FISTULA CREATION (Right) - RADIALCEPHALIC   Anesthesia type: General   Diagnosis: Chronic renal disease, stage IV (HCC) [N18.4]   Pre-op  diagnosis: CHRONIC KIDNEY DISEASE STAGE IV   Location: [facility] OR ROOM 08 / [facility] ORS FOR ANESTHESIA GROUP   Surgeons: Jama Cordella MATSU, MD        CLINICAL DISCUSSION: Erik [name] is a 33 y.o. male who is submitted for pre-surgical anesthesia review and clearance prior to him undergoing the above procedure. Patient has never been a smoker in the past. 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  Patient is followed by cardiology (End, MD). He was last seen in the cardiology clinic on 07/24/2024; notes reviewed. At the time of his clinic visit, patient doing well  overall from a cardiovascular perspective. Patient denied any chest pain, shortness of breath, PND, orthopnea, palpitations, significant peripheral edema, weakness, fatigue, vertiginous symptoms, or presyncope/syncope. Patient with a past medical history significant for cardiovascular diagnoses. Documented physical exam was grossly benign, providing no evidence of acute exacerbation and/or decompensation of the patient's known cardiovascular conditions.  The patient developed SARS-CoV-2 infection and 01/2021 with progression to viral pneumonia with cardiovascular, cardiopulmonary, and renovascular complications, including myocarditis, ARDS, STEMI, sustained ventricular tachycardia, and renal failure. Patient required extended admission from 01/12/2021 through 02/03/2021.  He required CRRT during his admission due to progressive renal failure.  CT imaging revealed no evidence of pulmonary embolism.  Cardiovascular testing was performed. Patient discharged to a rehab facility where he stayed from 02/03/2021 through 02/26/2021.    Patient underwent diagnostic LEFT heart catheterization on 01/20/2021.  Study revealed moderately reduced left ventricular systolic function with an EF of 35-45%.  There was global hypokinesis.  LVEDP elevated at 20 mmHg.  Minimal coronary artery plaquing without angiographically significant coronary artery disease in the LPAV and in the RPDA.  Given the nonobstructive nature of his coronary artery disease, no intervention was required.  Most recent TTE performed on 09/22/2021 revealed a normal left ventricular systolic function with an EF of 55%. There was mild LVH.  There were no regional wall motion abnormalities.  Left ventricular diastolic Doppler parameters were indeterminant.  Right ventricle is mildly enlarged with normal systolic function; TAPSE = 2.4 cm (normal range >/= 1.6 cm).  Right atrial pressure = 8 mmHg. There was no significant valvular regurgitation.  All  transvalvular gradients were noted to be normal providing no evidence of hemodynamically significant valvular stenosis. Aorta normal in size with no evidence of ectasia or aneurysmal dilatation.  Patient had previously been seen and the PAT clinic  on 07/22/2024, at which time his blood pressure was significantly elevated at 189/108 mmHg.  Patient advised cardiology that he had not taken his antihypertensive medications prior to that visit.  Blood pressure in the cardiology clinic under much better control at 142/92 mmHg on prescribed beta-blocker (carvedilol ), alpha-blocker (clonidine ), vasodilator (hydralazine ), diuretic (torsemide ) and nitrate (isosorbide  mononitrate).  Patient was taking atorvastatin  for his HLD diagnosis and further ASCVD prevention.  T2DM well-controlled on currently prescribed regimen; last HgbA1c was 6.6% when checked on 11/27/2023.  In the setting of known cardiovascular diagnoses and concurrent T2DM, patient on a SGLT2i (dapagliflozin ) for added cardiovascular and renovascular protection.  Patient does not have an OSAH diagnosis.  Patient maintains an overall sedentary lifestyle, which contributes to his super obesity.  With that said, patient able to complete all of his ADLs/IADLs without significant cardiovascular limitation.  Patient able to climb a flight of stairs without difficulty.  Per the DASI, patient able to achieve >4 METS of physical activity without experiencing any significant angina/anginal equivalent symptoms.  Given his elevated blood pressures in the PAT and cardiology clinics changes were made to his antihypertensive regimen; carvedilol  increased to 50 mg twice daily and clonidine  0.2 mg increased to twice daily. Patient remains on hydralazine  100 mg 3 times daily and isosorbide  mononitrate 120 mg daily.  Cardiology provider noted that addition of CCB (amlodipine ) could be considered in the future for persistent hypertension.  No other changes were made to his  medication regimen.  Patient to follow-up with outpatient cardiology and 3 months or sooner if needed.  Erik [name] is scheduled for ARTERIOVENOUS (AV) FISTULA CREATION (Right) on 07/26/2024 with Dr. Cordella Shawl, MD.  Given patient's past medical history significant for cardiovascular diagnoses, presurgical cardiac clearance was sought by the PAT team.  Per cardiology, patient with a history of nonischemic cardiomyopathy and chronic heart failure with improved ejection fraction with EF of 60-65% by echo in October 2022.  In the setting of progressive renal failure, he is pending an AV fistula and was noted to be markedly hypertensive at preadmission testing visit yesterday.  It turns out, he had not yet taken his morning blood pressure medicines.  Today, blood pressure is elevated but much better controlled at 142/92.  He is capable of achieving at least 5.5 METS, and his RCRI calculates to a 6.6% risk of MACE perioperatively in the setting of history of heart failure and elevated creatinine.  He has been doing well without chest pain or dyspnea and his blood pressure is stable today.  He will not require additional ischemic evaluation prior to planned AV fistula on Friday.  In review of the patient's chart, it is noted that he is on daily oral antithrombotic therapy. Given that patient's past medical history is significant for cardiovascular diagnoses, including but not limited to CAD, vascular surgery has cleared patient to continue his daily low dose ASA throughout his perioperative course.  Patient has been updated on these directives from his specialty care providers by the PAT team.  Patient denies previous perioperative complications with anesthesia in the past. In review his EMR, there are no records available for review pertaining to any anesthetic courses within the [facility] in the recent past.   MOST RECENT VITAL SIGNS:    07/24/2024    4:45 PM 07/24/2024    3:06 PM 07/22/2024     8:11 AM  Vitals with BMI  Height  5' 10 5' 10  Weight  476 lbs 476 lbs  BMI  68.3 68.3  Systolic 142 148 810  Diastolic 92 89 108  Pulse  91 90   PROVIDERS/SPECIALISTS: NOTE: Primary physician provider listed below. Patient may have been seen by APP or partner within same practice.   PROVIDER ROLE / SPECIALTY LAST OV  Schnier, Cordella MATSU, MD Vascular Surgery (Surgeon) 05/20/2024  Delbert Clam, MD Primary Care Provider 11/27/2023  End, Lonni, MD Cardiology 07/24/2024  Dennise Capri, MD Nephrology 06/10/2024   ALLERGIES: No Known Allergies  CURRENT HOME MEDICATIONS: No current facility-administered medications for this encounter.    albuterol  (VENTOLIN  HFA) 108 (90 Base) MCG/ACT inhaler   allopurinol  (ZYLOPRIM ) 100 MG tablet   aspirin  81 MG chewable tablet   atorvastatin  (LIPITOR ) 80 MG tablet   blood glucose meter kit and supplies KIT   calcitRIOL  (ROCALTROL ) 0.25 MCG capsule   calcitRIOL  (ROCALTROL ) 0.25 MCG capsule   carvedilol  (COREG ) 25 MG tablet   cloNIDine  (CATAPRES ) 0.1 MG tablet   dapagliflozin  propanediol (FARXIGA ) 10 MG TABS tablet   famotidine  (PEPCID ) 20 MG tablet   ferrous sulfate  325 (65 FE) MG tablet   hydrALAZINE  (APRESOLINE ) 100 MG tablet   Insulin  Glargine (BASAGLAR  KWIKPEN) 100 UNIT/ML   insulin  lispro (HUMALOG  KWIKPEN) 100 UNIT/ML KwikPen   Insulin  Pen Needle 31G X 6 MM MISC   isosorbide  mononitrate (IMDUR ) 120 MG 24 hr tablet   Multiple Vitamin (MULTIVITAMIN WITH MINERALS) TABS tablet   omeprazole  (PRILOSEC) 40 MG capsule   potassium chloride  (KLOR-CON ) 10 MEQ tablet   Semaglutide , 2 MG/DOSE, (OZEMPIC , 2 MG/DOSE,) 8 MG/3ML SOPN   sodium bicarbonate  650 MG tablet   torsemide  (DEMADEX ) 20 MG tablet   HISTORY: Past Medical History:  Diagnosis Date   ARDS survivor    a. 01/2021 in setting of COVID PNA.   Asthma    CKD (chronic kidney disease), stage IV (HCC)    a.) AKI 01/2021 in setting of COVID; initiated on CRRT during extended  admission   COVID-19 virus infection 01/2021   Heart failure with improved ejection fraction (HFimpEF) (HCC)    a. 01/2021 LV gram: EF 35-45%; b. 01/2021 Echo: EF 60-65%; c. 09/2021 Echo: EF 55% (51.8% 2D MOD biplane).   HLD (hyperlipidemia)    HTN (hypertension)    Incomplete RBBB    Insulin  dependent type 2 diabetes mellitus (HCC)    Long-term use of aspirin  therapy    Microcytic anemia    Myocarditis (HCC)    a. 01/2021 in setting of COVID PNA.   NICM (nonischemic cardiomyopathy) (HCC)    a. 01/2021 LV gram: EF 35-45%; b. 01/2021 Echo: EF 60-65%, no rwma, nl RV fxn; c. 09/2021 Echo: EF 55% (51.8% 2D MOD biplane). No rwma, nl RV fxn w/ mild enlargement.   Non-obstructive CAD (coronary artery disease)    a. 01/2021 Cath: LM nl, LAD nl, RI nl, LCX nl, LPAV mild dzs, RCA nl, RPDA min irregs. EF 35-45%.   ST elevation myocardial infarction (STEMI) (HCC)    Super-super obese (HCC)    T2DM (type 2 diabetes mellitus) (HCC)    Ventricular tachycardia (HCC)    a. 01/2021 in setting of myocarditis/COVID.   Past Surgical History:  Procedure Laterality Date   LEFT HEART CATH AND CORONARY ANGIOGRAPHY N/A 01/20/2021   Procedure: LEFT HEART CATH AND CORONARY ANGIOGRAPHY;  Surgeon: Mady Lonni, MD;  Location: [facility] INVASIVE CV LAB;  Service: Cardiovascular;  Laterality: N/A;   Family History  Problem Relation Age of Onset   Hypertension Mother    Kidney disease  Mother    Heart Problems Maternal Grandmother    Heart attack Maternal Grandmother    Heart disease Maternal Grandmother    Social History   Tobacco Use   Smoking status: Never   Smokeless tobacco: Never  Substance Use Topics   Alcohol use: Never    LABS:  Component Ref Range & Units 06/05/2024  WBC 3.8 - 10.8 Thousand/uL 8.2  RBC 4.20 - 5.80 Million/uL 4.03 Low   Hemoglobin 13.2 - 17.1 g/dL 89.8 Low   Hematocrit 61.4 - 50.0 % 33.9 Low   MCV 80.0 - 100.0 fL 84.1  MCH 27.0 - 33.0 pg 25.1 Low   MCHC 32.0 - 36.0 g/dL  70.1 Low   RDW 88.9 - 15.0 % 15.4 High   Platelets 140 - 400 Thousand/uL 261  MPV 7.5 - 12.5 fL 11.8  Neutrophils Absolute 1500 - 7800 cells/uL 5,346  Lymphocytes Absolute 850 - 3900 cells/uL 1,632  Monocytes Absolute 200 - 950 cells/uL 828  Eosinophils Absolute 15 - 500 cells/uL 353  Basophils Absolute 0 - 200 cells/uL 41  Neutrophils Relative % 65.2  Lymphocytes % 19.9  Monocytes % 10.1  Eosinophils % 4.3  Basophils Relative % 0.5  Resulting Agency [facility]   Component Ref Range & Units 06/05/2024  Glucose 65 - 99 mg/dL 831 High   BUN 7 - 25 mg/dL 39 High   Creatinine 9.39 - 1.26 mg/dL 6.52 High   eGFR CKD-EPI CR 2021 > OR = 60 mL/min/1.1m2 23 Low   BUN/Creatinine Ratio 6 - 22 (calc) 11  Sodium 135 - 146 mmol/L 140  Potassium 3.5 - 5.3 mmol/L 4.3  Chloride 98 - 110 mmol/L 113 High   Bicarbonate (CO2) 20 - 32 mmol/L 16 Low   Calcium  8.6 - 10.3 mg/dL 7.8 Low   Phosphorus 2.5 - 4.5 mg/dL 4.8 High   Albumin 3.6 - 5.1 g/dL 3.6  Resulting Agency [facility] Outpatient Visit on 07/22/2024  Component Date Value Ref Range Status   ABO/RH(D) 07/22/2024 O POS   Final   Antibody Screen 07/22/2024 NEG   Final   Sample Expiration 07/22/2024 08/05/2024,2359   Final   Extend sample reason 07/22/2024    Final                   Value:NO TRANSFUSIONS OR PREGNANCY IN THE PAST 3 MONTHS Performed at [facility], [address] [place], [place] [address]     ECG: Date: 07/22/2024  Time ECG obtained: 0918 AM Rate: 86 bpm Rhythm: Normal sinus rhythm; IRBBB Axis (leads I and aVF): right Intervals: PR 174 ms. QRS 100 ms. QTc 437 ms. ST segment and T wave changes: Inferolateral T wave abnormalities Evidence of a possible, age undetermined, prior infarct:  No Comparison: ECG obtained on 01/12/2024 showed normal sinus rhythm at a rate of 96 bpm.  Minimal T wave abnormality noted inferiorly.   IMAGING /  PROCEDURES: TRANSTHORACIC ECHOCARDIOGRAM performed on 09/22/2021 Left ventricular ejection fraction, by estimation, is 55%. Left ventricular ejection fraction by 2D MOD biplane is 51.8 %. The left ventricle has low normal function. The left ventricle has no regional wall motion abnormalities. There is mild left ventricular hypertrophy. Left ventricular diastolic parameters are indeterminate.  Right ventricular systolic function is normal. The right ventricular size is mildly enlarged.  The mitral valve is normal in structure. No evidence of mitral valve regurgitation.  The aortic valve was not well visualized. Aortic valve regurgitation is not visualized.  The inferior vena cava is dilated in size with >50% respiratory variability, suggesting right atrial pressure of 8 mmHg.   CT ANGIO CHEST PE W AND/OR WO CONTRAST performed on 03/07/2021 No definite central pulmonary embolus with markedly limited evaluation distally due to timing of contrast and respiratory motion artifact Diffuse peribronchovascular airspace opacities. Finding likely represents pulmonary edema versus infection/inflammation. Differential diagnosis also includes less likely alveolar hemorrhage given upper lobe predominance. Bilateral trace to small volume pleural effusions.  LEFT HEART CATHETERIZATION AND CORONARY ANGIOGRAPHY performed on 01/20/2021 Minimal coronary artery plaquing without angiographically significant coronary artery disease. Moderately reduced left ventricular contraction with global hypokinesis. Mildly elevated left ventricular filling pressure (LVEDP ~20 mmHg). Recommendations: No obvious coronary artery lesion to explain ST segment changes and sustained ventricular tachycardia.  Possible causes include metabolic/electrolyte derangements and myocarditis in the setting of COVID-19. Trend HS-TnI until it has peaked, then stop. Obtain transthoracic echocardiogram. If blood pressure tolerates, consider addition  of low-dose beta-blocker. If patient has recurrent VT, consider IV amiodarone. Replete electrolytes to maintain K > 4.0 and Mg > 2.0.    IMPRESSION AND PLAN: DRADEN COTTINGHAM has been referred for pre-anesthesia review and clearance prior to him undergoing the planned anesthetic and procedural courses. Available labs, pertinent testing, and imaging results were personally reviewed by me in preparation for upcoming operative/procedural course. [facility] medical record has been updated following extensive record review and patient interview with PAT staff.   Given patient's super obesity with BMI 68.30 kg/m, patient was discussed with attending anesthesiologist on-call Abe, MD).  Discussed past medical history, case details, and elevated blood pressure.  Per anesthesia, patient will require clearance from cardiology.  Blood pressure will need to be addressed.  I did reach out to patient's primary care provider.  Due to the fact that she has not seen him since 11/2023, PCP unable to provide clearance.  Patient initially scheduled for cardiology telephone visit.  Given his ECG changes and significantly elevated blood pressure, I reached out to cardiology and was able to secure patient an in person appointment with Vivienne, NP.  Appreciate willingness from cardiology to see patient in expedited consult.  Blood pressure much better controlled during office visit.  This patient has been appropriately cleared by cardiology with an overall ACCEPTABLE risk of patient experiencing significant perioperative cardiovascular complications. Based on clinical review performed today (07/25/24), barring any significant acute changes in the patient's overall condition, it is anticipated that he will be able to proceed with the planned surgical intervention. Any acute changes in clinical condition may necessitate his procedure being postponed and/or cancelled. Patient will meet with anesthesia team (MD and/or CRNA) on the  day of his procedure for preoperative evaluation/assessment. Questions regarding anesthetic course will be fielded at that time.   Pre-surgical instructions were reviewed with the patient during his PAT appointment, and questions were fielded to satisfaction by PAT clinical staff. He has been instructed on which medications that he will need to hold prior to surgery, as well as the ones that have been deemed safe/appropriate to take on the day of his procedure. As part of the general education provided by PAT, patient made aware both verbally and in writing, that he would need to abstain from the use of any illegal substances during his perioperative course. He was advised that failure to follow the provided instructions could necessitate case cancellation or result in serious perioperative complications up to and including death. Patient encouraged to contact PAT and/or his surgeon's office  to discuss any questions or concerns that may arise prior to surgery; verbalized understanding.   Dorise Pereyra, MSN, APRN, FNP-C, CEN [facility]  Perioperative Services Nurse Practitioner Phone: [phone] Fax: [phone] 07/25/24 9:50 AM  NOTE: This note has been prepared using Dragon dictation software. Despite my best ability to proofread, there is always the potential that unintentional transcriptional errors may still occur from this process.

## 2024-07-26 ENCOUNTER — Other Ambulatory Visit: Payer: Self-pay

## 2024-07-26 ENCOUNTER — Encounter
Admission: RE | Disposition: A | Discharge status: Home / Self Care | Payer: Self-pay | Source: Home / Self Care | Attending: Vascular Surgery

## 2024-07-26 ENCOUNTER — Ambulatory Visit: Payer: Self-pay | Admitting: Urgent Care

## 2024-07-26 ENCOUNTER — Ambulatory Visit
Admission: RE | Admit: 2024-07-26 | Discharge: 2024-07-26 | Disposition: A | Discharge status: Home / Self Care | Payer: Self-pay | Attending: Vascular Surgery | Admitting: Vascular Surgery

## 2024-07-26 ENCOUNTER — Encounter: Payer: Self-pay | Admitting: Vascular Surgery

## 2024-07-26 DIAGNOSIS — I132 Hypertensive heart and chronic kidney disease with heart failure and with stage 5 chronic kidney disease, or end stage renal disease: Secondary | ICD-10-CM | POA: Insufficient documentation

## 2024-07-26 DIAGNOSIS — Z794 Long term (current) use of insulin: Secondary | ICD-10-CM | POA: Insufficient documentation

## 2024-07-26 DIAGNOSIS — I428 Other cardiomyopathies: Secondary | ICD-10-CM | POA: Insufficient documentation

## 2024-07-26 DIAGNOSIS — I252 Old myocardial infarction: Secondary | ICD-10-CM | POA: Insufficient documentation

## 2024-07-26 DIAGNOSIS — N186 End stage renal disease: Secondary | ICD-10-CM | POA: Insufficient documentation

## 2024-07-26 DIAGNOSIS — E6689 Other obesity not elsewhere classified: Secondary | ICD-10-CM | POA: Insufficient documentation

## 2024-07-26 DIAGNOSIS — E119 Type 2 diabetes mellitus without complications: Secondary | ICD-10-CM | POA: Insufficient documentation

## 2024-07-26 DIAGNOSIS — J45909 Unspecified asthma, uncomplicated: Secondary | ICD-10-CM | POA: Insufficient documentation

## 2024-07-26 DIAGNOSIS — E785 Hyperlipidemia, unspecified: Secondary | ICD-10-CM | POA: Insufficient documentation

## 2024-07-26 DIAGNOSIS — I5032 Chronic diastolic (congestive) heart failure: Secondary | ICD-10-CM | POA: Insufficient documentation

## 2024-07-26 DIAGNOSIS — I2510 Atherosclerotic heart disease of native coronary artery without angina pectoris: Secondary | ICD-10-CM | POA: Insufficient documentation

## 2024-07-26 DIAGNOSIS — N184 Chronic kidney disease, stage 4 (severe): Secondary | ICD-10-CM

## 2024-07-26 DIAGNOSIS — Z6841 Body Mass Index (BMI) 40.0 and over, adult: Secondary | ICD-10-CM | POA: Insufficient documentation

## 2024-07-26 HISTORY — DX: Essential (primary) hypertension: I10

## 2024-07-26 HISTORY — DX: Long term (current) use of aspirin: Z79.82

## 2024-07-26 HISTORY — PX: AV FISTULA PLACEMENT: SHX1204

## 2024-07-26 HISTORY — DX: Hyperlipidemia, unspecified: E78.5

## 2024-07-26 HISTORY — DX: Type 2 diabetes mellitus without complications: E11.9

## 2024-07-26 LAB — GLUCOSE, CAPILLARY
Glucose-Capillary: 248 mg/dL — ABNORMAL HIGH (ref 70–99)
Glucose-Capillary: 322 mg/dL — ABNORMAL HIGH (ref 70–99)
Glucose-Capillary: 331 mg/dL — ABNORMAL HIGH (ref 70–99)
Glucose-Capillary: 359 mg/dL — ABNORMAL HIGH (ref 70–99)

## 2024-07-26 SURGERY — ARTERIOVENOUS (AV) FISTULA CREATION
Anesthesia: General | Laterality: Right

## 2024-07-26 MED ORDER — PROPOFOL 10 MG/ML IV BOLUS
INTRAVENOUS | Status: DC | PRN
  Administered 2024-07-26: 300 mg via INTRAVENOUS

## 2024-07-26 MED ORDER — HYDROCODONE-ACETAMINOPHEN 5-325 MG PO TABS
1.0000 | ORAL_TABLET | Freq: Four times a day (QID) | ORAL | 0 refills | Status: AC | PRN
  Filled 2024-07-26: qty 30, 4d supply, fill #0

## 2024-07-26 MED ORDER — INSULIN ASPART 100 UNIT/ML IJ SOLN
10.0000 [IU] | Freq: Once | INTRAMUSCULAR | Status: AC
  Administered 2024-07-26: 10 [IU] via SUBCUTANEOUS

## 2024-07-26 MED ORDER — ONDANSETRON HCL 4 MG/2ML IJ SOLN
INTRAMUSCULAR | Status: AC
  Filled 2024-07-26: qty 2

## 2024-07-26 MED ORDER — GLYCOPYRROLATE 0.2 MG/ML IJ SOLN
INTRAMUSCULAR | Status: DC | PRN
Start: 2024-07-26 — End: 2024-07-26
  Administered 2024-07-26: .2 mg via INTRAVENOUS

## 2024-07-26 MED ORDER — CEFAZOLIN SODIUM-DEXTROSE 3-4 GM/150ML-% IV SOLN
3.0000 g | Freq: Once | INTRAVENOUS | Status: AC
  Administered 2024-07-26: 3 g via INTRAVENOUS
  Filled 2024-07-26: qty 150

## 2024-07-26 MED ORDER — MIDAZOLAM HCL 2 MG/2ML IJ SOLN
INTRAMUSCULAR | Status: DC | PRN
  Administered 2024-07-26: 2 mg via INTRAVENOUS

## 2024-07-26 MED ORDER — GLYCOPYRROLATE 0.2 MG/ML IJ SOLN
INTRAMUSCULAR | Status: AC
  Filled 2024-07-26: qty 1

## 2024-07-26 MED ORDER — ONDANSETRON HCL 4 MG/2ML IJ SOLN
INTRAMUSCULAR | Status: DC | PRN
  Administered 2024-07-26: 4 mg via INTRAVENOUS

## 2024-07-26 MED ORDER — SUCCINYLCHOLINE CHLORIDE 200 MG/10ML IV SOSY
PREFILLED_SYRINGE | INTRAVENOUS | Status: DC | PRN
  Administered 2024-07-26: 200 mg via INTRAVENOUS

## 2024-07-26 MED ORDER — ONDANSETRON HCL 4 MG/2ML IJ SOLN
4.0000 mg | Freq: Four times a day (QID) | INTRAMUSCULAR | Status: DC | PRN
  Administered 2024-07-26: 4 mg via INTRAVENOUS

## 2024-07-26 MED ORDER — MIDAZOLAM HCL 2 MG/2ML IJ SOLN
INTRAMUSCULAR | Status: AC
  Filled 2024-07-26: qty 2

## 2024-07-26 MED ORDER — LIDOCAINE HCL (CARDIAC) PF 100 MG/5ML IV SOSY
PREFILLED_SYRINGE | INTRAVENOUS | Status: DC | PRN
  Administered 2024-07-26: 100 mg via INTRAVENOUS

## 2024-07-26 MED ORDER — PAPAVERINE HCL 30 MG/ML IJ SOLN
INTRAMUSCULAR | Status: DC | PRN
  Administered 2024-07-26: 30 mg via INTRAVENOUS

## 2024-07-26 MED ORDER — CHLORHEXIDINE GLUCONATE 0.12 % MT SOLN
OROMUCOSAL | Status: AC
  Filled 2024-07-26: qty 15

## 2024-07-26 MED ORDER — HEMOSTATIC AGENTS (NO CHARGE) OPTIME
TOPICAL | Status: DC | PRN
  Administered 2024-07-26: 1

## 2024-07-26 MED ORDER — KETAMINE HCL 50 MG/5ML IJ SOSY
PREFILLED_SYRINGE | INTRAMUSCULAR | Status: AC
  Filled 2024-07-26: qty 5

## 2024-07-26 MED ORDER — FENTANYL CITRATE (PF) 100 MCG/2ML IJ SOLN
INTRAMUSCULAR | Status: AC
  Filled 2024-07-26: qty 2

## 2024-07-26 MED ORDER — DEXAMETHASONE SODIUM PHOSPHATE 10 MG/ML IJ SOLN
INTRAMUSCULAR | Status: DC | PRN
  Administered 2024-07-26: 10 mg via INTRAVENOUS

## 2024-07-26 MED ORDER — BUPIVACAINE LIPOSOME 1.3 % IJ SUSP
INTRAMUSCULAR | Status: AC
  Filled 2024-07-26: qty 20

## 2024-07-26 MED ORDER — KETAMINE HCL 50 MG/5ML IJ SOSY
PREFILLED_SYRINGE | INTRAMUSCULAR | Status: DC | PRN
  Administered 2024-07-26: 20 mg via INTRAVENOUS
  Administered 2024-07-26: 30 mg via INTRAVENOUS

## 2024-07-26 MED ORDER — PROPOFOL 1000 MG/100ML IV EMUL
INTRAVENOUS | Status: AC
  Filled 2024-07-26: qty 100

## 2024-07-26 MED ORDER — HEPARIN SODIUM (PORCINE) 5000 UNIT/ML IJ SOLN
INTRAMUSCULAR | Status: AC
  Filled 2024-07-26: qty 1

## 2024-07-26 MED ORDER — SODIUM CHLORIDE 0.9 % IR SOLN
Status: DC | PRN
  Administered 2024-07-26: 500 mL

## 2024-07-26 MED ORDER — VASOPRESSIN 20 UNIT/ML IV SOLN
INTRAVENOUS | Status: DC | PRN
  Administered 2024-07-26: 1 [IU] via INTRAVENOUS
  Administered 2024-07-26: 2 [IU] via INTRAVENOUS
  Administered 2024-07-26: 1 [IU] via INTRAVENOUS

## 2024-07-26 MED ORDER — FENTANYL CITRATE (PF) 100 MCG/2ML IJ SOLN: 25.0000 ug | INTRAMUSCULAR | Status: DC | PRN

## 2024-07-26 MED ORDER — HYDROMORPHONE HCL 1 MG/ML IJ SOLN: 1.0000 mg | Freq: Once | INTRAMUSCULAR | Status: DC | PRN

## 2024-07-26 MED ORDER — LIDOCAINE HCL (PF) 2 % IJ SOLN
INTRAMUSCULAR | Status: AC
  Filled 2024-07-26: qty 5

## 2024-07-26 MED ORDER — PHENYLEPHRINE 80 MCG/ML (10ML) SYRINGE FOR IV PUSH (FOR BLOOD PRESSURE SUPPORT)
PREFILLED_SYRINGE | INTRAVENOUS | Status: DC | PRN
  Administered 2024-07-26 (×2): 160 ug via INTRAVENOUS
  Administered 2024-07-26: 80 ug via INTRAVENOUS

## 2024-07-26 MED ORDER — VASHE WOUND IRRIGATION OPTIME
TOPICAL | Status: DC | PRN
Start: 2024-07-26 — End: 2024-07-26
  Administered 2024-07-26: 4 [oz_av]

## 2024-07-26 MED ORDER — INSULIN ASPART 100 UNIT/ML IJ SOLN
INTRAMUSCULAR | Status: AC
  Filled 2024-07-26: qty 1

## 2024-07-26 MED ORDER — FENTANYL CITRATE (PF) 100 MCG/2ML IJ SOLN
INTRAMUSCULAR | Status: AC
Start: 2024-07-26 — End: 2024-07-26
  Filled 2024-07-26: qty 2

## 2024-07-26 MED ORDER — BUPIVACAINE HCL (PF) 0.5 % IJ SOLN
INTRAMUSCULAR | Status: AC
  Filled 2024-07-26: qty 30

## 2024-07-26 MED ORDER — PAPAVERINE HCL 30 MG/ML IJ SOLN
INTRAMUSCULAR | Status: AC
  Filled 2024-07-26: qty 2

## 2024-07-26 MED ORDER — FENTANYL CITRATE (PF) 100 MCG/2ML IJ SOLN
INTRAMUSCULAR | Status: DC | PRN
  Administered 2024-07-26: 100 ug via INTRAVENOUS
  Administered 2024-07-26 (×2): 50 ug via INTRAVENOUS

## 2024-07-26 MED ORDER — DROPERIDOL 2.5 MG/ML IJ SOLN: 0.6250 mg | Freq: Once | INTRAMUSCULAR | Status: DC | PRN

## 2024-07-26 MED ORDER — PROPOFOL 10 MG/ML IV BOLUS
INTRAVENOUS | Status: AC
  Filled 2024-07-26: qty 40

## 2024-07-26 MED ORDER — VASOPRESSIN 20 UNIT/ML IV SOLN
INTRAVENOUS | Status: AC
  Filled 2024-07-26: qty 1

## 2024-07-26 MED ORDER — BUPIVACAINE HCL (PF) 0.5 % IJ SOLN
INTRAMUSCULAR | Status: DC | PRN
  Administered 2024-07-26: 22 mL

## 2024-07-26 SURGICAL SUPPLY — 43 items
BAG DECANTER FOR FLEXI CONT (MISCELLANEOUS) ×1 IMPLANT
BLADE SURG SZ11 CARB STEEL (BLADE) ×1 IMPLANT
BRUSH SCRUB EZ 4% CHG (MISCELLANEOUS) ×1 IMPLANT
CHLORAPREP W/TINT 26 (MISCELLANEOUS) ×1 IMPLANT
CLAMP SUTURE YELLOW 5 PAIRS (MISCELLANEOUS) ×1 IMPLANT
CLEANSER WND VASHE 4 (WOUND CARE) IMPLANT
CLIP APPLIE 11 MED OPEN (CLIP) IMPLANT
CLIP APPLIE 9.375 SM OPEN (CLIP) IMPLANT
COVER PROBE FLX POLY STRL (MISCELLANEOUS) IMPLANT
DERMABOND ADVANCED .7 DNX12 (GAUZE/BANDAGES/DRESSINGS) ×1 IMPLANT
DRESSING SURGICEL FIBRLLR 1X2 (HEMOSTASIS) ×1 IMPLANT
ELECT CAUTERY BLADE 6.4 (BLADE) ×1 IMPLANT
ELECTRODE REM PT RTRN 9FT ADLT (ELECTROSURGICAL) ×1 IMPLANT
GEL ULTRASOUND 20GR AQUASONIC (MISCELLANEOUS) IMPLANT
GLOVE BIO SURGEON STRL SZ7 (GLOVE) ×1 IMPLANT
GLOVE SURG SYN 8.0 PF PI (GLOVE) ×1 IMPLANT
GOWN STRL REUS W/ TWL LRG LVL3 (GOWN DISPOSABLE) ×2 IMPLANT
GOWN STRL REUS W/ TWL XL LVL3 (GOWN DISPOSABLE) ×1 IMPLANT
IV NS 500ML BAXH (IV SOLUTION) ×1 IMPLANT
KIT TURNOVER KIT A (KITS) ×1 IMPLANT
LABEL OR SOLS (LABEL) ×1 IMPLANT
LOOP VESSEL MAXI 1X406 RED (MISCELLANEOUS) ×1 IMPLANT
LOOP VESSEL MINI 0.8X406 BLUE (MISCELLANEOUS) ×2 IMPLANT
MANIFOLD NEPTUNE II (INSTRUMENTS) ×1 IMPLANT
NDL FILTER BLUNT 18X1 1/2 (NEEDLE) ×1 IMPLANT
NDL HYPO 30X.5 LL (NEEDLE) IMPLANT
NEEDLE FILTER BLUNT 18X1 1/2 (NEEDLE) ×2 IMPLANT
NEEDLE HYPO 30X.5 LL (NEEDLE) IMPLANT
PACK EXTREMITY ARMC (MISCELLANEOUS) ×1 IMPLANT
PAD PREP OB/GYN DISP 24X41 (PERSONAL CARE ITEMS) ×1 IMPLANT
PENCIL SMOKE EVACUATOR (MISCELLANEOUS) ×1 IMPLANT
STOCKINETTE 48X4 2 PLY STRL (GAUZE/BANDAGES/DRESSINGS) ×1 IMPLANT
STOCKINETTE STRL 4IN 9604848 (GAUZE/BANDAGES/DRESSINGS) ×1 IMPLANT
SUT MNCRL+ 5-0 UNDYED PC-3 (SUTURE) ×1 IMPLANT
SUT PROLENE 6 0 BV (SUTURE) ×4 IMPLANT
SUT SILK 2-0 18XBRD TIE 12 (SUTURE) ×1 IMPLANT
SUT SILK 3-0 18XBRD TIE 12 (SUTURE) ×1 IMPLANT
SUT SILK 4-0 18XBRD TIE 12 (SUTURE) ×1 IMPLANT
SUT VIC AB 3-0 SH 27X BRD (SUTURE) ×1 IMPLANT
SYR 20ML LL LF (SYRINGE) ×1 IMPLANT
SYR 3ML LL SCALE MARK (SYRINGE) ×1 IMPLANT
TRAP FLUID SMOKE EVACUATOR (MISCELLANEOUS) ×1 IMPLANT
WATER STERILE IRR 500ML POUR (IV SOLUTION) ×1 IMPLANT

## 2024-07-26 NOTE — H&P (View-Only) (Signed)
 MRN : [id number]  Erik [name] is a 33 y.o. (01-13-1991) male who presents with chief complaint of check circulation.  History of Present Illness:   Patient presents to [facility] today for creation of an AV access.  He was last seen in the office May 20, 2024.  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  He is not yet on dialysis.  The patient's most recent creatinine clearance is less than 20. The patient volume status has not yet become an issue. Patient's blood pressures been relatively well controlled. There are mild uremic symptoms which appear to be relatively well tolerated at this time.   The patient notes the kidney problem has been present for a long time and has been progressively getting worse.   The patient is followed by nephrology.     The patient is right-handed.   The patient has been considering the various methods of dialysis and wishes to proceed with hemodialysis and therefore creation of AV access is indicated.   The patient denies amaurosis fugax or recent TIA symptoms. There are no recent neurological changes noted. There is no history of DVT, PE or superficial thrombophlebitis. No recent episodes of angina or shortness of breath documented.   Current Meds  Medication Sig   albuterol  (VENTOLIN  HFA) 108 (90 Base) MCG/ACT inhaler INHALE 1-2 PUFFS INTO THE LUNGS EVERY SIX HOURS AS NEEDED FOR WHEEZING OR SHORTNESS OF BREATH.   atorvastatin  (LIPITOR ) 80 MG tablet Take 1 tablet (80 mg total) by mouth daily.   calcitRIOL  (ROCALTROL ) 0.25 MCG capsule Take 1 capsule (0.25 mcg total) by mouth daily.   carvedilol  (COREG ) 25 MG tablet Take 2 tablets (50 mg total) by mouth 2 (two) times daily.   cloNIDine  (CATAPRES ) 0.1 MG tablet Take 2 tablets (0.2 mg total) by mouth every morning AND 2 tablets (0.2 mg total) at bedtime.   ferrous sulfate  325  (65 FE) MG tablet Take 1 tablet (325 mg total) by mouth daily with breakfast.   hydrALAZINE  (APRESOLINE ) 100 MG tablet Take 1 tablet (100 mg total) by mouth 3 (three) times daily.   Insulin  Glargine (BASAGLAR  KWIKPEN) 100 UNIT/ML Inject 16 Units into the skin daily.   insulin  lispro (HUMALOG  KWIKPEN) 100 UNIT/ML KwikPen Inject 8 Units into the skin 3 (three) times daily.   isosorbide  mononitrate (IMDUR ) 120 MG 24 hr tablet Take 1 tablet (120 mg total) by mouth daily.   Multiple Vitamin (MULTIVITAMIN WITH MINERALS) TABS tablet Take 1 tablet by mouth daily.   omeprazole  (PRILOSEC) 40 MG capsule Take 1 capsule (40 mg total) by mouth daily.   potassium chloride  (KLOR-CON ) 10 MEQ tablet Take 1 tablet (10 mEq total) by mouth 2 (two) times daily.   sodium bicarbonate  650 MG tablet Take 2 tablets (1,300 mg total) by mouth in the morning AND 2 tablets (1,300 mg total) every evening.   torsemide  (DEMADEX ) 20 MG tablet Take 1 tablet (20 mg total) by mouth daily.    Past Medical History:  Diagnosis Date   ARDS survivor    a. 01/2021 in  setting of COVID PNA.   Asthma    CKD (chronic kidney disease), stage IV (HCC)    a.) AKI 01/2021 in setting of COVID; initiated on CRRT during extended admission   COVID-19 virus infection 01/2021   Heart failure with improved ejection fraction (HFimpEF) (HCC)    a. 01/2021 LV gram: EF 35-45%; b. 01/2021 Echo: EF 60-65%; c. 09/2021 Echo: EF 55% (51.8% 2D MOD biplane).   HLD (hyperlipidemia)    HTN (hypertension)    Incomplete RBBB    Insulin  dependent type 2 diabetes mellitus (HCC)    Long-term use of aspirin  therapy    Microcytic anemia    Myocarditis (HCC)    a. 01/2021 in setting of COVID PNA.   NICM (nonischemic cardiomyopathy) (HCC)    a. 01/2021 LV gram: EF 35-45%; b. 01/2021 Echo: EF 60-65%, no rwma, nl RV fxn; c. 09/2021 Echo: EF 55% (51.8% 2D MOD biplane). No rwma, nl RV fxn w/ mild enlargement.   Non-obstructive CAD (coronary artery disease)    a. 01/2021 Cath:  LM nl, LAD nl, RI nl, LCX nl, LPAV mild dzs, RCA nl, RPDA min irregs. EF 35-45%.   ST elevation myocardial infarction (STEMI) (HCC)    Super-super obese (HCC)    T2DM (type 2 diabetes mellitus) (HCC)    Ventricular tachycardia (HCC)    a. 01/2021 in setting of myocarditis/COVID.    Past Surgical History:  Procedure Laterality Date   LEFT HEART CATH AND CORONARY ANGIOGRAPHY N/A 01/20/2021   Procedure: LEFT HEART CATH AND CORONARY ANGIOGRAPHY;  Surgeon: Mady Bruckner, MD;  Location: [facility] INVASIVE CV LAB;  Service: Cardiovascular;  Laterality: N/A;    Social History Social History   Tobacco Use   Smoking status: Never   Smokeless tobacco: Never  Vaping Use   Vaping status: Never Used  Substance Use Topics   Alcohol use: Never   Drug use: Never    Family History Family History  Problem Relation Age of Onset   Hypertension Mother    Kidney disease Mother    Heart Problems Maternal Grandmother    Heart attack Maternal Grandmother    Heart disease Maternal Grandmother     No Known Allergies   REVIEW OF SYSTEMS (Negative unless checked)  Constitutional: [] Weight loss  [] Fever  [] Chills Cardiac: [] Chest pain   [] Chest pressure   [] Palpitations   [] Shortness of breath when laying flat   [] Shortness of breath with exertion. Vascular:  [x] Pain in legs with walking   [] Pain in legs at rest  [] History of DVT   [] Phlebitis   [] Swelling in legs   [] Varicose veins   [] Non-healing ulcers Pulmonary:   [] Uses home oxygen   [] Productive cough   [] Hemoptysis   [] Wheeze  [] COPD   [] Asthma Neurologic:  [] Dizziness   [] Seizures   [] History of stroke   [] History of TIA  [] Aphasia   [] Vissual changes   [] Weakness or numbness in arm   [] Weakness or numbness in leg Musculoskeletal:   [] Joint swelling   [] Joint pain   [] Low back pain Hematologic:  [] Easy bruising  [] Easy bleeding   [] Hypercoagulable state   [] Anemic Gastrointestinal:  [] Diarrhea   [] Vomiting  [] Gastroesophageal reflux/heartburn    [] Difficulty swallowing. Genitourinary:  [] Chronic kidney disease   [] Difficult urination  [] Frequent urination   [] Blood in urine Skin:  [] Rashes   [] Ulcers  Psychological:  [] History of anxiety   []  History of major depression.  Physical Examination  Vitals:   07/26/24 0625 07/26/24 0703  BP: ROLLEN)  171/100 (!) 153/91  Pulse: 93   Resp: 16   Temp: (!) 97.1 F (36.2 C)   TempSrc: Temporal   SpO2: 100%    There is no height or weight on file to calculate BMI. Gen: WD/WN, NAD Head: [place]/AT, No temporalis wasting.  Ear/Nose/Throat: Hearing grossly intact, nares w/o erythema or drainage Eyes: PER, EOMI, sclera nonicteric.  Neck: Supple, no masses.  No bruit or JVD.  Pulmonary:  Good air movement, no audible wheezing, no use of accessory muscles.  Cardiac: RRR, normal S1, S2, no Murmurs. Vascular:  Vessel Right Left  Radial Palpable Palpable  Gastrointestinal: soft, non-distended. No guarding/no peritoneal signs.  Musculoskeletal: M/S 5/5 throughout.  No visible deformity.  Neurologic: CN 2-12 intact. Pain and light touch intact in extremities.  Symmetrical.  Speech is fluent. Motor exam as listed above. Psychiatric: Judgment intact, Mood & affect appropriate for pt's clinical situation. Dermatologic: No rashes or ulcers noted.  No changes consistent with cellulitis.   CBC Lab Results  Component Value Date   WBC 9.4 03/29/2023   HGB 11.0 (L) 03/29/2023   HCT 36.7 (L) 03/29/2023   MCV 83.2 03/29/2023   PLT 290 03/29/2023    BMET    Component Value Date/Time   NA 147 (H) 11/29/2023 0901   K 4.4 11/29/2023 0901   CL 115 (H) 11/29/2023 0901   CO2 16 (L) 11/29/2023 0901   GLUCOSE 105 (H) 11/29/2023 0901   GLUCOSE 101 (H) 03/29/2023 1039   BUN 46 (H) 11/29/2023 0901   CREATININE 3.00 (H) 11/29/2023 0901   CALCIUM  8.4 (L) 11/29/2023 0901   GFRNONAA 31 (L) 03/29/2023 1039   CrCl cannot be calculated (Patient's most recent lab result is older than the maximum 21 days  allowed.).  COAG Lab Results  Component Value Date   INR 1.2 03/08/2021   INR 1.2 01/12/2021   INR SPECIMEN CLOTTED 01/12/2021    Radiology No results found.   Assessment/Plan 1. Chronic kidney disease (CKD), stage IV (severe) (HCC) (Primary) Recommend:   At this time the patient does not have appropriate extremity access for dialysis   Patient should have a right radial cephalic fistula created.   The risks, benefits and alternative therapies were reviewed in detail with the patient.  All questions were answered.  The patient agrees to proceed with surgery.    The patient will follow up with me in the office after the surgery.   2. ST elevation myocardial infarction involving right coronary artery (HCC) Continue cardiac and antihypertensive medications as already ordered and reviewed, no changes at this time.   Continue statin as ordered and reviewed, no changes at this time   Nitrates PRN for chest pain   3. Essential hypertension Continue antihypertensive medications as already ordered, these medications have been reviewed and there are no changes at this time.   4. 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   Hgb A1C to be monitored as already arranged by primary service   5. Dyslipidemia Continue statin as ordered and reviewed, no changes at this time   Cordella Shawl, MD  07/26/2024 7:07 AM

## 2024-07-26 NOTE — Op Note (Signed)
 OPERATIVE NOTE   PROCEDURE: right radiocephalic arteriovenous fistula placement  PRE-OPERATIVE DIAGNOSIS: End Stage Renal Disease  POST-OPERATIVE DIAGNOSIS: End Stage Renal Disease  SURGEON: Cordella [name]  ASSISTANT(S): None  ANESTHESIA: general  ESTIMATED BLOOD LOSS: <50 cc  FINDING(S): 4.5 mm cephalic vein  SPECIMEN(S):  none  INDICATIONS:   Erik [name] is a 33 y.o. male who presents with end stage renal disease.  The patient is scheduled for right brachiocephalic arteriovenous fistula placement.  The patient is aware the risks include but are not limited to: bleeding, infection, steal syndrome, nerve damage, ischemic monomelic neuropathy, failure to mature, and need for additional procedures.  The patient is aware of the risks of the procedure and elects to proceed forward.  DESCRIPTION: After full informed written consent was obtained from the patient, the patient was brought back to the operating room and placed supine upon the operating table.  Prior to induction, the patient received IV antibiotics.   After obtaining adequate anesthesia, the patient was then prepped and draped in the standard fashion for a right arm access procedure.   A linear incision was then created midway between the radial impulse and the cephalic vein. The cephalic vein was then identified and dissected circumferentially.  However at this level the vein was not adequate for fistula creation.  The ultrasound was then prepped and draped in brought into the surgical field.  Beginning in the mid forearm the cephalic vein was easily identified and does appear to be of adequate size for fistula creation.  It was then followed down toward the wrist to the point where there was a bifurcation.  Distal to this point the vein became marginal which is what was identified by the initial incision.  The area of bifurcation was then marked and a second linear incision was made overlying the vein itself and then  extended proximally for a distance of approximately 4 cm.  The vein was dissected circumferentially and then marked with a surgical marker.  Through this second incision the radial artery was identified using a Doppler.  Attention was then turned to the radial artery which was exposed through the same incision and looped proximally and distally. Side branches were controlled with 4-0 silk ties.  The radial artery appeared to be very small and papaverine  was delivered onto the field and 1 cc of papaverine  was infiltrated into the adventitia and surrounding the radial artery  The distal segment of the vein was ligated with a  2-0 silk, and the vein was transected.  The proximal segment was interrogated with serial dilators.  The vein accepted up to a 4.5 mm dilator without any difficulty. Heparinized saline was infused into the vein and clamped it with a small bulldog.  At this point, I reset my exposure of the brachial artery and controlled the artery with vessel loops proximally and distally.  An arteriotomy was then made with a #11 blade, and extended with a Potts scissor.  The radial artery was then interrogated with [address] coronary dilators beginning with a 2 mm then a 2.5 mm and finally a 3 mm dilator.  Using a Marks tip heparinized saline was injected proximal and distal into the radial artery.  The vein was then approximated to the artery while the artery was in its native bed and subsequently the vein was beveled using Potts scissors. The vein was then sewn to the artery in an end-to-side configuration with a running stitch of 6-0 Prolene.  Prior to  completing this anastomosis Flushing maneuvers were performed and the artery was allowed to forward and back bleed.  There was no evidence of clot from any vessels.  I completed the anastomosis in the usual fashion and then released all vessel loops and clamps.    There was good  thrill in the venous outflow, and there was 1+ palpable radial pulse.  At this  point, I irrigated out the surgical wound.  There was no further active bleeding.  The subcutaneous tissue was reapproximated with a running stitch of 3-0 Vicryl.  The skin was then reapproximated with a running subcuticular stitch of 4-0 Monocryl.  The skin was then cleaned, dried, and reinforced with Dermabond.    The patient tolerated this procedure well.   COMPLICATIONS: None  CONDITION: Erik [name] [facility]  Office: [phone]   07/26/2024, 10:47 AM

## 2024-07-26 NOTE — Progress Notes (Signed)
 MRN : [id number]  Erik [name] is a 33 y.o. (01-13-1991) male who presents with chief complaint of check circulation.  History of Present Illness:   Patient presents to [facility] today for creation of an AV access.  He was last seen in the office May 20, 2024.  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  He is not yet on dialysis.  The patient's most recent creatinine clearance is less than 20. The patient volume status has not yet become an issue. Patient's blood pressures been relatively well controlled. There are mild uremic symptoms which appear to be relatively well tolerated at this time.   The patient notes the kidney problem has been present for a long time and has been progressively getting worse.   The patient is followed by nephrology.     The patient is right-handed.   The patient has been considering the various methods of dialysis and wishes to proceed with hemodialysis and therefore creation of AV access is indicated.   The patient denies amaurosis fugax or recent TIA symptoms. There are no recent neurological changes noted. There is no history of DVT, PE or superficial thrombophlebitis. No recent episodes of angina or shortness of breath documented.   Current Meds  Medication Sig   albuterol  (VENTOLIN  HFA) 108 (90 Base) MCG/ACT inhaler INHALE 1-2 PUFFS INTO THE LUNGS EVERY SIX HOURS AS NEEDED FOR WHEEZING OR SHORTNESS OF BREATH.   atorvastatin  (LIPITOR ) 80 MG tablet Take 1 tablet (80 mg total) by mouth daily.   calcitRIOL  (ROCALTROL ) 0.25 MCG capsule Take 1 capsule (0.25 mcg total) by mouth daily.   carvedilol  (COREG ) 25 MG tablet Take 2 tablets (50 mg total) by mouth 2 (two) times daily.   cloNIDine  (CATAPRES ) 0.1 MG tablet Take 2 tablets (0.2 mg total) by mouth every morning AND 2 tablets (0.2 mg total) at bedtime.   ferrous sulfate  325  (65 FE) MG tablet Take 1 tablet (325 mg total) by mouth daily with breakfast.   hydrALAZINE  (APRESOLINE ) 100 MG tablet Take 1 tablet (100 mg total) by mouth 3 (three) times daily.   Insulin  Glargine (BASAGLAR  KWIKPEN) 100 UNIT/ML Inject 16 Units into the skin daily.   insulin  lispro (HUMALOG  KWIKPEN) 100 UNIT/ML KwikPen Inject 8 Units into the skin 3 (three) times daily.   isosorbide  mononitrate (IMDUR ) 120 MG 24 hr tablet Take 1 tablet (120 mg total) by mouth daily.   Multiple Vitamin (MULTIVITAMIN WITH MINERALS) TABS tablet Take 1 tablet by mouth daily.   omeprazole  (PRILOSEC) 40 MG capsule Take 1 capsule (40 mg total) by mouth daily.   potassium chloride  (KLOR-CON ) 10 MEQ tablet Take 1 tablet (10 mEq total) by mouth 2 (two) times daily.   sodium bicarbonate  650 MG tablet Take 2 tablets (1,300 mg total) by mouth in the morning AND 2 tablets (1,300 mg total) every evening.   torsemide  (DEMADEX ) 20 MG tablet Take 1 tablet (20 mg total) by mouth daily.    Past Medical History:  Diagnosis Date   ARDS survivor    a. 01/2021 in  setting of COVID PNA.   Asthma    CKD (chronic kidney disease), stage IV (HCC)    a.) AKI 01/2021 in setting of COVID; initiated on CRRT during extended admission   COVID-19 virus infection 01/2021   Heart failure with improved ejection fraction (HFimpEF) (HCC)    a. 01/2021 LV gram: EF 35-45%; b. 01/2021 Echo: EF 60-65%; c. 09/2021 Echo: EF 55% (51.8% 2D MOD biplane).   HLD (hyperlipidemia)    HTN (hypertension)    Incomplete RBBB    Insulin  dependent type 2 diabetes mellitus (HCC)    Long-term use of aspirin  therapy    Microcytic anemia    Myocarditis (HCC)    a. 01/2021 in setting of COVID PNA.   NICM (nonischemic cardiomyopathy) (HCC)    a. 01/2021 LV gram: EF 35-45%; b. 01/2021 Echo: EF 60-65%, no rwma, nl RV fxn; c. 09/2021 Echo: EF 55% (51.8% 2D MOD biplane). No rwma, nl RV fxn w/ mild enlargement.   Non-obstructive CAD (coronary artery disease)    a. 01/2021 Cath:  LM nl, LAD nl, RI nl, LCX nl, LPAV mild dzs, RCA nl, RPDA min irregs. EF 35-45%.   ST elevation myocardial infarction (STEMI) (HCC)    Super-super obese (HCC)    T2DM (type 2 diabetes mellitus) (HCC)    Ventricular tachycardia (HCC)    a. 01/2021 in setting of myocarditis/COVID.    Past Surgical History:  Procedure Laterality Date   LEFT HEART CATH AND CORONARY ANGIOGRAPHY N/A 01/20/2021   Procedure: LEFT HEART CATH AND CORONARY ANGIOGRAPHY;  Surgeon: Mady Bruckner, MD;  Location: [facility] INVASIVE CV LAB;  Service: Cardiovascular;  Laterality: N/A;    Social History Social History   Tobacco Use   Smoking status: Never   Smokeless tobacco: Never  Vaping Use   Vaping status: Never Used  Substance Use Topics   Alcohol use: Never   Drug use: Never    Family History Family History  Problem Relation Age of Onset   Hypertension Mother    Kidney disease Mother    Heart Problems Maternal Grandmother    Heart attack Maternal Grandmother    Heart disease Maternal Grandmother     No Known Allergies   REVIEW OF SYSTEMS (Negative unless checked)  Constitutional: [] Weight loss  [] Fever  [] Chills Cardiac: [] Chest pain   [] Chest pressure   [] Palpitations   [] Shortness of breath when laying flat   [] Shortness of breath with exertion. Vascular:  [x] Pain in legs with walking   [] Pain in legs at rest  [] History of DVT   [] Phlebitis   [] Swelling in legs   [] Varicose veins   [] Non-healing ulcers Pulmonary:   [] Uses home oxygen   [] Productive cough   [] Hemoptysis   [] Wheeze  [] COPD   [] Asthma Neurologic:  [] Dizziness   [] Seizures   [] History of stroke   [] History of TIA  [] Aphasia   [] Vissual changes   [] Weakness or numbness in arm   [] Weakness or numbness in leg Musculoskeletal:   [] Joint swelling   [] Joint pain   [] Low back pain Hematologic:  [] Easy bruising  [] Easy bleeding   [] Hypercoagulable state   [] Anemic Gastrointestinal:  [] Diarrhea   [] Vomiting  [] Gastroesophageal reflux/heartburn    [] Difficulty swallowing. Genitourinary:  [] Chronic kidney disease   [] Difficult urination  [] Frequent urination   [] Blood in urine Skin:  [] Rashes   [] Ulcers  Psychological:  [] History of anxiety   []  History of major depression.  Physical Examination  Vitals:   07/26/24 0625 07/26/24 0703  BP: ROLLEN)  171/100 (!) 153/91  Pulse: 93   Resp: 16   Temp: (!) 97.1 F (36.2 C)   TempSrc: Temporal   SpO2: 100%    There is no height or weight on file to calculate BMI. Gen: WD/WN, NAD Head: [place]/AT, No temporalis wasting.  Ear/Nose/Throat: Hearing grossly intact, nares w/o erythema or drainage Eyes: PER, EOMI, sclera nonicteric.  Neck: Supple, no masses.  No bruit or JVD.  Pulmonary:  Good air movement, no audible wheezing, no use of accessory muscles.  Cardiac: RRR, normal S1, S2, no Murmurs. Vascular:  Vessel Right Left  Radial Palpable Palpable  Gastrointestinal: soft, non-distended. No guarding/no peritoneal signs.  Musculoskeletal: M/S 5/5 throughout.  No visible deformity.  Neurologic: CN 2-12 intact. Pain and light touch intact in extremities.  Symmetrical.  Speech is fluent. Motor exam as listed above. Psychiatric: Judgment intact, Mood & affect appropriate for pt's clinical situation. Dermatologic: No rashes or ulcers noted.  No changes consistent with cellulitis.   CBC Lab Results  Component Value Date   WBC 9.4 03/29/2023   HGB 11.0 (L) 03/29/2023   HCT 36.7 (L) 03/29/2023   MCV 83.2 03/29/2023   PLT 290 03/29/2023    BMET    Component Value Date/Time   NA 147 (H) 11/29/2023 0901   K 4.4 11/29/2023 0901   CL 115 (H) 11/29/2023 0901   CO2 16 (L) 11/29/2023 0901   GLUCOSE 105 (H) 11/29/2023 0901   GLUCOSE 101 (H) 03/29/2023 1039   BUN 46 (H) 11/29/2023 0901   CREATININE 3.00 (H) 11/29/2023 0901   CALCIUM  8.4 (L) 11/29/2023 0901   GFRNONAA 31 (L) 03/29/2023 1039   CrCl cannot be calculated (Patient's most recent lab result is older than the maximum 21 days  allowed.).  COAG Lab Results  Component Value Date   INR 1.2 03/08/2021   INR 1.2 01/12/2021   INR SPECIMEN CLOTTED 01/12/2021    Radiology No results found.   Assessment/Plan 1. Chronic kidney disease (CKD), stage IV (severe) (HCC) (Primary) Recommend:   At this time the patient does not have appropriate extremity access for dialysis   Patient should have a right radial cephalic fistula created.   The risks, benefits and alternative therapies were reviewed in detail with the patient.  All questions were answered.  The patient agrees to proceed with surgery.    The patient will follow up with me in the office after the surgery.   2. ST elevation myocardial infarction involving right coronary artery (HCC) Continue cardiac and antihypertensive medications as already ordered and reviewed, no changes at this time.   Continue statin as ordered and reviewed, no changes at this time   Nitrates PRN for chest pain   3. Essential hypertension Continue antihypertensive medications as already ordered, these medications have been reviewed and there are no changes at this time.   4. 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   Hgb A1C to be monitored as already arranged by primary service   5. Dyslipidemia Continue statin as ordered and reviewed, no changes at this time   Cordella Shawl, MD  07/26/2024 7:07 AM

## 2024-07-26 NOTE — Transfer of Care (Signed)
 Immediate Anesthesia Transfer of Care Note  Patient: Erik [name]  Procedure(s) Performed: ARTERIOVENOUS (AV) FISTULA CREATION (Right)  Patient Location: PACU  Anesthesia Type:General  Level of Consciousness: awake and patient cooperative  Airway & Oxygen Therapy: Patient Spontanous Breathing and Patient connected to face mask oxygen  Post-op Assessment: Report given to RN and Post -op Vital signs reviewed and stable  Post vital signs: Reviewed and stable  Last Vitals:  Vitals Value Taken Time  BP 143/74 07/26/24 10:38  Temp    Pulse 91 07/26/24 10:41  Resp 21 07/26/24 10:41  SpO2 94 % 07/26/24 10:41  Vitals shown include unfiled device data.  Last Pain:  Vitals:   07/26/24 0625  TempSrc: Temporal  PainSc: 0-No pain         Complications: No notable events documented.

## 2024-07-26 NOTE — Interval H&P Note (Signed)
 History and Physical Interval Note:  07/26/2024 7:11 AM  Erik [name]  has presented today for surgery, with the diagnosis of CHRONIC KIDNEY DISEASE STAGE IV.  The various methods of treatment have been discussed with the patient and family. After consideration of risks, benefits and other options for treatment, the patient has consented to  Procedure(s) with comments: ARTERIOVENOUS (AV) FISTULA CREATION (Right) - RADIALCEPHALIC as a surgical intervention.  The patient's history has been reviewed, patient examined, no change in status, stable for surgery.  I have reviewed the patient's chart and labs.  Questions were answered to the patient's satisfaction.     Cordella Shawl

## 2024-07-26 NOTE — Anesthesia Procedure Notes (Signed)
 Procedure Name: Intubation Date/Time: 07/26/2024 7:53 AM  Performed by: Brien Sotero PARAS, CRNAPre-anesthesia Checklist: Patient identified, Patient being monitored, Timeout performed, Emergency Drugs available and Suction available Patient Re-evaluated:Patient Re-evaluated prior to induction Oxygen Delivery Method: Circle system utilized Preoxygenation: Pre-oxygenation with 100% oxygen Induction Type: IV induction Ventilation: Mask ventilation without difficulty Laryngoscope Size: McGrath and 4 Grade View: Grade I Tube type: Oral Tube size: 8.0 mm Number of attempts: 1 Airway Equipment and Method: Stylet Placement Confirmation: ETT inserted through vocal cords under direct vision, positive ETCO2 and breath sounds checked- equal and bilateral Secured at: 22 cm Tube secured with: Tape Dental Injury: Teeth and Oropharynx as per pre-operative assessment

## 2024-07-26 NOTE — Anesthesia Preprocedure Evaluation (Addendum)
 Anesthesia Evaluation  Patient identified by MRN, date of birth, ID band Patient awake    Reviewed: Allergy & Precautions, H&P , NPO status , Patient's Chart, lab work & pertinent test results, reviewed documented beta blocker date and time   History of Anesthesia Complications Negative for: history of anesthetic complications  Airway Mallampati: II  TM Distance: >3 FB Neck ROM: full    Dental  (+) Dental Advidsory Given, Teeth Intact   Pulmonary neg shortness of breath, asthma , neg COPD, Recent URI , Resolved   Pulmonary exam normal breath sounds clear to auscultation       Cardiovascular Exercise Tolerance: Good hypertension, (-) angina +CHF  (-) CAD, (-) Past MI and (-) Cardiac Stents Normal cardiovascular exam+ dysrhythmias (incomplete RBBB) (-) Valvular Problems/Murmurs Rhythm:regular Rate:Normal  ECHO 09/22/21: 1. Left ventricular ejection fraction, by estimation, is 55%. Left ventricular ejection fraction by 2D MOD biplane is 51.8 %. The left ventricle has low normal function. The left ventricle has no regional wall motion abnormalities. There is mild left ventricular hypertrophy. Left ventricular diastolic parameters are indeterminate.   2. Right ventricular systolic function is normal. The right ventricular size is mildly enlarged.   3. The mitral valve is normal in structure. No evidence of mitral valve regurgitation.   4. The aortic valve was not well visualized. Aortic valve regurgitation is not visualized.   5. The inferior vena cava is dilated in size with >50% respiratory variability, suggesting right atrial pressure of 8 mmHg.     Neuro/Psych negative neurological ROS  negative psych ROS   GI/Hepatic Neg liver ROS,GERD  ,,  Endo/Other  diabetes  Class 4 obesity  Renal/GU CRFRenal disease  negative genitourinary   Musculoskeletal   Abdominal   Peds  Hematology negative hematology ROS (+)   Anesthesia  Other Findings Past Medical History: No date: ARDS survivor     Comment:  a. 01/2021 in setting of COVID PNA. No date: Asthma No date: CKD (chronic kidney disease), stage IV (HCC)     Comment:  a.) AKI 01/2021 in setting of COVID; initiated on CRRT               during extended admission 01/2021: COVID-19 virus infection No date: Heart failure with improved ejection fraction (HFimpEF) (HCC)     Comment:  a. 01/2021 LV gram: EF 35-45%; b. 01/2021 Echo: EF 60-65%;              c. 09/2021 Echo: EF 55% (51.8% 2D MOD biplane). No date: HLD (hyperlipidemia) No date: HTN (hypertension) No date: Incomplete RBBB No date: Insulin  dependent type 2 diabetes mellitus (HCC) No date: Long-term use of aspirin  therapy No date: Microcytic anemia No date: Myocarditis (HCC)     Comment:  a. 01/2021 in setting of COVID PNA. No date: NICM (nonischemic cardiomyopathy) (HCC)     Comment:  a. 01/2021 LV gram: EF 35-45%; b. 01/2021 Echo: EF 60-65%,              no rwma, nl RV fxn; c. 09/2021 Echo: EF 55% (51.8% 2D MOD              biplane). No rwma, nl RV fxn w/ mild enlargement. No date: Non-obstructive CAD (coronary artery disease)     Comment:  a. 01/2021 Cath: LM nl, LAD nl, RI nl, LCX nl, LPAV mild               dzs, RCA nl, RPDA min irregs. EF 35-45%. No  date: ST elevation myocardial infarction (STEMI) (HCC) No date: Super-super obese (HCC) No date: T2DM (type 2 diabetes mellitus) (HCC) No date: Ventricular tachycardia (HCC)     Comment:  a. 01/2021 in setting of myocarditis/COVID.   Reproductive/Obstetrics negative OB ROS                              Anesthesia Physical Anesthesia Plan  ASA: 3  Anesthesia Plan: General   Post-op Pain Management:    Induction: Intravenous  PONV Risk Score and Plan: 2 and Ondansetron , Dexamethasone , Midazolam  and Treatment may vary due to age or medical condition  Airway Management Planned:   Additional Equipment:   Intra-op Plan:    Post-operative Plan: Extubation in OR  Informed Consent: I have reviewed the patients History and Physical, chart, labs and discussed the procedure including the risks, benefits and alternatives for the proposed anesthesia with the patient or authorized representative who has indicated his/her understanding and acceptance.     Dental Advisory Given  Plan Discussed with: Anesthesiologist, CRNA and Surgeon  Anesthesia Plan Comments: (Patient consented for PNB and would like to see how much pain he will be in post surgery.)         Anesthesia Quick Evaluation

## 2024-07-29 ENCOUNTER — Encounter: Payer: Self-pay | Admitting: Vascular Surgery

## 2024-07-29 NOTE — Anesthesia Postprocedure Evaluation (Signed)
 Anesthesia Post Note  Patient: MALONE VANBLARCOM  Procedure(s) Performed: ARTERIOVENOUS (AV) FISTULA CREATION (Right)  Patient location during evaluation: PACU Anesthesia Type: General Level of consciousness: awake and alert Pain management: pain level controlled Vital Signs Assessment: post-procedure vital signs reviewed and stable Respiratory status: spontaneous breathing, nonlabored ventilation, respiratory function stable and patient connected to nasal cannula oxygen Cardiovascular status: blood pressure returned to baseline and stable Postop Assessment: no apparent nausea or vomiting Anesthetic complications: no   No notable events documented.   Last Vitals:  Vitals:   07/26/24 1148 07/26/24 1203  BP:  (!) 176/100  Pulse: 87 90  Resp: 18 16  Temp:  (!) 36.2 C  SpO2: 95% 95%    Last Pain:  Vitals:   07/26/24 1203  TempSrc: Tympanic  PainSc: 0-No pain                 Prentice Murphy

## 2024-08-01 ENCOUNTER — Other Ambulatory Visit (INDEPENDENT_AMBULATORY_CARE_PROVIDER_SITE_OTHER): Payer: Self-pay | Admitting: Vascular Surgery

## 2024-08-01 DIAGNOSIS — Z9889 Other specified postprocedural states: Secondary | ICD-10-CM

## 2024-08-01 DIAGNOSIS — N186 End stage renal disease: Secondary | ICD-10-CM

## 2024-08-14 ENCOUNTER — Ambulatory Visit (INDEPENDENT_AMBULATORY_CARE_PROVIDER_SITE_OTHER): Payer: Self-pay

## 2024-08-14 DIAGNOSIS — N186 End stage renal disease: Secondary | ICD-10-CM

## 2024-08-14 DIAGNOSIS — Z9889 Other specified postprocedural states: Secondary | ICD-10-CM

## 2024-08-15 ENCOUNTER — Ambulatory Visit (INDEPENDENT_AMBULATORY_CARE_PROVIDER_SITE_OTHER): Payer: Self-pay | Admitting: Nurse Practitioner

## 2024-08-15 ENCOUNTER — Encounter (INDEPENDENT_AMBULATORY_CARE_PROVIDER_SITE_OTHER): Payer: Self-pay | Admitting: Nurse Practitioner

## 2024-08-15 VITALS — BP 175/100 | HR 89 | Ht 70.0 in | Wt >= 6400 oz

## 2024-08-15 DIAGNOSIS — N186 End stage renal disease: Secondary | ICD-10-CM

## 2024-08-18 ENCOUNTER — Encounter (INDEPENDENT_AMBULATORY_CARE_PROVIDER_SITE_OTHER): Payer: Self-pay | Admitting: Nurse Practitioner

## 2024-08-18 NOTE — Progress Notes (Signed)
 Subjective:    Patient ID: Erik [name], male    DOB: 1991-01-18, 33 y.o.   MRN: [id number] Chief Complaint  Patient presents with   Post-op Follow-up    The patient returns today for evaluation of his right radiocephalic fistula that was placed on 07/26/2024.  He underwent a duplex which showed a flow volume of 1328.  It has a good thrill at this time but there is still scabbing along his incision.  His fistula is also not very prominent at this time.    Review of Systems  Skin:  Positive for wound.  All other systems reviewed and are negative.      Objective:   Physical Exam Vitals reviewed.  HENT:     Head: Normocephalic.  Cardiovascular:     Rate and Rhythm: Normal rate.     Pulses: Normal pulses.  Pulmonary:     Effort: Pulmonary effort is normal.  Skin:    General: Skin is warm and dry.  Neurological:     Mental Status: He is alert and oriented to person, place, and time.  Psychiatric:        Mood and Affect: Mood normal.        Behavior: Behavior normal.        Thought Content: Thought content normal.        Judgment: Judgment normal.     BP (!) 175/100   Pulse 89   Ht 5' 10 (1.778 m)   Wt (!) 479 lb (217.3 kg)   BMI 68.73 kg/m   Past Medical History:  Diagnosis Date   ARDS survivor    a. 01/2021 in setting of COVID PNA.   Asthma    CKD (chronic kidney disease), stage IV (HCC)    a.) AKI 01/2021 in setting of COVID; initiated on CRRT during extended admission   COVID-19 virus infection 01/2021   Heart failure with improved ejection fraction (HFimpEF) (HCC)    a. 01/2021 LV gram: EF 35-45%; b. 01/2021 Echo: EF 60-65%; c. 09/2021 Echo: EF 55% (51.8% 2D MOD biplane).   HLD (hyperlipidemia)    HTN (hypertension)    Incomplete RBBB    Insulin  dependent type 2 diabetes mellitus (HCC)    Long-term use of aspirin  therapy    Microcytic anemia    Myocarditis (HCC)    a. 01/2021 in setting of COVID PNA.   NICM (nonischemic cardiomyopathy) (HCC)    a. 01/2021  LV gram: EF 35-45%; b. 01/2021 Echo: EF 60-65%, no rwma, nl RV fxn; c. 09/2021 Echo: EF 55% (51.8% 2D MOD biplane). No rwma, nl RV fxn w/ mild enlargement.   Non-obstructive CAD (coronary artery disease)    a. 01/2021 Cath: LM nl, LAD nl, RI nl, LCX nl, LPAV mild dzs, RCA nl, RPDA min irregs. EF 35-45%.   ST elevation myocardial infarction (STEMI) (HCC)    Super-super obese (HCC)    T2DM (type 2 diabetes mellitus) (HCC)    Ventricular tachycardia (HCC)    a. 01/2021 in setting of myocarditis/COVID.    Social History   Socioeconomic History   Marital status: Single    Spouse name: Not on file   Number of children: Not on file   Years of education: Not on file   Highest education level: Not on file  Occupational History   Not on file  Tobacco Use   Smoking status: Never   Smokeless tobacco: Never  Vaping Use   Vaping status: Never Used  Substance and Sexual  Activity   Alcohol use: Never   Drug use: Never   Sexual activity: Never  Other Topics Concern   Not on file  Social History Narrative   Not on file   Social Drivers of Health   Financial Resource Strain: Medium Risk (11/27/2023)   Overall Financial Resource Strain (CARDIA)    Difficulty of Paying Living Expenses: Somewhat hard  Food Insecurity: Food Insecurity Present (11/27/2023)   Hunger Vital Sign    Worried About Running Out of Food in the Last Year: Not on file    Ran Out of Food in the Last Year: Sometimes true  Transportation Needs: No Transportation Needs (11/27/2023)   PRAPARE - Administrator, Civil Service (Medical): No    Lack of Transportation (Non-Medical): No  Physical Activity: Inactive (11/27/2023)   Exercise Vital Sign    Days of Exercise per Week: 0 days    Minutes of Exercise per Session: 0 min  Stress: No Stress Concern Present (11/27/2023)   [employer] of Occupational Health - Occupational Stress Questionnaire    Feeling of Stress : Not at all  Social Connections: Unknown  (11/27/2023)   Social Connection and Isolation Panel    Frequency of Communication with Friends and Family: More than three times a week    Frequency of Social Gatherings with Friends and Family: More than three times a week    Attends Religious Services: Never    Database administrator or Organizations: No    Attends Banker Meetings: Never    Marital Status: Patient declined  Intimate Partner Violence: Not At Risk (03/17/2023)   Humiliation, Afraid, Rape, and Kick questionnaire    Fear of Current or Ex-Partner: No    Emotionally Abused: No    Physically Abused: No    Sexually Abused: No    Past Surgical History:  Procedure Laterality Date   AV FISTULA PLACEMENT Right 07/26/2024   Procedure: ARTERIOVENOUS (AV) FISTULA CREATION;  Surgeon: Jama Cordella MATSU, MD;  Location: [facility] ORS;  Service: Vascular;  Laterality: Right;  RADIALCEPHALIC   LEFT HEART CATH AND CORONARY ANGIOGRAPHY N/A 01/20/2021   Procedure: LEFT HEART CATH AND CORONARY ANGIOGRAPHY;  Surgeon: Mady Bruckner, MD;  Location: [facility] IN[facility];  Service: Cardiovascular;  Laterality: N/A;    Family History  Problem Relation Age of Onset   Hypertension Mother    Kidney disease Mother    Heart Problems Maternal Grandmother    Heart attack Maternal Grandmother    Heart disease Maternal Grandmother     No Known Allergies     Latest Ref Rng & Units 03/29/2023   10:39 AM 11/01/2022    3:55 PM 05/18/2022   10:45 AM  CBC  WBC 4.0 - 10.5 K/uL 9.4  8.8  9.6   Hemoglobin 13.0 - 17.0 g/dL 88.9  88.1  89.6   Hematocrit 39.0 - 52.0 % 36.7  40.6  34.0   Platelets 150 - 400 K/uL 290  425  370       CMP     Component Value Date/Time   NA 147 (H) 11/29/2023 0901   K 4.4 11/29/2023 0901   CL 115 (H) 11/29/2023 0901   CO2 16 (L) 11/29/2023 0901   GLUCOSE 105 (H) 11/29/2023 0901   GLUCOSE 101 (H) 03/29/2023 1039   BUN 46 (H) 11/29/2023 0901   CREATININE 3.00 (H) 11/29/2023 0901   CALCIUM  8.4 (L)  11/29/2023 0901   PROT 5.9 (L) 11/29/2023 0901  ALBUMIN 3.5 (L) 11/29/2023 0901   AST 12 11/29/2023 0901   ALT 12 11/29/2023 0901   ALKPHOS 110 11/29/2023 0901   BILITOT 0.2 11/29/2023 0901   EGFR 27 (L) 11/29/2023 0901   GFRNONAA 31 (L) 03/29/2023 1039     No results found.     Assessment & Plan:   1. ESRD (end stage renal disease) (HCC) (Primary) The patient is a very good thrill and bruit of his right radiocephalic AV fistula but it has been placed for less than 4 weeks.  Typically with the fistula would prefer to have it mature for use after 8 to 12 weeks.  His incisions are also still healing.  Will have him return in 6 to 8 weeks to reevaluate the fistula and the wound healing to determine if it is adequate these.   Current Outpatient Medications on File Prior to Visit  Medication Sig Dispense Refill   albuterol  (VENTOLIN  HFA) 108 (90 Base) MCG/ACT inhaler INHALE 1-2 PUFFS INTO THE LUNGS EVERY SIX HOURS AS NEEDED FOR WHEEZING OR SHORTNESS OF BREATH. 18 g 1   allopurinol  (ZYLOPRIM ) 100 MG tablet Take 1 tablet (100 mg total) by mouth daily. 30 tablet 11   aspirin  81 MG chewable tablet Chew 1 tablet (81 mg total) by mouth daily. 100 tablet 0   atorvastatin  (LIPITOR ) 80 MG tablet Take 1 tablet (80 mg total) by mouth daily. 90 tablet 1   blood glucose meter kit and supplies KIT Dispense based on patient and insurance preference. Use up to four times daily as directed. 1 each 0   calcitRIOL  (ROCALTROL ) 0.25 MCG capsule Take 1 capsule (0.25 mcg total) by mouth daily. 30 capsule 11   calcitRIOL  (ROCALTROL ) 0.25 MCG capsule Take 1 capsule (0.25 mcg total) by mouth daily. 90 capsule 3   carvedilol  (COREG ) 25 MG tablet Take 2 tablets (50 mg total) by mouth 2 (two) times daily.     cloNIDine  (CATAPRES ) 0.1 MG tablet Take 2 tablets (0.2 mg total) by mouth every morning AND 2 tablets (0.2 mg total) at bedtime. 120 tablet 11   dapagliflozin  propanediol (FARXIGA ) 10 MG TABS tablet Take 1  tablet (10 mg total) by mouth in the morning. 30 tablet 11   famotidine  (PEPCID ) 20 MG tablet TAKE 1 TABLET (20 MG TOTAL) BY MOUTH DAILY. 90 tablet 2   ferrous sulfate  325 (65 FE) MG tablet Take 1 tablet (325 mg total) by mouth daily with breakfast. 90 tablet 3   hydrALAZINE  (APRESOLINE ) 100 MG tablet Take 1 tablet (100 mg total) by mouth 3 (three) times daily. 270 tablet 1   Insulin  Glargine (BASAGLAR  KWIKPEN) 100 UNIT/ML Inject 16 Units into the skin daily. 6 mL 2   insulin  lispro (HUMALOG  KWIKPEN) 100 UNIT/ML KwikPen Inject 8 Units into the skin 3 (three) times daily. 6 mL 6   Insulin  Pen Needle 31G X 6 MM MISC Use as directed with breakfast, with lunch, and with evening meal. 120 each 6   isosorbide  mononitrate (IMDUR ) 120 MG 24 hr tablet Take 1 tablet (120 mg total) by mouth daily. 90 tablet 1   Multiple Vitamin (MULTIVITAMIN WITH MINERALS) TABS tablet Take 1 tablet by mouth daily.     omeprazole  (PRILOSEC) 40 MG capsule Take 1 capsule (40 mg total) by mouth daily. 90 capsule 1   potassium chloride  (KLOR-CON ) 10 MEQ tablet Take 1 tablet (10 mEq total) by mouth 2 (two) times daily. 180 tablet 1   Semaglutide , 2 MG/DOSE, (OZEMPIC , 2 MG/DOSE,) 8  MG/3ML SOPN Inject 2 mg as directed once a week. 3 mL 6   sodium bicarbonate  650 MG tablet Take 2 tablets (1,300 mg total) by mouth in the morning AND 2 tablets (1,300 mg total) every evening. 120 tablet 11   torsemide  (DEMADEX ) 20 MG tablet Take 1 tablet (20 mg total) by mouth daily. 90 tablet 1   HYDROcodone -acetaminophen  (NORCO/VICODIN) 5-325 MG tablet Take 1-2 tablets by mouth every 6 (six) hours as needed for moderate pain (pain score 4-6) or severe pain (pain score 7-10). (Patient not taking: Reported on 08/15/2024) 30 tablet 0   No current facility-administered medications on file prior to visit.    There are no Patient Instructions on file for this visit. No follow-ups on file.   Tsugio Elison E Hommer Cunliffe, NP

## 2024-09-09 ENCOUNTER — Other Ambulatory Visit: Payer: Self-pay

## 2024-09-16 ENCOUNTER — Other Ambulatory Visit: Payer: Self-pay

## 2024-09-16 MED ORDER — LOSARTAN POTASSIUM 25 MG PO TABS
25.0000 mg | ORAL_TABLET | Freq: Every day | ORAL | 11 refills | Status: AC
  Filled 2024-09-16: qty 30, 30d supply, fill #0

## 2024-09-23 ENCOUNTER — Other Ambulatory Visit: Payer: Self-pay | Admitting: Family Medicine

## 2024-09-23 DIAGNOSIS — N1832 Chronic kidney disease, stage 3b: Secondary | ICD-10-CM

## 2024-09-23 DIAGNOSIS — E785 Hyperlipidemia, unspecified: Secondary | ICD-10-CM

## 2024-09-24 ENCOUNTER — Other Ambulatory Visit: Payer: Self-pay

## 2024-09-24 MED ORDER — TORSEMIDE 20 MG PO TABS
20.0000 mg | ORAL_TABLET | Freq: Every day | ORAL | 0 refills | Status: AC
  Filled 2024-09-24: qty 30, 30d supply, fill #0

## 2024-09-24 MED ORDER — ATORVASTATIN CALCIUM 80 MG PO TABS
80.0000 mg | ORAL_TABLET | Freq: Every day | ORAL | 0 refills | Status: AC
  Filled 2024-09-24: qty 30, 30d supply, fill #0

## 2024-09-25 ENCOUNTER — Other Ambulatory Visit: Payer: Self-pay

## 2024-09-30 ENCOUNTER — Encounter (INDEPENDENT_AMBULATORY_CARE_PROVIDER_SITE_OTHER): Payer: Self-pay | Admitting: Nurse Practitioner

## 2024-09-30 ENCOUNTER — Ambulatory Visit (INDEPENDENT_AMBULATORY_CARE_PROVIDER_SITE_OTHER): Payer: Self-pay | Admitting: Nurse Practitioner

## 2024-09-30 VITALS — BP 165/105 | HR 99 | Resp 18 | Ht 70.0 in | Wt >= 6400 oz

## 2024-09-30 DIAGNOSIS — N184 Chronic kidney disease, stage 4 (severe): Secondary | ICD-10-CM

## 2024-09-30 NOTE — Progress Notes (Signed)
 Subjective:    Patient ID: Erik [name], male    DOB: 1991/09/01, 33 y.o.   MRN: [id number] Chief Complaint  Patient presents with   Follow-up    6 month follow up R arm fistula    The patient returns today for evaluation of his right radiocephalic fistula that was placed on 07/26/2024.  He underwent a duplex which showed a flow volume of 1328.  Today the incision is much improved from previous scabbing that he had.  He has a very good thrill and bruit but fistula is not significantly prominent but easily palpable.    Review of Systems  Skin:  Positive for wound.  All other systems reviewed and are negative.      Objective:   Physical Exam Vitals reviewed.  HENT:     Head: Normocephalic.  Cardiovascular:     Rate and Rhythm: Normal rate.     Pulses: Normal pulses.  Pulmonary:     Effort: Pulmonary effort is normal.  Skin:    General: Skin is warm and dry.  Neurological:     Mental Status: He is alert and oriented to person, place, and time.  Psychiatric:        Mood and Affect: Mood normal.        Behavior: Behavior normal.        Thought Content: Thought content normal.        Judgment: Judgment normal.     BP (!) 165/105 (BP Location: Left Arm, Patient Position: Sitting, Cuff Size: Large)   Pulse 99   Resp 18   Ht 5' 10 (1.778 m)   Wt (!) 466 lb (211.4 kg)   BMI 66.86 kg/m   Past Medical History:  Diagnosis Date   ARDS survivor    a. 01/2021 in setting of COVID PNA.   Asthma    CKD (chronic kidney disease), stage IV (HCC)    a.) AKI 01/2021 in setting of COVID; initiated on CRRT during extended admission   COVID-19 virus infection 01/2021   Heart failure with improved ejection fraction (HFimpEF) (HCC)    a. 01/2021 LV gram: EF 35-45%; b. 01/2021 Echo: EF 60-65%; c. 09/2021 Echo: EF 55% (51.8% 2D MOD biplane).   HLD (hyperlipidemia)    HTN (hypertension)    Incomplete RBBB    Insulin  dependent type 2 diabetes mellitus (HCC)    Long-term use of aspirin   therapy    Microcytic anemia    Myocarditis (HCC)    a. 01/2021 in setting of COVID PNA.   NICM (nonischemic cardiomyopathy) (HCC)    a. 01/2021 LV gram: EF 35-45%; b. 01/2021 Echo: EF 60-65%, no rwma, nl RV fxn; c. 09/2021 Echo: EF 55% (51.8% 2D MOD biplane). No rwma, nl RV fxn w/ mild enlargement.   Non-obstructive CAD (coronary artery disease)    a. 01/2021 Cath: LM nl, LAD nl, RI nl, LCX nl, LPAV mild dzs, RCA nl, RPDA min irregs. EF 35-45%.   ST elevation myocardial infarction (STEMI) (HCC)    Super-super obese (HCC)    T2DM (type 2 diabetes mellitus) (HCC)    Ventricular tachycardia (HCC)    a. 01/2021 in setting of myocarditis/COVID.    Social History   Socioeconomic History   Marital status: Single    Spouse name: Not on file   Number of children: Not on file   Years of education: Not on file   Highest education level: Not on file  Occupational History   Not on file  Tobacco Use   Smoking status: Never   Smokeless tobacco: Never  Vaping Use   Vaping status: Never Used  Substance and Sexual Activity   Alcohol use: Never   Drug use: Never   Sexual activity: Never  Other Topics Concern   Not on file  Social History Narrative   Not on file   Social Drivers of Health   Financial Resource Strain: Medium Risk (11/27/2023)   Overall Financial Resource Strain (CARDIA)    Difficulty of Paying Living Expenses: Somewhat hard  Food Insecurity: Food Insecurity Present (11/27/2023)   Hunger Vital Sign    Worried About Running Out of Food in the Last Year: Not on file    Ran Out of Food in the Last Year: Sometimes true  Transportation Needs: No Transportation Needs (11/27/2023)   PRAPARE - Administrator, Civil Service (Medical): No    Lack of Transportation (Non-Medical): No  Physical Activity: Inactive (11/27/2023)   Exercise Vital Sign    Days of Exercise per Week: 0 days    Minutes of Exercise per Session: 0 min  Stress: No Stress Concern Present (11/27/2023)    [employer] of Occupational Health - Occupational Stress Questionnaire    Feeling of Stress : Not at all  Social Connections: Unknown (11/27/2023)   Social Connection and Isolation Panel    Frequency of Communication with Friends and Family: More than three times a week    Frequency of Social Gatherings with Friends and Family: More than three times a week    Attends Religious Services: Never    Database administrator or Organizations: No    Attends Banker Meetings: Never    Marital Status: Patient declined  Intimate Partner Violence: Not At Risk (03/17/2023)   Humiliation, Afraid, Rape, and Kick questionnaire    Fear of Current or Ex-Partner: No    Emotionally Abused: No    Physically Abused: No    Sexually Abused: No    Past Surgical History:  Procedure Laterality Date   AV FISTULA PLACEMENT Right 07/26/2024   Procedure: ARTERIOVENOUS (AV) FISTULA CREATION;  Surgeon: Jama Cordella MATSU, MD;  Location: [facility] ORS;  Service: Vascular;  Laterality: Right;  RADIALCEPHALIC   LEFT HEART CATH AND CORONARY ANGIOGRAPHY N/A 01/20/2021   Procedure: LEFT HEART CATH AND CORONARY ANGIOGRAPHY;  Surgeon: Mady Bruckner, MD;  Location: [facility] INVASIVE CV LAB;  Service: Cardiovascular;  Laterality: N/A;    Family History  Problem Relation Age of Onset   Hypertension Mother    Kidney disease Mother    Heart Problems Maternal Grandmother    Heart attack Maternal Grandmother    Heart disease Maternal Grandmother     No Known Allergies     Latest Ref Rng & Units 03/29/2023   10:39 AM 11/01/2022    3:55 PM 05/18/2022   10:45 AM  CBC  WBC 4.0 - 10.5 K/uL 9.4  8.8  9.6   Hemoglobin 13.0 - 17.0 g/dL 88.9  88.1  89.6   Hematocrit 39.0 - 52.0 % 36.7  40.6  34.0   Platelets 150 - 400 K/uL 290  425  370       CMP     Component Value Date/Time   NA 147 (H) 11/29/2023 0901   K 4.4 11/29/2023 0901   CL 115 (H) 11/29/2023 0901   CO2 16 (L) 11/29/2023 0901   GLUCOSE 105 (H)  11/29/2023 0901   GLUCOSE 101 (H) 03/29/2023 1039   BUN 46 (  H) 11/29/2023 0901   CREATININE 3.00 (H) 11/29/2023 0901   CALCIUM  8.4 (L) 11/29/2023 0901   PROT 5.9 (L) 11/29/2023 0901   ALBUMIN 3.5 (L) 11/29/2023 0901   AST 12 11/29/2023 0901   ALT 12 11/29/2023 0901   ALKPHOS 110 11/29/2023 0901   BILITOT 0.2 11/29/2023 0901   EGFR 27 (L) 11/29/2023 0901   GFRNONAA 31 (L) 03/29/2023 1039     No results found.     Assessment & Plan:   1. ESRD (end stage renal disease) (HCC) (Primary) The patient is a very good thrill and bruit of his right radiocephalic AV fistula.  He currently is not on dialysis and currently there are no imminent plans for him to begin.  Given this there is enough time for this to possibly mature.  Will plan on having him return in 6 months does not begin dialysis in the interim.  She does not require dialysis in the interim we have recommended he contact us  for evaluation of his fistula before he begins to utilize it.   Current Outpatient Medications on File Prior to Visit  Medication Sig Dispense Refill   albuterol  (VENTOLIN  HFA) 108 (90 Base) MCG/ACT inhaler INHALE 1-2 PUFFS INTO THE LUNGS EVERY SIX HOURS AS NEEDED FOR WHEEZING OR SHORTNESS OF BREATH. 18 g 1   allopurinol  (ZYLOPRIM ) 100 MG tablet Take 1 tablet (100 mg total) by mouth daily. 30 tablet 11   aspirin  81 MG chewable tablet Chew 1 tablet (81 mg total) by mouth daily. 100 tablet 0   atorvastatin  (LIPITOR ) 80 MG tablet Take 1 tablet (80 mg total) by mouth daily.Must have office visit for refills 30 tablet 0   blood glucose meter kit and supplies KIT Dispense based on patient and insurance preference. Use up to four times daily as directed. 1 each 0   calcitRIOL  (ROCALTROL ) 0.25 MCG capsule Take 1 capsule (0.25 mcg total) by mouth daily. 30 capsule 11   calcitRIOL  (ROCALTROL ) 0.25 MCG capsule Take 1 capsule (0.25 mcg total) by mouth daily. 90 capsule 3   carvedilol  (COREG ) 25 MG tablet Take 2 tablets (50  mg total) by mouth 2 (two) times daily.     cloNIDine  (CATAPRES ) 0.1 MG tablet Take 2 tablets (0.2 mg total) by mouth every morning AND 2 tablets (0.2 mg total) at bedtime. 120 tablet 11   dapagliflozin  propanediol (FARXIGA ) 10 MG TABS tablet Take 1 tablet (10 mg total) by mouth in the morning. 30 tablet 11   famotidine  (PEPCID ) 20 MG tablet TAKE 1 TABLET (20 MG TOTAL) BY MOUTH DAILY. 90 tablet 2   ferrous sulfate  325 (65 FE) MG tablet Take 1 tablet (325 mg total) by mouth daily with breakfast. 90 tablet 3   hydrALAZINE  (APRESOLINE ) 100 MG tablet Take 1 tablet (100 mg total) by mouth 3 (three) times daily. 270 tablet 1   HYDROcodone -acetaminophen  (NORCO/VICODIN) 5-325 MG tablet Take 1-2 tablets by mouth every 6 (six) hours as needed for moderate pain (pain score 4-6) or severe pain (pain score 7-10). 30 tablet 0   Insulin  Glargine (BASAGLAR  KWIKPEN) 100 UNIT/ML Inject 16 Units into the skin daily. 6 mL 2   insulin  lispro (HUMALOG  KWIKPEN) 100 UNIT/ML KwikPen Inject 8 Units into the skin 3 (three) times daily. 6 mL 6   Insulin  Pen Needle 31G X 6 MM MISC Use as directed with breakfast, with lunch, and with evening meal. 120 each 6   isosorbide  mononitrate (IMDUR ) 120 MG 24 hr tablet Take 1 tablet (  120 mg total) by mouth daily. 90 tablet 1   losartan (COZAAR) 25 MG tablet Take 1 tablet (25 mg total) by mouth daily. 30 tablet 11   Multiple Vitamin (MULTIVITAMIN WITH MINERALS) TABS tablet Take 1 tablet by mouth daily.     omeprazole  (PRILOSEC) 40 MG capsule Take 1 capsule (40 mg total) by mouth daily. 90 capsule 1   Semaglutide , 2 MG/DOSE, (OZEMPIC , 2 MG/DOSE,) 8 MG/3ML SOPN Inject 2 mg as directed once a week. 3 mL 6   sodium bicarbonate  650 MG tablet Take 2 tablets (1,300 mg total) by mouth in the morning AND 2 tablets (1,300 mg total) every evening. 120 tablet 11   torsemide  (DEMADEX ) 20 MG tablet Take 1 tablet (20 mg total) by mouth daily. 30 tablet 0   potassium chloride  (KLOR-CON ) 10 MEQ tablet  Take 1 tablet (10 mEq total) by mouth 2 (two) times daily. (Patient not taking: Reported on 09/30/2024) 180 tablet 1   No current facility-administered medications on file prior to visit.    There are no Patient Instructions on file for this visit. No follow-ups on file.   Teigen Bellin E Haidar Muse, NP

## 2024-10-03 ENCOUNTER — Other Ambulatory Visit: Payer: Self-pay

## 2024-10-30 ENCOUNTER — Ambulatory Visit: Payer: Self-pay | Attending: Internal Medicine | Admitting: Internal Medicine

## 2024-10-30 ENCOUNTER — Encounter: Payer: Self-pay | Admitting: Internal Medicine

## 2024-10-30 VITALS — BP 121/76 | HR 86 | Ht 70.0 in | Wt >= 6400 oz

## 2024-10-30 DIAGNOSIS — Z8679 Personal history of other diseases of the circulatory system: Secondary | ICD-10-CM | POA: Insufficient documentation

## 2024-10-30 DIAGNOSIS — I428 Other cardiomyopathies: Secondary | ICD-10-CM | POA: Insufficient documentation

## 2024-10-30 DIAGNOSIS — I5020 Unspecified systolic (congestive) heart failure: Secondary | ICD-10-CM | POA: Insufficient documentation

## 2024-10-30 DIAGNOSIS — Z794 Long term (current) use of insulin: Secondary | ICD-10-CM

## 2024-10-30 DIAGNOSIS — N184 Chronic kidney disease, stage 4 (severe): Secondary | ICD-10-CM

## 2024-10-30 DIAGNOSIS — I10 Essential (primary) hypertension: Secondary | ICD-10-CM

## 2024-10-30 DIAGNOSIS — E118 Type 2 diabetes mellitus with unspecified complications: Secondary | ICD-10-CM | POA: Insufficient documentation

## 2024-10-30 NOTE — Patient Instructions (Signed)
 Medication Instructions:  Your physician recommends that you continue on your current medications as directed. Please refer to the Current Medication list given to you today.  *If you need a refill on your cardiac medications before your next appointment, please call your pharmacy*  Lab Work: No labs ordered today  If you have labs (blood work) drawn today and your tests are completely normal, you will receive your results only by: MyChart Message (if you have MyChart) OR A paper copy in the mail If you have any lab test that is abnormal or we need to change your treatment, we will call you to review the results.  Testing/Procedures: No test ordered today   Follow-Up: At [facility], you and your health needs are our priority.  As part of our continuing mission to provide you with exceptional heart care, our providers are all part of one team.  This team includes your primary Cardiologist (physician) and Advanced Practice Providers or APPs (Physician Assistants and Nurse Practitioners) who all work together to provide you with the care you need, when you need it.  Your next appointment:   6 month(s)  Provider:   You may see Sammy Crisp, MD or one of the following Advanced Practice Providers on your designated Care Team:   Laneta Pintos, NP Gildardo Labrador, PA-C Varney Gentleman, PA-C Cadence Rewey, PA-C Ronald Cockayne, NP Morey Ar, NP    We recommend signing up for the patient portal called "MyChart".  Sign up information is provided on this After Visit Summary.  MyChart is used to connect with patients for Virtual Visits (Telemedicine).  Patients are able to view lab/test results, encounter notes, upcoming appointments, etc.  Non-urgent messages can be sent to your provider as well.   To learn more about what you can do with MyChart, go to [email or web address].

## 2024-10-30 NOTE — Progress Notes (Signed)
 Cardiology Office Note:  .   Date:  10/30/2024  ID:  Erik [name], DOB 1991/02/10, MRN [id number] PCP: Delbert Clam, MD  [facility] Providers Cardiologist:  Lonni Hanson, MD     History of Present Illness: .   Erik [name] is a 33 y.o. male with history of heart failure with recovered ejection fraction due to nonischemic cardiomyopathy (LVEF initially 35-45% by left ventriculogram, improved to 60-65% on subsequent echocardiogram) complicated by ventricular tachycardia in the setting of COVID-19 with ARDS and acute kidney injury, minimal nonobstructive coronary artery disease, type 2 diabetes mellitus, and morbid obesity, who presents for follow-up of cardiomyopathy and hypertension.  He was last seen in our office in August by Medford Meager, NP, at which time he was feeling fairly well.  Blood pressure was mildly elevated at 148/89, though he noted that he had not taken his morning blood pressure meds.  He was planning to undergo AV fistula creation in anticipation of needing to start dialysis.  Today, Erik [name] reports that he has been doing quite well.  He has stable edema, predominantly in his abdomen.  He does not weigh himself regularly at home.  He is using torsemide  20 mg daily and continues to follow closely with nephrology.  He does not believe that he will need to start dialysis imminently.  He has not had any chest pain, shortness of breath, palpitations, or lightheadedness.  He does not check his blood pressure regularly at home, though it has been elevated at other provider visits over the last few months.  ROS: See HPI  Studies Reviewed: SABRA   EKG Interpretation Date/Time:  Wednesday October 30 2024 09:48:20 EST Ventricular Rate:  86 PR Interval:  160 QRS Duration:  102 QT Interval:  372 QTC Calculation: 445 R Axis:   -3  Text Interpretation: Normal sinus rhythm Cannot rule out Anterior infarct , age undetermined versus lead placement When compared with ECG of  24-Jul-2024 15:15, No significant change was found Confirmed by Ashmi Blas, Lonni [phone]) on 10/30/2024 9:53:43 AM     Risk Assessment/Calculations:             Physical Exam:   VS:  BP 121/76 (BP Location: Left Wrist, Patient Position: Sitting, Cuff Size: Normal)   Pulse 86 Comment: 88 OXIMETER  Ht 5' 10 (1.778 m)   Wt (!) 469 lb 3.2 oz (212.8 kg)   SpO2 97%   BMI 67.32 kg/m    Wt Readings from Last 3 Encounters:  10/30/24 (!) 469 lb 3.2 oz (212.8 kg)  09/30/24 (!) 466 lb (211.4 kg)  08/15/24 (!) 479 lb (217.3 kg)    General:  NAD. Neck: Unable to assess JVP due to body habitus. Lungs: Clear to auscultation bilaterally without wheezes or crackles. Heart: Regular rate and rhythm without murmurs, rubs, or gallops. Abdomen: Obese but soft, nontender, nondistended.   Extremities: Trace pretibial edema, right greater than left.  Right forearm AV fistula with palpable thrill.  ASSESSMENT AND PLAN: .    Heart failure with improved ejection fraction due to nonischemic cardiomyopathy: Volume exam is very difficult due to Erik [name] morbid obesity.  However, he appears grossly euvolemic with fairly stable weight.  Blood pressure is well-controlled today.  Continue current medications, including torsemide  20 mg daily with ongoing titration per nephrology.  Most recent labs last month showed stable renal function and electrolytes.  History of ventricular tachycardia: No palpitations reported.  EKG today shows normal sinus rhythm.  Continue carvedilol .  Hypertension: Blood pressure has been somewhat elevated and difficult to control in the past though his BP is normal today.  I will defer changes to his current regimen consisting of carvedilol , clonidine , hydralazine , isosorbide  mononitrate, and losartan.  Defer further management to PCP and nephrology.  Chronic kidney disease stage IV: Right forearm AV fistula in place with no imminent plans to initiate hemodialysis.  Continue current  medications and close follow-up with nephrology.  Type 2 diabetes mellitus: Continue current medications.  Ongoing management per PCP.  Morbid obesity: BMI greater than 65 with multiple comorbidities.  Importance of weight loss through diet and exercise reinforced.    Dispo: Return to clinic in 6 months.  Signed, Lonni Hanson, MD

## 2024-11-05 ENCOUNTER — Other Ambulatory Visit: Payer: Self-pay

## 2024-11-25 ENCOUNTER — Other Ambulatory Visit: Payer: Self-pay

## 2025-03-31 ENCOUNTER — Ambulatory Visit (INDEPENDENT_AMBULATORY_CARE_PROVIDER_SITE_OTHER): Payer: Self-pay | Admitting: Nurse Practitioner

## 2025-03-31 ENCOUNTER — Encounter (INDEPENDENT_AMBULATORY_CARE_PROVIDER_SITE_OTHER): Payer: Self-pay
